# Patient Record
Sex: Male | Born: 1955
Health system: Southern US, Community
[De-identification: ages and names within clinical notes are randomized; demographics above are authoritative.]

## PROBLEM LIST (undated history)

## (undated) DIAGNOSIS — E785 Hyperlipidemia, unspecified: Secondary | ICD-10-CM

## (undated) DIAGNOSIS — S8290XA Unspecified fracture of unspecified lower leg, initial encounter for closed fracture: Secondary | ICD-10-CM

## (undated) DIAGNOSIS — Z2820 Immunization not carried out because of patient decision for unspecified reason: Secondary | ICD-10-CM

## (undated) DIAGNOSIS — Z5320 Procedure and treatment not carried out because of patient's decision for unspecified reasons: Secondary | ICD-10-CM

## (undated) DIAGNOSIS — C9000 Multiple myeloma not having achieved remission: Secondary | ICD-10-CM

## (undated) DIAGNOSIS — J189 Pneumonia, unspecified organism: Secondary | ICD-10-CM

## (undated) DIAGNOSIS — I10 Essential (primary) hypertension: Secondary | ICD-10-CM

## (undated) HISTORY — DX: Unspecified fracture of unspecified lower leg, initial encounter for closed fracture: S82.90XA

## (undated) HISTORY — DX: Hyperlipidemia, unspecified: E78.5

## (undated) HISTORY — DX: Immunization not carried out because of patient decision for unspecified reason: Z28.20

## (undated) HISTORY — PX: COLONOSCOPY: SHX174

## (undated) HISTORY — DX: Essential (primary) hypertension: I10

## (undated) HISTORY — DX: Procedure and treatment not carried out because of patient's decision for unspecified reasons: Z53.20

## (undated) HISTORY — DX: Multiple myeloma not having achieved remission: C90.00

## (undated) HISTORY — DX: Pneumonia, unspecified organism: J18.9

---

## 2017-04-11 ENCOUNTER — Ambulatory Visit (INDEPENDENT_AMBULATORY_CARE_PROVIDER_SITE_OTHER): Payer: No Typology Code available for payment source | Admitting: Medical

## 2017-04-11 ENCOUNTER — Encounter: Payer: Self-pay | Admitting: Medical

## 2017-04-11 VITALS — BP 138/76 | HR 52 | Ht 68.0 in | Wt 186.6 lb

## 2017-04-11 DIAGNOSIS — E785 Hyperlipidemia, unspecified: Secondary | ICD-10-CM | POA: Diagnosis not present

## 2017-04-11 DIAGNOSIS — E559 Vitamin D deficiency, unspecified: Secondary | ICD-10-CM | POA: Diagnosis not present

## 2017-04-11 DIAGNOSIS — I10 Essential (primary) hypertension: Secondary | ICD-10-CM

## 2017-04-11 MED ORDER — AMLODIPINE BESYLATE 10 MG PO TABS: 10.0000 mg | ORAL_TABLET | Freq: Every day | ORAL | 1 refills | Status: DC

## 2017-04-11 MED ORDER — METOPROLOL TARTRATE 100 MG PO TABS: 100.0000 mg | ORAL_TABLET | Freq: Two times a day (BID) | ORAL | 1 refills | Status: DC
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
## 2017-04-12 LAB — CBC
HEMATOCRIT: 42.2 % (ref 38.5–50.0)
HEMOGLOBIN: 14.5 g/dL (ref 13.2–17.1)
MCH: 27.2 pg (ref 27.0–33.0)
MCHC: 34.4 g/dL (ref 32.0–36.0)
MCV: 79.2 fL — AB (ref 80.0–100.0)
MPV: 12.2 fL (ref 7.5–12.5)
Platelets: 226 10*3/uL (ref 140–400)
RBC: 5.33 10*6/uL (ref 4.20–5.80)
RDW: 13.3 % (ref 11.0–15.0)
WBC: 10.1 10*3/uL (ref 3.8–10.8)

## 2017-04-12 LAB — COMPREHENSIVE METABOLIC PANEL
AG RATIO: 1.4 (calc) (ref 1.0–2.5)
ALT: 13 U/L (ref 9–46)
AST: 16 U/L (ref 10–35)
Albumin: 4 g/dL (ref 3.6–5.1)
Alkaline phosphatase (APISO): 79 U/L (ref 40–115)
BUN: 15 mg/dL (ref 7–25)
CO2: 23 mmol/L (ref 20–32)
CREATININE: 1.03 mg/dL (ref 0.70–1.25)
Calcium: 8.6 mg/dL (ref 8.6–10.3)
Chloride: 102 mmol/L (ref 98–110)
GLUCOSE: 124 mg/dL — AB (ref 65–99)
Globulin: 2.9 g/dL (calc) (ref 1.9–3.7)
Potassium: 4.2 mmol/L (ref 3.5–5.3)
Sodium: 138 mmol/L (ref 135–146)
TOTAL PROTEIN: 6.9 g/dL (ref 6.1–8.1)
Total Bilirubin: 1.8 mg/dL — ABNORMAL HIGH (ref 0.2–1.2)

## 2017-04-12 LAB — VITAMIN D 25 HYDROXY (VIT D DEFICIENCY, FRACTURES): VIT D 25 HYDROXY: 45 ng/mL (ref 30–100)

## 2017-04-12 LAB — HEMOGLOBIN A1C
EAG (MMOL/L): 6.3 (calc)
Hgb A1c MFr Bld: 5.6 % of total Hgb (ref <5.7)
MEAN PLASMA GLUCOSE: 114 (calc)

## 2017-04-15 ENCOUNTER — Telehealth: Payer: Self-pay

## 2017-04-15 NOTE — Telephone Encounter (Signed)
Pt was notified of lab results. Pt said that he will call back to make an appt  For appt.

## 2017-09-04 ENCOUNTER — Other Ambulatory Visit: Payer: Self-pay | Admitting: Medical

## 2017-09-13 DIAGNOSIS — Z5320 Procedure and treatment not carried out because of patient's decision for unspecified reasons: Secondary | ICD-10-CM

## 2017-09-13 HISTORY — DX: Procedure and treatment not carried out because of patient's decision for unspecified reasons: Z53.20

## 2017-10-02 ENCOUNTER — Other Ambulatory Visit: Payer: Self-pay | Admitting: Medical

## 2017-10-02 NOTE — Telephone Encounter (Signed)
LOV-04/11/17, no other appointments.  Okay to refill?  Thanks!

## 2017-10-02 NOTE — Telephone Encounter (Signed)
Send in refill but get him in for physical visit .

## 2017-10-03 ENCOUNTER — Telehealth: Payer: Self-pay

## 2017-10-03 NOTE — Telephone Encounter (Signed)
Called and advised patient to keep next appointment for further refills on his medications.

## 2017-10-10 ENCOUNTER — Ambulatory Visit: Payer: Commercial Managed Care - PPO | Admitting: Medical

## 2017-10-10 ENCOUNTER — Encounter: Payer: Self-pay | Admitting: Medical

## 2017-10-10 VITALS — BP 160/88 | Temp 97.7°F | Wt 191.0 lb

## 2017-10-10 DIAGNOSIS — I10 Essential (primary) hypertension: Secondary | ICD-10-CM | POA: Diagnosis not present

## 2017-10-10 DIAGNOSIS — Z2820 Immunization not carried out because of patient decision for unspecified reason: Secondary | ICD-10-CM | POA: Diagnosis not present

## 2017-10-10 DIAGNOSIS — Z5320 Procedure and treatment not carried out because of patient's decision for unspecified reasons: Secondary | ICD-10-CM | POA: Diagnosis not present

## 2017-10-10 MED ORDER — AMLODIPINE BESYLATE 10 MG PO TABS: 10.0000 mg | ORAL_TABLET | Freq: Every day | ORAL | 3 refills | Status: DC

## 2017-10-10 MED ORDER — METOPROLOL TARTRATE 100 MG PO TABS: 100.0000 mg | ORAL_TABLET | Freq: Two times a day (BID) | ORAL | 3 refills | Status: DC

## 2017-10-10 NOTE — Progress Notes (Signed)
Subjective: Chief Complaint  Patient presents with  . Medication Management   Here for med check.  Irritated he has to be here today.  Was upset that nurse checked his BP in both arms, used a curse word.  He is feeling fine, no chest pain, no dyspnea, no edema, no paresthesia.  No c/o.  He denies eating junk food.  Eats variety of fruits and vegetables.   Limits salt, doesn't drink or smoke  He declines screenings, vaccines.    He reports that if he were to get cancer or die of heart attack that is ok as he knows he would go to heaven to meet the [place].  Td less than 10 years ago.  No other aggravating or relieving factors.    No other c/o.  The following portions of the patient's history were reviewed and updated as appropriate: allergies, current medications, past family history, past medical history, past social history, past surgical history and problem list.  ROS Otherwise as in subjective above  Past Medical History:  Diagnosis Date  . Hypertension    No current outpatient medications on file prior to visit.   No current facility-administered medications on file prior to visit.      Objective: Temp 97.7 F (36.5 C) (Oral)   Wt 191 lb (86.6 kg)   BMI 29.04 kg/m   General appearance: alert, no distress, well developed, well nourished Declines exam     Assessment: Encounter Diagnoses  Name Primary?  . Essential hypertension, benign Yes  . Vaccine refused by patient   . Colonoscopy refused   . Drug therapy management recommendation refused by patient      Plan: HTN - continue current medication, refilled year's worth of medication at his request.  Reviewed labs from 03/2017. Counseled on vaccines, cancer screening, benefits of preventive care.  He refuses all vaccines recommended including flu, pneumococcal, shingles.  He does note being up to date on Td vaccine He refuses cancer screening. He refuses to come in for more frequent visits or follow ups.    He strongly wants to come in yearly for BP medication refills without a whole lot of other test or things being done. We discussed his views on medical care and he is content with whatever issues may arise as we move forwards.   I advised that cancer screening could allow [place] to catch something early enough to treat, but he doesn't want to do screenings.  I advised that its up to him to agree or not agree with the recommendations of care.  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  He reports home readings as being normal.  He otherwise voices no desire for any significant screening or intervention.     [name] was seen today for medication management.  Diagnoses and all orders for this visit:  Essential hypertension, benign  Vaccine refused by patient  Colonoscopy refused  Drug therapy management recommendation refused by patient  Other orders -     amLODipine (NORVASC) 10 MG tablet; Take 1 tablet (10 mg total) by mouth daily. -     metoprolol tartrate (LOPRESSOR) 100 MG tablet; Take 1 tablet (100 mg total) by mouth 2 (two) times daily.   Follow up: 1 year for physical

## 2018-04-25 ENCOUNTER — Ambulatory Visit: Payer: Commercial Managed Care - PPO | Admitting: Medical

## 2018-04-25 ENCOUNTER — Encounter: Payer: Self-pay | Admitting: Medical

## 2018-04-25 VITALS — BP 150/72

## 2018-04-25 DIAGNOSIS — I10 Essential (primary) hypertension: Secondary | ICD-10-CM | POA: Diagnosis not present

## 2018-04-25 DIAGNOSIS — Z9119 Patient's noncompliance with other medical treatment and regimen: Secondary | ICD-10-CM

## 2018-04-25 DIAGNOSIS — Z5320 Procedure and treatment not carried out because of patient's decision for unspecified reasons: Secondary | ICD-10-CM

## 2018-04-25 DIAGNOSIS — Z91199 Patient's noncompliance with other medical treatment and regimen due to unspecified reason: Secondary | ICD-10-CM

## 2018-04-25 NOTE — Progress Notes (Signed)
Subjective: Chief Complaint  Patient presents with  . HTN    discuss htn    patient refused vital signs   Here for follow-up on blood pressure medication.  He refused vital signs today.  See last visit 10/10/2017 for similar tense visit  Of note, last visit in March 2019 he did not want to come in except for once yearly for blood pressure medicine refills.  He refused general vaccines at that time, refused cancer screening, refused other evaluation.  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  Here for recheck on BP.  Went for DOT physical yesterday, given 3 month extension due to elevated BP.  He has paperwork with him.   HTN - Currently taking Amlodipine 10mg  daily, and taking Metoprolol 100mg  BID.  Checks BPs on Sundays, usually 130/70.    Has his own cuff.  No CP, no dyspna.   No problems, doing fine.   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  He outworked 2 teenagers.   He denies any DOE.   Walks every morning for exercise.  No other c/o.    Past Medical History:  Diagnosis Date  . Colonoscopy refused 09/2017  . Hypertension   . Refuses treatment 09/2017   refuses screenings such as cancer screening, refuses vaccines, refuses normal preventative care  . Vaccine refused by patient    all vaccines as of 09/2017   Current Outpatient Medications on File Prior to Visit  Medication Sig Dispense Refill  . amLODipine (NORVASC) 10 MG tablet Take 1 tablet (10 mg total) by mouth daily. 90 tablet 3  . metoprolol tartrate (LOPRESSOR) 100 MG tablet Take 1 tablet (100 mg total) by mouth 2 (two) times daily. 180 tablet 3   No current facility-administered medications on file prior to visit.    ROS as in subjective   Objective Refused vital signs initially. BP (!) 150/72   I personally measured his blood pressure in both arms, left arm 152/78, left arm, a little bit later in the visit 150/72 right arm  Gen:  wd, wn, nad Heart rrr, normal s1, s2, no murmurs lungs clear Ext: no edema Pulses 2+ symmetric Psych: He has somewhat of a bad attitude, somewhat irritated with the CMA's questions, refused vitals, and he cursed and seemed to be upset we talked about evaluation and recommendations     Assessment: Encounter Diagnoses  Name Primary?  . Essential hypertension, benign Yes  . Noncompliance   . Refused assessment of physical health      Plan: We discussed his concerns, his 8-month DOT temporary car that runs out in early January, we discussed his disdain for evaluation and follow-up.  We had a hard time at the last appointment as well.  He was not agreeable to normal acceptable preventative measures and evaluation and recommendations last visit nor this visit.  He seemed to want me to just sign off his blood pressure was fine that he can get his DOT clearance without any type of evaluation including vital signs whatsoever  I advised that this is not appropriate medical care.  I advised that at a very minimum he should have his kidney markers checked every year, and that we should have a baseline EKG given what may be uncontrolled high blood pressure.  He certainly has never seen a cardiologist nor has any desire to at this time.  He also understands that  if he does not have his blood pressure under control his DOT certificate could be declined  He was not agreeable to add medication today to the 2 medicines he is taking.  I did try to counsel on diet and exercise and low-salt diet.  Interesting before he came in here today knowing he would have a blood pressure check he ate a "hungry man meal" at a local diner including, sausage, country ham, 2 pancakes, 3 eggs.  To come up to some agreeable plan moving forward, I advised he return in a few weeks with his blood pressure cuff so we can compare cuffs here to make sure his is accurate, advised he check his blood pressures 2 or 3 times per week,  write them down and actually bring the recordings such as on the paper here next visit.  I advised that if I have no tangible data in front of me I cannot sign off on his blood pressures.    If his pressures continue to run in the current range we will need to add a third medication.  I checked both arms today and both times he was around the 622 systolic range but normal diastolic range.  This was consistent with blood pressure reading at occupational health a few days ago his DOT physical.  I also advised that if we cannot work together to find some kind of agreement going forward on evaluation and treatment, then it  would be awfully hard to take care of him  Paul Norton was seen today for htn.  Diagnoses and all orders for this visit:  Essential hypertension, benign  Noncompliance  Refused assessment of physical health

## 2018-04-25 NOTE — Patient Instructions (Addendum)
Recommendations  I know it is frustrating to you that your blood pressure is high when you go in for the DOT physical but let us work together with the following recommendations to move forward   Please come back in and see the nurse within 3 to 4 weeks.  At that time bring your blood pressure cuff so we can compare your readings with our readings  At that time bring a list of your blood pressure readings from home.  I recommend you check your blood pressure 2 or 3 days/week and write these numbers down so we can review these.  If I have your blood pressure readings I can put them in the chart, but if you do not bring them is hard to comment on just your verbal word about your blood pressures  I will need to do an EKG on you at some point in the next year.  This is a heart disease screening test.  It is impossible for me to fully take care of you or give advice on your blood pressure heart without doing this test occasionally.  It does not have to be done every year  Likewise you need to have your kidneys checked every year.  This is a blood test to check to see if your kidney function is working okay.  Blood pressure can harm the kidneys as well as the heart.  Going forward I am not asking a lot for you but if you can simply agree to checking your kidneys periodically or checking an EKG when needed then is will be impossible to help treat your blood pressure issues   If your blood pressures are not at goal, less than 140 top number, less than 90 bottom number upon recheck over the next month or 2, then I will need to add a third blood pressure pill

## 2018-06-18 ENCOUNTER — Other Ambulatory Visit: Payer: Self-pay | Admitting: Medical

## 2018-06-18 ENCOUNTER — Encounter: Payer: Self-pay | Admitting: Medical

## 2018-06-18 ENCOUNTER — Ambulatory Visit: Payer: Commercial Managed Care - PPO | Admitting: Medical

## 2018-06-18 VITALS — HR 50 | Temp 97.4°F | Resp 16 | Ht 71.0 in | Wt 190.8 lb

## 2018-06-18 DIAGNOSIS — Z9119 Patient's noncompliance with other medical treatment and regimen: Secondary | ICD-10-CM | POA: Diagnosis not present

## 2018-06-18 DIAGNOSIS — I10 Essential (primary) hypertension: Secondary | ICD-10-CM

## 2018-06-18 DIAGNOSIS — Z91199 Patient's noncompliance with other medical treatment and regimen due to unspecified reason: Secondary | ICD-10-CM

## 2018-06-18 DIAGNOSIS — Z2820 Immunization not carried out because of patient decision for unspecified reason: Secondary | ICD-10-CM | POA: Diagnosis not present

## 2018-06-18 NOTE — Progress Notes (Signed)
error 

## 2018-06-18 NOTE — Progress Notes (Signed)
Subjective: Chief Complaint  Patient presents with  . follow up HTN    follow up HTN    Here for follow-up on blood pressure medication.  He refused BP check by [place] today.  At his recent visit in October for blood pressure check he had been given a 17-month extension by another office for DOT certification to give him a chance to get blood pressure under control.  At his last visit I asked him to bring his cuff which he has today.  He gave me a list of his home readings most of which are within goal less than 140/90.  There are a few outliers that are in the 150/80 range but most are under 140/90.  He denies chest pain, shortness of breath, dyspnea, edema.  He reports compliance with medication.   Currently taking Amlodipine 10mg  daily, and taking Metoprolol 100mg  BID.  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  Even brags about out working all of his younger counterparts.  As prior visits, he complains that he is stressed free and doing fine all throughout the year except in September when he has to come for his DOT certification.  He feels that he has no control however his blood pressure whether or not he passes the DOT which gets him upset.  He also reports whitecoat hypertension.  He reports that he only gets high readings when he comes into the doctor's office.  He denies being overly anxious or angry.  He does not smoke or drink.  [place]  [place]  He voices his frustration that he has to come in here and do this once a year.  No other c/o.    Past Medical History:  Diagnosis Date  . Colonoscopy refused 09/2017  . Hypertension   . Refuses treatment 09/2017   refuses screenings such as cancer screening, refuses vaccines, refuses normal preventative care  . Vaccine refused by patient    all vaccines as of 09/2017   Current Outpatient Medications on File Prior to Visit  Medication Sig Dispense Refill  .  amLODipine (NORVASC) 10 MG tablet Take 1 tablet (10 mg total) by mouth daily. 90 tablet 3  . metoprolol tartrate (LOPRESSOR) 100 MG tablet Take 1 tablet (100 mg total) by mouth 2 (two) times daily. 180 tablet 3   No current facility-administered medications on file prior to visit.    ROS as in subjective   Objective Refused vital signs initially. Pulse (!) 50   Temp (!) 97.4 F (36.3 C) (Oral)   Resp 16   Ht 5\' 11"  (1.803 m)   Wt 190 lb 12.8 oz (86.5 kg)   SpO2 96%   BMI 26.61 kg/m   Gen: wd, wn, nad Declines other exam today    Assessment: Encounter Diagnoses  Name Primary?  . Essential hypertension, benign Yes  . Vaccine refused by patient   . Noncompliance      Plan: See recent prior visit notes for additional history and tone of the visit.  He was at least agreeable to some blood work today to check the kidneys.  Counseled on diet and exercise, counseled on compliance of medications.  I reviewed his recent blood pressure readings which are mostly within normal range.  I wrote a letter on his behalf to take for DOT clearance through the doctor he got his DOT extension from recently.  In that letter I  did write that his blood pressures at home have been monitored and are normal.  We also called his pharmacy to make sure he is in fact compliant with picking up his medications.  I recommend he try and practice some deep breathing and relaxation techniques before coming into this office for blood pressure check.  I do feel as though before the next year's clearance he will need to have a baseline evaluation with cardiology just to make sure there are no abnormalities that need to be addressed such as on echocardiogram.  He is not really agreeable to much evaluation.  It was hard just to get him to agree to lab work today.  He refused EKG today.  He seems to get worked up and anxious when discussing any type of other evaluation or even discussing the fact that he has a come  in for this visit.  I will send him a letter in writing after today's visit outlining that we will need to have him see cardiology next year before I will sign off on his behalf given the uncontrolled BP readings we get here.  At his last visit here he was not agreeable to any medication modification  Paul Norton was seen today for follow up htn.  Diagnoses and all orders for this visit:  Essential hypertension, benign -     Basic metabolic panel -     Lipid panel  Vaccine refused by patient  Noncompliance

## 2018-06-19 ENCOUNTER — Other Ambulatory Visit: Payer: Self-pay | Admitting: Medical

## 2018-06-19 LAB — BASIC METABOLIC PANEL
BUN / CREAT RATIO: 21 (ref 10–24)
BUN: 24 mg/dL (ref 8–27)
CALCIUM: 9.7 mg/dL (ref 8.6–10.2)
CHLORIDE: 100 mmol/L (ref 96–106)
CO2: 24 mmol/L (ref 20–29)
Creatinine, Ser: 1.16 mg/dL (ref 0.76–1.27)
GFR calc Af Amer: 78 mL/min/{1.73_m2} (ref >59)
GFR calc non Af Amer: 68 mL/min/{1.73_m2} (ref >59)
GLUCOSE: 120 mg/dL — AB (ref 65–99)
Potassium: 4.5 mmol/L (ref 3.5–5.2)
Sodium: 141 mmol/L (ref 134–144)

## 2018-06-19 LAB — LIPID PANEL
Chol/HDL Ratio: 4.6 ratio (ref 0.0–5.0)
Cholesterol, Total: 183 mg/dL (ref 100–199)
HDL: 40 mg/dL
LDL Calculated: 120 mg/dL — ABNORMAL HIGH (ref 0–99)
Triglycerides: 113 mg/dL (ref 0–149)
VLDL Cholesterol Cal: 23 mg/dL (ref 5–40)

## 2018-06-19 MED ORDER — ASPIRIN EC 81 MG PO TBEC
81.0000 mg | DELAYED_RELEASE_TABLET | Freq: Every day | ORAL | 1 refills | Status: DC
Start: 2018-06-19 — End: 2019-06-15

## 2018-06-19 MED ORDER — ROSUVASTATIN CALCIUM 10 MG PO TABS: 10.0000 mg | ORAL_TABLET | Freq: Every day | ORAL | 3 refills | Status: DC

## 2018-10-03 ENCOUNTER — Other Ambulatory Visit: Payer: Self-pay | Admitting: Medical

## 2019-02-05 ENCOUNTER — Other Ambulatory Visit: Payer: Self-pay | Admitting: Medical

## 2019-02-06 NOTE — Telephone Encounter (Signed)
Are these ok to refill? 

## 2019-05-09 ENCOUNTER — Other Ambulatory Visit: Payer: Self-pay | Admitting: Medical

## 2019-06-10 ENCOUNTER — Other Ambulatory Visit: Payer: Self-pay | Admitting: Medical

## 2019-06-15 ENCOUNTER — Ambulatory Visit: Payer: Commercial Managed Care - PPO | Admitting: Medical

## 2019-06-15 ENCOUNTER — Other Ambulatory Visit: Payer: Self-pay

## 2019-06-15 ENCOUNTER — Encounter: Payer: Self-pay | Admitting: Medical

## 2019-06-15 VITALS — Ht 71.0 in | Wt 186.0 lb

## 2019-06-15 DIAGNOSIS — I10 Essential (primary) hypertension: Secondary | ICD-10-CM | POA: Diagnosis not present

## 2019-06-15 DIAGNOSIS — E785 Hyperlipidemia, unspecified: Secondary | ICD-10-CM | POA: Diagnosis not present

## 2019-06-15 DIAGNOSIS — G8929 Other chronic pain: Secondary | ICD-10-CM | POA: Insufficient documentation

## 2019-06-15 DIAGNOSIS — M25511 Pain in right shoulder: Secondary | ICD-10-CM

## 2019-06-15 DIAGNOSIS — M791 Myalgia, unspecified site: Secondary | ICD-10-CM | POA: Insufficient documentation

## 2019-06-15 DIAGNOSIS — M25512 Pain in left shoulder: Secondary | ICD-10-CM

## 2019-06-15 MED ORDER — ASPIRIN EC 81 MG PO TBEC: 81.0000 mg | DELAYED_RELEASE_TABLET | Freq: Every day | ORAL | 3 refills | Status: DC

## 2019-06-15 MED ORDER — METOPROLOL TARTRATE 100 MG PO TABS: ORAL_TABLET | ORAL | 0 refills | Status: DC

## 2019-06-15 MED ORDER — AMLODIPINE BESYLATE 10 MG PO TABS: 10.0000 mg | ORAL_TABLET | Freq: Every day | ORAL | 0 refills | Status: DC

## 2019-06-15 MED ORDER — ROSUVASTATIN CALCIUM 10 MG PO TABS: 10.0000 mg | ORAL_TABLET | Freq: Every day | ORAL | 0 refills | Status: DC

## 2019-06-15 NOTE — Progress Notes (Signed)
Subjective:     Patient ID: Paul Norton, male   DOB: [birth year], 63 y.o.   MRN: [id number]  This visit type was conducted due to national recommendations for restrictions regarding the COVID-19 Pandemic (e.g. social distancing) in an effort to limit this patient's exposure and mitigate transmission in our community.  Due to their co-morbid illnesses, this patient is at least at moderate risk for complications without adequate follow up.  This format is felt to be most appropriate for this patient at this time.    Documentation for virtual audio and video telecommunications through Zoom encounter:  The patient was located at home. The provider was located in the office. The patient did consent to this visit and is aware of possible charges through their insurance for this visit.  The other persons participating in this telemedicine service were none. Time spent on call was 20 minutes and in review of previous records 20 minutes total.  This virtual service is not related to other E/M service within previous 7 days.   HPI Chief Complaint  Patient presents with  . Medication Management   Virtual consult today for med check.  He has a history of hypertension, hyperlipidemia, whitecoat hypertension in the office setting.  At his last visit in December 2019 he got all been out of shape about his blood pressure and coming into the office as his pressure would be high.  Recently he says he has been doing fine.  He says he is compliant with his medications.  He actually ran out of medications for 3 days recently and felt great as he thinks some of his medicines causes aches and pains in his body.  He had a DOT physical within the past month that he was able to pass.  He did this at urgent care up in [facility] close to the [facility].  He denies chest pain, difficulty breathing, palpitations, edema.  He continues to chop wood daily and walks for exercise on his property.   Very active.  In general tries to eat a healthy diet.  He does get some hot dogs and butter in his diet.    In general he does have a history of chronic shoulder pain bilaterally.  Had some injuries in high school to his shoulders and does get some pains from time to time.  So he is not sure if the pains he gets in his body is related to chronic shoulder pain or related to cholesterol medicine.  No other aggravating or relieving factors. No other complaint.  Past Medical History:  Diagnosis Date  . Colonoscopy refused 09/2017  . Hypertension   . Refuses treatment 09/2017   refuses screenings such as cancer screening, refuses vaccines, refuses normal preventative care  . Vaccine refused by patient    all vaccines as of 09/2017   No current outpatient medications on file prior to visit.   No current facility-administered medications on file prior to visit.     Review of Systems As in subjective    Objective:   Physical Exam Due to coronavirus pandemic stay at home measures, patient visit was virtual and they were not examined in person.   Ht 5\' 11"  (1.803 m)   Wt 186 lb (84.4 kg)   BMI 25.94 kg/m       Assessment:     Encounter Diagnoses  Name Primary?  . Essential hypertension, benign Yes  . Hyperlipidemia, unspecified hyperlipidemia type   . Chronic pain of both  shoulders   . Myalgia   . White coat syndrome with diagnosis of hypertension        Plan:     Hypertension-continue medications below for blood pressure.  In December 2019 I advise a baseline cardiology consult since he has not had a cardiology consult in general.  He has not really given any input on this.  Fortunately he is not having any symptoms.  He was able to pass his recent DOT certification up in [facility].  I still recommend a baseline cardiology consult given his risk factors and age.  Hyperlipidemia-I advised that he could stop his statin for 2 weeks and see if he notes any change in  myalgias.  If so then he is supposed to call back so we can discuss this further.  If not then I advise he continue his statin.  Bilateral shoulder pain-he can return at his convenience in person for further evaluation  Paul Norton was seen today for medication management.  Diagnoses and all orders for this visit:  Essential hypertension, benign  Hyperlipidemia, unspecified hyperlipidemia type  Chronic pain of both shoulders  Myalgia  White coat syndrome with diagnosis of hypertension  Other orders -     metoprolol tartrate (LOPRESSOR) 100 MG tablet; TAKE ONE TABLET BY MOUTH TWICE DAILY -     amLODipine (NORVASC) 10 MG tablet; Take 1 tablet (10 mg total) by mouth daily. -     aspirin EC 81 MG tablet; Take 1 tablet (81 mg total) by mouth daily. -     rosuvastatin (CRESTOR) 10 MG tablet; Take 1 tablet (10 mg total) by mouth at bedtime.   F/u with 1-2 wk call back

## 2019-10-12 ENCOUNTER — Other Ambulatory Visit: Payer: Self-pay | Admitting: Medical

## 2020-01-19 ENCOUNTER — Other Ambulatory Visit: Payer: Self-pay | Admitting: Medical

## 2020-02-24 ENCOUNTER — Other Ambulatory Visit: Payer: Self-pay

## 2020-02-24 MED ORDER — AMLODIPINE BESYLATE 10 MG PO TABS: 10.0000 mg | ORAL_TABLET | Freq: Every day | ORAL | 0 refills | Status: DC

## 2020-02-24 MED ORDER — METOPROLOL TARTRATE 100 MG PO TABS: 100.0000 mg | ORAL_TABLET | Freq: Two times a day (BID) | ORAL | 0 refills | Status: DC

## 2020-03-28 ENCOUNTER — Other Ambulatory Visit: Payer: Self-pay | Admitting: Medical

## 2020-03-31 ENCOUNTER — Encounter: Payer: Self-pay | Admitting: Medical

## 2020-03-31 ENCOUNTER — Ambulatory Visit (INDEPENDENT_AMBULATORY_CARE_PROVIDER_SITE_OTHER): Payer: Commercial Managed Care - PPO | Admitting: Medical

## 2020-03-31 ENCOUNTER — Other Ambulatory Visit: Payer: Self-pay

## 2020-03-31 VITALS — BP 172/70 | HR 58 | Ht 71.0 in | Wt 195.2 lb

## 2020-03-31 DIAGNOSIS — Z9119 Patient's noncompliance with other medical treatment and regimen: Secondary | ICD-10-CM

## 2020-03-31 DIAGNOSIS — Z125 Encounter for screening for malignant neoplasm of prostate: Secondary | ICD-10-CM | POA: Insufficient documentation

## 2020-03-31 DIAGNOSIS — Z91199 Patient's noncompliance with other medical treatment and regimen due to unspecified reason: Secondary | ICD-10-CM

## 2020-03-31 DIAGNOSIS — Z1211 Encounter for screening for malignant neoplasm of colon: Secondary | ICD-10-CM

## 2020-03-31 DIAGNOSIS — I10 Essential (primary) hypertension: Secondary | ICD-10-CM | POA: Diagnosis not present

## 2020-03-31 DIAGNOSIS — Z0000 Encounter for general adult medical examination without abnormal findings: Secondary | ICD-10-CM

## 2020-03-31 DIAGNOSIS — E785 Hyperlipidemia, unspecified: Secondary | ICD-10-CM | POA: Diagnosis not present

## 2020-03-31 DIAGNOSIS — Z2820 Immunization not carried out because of patient decision for unspecified reason: Secondary | ICD-10-CM | POA: Diagnosis not present

## 2020-03-31 DIAGNOSIS — Z131 Encounter for screening for diabetes mellitus: Secondary | ICD-10-CM

## 2020-03-31 MED ORDER — METOPROLOL TARTRATE 100 MG PO TABS: 100.0000 mg | ORAL_TABLET | Freq: Two times a day (BID) | ORAL | 3 refills | Status: DC

## 2020-03-31 MED ORDER — ROSUVASTATIN CALCIUM 10 MG PO TABS: 10.0000 mg | ORAL_TABLET | Freq: Every day | ORAL | 3 refills | Status: DC

## 2020-03-31 MED ORDER — AMLODIPINE BESYLATE 10 MG PO TABS: 10.0000 mg | ORAL_TABLET | Freq: Every day | ORAL | 3 refills | Status: DC

## 2020-03-31 MED ORDER — ASPIRIN EC 81 MG PO TBEC: 81.0000 mg | DELAYED_RELEASE_TABLET | Freq: Every day | ORAL | 3 refills | Status: DC

## 2020-03-31 NOTE — Progress Notes (Signed)
Subjective:   HPI  Vihaan Gloss is a 64 y.o. male who presents for Chief Complaint  Patient presents with  . Annual Exam    not fasting     Patient Care Team: Kura Bethards, Camelia Eng, PA-C as PCP - General (Family Medicine) Sees dentist Sees eye doctor   Concerns: Gets up every morning about 2:30am.   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  With truck driving, carries scrap paper to paper mills, and transports new paper rolls.   Compliant with cholesterol and BP medications.  No chest pain, no dyspnea.    Has white coat hypertension.   Always has high readings at doctors offices.  At home readings are usually 120/70s   Reviewed their medical, surgical, family, social, medication, and allergy history and updated chart as appropriate.  Past Medical History:  Diagnosis Date  . Colonoscopy refused 09/2017  . Hyperlipidemia   . Hypertension   . Refuses treatment 09/2017   refuses screenings such as cancer screening, refuses vaccines, refuses normal preventative care  . Vaccine refused by patient    all vaccines as of 09/2017    Past Surgical History:  Procedure Laterality Date  . COLONOSCOPY     never, declines as of 09/2017    No family history on file.   Current Outpatient Medications:  .  amLODipine (NORVASC) 10 MG tablet, Take 1 tablet (10 mg total) by mouth daily., Disp: 90 tablet, Rfl: 3 .  aspirin EC 81 MG tablet, Take 1 tablet (81 mg total) by mouth daily., Disp: 90 tablet, Rfl: 3 .  metoprolol tartrate (LOPRESSOR) 100 MG tablet, Take 1 tablet (100 mg total) by mouth 2 (two) times daily., Disp: 180 tablet, Rfl: 3 .  rosuvastatin (CRESTOR) 10 MG tablet, Take 1 tablet (10 mg total) by mouth at bedtime., Disp: 90 tablet, Rfl: 3  No Known Allergies     Review of Systems Constitutional: -fever, -chills, -sweats, -unexpected weight change, -decreased appetite, -fatigue Allergy: -sneezing, -itching,  -congestion Dermatology: -changing moles, --rash, -lumps ENT: -runny nose, -ear pain, -sore throat, -hoarseness, -sinus pain, -teeth pain, - ringing in ears, -hearing loss, -nosebleeds Cardiology: -chest pain, -palpitations, -swelling, -difficulty breathing when lying flat, -waking up short of breath Respiratory: -cough, -shortness of breath, -difficulty breathing with exercise or exertion, -wheezing, -coughing up blood Gastroenterology: -abdominal pain, -nausea, -vomiting, -diarrhea, -constipation, -blood in stool, -changes in bowel movement, -difficulty swallowing or eating Hematology: -bleeding, -bruising  Musculoskeletal: -joint aches, -muscle aches, -joint swelling, -back pain, -neck pain, -cramping, -changes in gait Ophthalmology: denies vision changes, eye redness, itching, discharge Urology: -burning with urination, -difficulty urinating, -blood in urine, -urinary frequency, -urgency, -incontinence Neurology: -headache, -weakness, -tingling, -numbness, -memory loss, -falls, -dizziness Psychology: -depressed mood, -agitation, -sleep problems Male GU: no testicular mass, pain, no lymph nodes swollen, no swelling, no rash.     Objective:  BP (!) 172/70   Pulse (!) 58   Ht 5\' 11"  (1.803 m)   Wt 195 lb 3.2 oz (88.5 kg)   SpO2 95%   BMI 27.22 kg/m   Wt Readings from Last 3 Encounters:  03/31/20 195 lb 3.2 oz (88.5 kg)  06/15/19 186 lb (84.4 kg)  06/18/18 190 lb 12.8 oz (86.5 kg)   BP Readings from Last 3 Encounters:  03/31/20 (!) 172/70  04/25/18 (!) 150/72  10/10/17 (!) 160/88    General appearance: alert, no distress, WD/WN, Caucasian male Skin: no  worrisome lesions HEENT, Oral - deferred due to covid precautions Neck: supple, no lymphadenopathy, no thyromegaly, no masses, normal ROM, no bruits Chest: non tender, normal shape and expansion Heart: RRR, normal S1, S2, no murmurs Lungs: CTA bilaterally, no wheezes, rhonchi, or rales Abdomen: +bs, soft, non tender, non  distended, no masses, no hepatomegaly, no splenomegaly, no bruits Back: non tender, normal ROM, no scoliosis Musculoskeletal: upper extremities non tender, no obvious deformity, normal ROM throughout, lower extremities non tender, no obvious deformity, normal ROM throughout Extremities: no edema, no cyanosis, no clubbing Pulses: 2+ symmetric, upper and lower extremities, normal cap refill Neurological: alert, oriented x 3, CN2-12 intact, strength normal upper extremities and lower extremities, sensation normal throughout, DTRs 2+ throughout, no cerebellar signs, gait normal Psychiatric: normal affect, behavior normal, pleasant  GU: declined Rectal: declines    Assessment and Plan :   Encounter Diagnoses  Name Primary?  . Encounter for health maintenance examination in adult Yes  . Essential hypertension, benign   . White coat syndrome with diagnosis of hypertension   . Vaccine refused by patient   . Hyperlipidemia, unspecified hyperlipidemia type   . Screen for colon cancer   . Screening for prostate cancer   . Screening for diabetes mellitus   . Noncompliance     Physical exam - discussed and counseled on healthy lifestyle, diet, exercise, preventative care, vaccinations, sick and well care, proper use of emergency dept and after hours care, and addressed their concerns.    Health screening: See your eye doctor yearly for routine vision care. See your dentist yearly for routine dental care including hygiene visits twice yearly.   Cancer screening Advised colonoscopy.  He will consider but doesn't give consent today  Discussed PSA, prostate exam, and prostate cancer screening risks/benefits.      Vaccinations: Advised yearly influenza vaccine, shingles vaccine, covid vaccine, Td.  He does not consent to vaccines.   Separate significant issues discussed: Continue current medications  Of note, he did agree to labs today, but declined cancer screening, vaccines.  He  usually gives a diatribe about prior anxieties over friends and family that had negative experiences.   This greatly shapes his mistrust of medical establishment and willingness to follow usually prevention guidelines.    He notes home BP readings always normal, but always has high readings in medical offices.    Paul Norton was seen today for annual exam.  Diagnoses and all orders for this visit:  Encounter for health maintenance examination in adult -     Comprehensive metabolic panel -     Hemoglobin A1c -     CBC -     Lipid panel  Essential hypertension, benign  White coat syndrome with diagnosis of hypertension  Vaccine refused by patient  Hyperlipidemia, unspecified hyperlipidemia type -     Lipid panel  Screen for colon cancer  Screening for prostate cancer  Screening for diabetes mellitus -     Hemoglobin A1c  Noncompliance  Other orders -     amLODipine (NORVASC) 10 MG tablet; Take 1 tablet (10 mg total) by mouth daily. -     aspirin EC 81 MG tablet; Take 1 tablet (81 mg total) by mouth daily. -     metoprolol tartrate (LOPRESSOR) 100 MG tablet; Take 1 tablet (100 mg total) by mouth 2 (two) times daily. -     rosuvastatin (CRESTOR) 10 MG tablet; Take 1 tablet (10 mg total) by mouth at bedtime.   Follow-up pending labs,  yearly for physical

## 2020-04-01 LAB — CBC
Hematocrit: 30.7 % — ABNORMAL LOW (ref 37.5–51.0)
Hemoglobin: 10.8 g/dL — ABNORMAL LOW (ref 13.0–17.7)
MCH: 31.6 pg (ref 26.6–33.0)
MCHC: 35.2 g/dL (ref 31.5–35.7)
MCV: 90 fL (ref 79–97)
Platelets: 144 10*3/uL — ABNORMAL LOW (ref 150–450)
RBC: 3.42 x10E6/uL — ABNORMAL LOW (ref 4.14–5.80)
RDW: 14.7 % (ref 11.6–15.4)
WBC: 5.4 10*3/uL (ref 3.4–10.8)

## 2020-04-01 LAB — COMPREHENSIVE METABOLIC PANEL
ALT: 18 IU/L (ref 0–44)
AST: 24 IU/L (ref 0–40)
Albumin/Globulin Ratio: 0.5 — ABNORMAL LOW (ref 1.2–2.2)
Albumin: 3.6 g/dL — ABNORMAL LOW (ref 3.8–4.8)
Alkaline Phosphatase: 101 IU/L (ref 44–121)
BUN/Creatinine Ratio: 15 (ref 10–24)
BUN: 19 mg/dL (ref 8–27)
Bilirubin Total: 0.9 mg/dL (ref 0.0–1.2)
CO2: 25 mmol/L (ref 20–29)
Calcium: 10.1 mg/dL (ref 8.6–10.2)
Chloride: 103 mmol/L (ref 96–106)
Creatinine, Ser: 1.23 mg/dL (ref 0.76–1.27)
GFR calc Af Amer: 72 mL/min/{1.73_m2} (ref >59)
GFR calc non Af Amer: 62 mL/min/{1.73_m2} (ref >59)
Globulin, Total: 6.9 g/dL — ABNORMAL HIGH (ref 1.5–4.5)
Glucose: 131 mg/dL — ABNORMAL HIGH (ref 65–99)
Potassium: 4.5 mmol/L (ref 3.5–5.2)
Sodium: 138 mmol/L (ref 134–144)
Total Protein: 10.5 g/dL (ref 6.0–8.5)

## 2020-04-01 LAB — LIPID PANEL
Chol/HDL Ratio: 5.7 ratio — ABNORMAL HIGH (ref 0.0–5.0)
Cholesterol, Total: 132 mg/dL (ref 100–199)
HDL: 23 mg/dL — ABNORMAL LOW (ref >39)
LDL Chol Calc (NIH): 69 mg/dL (ref 0–99)
Triglycerides: 240 mg/dL — ABNORMAL HIGH (ref 0–149)
VLDL Cholesterol Cal: 40 mg/dL (ref 5–40)

## 2020-04-01 LAB — HEMOGLOBIN A1C
Est. average glucose Bld gHb Est-mCnc: 117 mg/dL
Hgb A1c MFr Bld: 5.7 % — ABNORMAL HIGH (ref 4.8–5.6)

## 2020-04-04 ENCOUNTER — Other Ambulatory Visit: Payer: Self-pay

## 2020-04-04 DIAGNOSIS — Z1211 Encounter for screening for malignant neoplasm of colon: Secondary | ICD-10-CM

## 2020-04-05 LAB — PROTEIN ELECTROPHORESIS
A/G Ratio: 0.7 (ref 0.7–1.7)
Albumin ELP: 4.1 g/dL (ref 2.9–4.4)
Alpha 1: 0.3 g/dL (ref 0.0–0.4)
Alpha 2: 0.7 g/dL (ref 0.4–1.0)
Beta: 1.1 g/dL (ref 0.7–1.3)
Gamma Globulin: 4.1 g/dL — ABNORMAL HIGH (ref 0.4–1.8)
Globulin, Total: 6.3 g/dL — ABNORMAL HIGH (ref 2.2–3.9)
M-Spike, %: 4 g/dL — ABNORMAL HIGH
Total Protein: 10.4 g/dL (ref 6.0–8.5)

## 2020-04-05 LAB — SPECIMEN STATUS REPORT

## 2020-04-06 ENCOUNTER — Other Ambulatory Visit: Payer: Self-pay | Admitting: Medical

## 2020-04-06 ENCOUNTER — Encounter (INDEPENDENT_AMBULATORY_CARE_PROVIDER_SITE_OTHER): Payer: Self-pay | Admitting: *Deleted

## 2020-04-06 DIAGNOSIS — R899 Unspecified abnormal finding in specimens from other organs, systems and tissues: Secondary | ICD-10-CM

## 2020-04-06 DIAGNOSIS — R778 Other specified abnormalities of plasma proteins: Secondary | ICD-10-CM

## 2020-09-06 ENCOUNTER — Other Ambulatory Visit: Payer: Self-pay

## 2020-09-06 ENCOUNTER — Telehealth: Payer: Commercial Managed Care - PPO | Admitting: Medical

## 2020-09-06 DIAGNOSIS — U071 COVID-19: Secondary | ICD-10-CM | POA: Diagnosis not present

## 2020-09-06 DIAGNOSIS — R0609 Other forms of dyspnea: Secondary | ICD-10-CM | POA: Insufficient documentation

## 2020-09-06 DIAGNOSIS — R778 Other specified abnormalities of plasma proteins: Secondary | ICD-10-CM

## 2020-09-06 DIAGNOSIS — R4589 Other symptoms and signs involving emotional state: Secondary | ICD-10-CM | POA: Insufficient documentation

## 2020-09-06 DIAGNOSIS — R0600 Dyspnea, unspecified: Secondary | ICD-10-CM | POA: Insufficient documentation

## 2020-09-06 DIAGNOSIS — R059 Cough, unspecified: Secondary | ICD-10-CM | POA: Insufficient documentation

## 2020-09-06 DIAGNOSIS — F4321 Adjustment disorder with depressed mood: Secondary | ICD-10-CM

## 2020-09-06 MED ORDER — AZITHROMYCIN 250 MG PO TABS: ORAL_TABLET | ORAL | 0 refills | Status: DC

## 2020-09-06 MED ORDER — PREDNISONE 10 MG PO TABS: ORAL_TABLET | ORAL | 0 refills | Status: DC

## 2020-09-06 MED ORDER — FLUTICASONE FUROATE-VILANTEROL 200-25 MCG/INH IN AEPB: 1.0000 | INHALATION_SPRAY | Freq: Every day | RESPIRATORY_TRACT | 1 refills | Status: DC

## 2020-09-06 MED ORDER — PROMETHAZINE-DM 6.25-15 MG/5ML PO SYRP: 5.0000 mL | ORAL_SOLUTION | Freq: Four times a day (QID) | ORAL | 0 refills | Status: DC | PRN

## 2020-09-06 MED ORDER — EMERGEN-C IMMUNE PLUS PO PACK: 1.0000 | PACK | Freq: Two times a day (BID) | ORAL | 0 refills | Status: DC

## 2020-09-06 NOTE — Progress Notes (Signed)
Subjective:     Patient ID: Paul Norton, male   DOB: 1956-05-14, 65 y.o.   MRN: [id number]  This visit type was conducted due to national recommendations for restrictions regarding the COVID-19 Pandemic (e.g. social distancing) in an effort to limit this patient's exposure and mitigate transmission in our community.  Due to their co-morbid illnesses, this patient is at least at moderate risk for complications without adequate follow up.  This format is felt to be most appropriate for this patient at this time.    Documentation for virtual audio and video telecommunications through [place] encounter:  The patient was located at home. The provider was located in the office. The patient did consent to this visit and is aware of possible charges through their insurance for this visit.  The other persons participating in this telemedicine service were none. Time spent on call was 40 minutes and in review of previous records 50 minutes total.  This virtual service is not related to other E/M service within previous 7 days.   HPI Chief Complaint  Patient presents with  . Cough   (History provider by patient, but he is difficult to get history as he goes on tangents at times, and he doesn't provide good details at times)  Virtual consult for illness, cough, fatigue.    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  Got some cough for several days.   At some point in the coming weeks he was getting up hay on his property and breathed in a lot of straw dust which aggravated his breathing and cough.   At some point was feeling more of an illness or sick feeling.  Within recent weeks, started having burnign feeling in chest, started feeling much worse like breathing fire.  This lasted a few days.  This sensation lasted a few days then quit.  At the time  attributed this to dealing with hay/straw he was getting up at the farm/yard.   Over time was feeling give out, weak, fatigued, joints hurting.   Loss taste and smell.    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  At some point in the last month had positive covid test.  Not exactly sure of the date.  He notes that he was doing so many of these tests he would test and throw them out the window.    Took short term disability due to the fatigue, covid.   Still out of work  Sometime in the past month he passed his DOT physical, BP was looking good.    Now goes days without coughing, but still having cough.  Sometimes getting nausea.   Still has bad taste in mouth, cant taste normally yet, getting some taste and smell back.  At this point is still feeling give out, fatigued,  Even short walks from car to the store entrance can feel exhausted.  Other times feels ok.   Yesterday cleaned the house and felt ok.  Today a little worse.  Not sleeping well.  Sometimes sitting in bed for hours, then finally gets a little sleep.    At one point he broke down crying talking about his parents that both died within a year.   Still has a lot of frustration,  anger, and animosity toward the health care establishment.   Felt like the hospital system and their treatment and recommendations killed his father.  He also notes that his father's life savings were drained from all the hospital bills with his father's issues.   Thus Paul Norton continues to have fear and distrust of the health care system  Dealing with covid and cough, he says he tried to get seen at urgent care and other offices, but states no one would see him.   Past Medical History:  Diagnosis Date  . Colonoscopy refused 09/2017  . Hyperlipidemia   . Hypertension   . Refuses treatment 09/2017   refuses screenings such as cancer screening, refuses vaccines, refuses normal preventative care  . Vaccine refused by  patient    all vaccines as of 09/2017   Current Outpatient Medications on File Prior to Visit  Medication Sig Dispense Refill  . amLODipine (NORVASC) 10 MG tablet Take 1 tablet (10 mg total) by mouth daily. 90 tablet 3  . aspirin EC 81 MG tablet Take 1 tablet (81 mg total) by mouth daily. 90 tablet 3  . metoprolol tartrate (LOPRESSOR) 100 MG tablet Take 1 tablet (100 mg total) by mouth 2 (two) times daily. 180 tablet 3  . rosuvastatin (CRESTOR) 10 MG tablet Take 1 tablet (10 mg total) by mouth at bedtime. 90 tablet 3   No current facility-administered medications on file prior to visit.    Review of Systems As in subjective    Objective:   Physical Exam Due to coronavirus pandemic stay at home measures, patient visit was virtual and they were not examined in person.   There were no vitals taken for this visit.  Gen: nad Answering questions in complete sentences without dyspnea or wheezing Tearful at times     Assessment:     Encounter Diagnoses  Name Primary?  . COVID-19 virus infection Yes  . Cough   . DOE (dyspnea on exertion)   . Abnormal SPEP   . Grieving   . Depressed mood        Plan:      We discussed his current symptoms, treatment recommendations, expectations in terms of how long will take to improve which could be weeks or longer.  I encouraged him to go for chest x-ray and labs either [facility] or at the [facility] center in [facility].  I sent a fax order to [facility], and [facility] can see the order electronically already.  He is unlikely to go based on prior reluctance to go for testing.  He has a strong distrust of the medical establishment due to prior situation with his parents including the ultimate death of his father.  I am mailing him his summary below since he doesn't utilize email.  I asked him to establish with counseling  I asked him to let me refer to oncology regarding recent abnormal SPEP and other labs, likely  underlying malignancy but he has continued to declined referrals for this.    We discussed the following recommendations:  Patient Instructions  Recommendations:  1.  Covid, cough, fatigue  Begin Breo inhaler, 1 puff daily for help with cough, tightness in chest, difficult breathing.  Rinse out mouth with water after use  Use the Breo inhaler for 1 month for now, but you may end up needing to go longer on the Breo  Begin Prednisone steroid taper, finish the entire dose pack  Begin Zpak antibiotic x 5  days  You can use Promethazine DM cough syrup as needed for cough  Drink 80-100 ounces of water or fluids daily  Begin EmergenC immune plus vitamin pack daily for the next month  If you are agreeable, I can send you for chest xray and labs at [facility] or the [facility] in [place].  Let me know if you decide to do this  Your fatigue symptoms will take weeks to fully resolve!   2. Counseling I strongly recommend you make an appointment with a counselor.   You are still dealing with a lot of emotions regarding the passing of both your parents.  I listed some counselors in your area in [place].    [facility] Address: [address] [place], [place], [place] [address] Phone: [phone]   [facility] Address: [place], [place], [place] [address] Phone: [phone]    [facility] Address: [address] [place], [place] [address] hone: [phone]    3. abnormal labs/underlying medical problem Also, the last time we talked, we discussed your abnormal labs.  I am concerned that you may have underlying disease in your blood or bones.  I want you to see an oncology office.  You declined this the last time we spoke.  I strongly recommend you call one of the offices below for an appointment as soon as possible!   [facility] Address: [place], [place], [place] [address] Phone: [phone]   [facility] Address: [address]  [place], [place] [address] Phone: [phone]    Elizjah was seen today for cough.  Diagnoses and all orders for this visit:  COVID-19 virus infection -     DG Chest 2 View; Future -     CBC with Differential/Platelet; Future  Cough -     DG Chest 2 View; Future -     CBC with Differential/Platelet; Future  DOE (dyspnea on exertion) -     DG Chest 2 View; Future -     CBC with Differential/Platelet; Future  Abnormal SPEP -     DG Chest 2 View; Future -     CBC with Differential/Platelet; Future  Grieving  Depressed mood  Other orders -     promethazine-dextromethorphan (PROMETHAZINE-DM) 6.25-15 MG/5ML syrup; Take 5 mLs by mouth 4 (four) times daily as needed for cough. -     predniSONE (DELTASONE) 10 MG tablet; 6/5/4/3/2/1 taper -     fluticasone furoate-vilanterol (BREO ELLIPTA) 200-25 MCG/INH AEPB; Inhale 1 puff into the lungs daily. -     azithromycin (ZITHROMAX) 250 MG tablet; 2 tablets day 1, then 1 tablet days 2-4 -     Multiple Vitamins-Minerals (EMERGEN-C IMMUNE PLUS) PACK; Take 1 tablet by mouth 2 (two) times daily.  call back within a week, sooner prn

## 2020-09-06 NOTE — Patient Instructions (Addendum)
Recommendations:  1.  Covid, cough, fatigue  Begin Breo inhaler, 1 puff daily for help with cough, tightness in chest, difficult breathing.  Rinse out mouth with water after use  Use the Breo inhaler for 1 month for now, but you may end up needing to go longer on the Breo  Begin Prednisone steroid taper, finish the entire dose pack  Begin Zpak antibiotic x 5 days  You can use Promethazine DM cough syrup as needed for cough  Drink 80-100 ounces of water or fluids daily  Begin EmergenC immune plus vitamin pack daily for the next month  If you are agreeable, I can send you for chest xray and labs at [facility] or the [facility] in [place].  Let me know if you decide to do this  Your fatigue symptoms will take weeks to fully resolve!   2. Counseling I strongly recommend you make an appointment with a counselor.   You are still dealing with a lot of emotions regarding the passing of both your parents.  I listed some counselors in your area in [place].    [facility] Address: [address] [place], [place], [place] [address] Phone: [phone]   [facility] Address: [place], [place], [place] [address] Phone: [phone]    [facility] Address: [address], [place], [place] [address] hone: [phone]    3. abnormal labs/underlying medical problem Also, the last time we talked, we discussed your abnormal labs.  I am concerned that you may have underlying disease in your blood or bones.  I want you to see an oncology office.  You declined this the last time we spoke.  I strongly recommend you call one of the offices below for an appointment as soon as possible!   [facility] Address: [place], [place], [place] [address] Phone: [phone]   [facility] Address: [address] [place], [place],  [address] Phone: [phone]

## 2020-10-29 ENCOUNTER — Inpatient Hospital Stay (HOSPITAL_COMMUNITY)
Admission: EM | Admit: 2020-10-29 | Discharge: 2020-11-09 | DRG: 840 | Disposition: A | Discharge status: Home / Self Care | Payer: Commercial Managed Care - PPO | Attending: Family Medicine | Admitting: Family Medicine

## 2020-10-29 ENCOUNTER — Emergency Department (HOSPITAL_COMMUNITY): Payer: Commercial Managed Care - PPO

## 2020-10-29 ENCOUNTER — Other Ambulatory Visit: Payer: Self-pay

## 2020-10-29 DIAGNOSIS — G9341 Metabolic encephalopathy: Secondary | ICD-10-CM | POA: Diagnosis present

## 2020-10-29 DIAGNOSIS — F32A Depression, unspecified: Secondary | ICD-10-CM | POA: Diagnosis present

## 2020-10-29 DIAGNOSIS — R0609 Other forms of dyspnea: Secondary | ICD-10-CM | POA: Diagnosis present

## 2020-10-29 DIAGNOSIS — E785 Hyperlipidemia, unspecified: Secondary | ICD-10-CM | POA: Diagnosis present

## 2020-10-29 DIAGNOSIS — R0902 Hypoxemia: Secondary | ICD-10-CM | POA: Diagnosis not present

## 2020-10-29 DIAGNOSIS — C9000 Multiple myeloma not having achieved remission: Secondary | ICD-10-CM | POA: Diagnosis not present

## 2020-10-29 DIAGNOSIS — R930 Abnormal findings on diagnostic imaging of skull and head, not elsewhere classified: Secondary | ICD-10-CM | POA: Diagnosis present

## 2020-10-29 DIAGNOSIS — D630 Anemia in neoplastic disease: Secondary | ICD-10-CM | POA: Diagnosis present

## 2020-10-29 DIAGNOSIS — Z79899 Other long term (current) drug therapy: Secondary | ICD-10-CM

## 2020-10-29 DIAGNOSIS — E876 Hypokalemia: Secondary | ICD-10-CM | POA: Diagnosis present

## 2020-10-29 DIAGNOSIS — E861 Hypovolemia: Secondary | ICD-10-CM | POA: Diagnosis not present

## 2020-10-29 DIAGNOSIS — D6959 Other secondary thrombocytopenia: Secondary | ICD-10-CM | POA: Diagnosis present

## 2020-10-29 DIAGNOSIS — Z8616 Personal history of COVID-19: Secondary | ICD-10-CM

## 2020-10-29 DIAGNOSIS — E871 Hypo-osmolality and hyponatremia: Secondary | ICD-10-CM | POA: Diagnosis present

## 2020-10-29 DIAGNOSIS — K625 Hemorrhage of anus and rectum: Secondary | ICD-10-CM | POA: Diagnosis present

## 2020-10-29 DIAGNOSIS — Z7982 Long term (current) use of aspirin: Secondary | ICD-10-CM

## 2020-10-29 DIAGNOSIS — R531 Weakness: Secondary | ICD-10-CM | POA: Diagnosis present

## 2020-10-29 DIAGNOSIS — I21A1 Myocardial infarction type 2: Secondary | ICD-10-CM | POA: Diagnosis not present

## 2020-10-29 DIAGNOSIS — D649 Anemia, unspecified: Secondary | ICD-10-CM

## 2020-10-29 DIAGNOSIS — Z6822 Body mass index (BMI) 22.0-22.9, adult: Secondary | ICD-10-CM

## 2020-10-29 DIAGNOSIS — Z20822 Contact with and (suspected) exposure to covid-19: Secondary | ICD-10-CM | POA: Diagnosis present

## 2020-10-29 DIAGNOSIS — E538 Deficiency of other specified B group vitamins: Secondary | ICD-10-CM | POA: Diagnosis present

## 2020-10-29 DIAGNOSIS — K649 Unspecified hemorrhoids: Secondary | ICD-10-CM | POA: Diagnosis present

## 2020-10-29 DIAGNOSIS — I10 Essential (primary) hypertension: Secondary | ICD-10-CM | POA: Diagnosis present

## 2020-10-29 DIAGNOSIS — N179 Acute kidney failure, unspecified: Secondary | ICD-10-CM | POA: Diagnosis present

## 2020-10-29 DIAGNOSIS — C799 Secondary malignant neoplasm of unspecified site: Secondary | ICD-10-CM

## 2020-10-29 DIAGNOSIS — R0602 Shortness of breath: Secondary | ICD-10-CM

## 2020-10-29 DIAGNOSIS — Z9119 Patient's noncompliance with other medical treatment and regimen: Secondary | ICD-10-CM

## 2020-10-29 DIAGNOSIS — D696 Thrombocytopenia, unspecified: Secondary | ICD-10-CM | POA: Diagnosis present

## 2020-10-29 DIAGNOSIS — E222 Syndrome of inappropriate secretion of antidiuretic hormone: Secondary | ICD-10-CM | POA: Diagnosis present

## 2020-10-29 DIAGNOSIS — Z5320 Procedure and treatment not carried out because of patient's decision for unspecified reasons: Secondary | ICD-10-CM

## 2020-10-29 DIAGNOSIS — R0600 Dyspnea, unspecified: Secondary | ICD-10-CM | POA: Diagnosis present

## 2020-10-29 DIAGNOSIS — K5900 Constipation, unspecified: Secondary | ICD-10-CM | POA: Diagnosis not present

## 2020-10-29 DIAGNOSIS — R54 Age-related physical debility: Secondary | ICD-10-CM | POA: Diagnosis present

## 2020-10-29 DIAGNOSIS — D61818 Other pancytopenia: Secondary | ICD-10-CM | POA: Diagnosis present

## 2020-10-29 DIAGNOSIS — E440 Moderate protein-calorie malnutrition: Secondary | ICD-10-CM | POA: Diagnosis present

## 2020-10-29 DIAGNOSIS — Z91199 Patient's noncompliance with other medical treatment and regimen due to unspecified reason: Secondary | ICD-10-CM

## 2020-10-29 DIAGNOSIS — E860 Dehydration: Secondary | ICD-10-CM | POA: Diagnosis present

## 2020-10-29 DIAGNOSIS — C7951 Secondary malignant neoplasm of bone: Secondary | ICD-10-CM | POA: Diagnosis present

## 2020-10-29 LAB — COMPREHENSIVE METABOLIC PANEL
ALT: 18 U/L (ref 0–44)
AST: 32 U/L (ref 15–41)
Albumin: 2.5 g/dL — ABNORMAL LOW (ref 3.5–5.0)
Alkaline Phosphatase: 64 U/L (ref 38–126)
Anion gap: 7 (ref 5–15)
BUN: 47 mg/dL — ABNORMAL HIGH (ref 8–23)
CO2: 24 mmol/L (ref 22–32)
Calcium: 14.2 mg/dL (ref 8.9–10.3)
Chloride: 96 mmol/L — ABNORMAL LOW (ref 98–111)
Creatinine, Ser: 4.5 mg/dL — ABNORMAL HIGH (ref 0.61–1.24)
GFR, Estimated: 14 mL/min — ABNORMAL LOW (ref >60)
Glucose, Bld: 129 mg/dL — ABNORMAL HIGH (ref 70–99)
Potassium: 3.8 mmol/L (ref 3.5–5.1)
Sodium: 127 mmol/L — ABNORMAL LOW (ref 135–145)
Total Bilirubin: 0.7 mg/dL (ref 0.3–1.2)
Total Protein: 12.4 g/dL — ABNORMAL HIGH (ref 6.5–8.1)

## 2020-10-29 LAB — RETICULOCYTES
Immature Retic Fract: 16.5 % — ABNORMAL HIGH (ref 2.3–15.9)
RBC.: 1.02 MIL/uL — ABNORMAL LOW (ref 4.22–5.81)
Retic Count, Absolute: 12.8 10*3/uL — ABNORMAL LOW (ref 19.0–186.0)
Retic Ct Pct: 1.3 % (ref 0.4–3.1)

## 2020-10-29 LAB — CK: Total CK: 17 U/L — ABNORMAL LOW (ref 49–397)

## 2020-10-29 LAB — AMMONIA: Ammonia: 13 umol/L (ref 9–35)

## 2020-10-29 LAB — TROPONIN I (HIGH SENSITIVITY): Troponin I (High Sensitivity): 12 ng/L (ref <18)

## 2020-10-29 MED ORDER — SODIUM CHLORIDE 0.9 % IV BOLUS
1000.0000 mL | Freq: Once | INTRAVENOUS | Status: AC
  Administered 2020-10-29: 1000 mL via INTRAVENOUS

## 2020-10-29 MED ORDER — SODIUM CHLORIDE 0.9 % IV SOLN
10.0000 mL/h | Freq: Once | INTRAVENOUS | Status: AC
  Administered 2020-10-30: 10 mL/h via INTRAVENOUS

## 2020-10-29 MED ORDER — SODIUM CHLORIDE 0.9 % IV BOLUS: 1000.0000 mL | Freq: Once | INTRAVENOUS | Status: DC

## 2020-10-29 NOTE — ED Triage Notes (Signed)
Pt BIB [facility] from home, reports he had a carbon monoxide exposure 2 months ago and had covid a few weeks ago, c/o AMS. Pt says "my brother was concerned about me not eating, per brother (per EMS) pt has lost around 40lbs

## 2020-10-29 NOTE — ED Notes (Signed)
Patient transported to radiology

## 2020-10-29 NOTE — ED Notes (Signed)
Date and time results received: 10/29/20 2333  Test: Hgb Critical Value: 3.9  Name of Provider Notified: Rancour, MD  Orders Received? Or Actions Taken?: acknowledged

## 2020-10-29 NOTE — ED Provider Notes (Signed)
[facility] EMERGENCY DEPARTMENT Provider Note   CSN: [id number] Arrival date & time: 10/29/20  2201     History Chief Complaint  Patient presents with  . Altered Mental Status    Paul Norton is a 65 y.o. male.  Patient was brought into the emergency department for progressive confusion.  It has gotten much worse since Tuesday.  According to his nephew over for the last 6-week or so it seems like that he has lost 40 pounds and within the last for 5 days has been more confused.  Additional history was that he had car monoxide exposure couple months ago and there is some question whether he had Covid within the last month.  The history is provided by the patient and medical records. No language interpreter was used.  Altered Mental Status Presenting symptoms: behavior changes   Severity:  Moderate Most recent episode:  More than 2 days ago Episode history:  Continuous Timing:  Constant Progression:  Waxing and waning Chronicity:  New Associated symptoms: no abdominal pain, no hallucinations, no headaches, no rash and no seizures        Past Medical History:  Diagnosis Date  . Colonoscopy refused 09/2017  . Hyperlipidemia   . Hypertension   . Refuses treatment 09/2017   refuses screenings such as cancer screening, refuses vaccines, refuses normal preventative care  . Vaccine refused by patient    all vaccines as of 09/2017    Patient Active Problem List   Diagnosis Date Noted  . COVID-19 virus infection 09/06/2020  . Cough 09/06/2020  . DOE (dyspnea on exertion) 09/06/2020  . Abnormal SPEP 09/06/2020  . Grieving 09/06/2020  . Depressed mood 09/06/2020  . Encounter for health maintenance examination in adult 03/31/2020  . Screen for colon cancer 03/31/2020  . Screening for prostate cancer 03/31/2020  . Screening for diabetes mellitus 03/31/2020  . Hyperlipidemia 06/15/2019  . White coat syndrome with diagnosis of hypertension 06/15/2019  . Noncompliance  04/25/2018  . Essential hypertension, benign 10/10/2017  . Vaccine refused by patient 10/10/2017    Past Surgical History:  Procedure Laterality Date  . COLONOSCOPY     never, declines as of 09/2017       No family history on file.  Social History   Tobacco Use  . Smoking status: Never Smoker  . Smokeless tobacco: Never Used  Substance Use Topics  . Alcohol use: No  . Drug use: Never    Home Medications Prior to Admission medications   Medication Sig Start Date End Date Taking? Authorizing Provider  amLODipine (NORVASC) 10 MG tablet Take 1 tablet (10 mg total) by mouth daily. 03/31/20   Tysinger, Camelia Eng, PA-C  aspirin EC 81 MG tablet Take 1 tablet (81 mg total) by mouth daily. 03/31/20   Tysinger, Camelia Eng, PA-C  azithromycin (ZITHROMAX) 250 MG tablet 2 tablets day 1, then 1 tablet days 2-4 09/06/20   Tysinger, Camelia Eng, PA-C  fluticasone furoate-vilanterol (BREO ELLIPTA) 200-25 MCG/INH AEPB Inhale 1 puff into the lungs daily. 09/06/20   Tysinger, Camelia Eng, PA-C  metoprolol tartrate (LOPRESSOR) 100 MG tablet Take 1 tablet (100 mg total) by mouth 2 (two) times daily. 03/31/20   Tysinger, Camelia Eng, PA-C  Multiple Vitamins-Minerals (EMERGEN-C IMMUNE PLUS) PACK Take 1 tablet by mouth 2 (two) times daily. 09/06/20   Tysinger, Camelia Eng, PA-C  predniSONE (DELTASONE) 10 MG tablet 6/5/4/3/2/1 taper 09/06/20   Tysinger, Camelia Eng, PA-C  promethazine-dextromethorphan (PROMETHAZINE-DM) 6.25-15 MG/5ML syrup Take 5 mLs  by mouth 4 (four) times daily as needed for cough. 09/06/20   Tysinger, Camelia Eng, PA-C  rosuvastatin (CRESTOR) 10 MG tablet Take 1 tablet (10 mg total) by mouth at bedtime. 03/31/20 03/31/21  Tysinger, Camelia Eng, PA-C    Allergies    Patient has no known allergies.  Review of Systems   Review of Systems  Unable to perform ROS: Mental status change  Constitutional: Negative for appetite change and fatigue.  HENT: Negative for congestion, ear discharge and sinus pressure.   Eyes: Negative  for discharge.  Respiratory: Negative for cough.   Cardiovascular: Negative for chest pain.  Gastrointestinal: Negative for abdominal pain and diarrhea.  Genitourinary: Negative for frequency and hematuria.  Musculoskeletal: Negative for back pain.  Skin: Negative for rash.  Neurological: Negative for seizures and headaches.  Psychiatric/Behavioral: Negative for hallucinations.    Physical Exam Updated Vital Signs BP 128/60 (BP Location: Right Arm)   Pulse 100   Temp 98.4 F (36.9 C) (Oral)   Resp 20   Ht 5\' 9"  (1.753 m)   Wt 70.4 kg   SpO2 97%   BMI 22.90 kg/m   Physical Exam Vitals and nursing note reviewed.  Constitutional:      Appearance: He is well-developed. He is ill-appearing.  HENT:     Head: Normocephalic.     Nose: Nose normal.     Mouth/Throat:     Comments: Dry mucous membrane Eyes:     General: No scleral icterus.    Conjunctiva/sclera: Conjunctivae normal.  Neck:     Thyroid: No thyromegaly.  Cardiovascular:     Rate and Rhythm: Normal rate and regular rhythm.     Heart sounds: No murmur heard. No friction rub. No gallop.   Pulmonary:     Breath sounds: No stridor. No wheezing or rales.  Chest:     Chest wall: No tenderness.  Abdominal:     General: There is no distension.     Tenderness: There is no abdominal tenderness. There is no rebound.  Musculoskeletal:        General: Normal range of motion.     Cervical back: Neck supple.  Lymphadenopathy:     Cervical: No cervical adenopathy.  Skin:    Findings: No erythema or rash.  Neurological:     Mental Status: He is alert.     Motor: No abnormal muscle tone.     Coordination: Coordination normal.     Comments: Oriented to person and place but unable to give a good history of what is been going on with him.  Patient having some confusion     ED Results / Procedures / Treatments   Labs (all labs ordered are listed, but only abnormal results are displayed) Labs Reviewed  RESP PANEL BY  RT-PCR (FLU A&B, COVID) ARPGX2  CBC WITH DIFFERENTIAL/PLATELET  URINALYSIS, ROUTINE W REFLEX MICROSCOPIC  CBC WITH DIFFERENTIAL/PLATELET  COMPREHENSIVE METABOLIC PANEL  TROPONIN I (HIGH SENSITIVITY)    EKG EKG Interpretation  Date/Time:  Saturday October 29 2020 22:19:40 EDT Ventricular Rate:  96 PR Interval:  127 QRS Duration: 90 QT Interval:  314 QTC Calculation: 397 R Axis:   70 Text Interpretation: Ectopic atrial rhythm Repol abnrm, severe global ischemia (LM/MVD) Confirmed by Milton Ferguson [phone]) on 10/29/2020 10:25:47 PM   Radiology No results found.  Procedures Procedures   Medications Ordered in ED Medications  sodium chloride 0.9 % bolus 1,000 mL (1,000 mLs Intravenous New Bag/Given 10/29/20 2304)    ED Course  I have reviewed the triage vital signs and the nursing notes.  Pertinent labs & imaging results that were available during my care of the patient were reviewed by me and considered in my medical decision making (see chart for details).    MDM Rules/Calculators/A&P    Patient with altered mental status.  Labs pending Dr. Wyvonnia Dusky  will assume care                       Final Clinical Impression(s) / ED Diagnoses Final diagnoses:  None    Rx / DC Orders ED Discharge Orders    None       Milton Ferguson, MD 11/01/20 1103

## 2020-10-29 NOTE — ED Provider Notes (Addendum)
Care assumed from Dr. Roderic Palau.  Patient with history of hypertension and medical noncompliance here with altered mental status.  Progressive confusion for the past 4 to 5 days.  Some concern for possible Covid and possible carbon monoxide exposure several weeks ago.  Patient appears dehydrated.  Dry mucous membranes. Old blood in mouth. Pale appearance. Clear lungs. Soft abdomen.  FOBT without gross blood. Negative.  CT head was concerning for bony lesions consistent multiple myeloma or metastases.  Patient appears pale, dry mucous membranes regular rate and rhythm.  Lungs are clear.  Abdomen is diffusely tender.  Found to have a hemoglobin of 3.9. Hemoccult performed.  Blood transfusion ordered Patient agreeable but unsure he has full comprehension of the situation. D/w nephew Timmy who consents for blood.  Labs concerning for hypercalcemia, AKI, hyponatremia. Carboxyhemoglobin not elevated. EKG with global ST depressions and T wave inversions. Denies chest pain. Troponin negative.  Concern for multiple myeloma versus metastatic cancer disease.  Will obtain screening CTs of chest, abdomen and pelvis. Patient will need admission to the hospital  D/w Dr. Clearence Ped.  Attempted to call patient's nephew with no answer  CRITICAL CARE Performed by: Ezequiel Essex Total critical care time: 60 minutes Critical care time was exclusive of separately billable procedures and treating other patients. Critical care was necessary to treat or prevent imminent or life-threatening deterioration. Critical care was time spent personally by me on the following activities: development of treatment plan with patient and/or surrogate as well as nursing, discussions with consultants, evaluation of patient's response to treatment, examination of patient, obtaining history from patient or surrogate, ordering and performing treatments and interventions, ordering and review of laboratory studies, ordering and  review of radiographic studies, pulse oximetry and re-evaluation of patient's condition.    Ezequiel Essex, MD 10/30/20 0111    Ezequiel Essex, MD 10/30/20 0230

## 2020-10-30 ENCOUNTER — Inpatient Hospital Stay (HOSPITAL_COMMUNITY): Payer: Commercial Managed Care - PPO

## 2020-10-30 ENCOUNTER — Encounter (HOSPITAL_COMMUNITY): Payer: Self-pay | Admitting: Family Medicine

## 2020-10-30 ENCOUNTER — Emergency Department (HOSPITAL_COMMUNITY): Payer: Commercial Managed Care - PPO

## 2020-10-30 DIAGNOSIS — D6959 Other secondary thrombocytopenia: Secondary | ICD-10-CM | POA: Diagnosis present

## 2020-10-30 DIAGNOSIS — E876 Hypokalemia: Secondary | ICD-10-CM | POA: Diagnosis present

## 2020-10-30 DIAGNOSIS — E440 Moderate protein-calorie malnutrition: Secondary | ICD-10-CM | POA: Diagnosis not present

## 2020-10-30 DIAGNOSIS — Z20822 Contact with and (suspected) exposure to covid-19: Secondary | ICD-10-CM | POA: Diagnosis not present

## 2020-10-30 DIAGNOSIS — K625 Hemorrhage of anus and rectum: Secondary | ICD-10-CM | POA: Diagnosis not present

## 2020-10-30 DIAGNOSIS — E785 Hyperlipidemia, unspecified: Secondary | ICD-10-CM

## 2020-10-30 DIAGNOSIS — I10 Essential (primary) hypertension: Secondary | ICD-10-CM | POA: Diagnosis present

## 2020-10-30 DIAGNOSIS — D61818 Other pancytopenia: Secondary | ICD-10-CM | POA: Diagnosis not present

## 2020-10-30 DIAGNOSIS — R0609 Other forms of dyspnea: Secondary | ICD-10-CM

## 2020-10-30 DIAGNOSIS — C9000 Multiple myeloma not having achieved remission: Secondary | ICD-10-CM | POA: Diagnosis not present

## 2020-10-30 DIAGNOSIS — G9341 Metabolic encephalopathy: Secondary | ICD-10-CM | POA: Diagnosis not present

## 2020-10-30 DIAGNOSIS — D630 Anemia in neoplastic disease: Secondary | ICD-10-CM | POA: Diagnosis not present

## 2020-10-30 DIAGNOSIS — Z9119 Patient's noncompliance with other medical treatment and regimen: Secondary | ICD-10-CM | POA: Diagnosis not present

## 2020-10-30 DIAGNOSIS — E860 Dehydration: Secondary | ICD-10-CM | POA: Diagnosis present

## 2020-10-30 DIAGNOSIS — E871 Hypo-osmolality and hyponatremia: Secondary | ICD-10-CM | POA: Diagnosis not present

## 2020-10-30 DIAGNOSIS — Z6822 Body mass index (BMI) 22.0-22.9, adult: Secondary | ICD-10-CM | POA: Diagnosis not present

## 2020-10-30 DIAGNOSIS — R930 Abnormal findings on diagnostic imaging of skull and head, not elsewhere classified: Secondary | ICD-10-CM | POA: Diagnosis present

## 2020-10-30 DIAGNOSIS — D649 Anemia, unspecified: Secondary | ICD-10-CM

## 2020-10-30 DIAGNOSIS — D696 Thrombocytopenia, unspecified: Secondary | ICD-10-CM | POA: Diagnosis present

## 2020-10-30 DIAGNOSIS — N179 Acute kidney failure, unspecified: Secondary | ICD-10-CM | POA: Diagnosis present

## 2020-10-30 DIAGNOSIS — E538 Deficiency of other specified B group vitamins: Secondary | ICD-10-CM | POA: Diagnosis present

## 2020-10-30 DIAGNOSIS — I21A1 Myocardial infarction type 2: Secondary | ICD-10-CM | POA: Diagnosis not present

## 2020-10-30 DIAGNOSIS — C7951 Secondary malignant neoplasm of bone: Secondary | ICD-10-CM | POA: Diagnosis not present

## 2020-10-30 DIAGNOSIS — R7989 Other specified abnormal findings of blood chemistry: Secondary | ICD-10-CM

## 2020-10-30 DIAGNOSIS — E222 Syndrome of inappropriate secretion of antidiuretic hormone: Secondary | ICD-10-CM | POA: Diagnosis not present

## 2020-10-30 LAB — CBC WITH DIFFERENTIAL/PLATELET
Abs Immature Granulocytes: 0.06 10*3/uL (ref 0.00–0.07)
Basophils Absolute: 0 10*3/uL (ref 0.0–0.1)
Basophils Absolute: 0 10*3/uL (ref 0.0–0.1)
Basophils Relative: 0 %
Basophils Relative: 0 %
Eosinophils Absolute: 0 10*3/uL (ref 0.0–0.5)
Eosinophils Absolute: 0 10*3/uL (ref 0.0–0.5)
Eosinophils Relative: 0 %
Eosinophils Relative: 0 %
HCT: 12 % — ABNORMAL LOW (ref 39.0–52.0)
HCT: 16.8 % — ABNORMAL LOW (ref 39.0–52.0)
Hemoglobin: 3.9 g/dL — CL (ref 13.0–17.0)
Hemoglobin: 5.5 g/dL — CL (ref 13.0–17.0)
Immature Granulocytes: 1 %
Lymphocytes Relative: 28 %
Lymphocytes Relative: 13 %
Lymphs Abs: 1.7 10*3/uL (ref 0.7–4.0)
Lymphs Abs: 0.7 10*3/uL (ref 0.7–4.0)
MCH: 39.4 pg — ABNORMAL HIGH (ref 26.0–34.0)
MCH: 34.2 pg — ABNORMAL HIGH (ref 26.0–34.0)
MCHC: 32.5 g/dL (ref 30.0–36.0)
MCHC: 32.7 g/dL (ref 30.0–36.0)
MCV: 121.2 fL — ABNORMAL HIGH (ref 80.0–100.0)
MCV: 104.3 fL — ABNORMAL HIGH (ref 80.0–100.0)
Monocytes Absolute: 0.9 10*3/uL (ref 0.1–1.0)
Monocytes Absolute: 0.8 10*3/uL (ref 0.1–1.0)
Monocytes Relative: 16 %
Monocytes Relative: 15 %
Neutro Abs: 3.3 10*3/uL (ref 1.7–7.7)
Neutro Abs: 3.6 10*3/uL (ref 1.7–7.7)
Neutrophils Relative %: 56 %
Neutrophils Relative %: 71 %
Platelets: 125 10*3/uL — ABNORMAL LOW (ref 150–400)
Platelets: 97 10*3/uL — ABNORMAL LOW (ref 150–400)
RBC: 0.99 MIL/uL — ABNORMAL LOW (ref 4.22–5.81)
RBC: 1.61 MIL/uL — ABNORMAL LOW (ref 4.22–5.81)
RDW: 14.3 % (ref 11.5–15.5)
WBC Morphology: ABNORMAL
WBC: 5.9 10*3/uL (ref 4.0–10.5)
WBC: 5.1 10*3/uL (ref 4.0–10.5)
nRBC: 0 % (ref 0.0–0.2)
nRBC: 0 % (ref 0.0–0.2)

## 2020-10-30 LAB — RAPID URINE DRUG SCREEN, HOSP PERFORMED
Amphetamines: NOT DETECTED
Barbiturates: NOT DETECTED
Benzodiazepines: NOT DETECTED
Cocaine: NOT DETECTED
Opiates: NOT DETECTED
Tetrahydrocannabinol: NOT DETECTED

## 2020-10-30 LAB — PREPARE RBC (CROSSMATCH)

## 2020-10-30 LAB — URINALYSIS, ROUTINE W REFLEX MICROSCOPIC
Bacteria, UA: NONE SEEN
Bilirubin Urine: NEGATIVE
Glucose, UA: NEGATIVE mg/dL
Hgb urine dipstick: NEGATIVE
Ketones, ur: NEGATIVE mg/dL
Leukocytes,Ua: NEGATIVE
Nitrite: NEGATIVE
Protein, ur: 100 mg/dL — AB
Specific Gravity, Urine: 1.014 (ref 1.005–1.030)
pH: 7 (ref 5.0–8.0)

## 2020-10-30 LAB — RETICULOCYTES
Immature Retic Fract: 13 % (ref 2.3–15.9)
RBC.: 1.61 MIL/uL — ABNORMAL LOW (ref 4.22–5.81)
Retic Count, Absolute: 19.8 10*3/uL (ref 19.0–186.0)
Retic Ct Pct: 1.2 % (ref 0.4–3.1)

## 2020-10-30 LAB — MRSA PCR SCREENING: MRSA by PCR: NEGATIVE

## 2020-10-30 LAB — MAGNESIUM: Magnesium: 1.7 mg/dL (ref 1.7–2.4)

## 2020-10-30 LAB — FOLATE
Folate: 8.3 ng/mL (ref >5.9)
Folate: 8.8 ng/mL (ref >5.9)

## 2020-10-30 LAB — ECHOCARDIOGRAM COMPLETE
AR max vel: 2.79 cm2
AV Area VTI: 3.01 cm2
AV Area mean vel: 2.74 cm2
AV Mean grad: 5.8 mmHg
AV Peak grad: 12 mmHg
Ao pk vel: 1.73 m/s
Area-P 1/2: 4.54 cm2
Height: 69 in
MV M vel: 4.87 m/s
MV Peak grad: 94.9 mmHg
Radius: 0.6 cm
S' Lateral: 3.5 cm
Weight: 2430.35 oz

## 2020-10-30 LAB — IRON AND TIBC
Iron: 124 ug/dL (ref 45–182)
Iron: 101 ug/dL (ref 45–182)
Saturation Ratios: 47 % — ABNORMAL HIGH (ref 17.9–39.5)
Saturation Ratios: 42 % — ABNORMAL HIGH (ref 17.9–39.5)
TIBC: 266 ug/dL (ref 250–450)
TIBC: 242 ug/dL — ABNORMAL LOW (ref 250–450)
UIBC: 142 ug/dL
UIBC: 141 ug/dL

## 2020-10-30 LAB — TSH: TSH: 5.278 u[IU]/mL — ABNORMAL HIGH (ref 0.350–4.500)

## 2020-10-30 LAB — COMPREHENSIVE METABOLIC PANEL
ALT: 18 U/L (ref 0–44)
AST: 39 U/L (ref 15–41)
Albumin: 2.2 g/dL — ABNORMAL LOW (ref 3.5–5.0)
Alkaline Phosphatase: 59 U/L (ref 38–126)
Anion gap: 5 (ref 5–15)
BUN: 45 mg/dL — ABNORMAL HIGH (ref 8–23)
CO2: 25 mmol/L (ref 22–32)
Calcium: 12.8 mg/dL — ABNORMAL HIGH (ref 8.9–10.3)
Chloride: 100 mmol/L (ref 98–111)
Creatinine, Ser: 3.96 mg/dL — ABNORMAL HIGH (ref 0.61–1.24)
GFR, Estimated: 16 mL/min — ABNORMAL LOW (ref >60)
Glucose, Bld: 135 mg/dL — ABNORMAL HIGH (ref 70–99)
Potassium: 4.3 mmol/L (ref 3.5–5.1)
Sodium: 130 mmol/L — ABNORMAL LOW (ref 135–145)
Total Bilirubin: 0.6 mg/dL (ref 0.3–1.2)
Total Protein: 11.2 g/dL — ABNORMAL HIGH (ref 6.5–8.1)

## 2020-10-30 LAB — TROPONIN I (HIGH SENSITIVITY)
Troponin I (High Sensitivity): 78 ng/L — ABNORMAL HIGH (ref <18)
Troponin I (High Sensitivity): 2985 ng/L (ref <18)
Troponin I (High Sensitivity): 3519 ng/L (ref <18)

## 2020-10-30 LAB — LACTATE DEHYDROGENASE: LDH: 86 U/L — ABNORMAL LOW (ref 98–192)

## 2020-10-30 LAB — VITAMIN B12
Vitamin B-12: 106 pg/mL — ABNORMAL LOW (ref 180–914)
Vitamin B-12: 132 pg/mL — ABNORMAL LOW (ref 180–914)

## 2020-10-30 LAB — ABO/RH: ABO/RH(D): B POS

## 2020-10-30 LAB — COOXEMETRY PANEL
Carboxyhemoglobin: 2.5 % — ABNORMAL HIGH (ref 0.5–1.5)
Methemoglobin: 0.9 % (ref 0.0–1.5)
O2 Saturation: 96.4 %
Total hemoglobin: 3.5 g/dL — CL (ref 12.0–16.0)

## 2020-10-30 LAB — RESP PANEL BY RT-PCR (FLU A&B, COVID) ARPGX2
Influenza A by PCR: NEGATIVE
Influenza B by PCR: NEGATIVE
SARS Coronavirus 2 by RT PCR: NEGATIVE

## 2020-10-30 LAB — FERRITIN
Ferritin: 627 ng/mL — ABNORMAL HIGH (ref 24–336)
Ferritin: 1406 ng/mL — ABNORMAL HIGH (ref 24–336)

## 2020-10-30 LAB — HIV ANTIBODY (ROUTINE TESTING W REFLEX): HIV Screen 4th Generation wRfx: NONREACTIVE

## 2020-10-30 LAB — VITAMIN D 25 HYDROXY (VIT D DEFICIENCY, FRACTURES): Vit D, 25-Hydroxy: 80.38 ng/mL (ref 30–100)

## 2020-10-30 LAB — T4, FREE: Free T4: 0.87 ng/dL (ref 0.61–1.12)

## 2020-10-30 MED ORDER — CALCITONIN (SALMON) 200 UNIT/ML IJ SOLN
4.0000 [IU]/kg | Freq: Two times a day (BID) | INTRAMUSCULAR | Status: AC
  Administered 2020-10-30 – 2020-10-31 (×4): 282 [IU] via SUBCUTANEOUS
  Filled 2020-10-30: qty 2
  Filled 2020-10-30 (×4): qty 1.41

## 2020-10-30 MED ORDER — ACETAMINOPHEN 650 MG RE SUPP: 650.0000 mg | Freq: Four times a day (QID) | RECTAL | Status: DC | PRN

## 2020-10-30 MED ORDER — ACETAMINOPHEN 325 MG PO TABS: 650.0000 mg | ORAL_TABLET | Freq: Four times a day (QID) | ORAL | Status: DC | PRN

## 2020-10-30 MED ORDER — SODIUM CHLORIDE 0.9 % IV SOLN
90.0000 mg | Freq: Once | INTRAVENOUS | Status: AC
  Administered 2020-10-30: 90 mg via INTRAVENOUS
  Filled 2020-10-30: qty 10

## 2020-10-30 MED ORDER — MORPHINE SULFATE (PF) 2 MG/ML IV SOLN: 2.0000 mg | INTRAVENOUS | Status: DC | PRN

## 2020-10-30 MED ORDER — OXYCODONE HCL 5 MG PO TABS
5.0000 mg | ORAL_TABLET | ORAL | Status: DC | PRN
Start: 2020-10-30 — End: 2020-11-09

## 2020-10-30 MED ORDER — ZOLEDRONIC ACID 5 MG/100ML IV SOLN: 5.0000 mg | Freq: Once | INTRAVENOUS | Status: DC

## 2020-10-30 MED ORDER — HYDROMORPHONE HCL 1 MG/ML IJ SOLN
0.5000 mg | INTRAMUSCULAR | Status: DC | PRN
  Administered 2020-10-31 (×2): 0.5 mg via INTRAVENOUS
  Filled 2020-10-30 (×2): qty 0.5

## 2020-10-30 MED ORDER — ZOLEDRONIC ACID 4 MG/5ML IV CONC
INTRAVENOUS | Status: AC
  Filled 2020-10-30: qty 10

## 2020-10-30 MED ORDER — METOPROLOL TARTRATE 25 MG PO TABS
25.0000 mg | ORAL_TABLET | Freq: Two times a day (BID) | ORAL | Status: DC
  Administered 2020-10-30 (×2): 25 mg via ORAL
  Filled 2020-10-30 (×2): qty 1

## 2020-10-30 MED ORDER — HALOPERIDOL LACTATE 5 MG/ML IJ SOLN
5.0000 mg | Freq: Once | INTRAMUSCULAR | Status: AC
  Administered 2020-10-30: 5 mg via INTRAVENOUS
  Filled 2020-10-30: qty 1

## 2020-10-30 MED ORDER — ROSUVASTATIN CALCIUM 10 MG PO TABS
10.0000 mg | ORAL_TABLET | Freq: Every day | ORAL | Status: DC
  Administered 2020-10-30 – 2020-11-02 (×4): 10 mg via ORAL
  Filled 2020-10-30 (×4): qty 1

## 2020-10-30 MED ORDER — SODIUM CHLORIDE 0.9% IV SOLUTION: Freq: Once | INTRAVENOUS | Status: AC

## 2020-10-30 MED ORDER — ONDANSETRON HCL 4 MG PO TABS: 4.0000 mg | ORAL_TABLET | Freq: Four times a day (QID) | ORAL | Status: DC | PRN

## 2020-10-30 MED ORDER — SODIUM CHLORIDE 0.9 % IV SOLN: INTRAVENOUS | Status: DC

## 2020-10-30 MED ORDER — FLUTICASONE FUROATE-VILANTEROL 200-25 MCG/INH IN AEPB
1.0000 | INHALATION_SPRAY | Freq: Every day | RESPIRATORY_TRACT | Status: DC
  Administered 2020-10-30 – 2020-11-09 (×11): 1 via RESPIRATORY_TRACT
  Filled 2020-10-30 (×2): qty 28

## 2020-10-30 MED ORDER — ENSURE ENLIVE PO LIQD
237.0000 mL | Freq: Two times a day (BID) | ORAL | Status: DC
  Administered 2020-10-30 – 2020-11-07 (×8): 237 mL via ORAL

## 2020-10-30 MED ORDER — ONDANSETRON HCL 4 MG/2ML IJ SOLN
4.0000 mg | Freq: Four times a day (QID) | INTRAMUSCULAR | Status: DC | PRN
  Administered 2020-10-30 (×3): 4 mg via INTRAVENOUS
  Filled 2020-10-30 (×3): qty 2

## 2020-10-30 MED ORDER — CHLORHEXIDINE GLUCONATE CLOTH 2 % EX PADS
6.0000 | MEDICATED_PAD | Freq: Every day | CUTANEOUS | Status: DC
  Administered 2020-10-31 – 2020-11-05 (×6): 6 via TOPICAL

## 2020-10-30 NOTE — Progress Notes (Signed)
Troponin critical level of 78. MD notified.

## 2020-10-30 NOTE — Progress Notes (Signed)
Lab called critical Total Hgb at 10/30/20 2215 of 3.5 due to not calling the critical at 0014 on 10/30/20. Lab result from time of admit and not current.

## 2020-10-30 NOTE — Progress Notes (Signed)
Patient had episode of incontinence. Asked MD if the 24hr urine collection should be started over at current time, MD instructed nurse to continue with current urine collection from start time of 1500 on 10/30/20

## 2020-10-30 NOTE — H&P (Addendum)
TRH H&P    Patient Demographics:    Paul Norton, is a 65 y.o. male  MRN: [id number]  DOB - 1956/05/03  Admit Date - 10/29/2020  Referring MD/NP/PA: Rancour  Outpatient Primary MD for the patient is Tysinger, Camelia Eng, PA-C  Patient coming from: Home  Chief complaint- confusion   HPI:    Paul Norton  is a 65 y.o. male, with history of HTN, HLD, and history of noncompliance with PCP advised normal presents to the ED with a chief complaint of confusion.  Patient lives alone, nephew is closest relative and checks on him occasionally.  3 weeks ago nephew thought about bringing him into the ER, because he thought he was just not doing well, patient was alert and oriented did not want to come into the ER.  Today nephews and somebody to check on patient again and he was confused, visibly dehydrated, and nephew requested the patient be brought into the ED.  Nephew does report that about a month ago there was a questionable carbon monoxide exposure which she was not seen by provider, 2 months ago there was questionable COVID.  Unfortunately patient is confused so no further history can be obtained.  At baseline now he reports that patient is healthy, drinks a lot of water, walks a lot, and as sharp as attack.  He has no children and is not married.  His brother would technically be next of kin, but they are not close.  At the time of my exam reports he is not currently in any pain and his only complaint is dry mouth.  In the ED vitals were stable, slight tachycardia 100, blood pressure 128/60.  Hematology revealed a severe anemia with a hemoglobin of 3.9.  Chemistry panel revealed an AKI with a BUN of 47, creatinine of 4.5-up from 1.2.  Albumin is down to 2.5, ammonia was normal at 13, troponin is normal at 12, CK was normal at 17.  Calcium was elevated at 14.2 which corrects to 15.3.  CT head was done and showed mottled  appearance of the calvarium and skull base with lytic lesions that could be multiple myeloma or metastatic disease.  No acute intracranial abnormality is seen within limitations of an unenhanced CT.  Respiratory panel was negative.  Chest x-ray showed mild vascular congestion without acute infiltrate or edema.  EKG showed a heart rate of 96, QTc 397, global ST depressions.  FOBT negative.  Patient was given a liter of fluids, 2 units transfusion ordered for which nephew gave consent.  Admission was requested for further work-up and management of AKI, severe anemia.   Review of systems:    Unfortunately review of systems cannot be obtained secondary to patient's altered mental status.   Past History of the following :    Past Medical History:  Diagnosis Date  . Colonoscopy refused 09/2017  . Hyperlipidemia   . Hypertension   . Refuses treatment 09/2017   refuses screenings such as cancer screening, refuses vaccines, refuses normal preventative care  . Vaccine refused by patient  all vaccines as of 09/2017      Past Surgical History:  Procedure Laterality Date  . COLONOSCOPY     never, declines as of 09/2017      Social History:      Social History   Tobacco Use  . Smoking status: Never Smoker  . Smokeless tobacco: Never Used  Substance Use Topics  . Alcohol use: No       Family History :    No family history on file. Family history could not be reviewed secondary to patient's altered mental status   Home Medications:   Prior to Admission medications   Medication Sig Start Date End Date Taking? Authorizing Provider  amLODipine (NORVASC) 10 MG tablet Take 1 tablet (10 mg total) by mouth daily. 03/31/20   Tysinger, Camelia Eng, PA-C  aspirin EC 81 MG tablet Take 1 tablet (81 mg total) by mouth daily. 03/31/20   Tysinger, Camelia Eng, PA-C  azithromycin (ZITHROMAX) 250 MG tablet 2 tablets day 1, then 1 tablet days 2-4 09/06/20   Tysinger, Camelia Eng, PA-C  fluticasone  furoate-vilanterol (BREO ELLIPTA) 200-25 MCG/INH AEPB Inhale 1 puff into the lungs daily. 09/06/20   Tysinger, Camelia Eng, PA-C  metoprolol tartrate (LOPRESSOR) 100 MG tablet Take 1 tablet (100 mg total) by mouth 2 (two) times daily. 03/31/20   Tysinger, Camelia Eng, PA-C  Multiple Vitamins-Minerals (EMERGEN-C IMMUNE PLUS) PACK Take 1 tablet by mouth 2 (two) times daily. 09/06/20   Tysinger, Camelia Eng, PA-C  predniSONE (DELTASONE) 10 MG tablet 6/5/4/3/2/1 taper 09/06/20   Tysinger, Camelia Eng, PA-C  promethazine-dextromethorphan (PROMETHAZINE-DM) 6.25-15 MG/5ML syrup Take 5 mLs by mouth 4 (four) times daily as needed for cough. 09/06/20   Tysinger, Camelia Eng, PA-C  rosuvastatin (CRESTOR) 10 MG tablet Take 1 tablet (10 mg total) by mouth at bedtime. 03/31/20 03/31/21  Tysinger, Camelia Eng, PA-C     Allergies:    No Known Allergies   Physical Exam:   Vitals  Blood pressure (!) 106/50, pulse 97, temperature 97.7 F (36.5 C), temperature source Oral, resp. rate 20, height $RemoveBe'5\' 9"'DXqWmdCjW$  (1.753 m), weight 70.4 kg, SpO2 98 %.  1.  General: Patient is lying supine in bed with head of bed elevated, ill-appearing, but no acute distress  2. Psychiatric: Patient is oriented to self and place, but not to time or context, irritable about staying in the hospital, cooperative with exam  3. Neurologic: Face is symmetric, speech and language are normal, moves all 4 extremities voluntarily, equal sensation in the upper and lower extremities, difficulty understanding finger-to-nose test, no acute deficits limited exam  4. HEENMT:  Head is atraumatic, normocephalic, pupils reactive to light, neck is cachectic, trachea is midline, mucous membranes are dry  5. Respiratory : Lungs are clear to auscultation bilaterally without wheezes, rhonchi, rales, no clubbing, no cyanosis  6. Cardiovascular : Heart rate is normal, rhythm is regular, no murmurs rubs or gallops, no peripheral edema  7. Gastrointestinal:  Abdomen is soft,  nondistended, nontender to palpation, bowel sounds active  8. Skin:  Skin is warm dry and intact without acute lesion on limited exam  9.Musculoskeletal:  No calf tenderness, peripheral pulses palpated, no acute deformity    Data Review:    CBC Recent Labs  Lab 10/29/20 2307  WBC 5.9  HGB 3.9*  HCT 12.0*  PLT 125*  MCV 121.2*  MCH 39.4*  MCHC 32.5  RDW 14.3  LYMPHSABS 1.7  MONOABS 0.9  EOSABS 0.0  BASOSABS 0.0   ------------------------------------------------------------------------------------------------------------------  Results for orders placed or performed during the hospital encounter of 10/29/20 (from the past 48 hour(s))  Resp Panel by RT-PCR (Flu A&B, Covid) Nasopharyngeal Swab     Status: None   Collection Time: 10/29/20 10:28 PM   Specimen: Nasopharyngeal Swab; Nasopharyngeal(NP) swabs in vial transport medium  Result Value Ref Range   SARS Coronavirus 2 by RT PCR NEGATIVE NEGATIVE    Comment: (NOTE) SARS-CoV-2 target nucleic acids are NOT DETECTED.  The SARS-CoV-2 RNA is generally detectable in upper respiratory specimens during the acute phase of infection. The lowest concentration of SARS-CoV-2 viral copies this assay can detect is 138 copies/mL. A negative result does not preclude SARS-Cov-2 infection and should not be used as the sole basis for treatment or other patient management decisions. A negative result may occur with  improper specimen collection/handling, submission of specimen other than nasopharyngeal swab, presence of viral mutation(s) within the areas targeted by this assay, and inadequate number of viral copies(<138 copies/mL). A negative result must be combined with clinical observations, patient history, and epidemiological information. The expected result is Negative.  Fact Sheet for Patients:  EntrepreneurPulse.com.au  Fact Sheet for Healthcare Providers:  IncredibleEmployment.be  This  test is no t yet approved or cleared by the [place] FDA and  has been authorized for detection and/or diagnosis of SARS-CoV-2 by FDA under an Emergency Use Authorization (EUA). This EUA will remain  in effect (meaning this test can be used) for the duration of the COVID-19 declaration under Section 564(b)(1) of the Act, 21 U.S.C.section 360bbb-3(b)(1), unless the authorization is terminated  or revoked sooner.       Influenza A by PCR NEGATIVE NEGATIVE   Influenza B by PCR NEGATIVE NEGATIVE    Comment: (NOTE) The Xpert Xpress SARS-CoV-2/FLU/RSV plus assay is intended as an aid in the diagnosis of influenza from Nasopharyngeal swab specimens and should not be used as a sole basis for treatment. Nasal washings and aspirates are unacceptable for Xpert Xpress SARS-CoV-2/FLU/RSV testing.  Fact Sheet for Patients: EntrepreneurPulse.com.au  Fact Sheet for Healthcare Providers: IncredibleEmployment.be  This test is not yet approved or cleared by the [place] FDA and has been authorized for detection and/or diagnosis of SARS-CoV-2 by FDA under an Emergency Use Authorization (EUA). This EUA will remain in effect (meaning this test can be used) for the duration of the COVID-19 declaration under Section 564(b)(1) of the Act, 21 U.S.C. section 360bbb-3(b)(1), unless the authorization is terminated or revoked.  Performed at [facility], [address], [place], [place] [address]   CBC with Differential/Platelet     Status: Abnormal   Collection Time: 10/29/20 11:07 PM  Result Value Ref Range   WBC 5.9 4.0 - 10.5 K/uL   RBC 0.99 (L) 4.22 - 5.81 MIL/uL   Hemoglobin 3.9 (LL) 13.0 - 17.0 g/dL    Comment: This critical result has verified and been called to [facility] by Duncan Dull on 04 16 2022 at 2330, and has been read back.    HCT 12.0 (L) 39.0 - 52.0 %   MCV 121.2 (H) 80.0 - 100.0 fL   MCH 39.4 (H) 26.0 - 34.0 pg   MCHC 32.5 30.0 - 36.0  g/dL   RDW 14.3 11.5 - 15.5 %   Platelets 125 (L) 150 - 400 K/uL   nRBC 0.0 0.0 - 0.2 %   Neutrophils Relative % 56 %   Neutro Abs 3.3 1.7 - 7.7 K/uL   Lymphocytes Relative 28 %   Lymphs Abs  1.7 0.7 - 4.0 K/uL   Monocytes Relative 16 %   Monocytes Absolute 0.9 0.1 - 1.0 K/uL   Eosinophils Relative 0 %   Eosinophils Absolute 0.0 0.0 - 0.5 K/uL   Basophils Relative 0 %   Basophils Absolute 0.0 0.0 - 0.1 K/uL   WBC Morphology Abnormal lymphocytes present     Comment: Performed at [facility], [address], [place], [place] [address]  Comprehensive metabolic panel     Status: Abnormal   Collection Time: 10/29/20 11:07 PM  Result Value Ref Range   Sodium 127 (L) 135 - 145 mmol/L   Potassium 3.8 3.5 - 5.1 mmol/L   Chloride 96 (L) 98 - 111 mmol/L   CO2 24 22 - 32 mmol/L   Glucose, Bld 129 (H) 70 - 99 mg/dL    Comment: Glucose reference range applies only to samples taken after fasting for at least 8 hours.   BUN 47 (H) 8 - 23 mg/dL   Creatinine, Ser 4.50 (H) 0.61 - 1.24 mg/dL   Calcium 14.2 (HH) 8.9 - 10.3 mg/dL    Comment: RESULT VERIFIED WITH REPEAT COLLECTION AND ANALYSIS CRITICAL RESULT CALLED TO, READ BACK BY AND VERIFIED WITH: Lynann Bologna, A 2358 10/29/2020 COLEMAN,R    Total Protein 12.4 (H) 6.5 - 8.1 g/dL   Albumin 2.5 (L) 3.5 - 5.0 g/dL   AST 32 15 - 41 U/L   ALT 18 0 - 44 U/L   Alkaline Phosphatase 64 38 - 126 U/L   Total Bilirubin 0.7 0.3 - 1.2 mg/dL   GFR, Estimated 14 (L) >60 mL/min    Comment: (NOTE) Calculated using the CKD-EPI Creatinine Equation (2021)    Anion gap 7 5 - 15    Comment: Performed at [facility], [address], [place], [place] [address]  Troponin I (High Sensitivity)     Status: None   Collection Time: 10/29/20 11:07 PM  Result Value Ref Range   Troponin I (High Sensitivity) 12 <18 ng/L    Comment: (NOTE) Elevated high sensitivity troponin I (hsTnI) values and significant  changes across serial measurements may suggest ACS but many other   chronic and acute conditions are known to elevate hsTnI results.  Refer to the Links section for chest pain algorithms and additional  guidance. Performed at [facility], [address] [place], [place] [address]   Ammonia     Status: None   Collection Time: 10/29/20 11:07 PM  Result Value Ref Range   Ammonia 13 9 - 35 umol/L    Comment: Performed at [facility], [address] [place], [place] [address]  TSH     Status: Abnormal   Collection Time: 10/29/20 11:07 PM  Result Value Ref Range   TSH 5.278 (H) 0.350 - 4.500 uIU/mL    Comment: Performed by a 3rd Generation assay with a functional sensitivity of <=0.01 uIU/mL. Performed at [facility], [address] [place], [place] [address]   CK     Status: Abnormal   Collection Time: 10/29/20 11:07 PM  Result Value Ref Range   Total CK 17 (L) 49 - 397 U/L    Comment: Performed at [facility], [address], [place], [place] [address]  Type and screen [facility]     Status: None (Preliminary result)   Collection Time: 10/29/20 11:07 PM  Result Value Ref Range   ABO/RH(D) B POS    Antibody Screen NEG    Sample Expiration 11/01/2020,2359    Unit Number  [id number]    Blood Component Type RED CELLS,LR    Unit division 00    Status of Unit ALLOCATED    Transfusion Status OK TO TRANSFUSE    Crossmatch Result      Compatible Performed at [facility], [address], [place], [place] [address]    Unit Number [id number]    Blood Component Type RED CELLS,LR    Unit division 00    Status of Unit ALLOCATED    Transfusion Status OK TO TRANSFUSE    Crossmatch Result Compatible    Unit Number [id number]    Blood Component Type RBC LR PHER1    Unit division 00    Status of Unit ALLOCATED    Transfusion Status OK TO TRANSFUSE    Crossmatch Result Compatible    Unit Number [id number]    Blood Component Type RED CELLS,LR    Unit division 00    Status of Unit ALLOCATED    Transfusion Status OK TO TRANSFUSE     Crossmatch Result Compatible   Vitamin B12     Status: Abnormal   Collection Time: 10/29/20 11:07 PM  Result Value Ref Range   Vitamin B-12 106 (L) 180 - 914 pg/mL    Comment: (NOTE) This assay is not validated for testing neonatal or myeloproliferative syndrome specimens for Vitamin B12 levels. Performed at [facility], [address] [place], [place] [address]   Folate     Status: None   Collection Time: 10/29/20 11:07 PM  Result Value Ref Range   Folate 8.3 >5.9 ng/mL    Comment: Performed at [facility], [address], [place], [place] [address]  Iron and TIBC     Status: Abnormal   Collection Time: 10/29/20 11:07 PM  Result Value Ref Range   Iron 124 45 - 182 ug/dL   TIBC 991 906 - 077 ug/dL   Saturation Ratios 47 (H) 17.9 - 39.5 %   UIBC 142 ug/dL    Comment: Performed at [facility], [address], [place], [place] [address]  Ferritin     Status: Abnormal   Collection Time: 10/29/20 11:07 PM  Result Value Ref Range   Ferritin 627 (H) 24 - 336 ng/mL    Comment: Performed at [facility], [address], [place], [place] [address]  Reticulocytes     Status: Abnormal   Collection Time: 10/29/20 11:07 PM  Result Value Ref Range   Retic Ct Pct 1.3 0.4 - 3.1 %   RBC. 1.02 (L) 4.22 - 5.81 MIL/uL   Retic Count, Absolute 12.8 (L) 19.0 - 186.0 K/uL   Immature Retic Fract 16.5 (H) 2.3 - 15.9 %    Comment: Performed at [facility], [address] [place], [place] [address]  Urinalysis, Routine w reflex microscopic Urine, Clean Catch     Status: Abnormal   Collection Time: 10/29/20 11:56 PM  Result Value Ref Range   Color, Urine YELLOW YELLOW   APPearance HAZY (A) CLEAR   Specific Gravity, Urine 1.014 1.005 - 1.030   pH 7.0 5.0 - 8.0   Glucose, UA NEGATIVE NEGATIVE mg/dL   Hgb urine dipstick NEGATIVE NEGATIVE   Bilirubin Urine NEGATIVE NEGATIVE   Ketones, ur NEGATIVE NEGATIVE mg/dL   Protein, ur 633 (A) NEGATIVE mg/dL   Nitrite NEGATIVE NEGATIVE   Leukocytes,Ua NEGATIVE  NEGATIVE   RBC / HPF 0-5 0 - 5 RBC/hpf   WBC, UA 0-5 0 - 5 WBC/hpf   Bacteria, UA NONE SEEN NONE SEEN  Ca Oxalate Crys, UA PRESENT     Comment: Performed at [facility], [address], [place], [place] [address]  Rapid urine drug screen (hospital performed)     Status: None   Collection Time: 10/29/20 11:56 PM  Result Value Ref Range   Opiates NONE DETECTED NONE DETECTED   Cocaine NONE DETECTED NONE DETECTED   Benzodiazepines NONE DETECTED NONE DETECTED   Amphetamines NONE DETECTED NONE DETECTED   Tetrahydrocannabinol NONE DETECTED NONE DETECTED   Barbiturates NONE DETECTED NONE DETECTED    Comment: (NOTE) DRUG SCREEN FOR MEDICAL PURPOSES ONLY.  IF CONFIRMATION IS NEEDED FOR ANY PURPOSE, NOTIFY LAB WITHIN 5 DAYS.  LOWEST DETECTABLE LIMITS FOR URINE DRUG SCREEN Drug Class                     Cutoff (ng/mL) Amphetamine and metabolites    1000 Barbiturate and metabolites    200 Benzodiazepine                 638 Tricyclics and metabolites     300 Opiates and metabolites        300 Cocaine and metabolites        300 THC                            50 Performed at [facility], [address], [place], [place] [address]   Prepare RBC (crossmatch)     Status: None   Collection Time: 10/30/20 12:05 AM  Result Value Ref Range   Order Confirmation      ORDER PROCESSED BY BLOOD BANK Performed at [facility], [address], [place], [place] [address]   ABO/Rh     Status: None   Collection Time: 10/30/20 12:05 AM  Result Value Ref Range   ABO/RH(D)      B POS Performed at [facility], [address] [place], [place] [address]   .Cooxemetry Panel (carboxy, met, total hgb, O2 sat)     Status: Abnormal   Collection Time: 10/30/20 12:14 AM  Result Value Ref Range   Total hemoglobin 3.5 (LL) 12.0 - 16.0 g/dL   O2 Saturation 96.4 %   Carboxyhemoglobin 2.5 (H) 0.5 - 1.5 %   Methemoglobin 0.9 0.0 - 1.5 %    Comment: Performed at [facility], [address], [place], [place]  [address]    Chemistries  Recent Labs  Lab 10/29/20 2307  NA 127*  K 3.8  CL 96*  CO2 24  GLUCOSE 129*  BUN 47*  CREATININE 4.50*  CALCIUM 14.2*  AST 32  ALT 18  ALKPHOS 64  BILITOT 0.7   ------------------------------------------------------------------------------------------------------------------  ------------------------------------------------------------------------------------------------------------------ GFR: Estimated Creatinine Clearance: 16.7 mL/min (A) (by C-G formula based on SCr of 4.5 mg/dL (H)). Liver Function Tests: Recent Labs  Lab 10/29/20 2307  AST 32  ALT 18  ALKPHOS 64  BILITOT 0.7  PROT 12.4*  ALBUMIN 2.5*   No results for input(s): LIPASE, AMYLASE in the last 168 hours. Recent Labs  Lab 10/29/20 2307  AMMONIA 13   Coagulation Profile: No results for input(s): INR, PROTIME in the last 168 hours. Cardiac Enzymes: Recent Labs  Lab 10/29/20 2307  CKTOTAL 17*   BNP (last 3 results) No results for input(s): PROBNP in the last 8760 hours. HbA1C: No results for input(s): HGBA1C in the last 72 hours. CBG: No results for input(s): GLUCAP in the last 168 hours. Lipid Profile: No results for  input(s): CHOL, HDL, LDLCALC, TRIG, CHOLHDL, LDLDIRECT in the last 72 hours. Thyroid Function Tests: Recent Labs    10/29/20 2307  TSH 5.278*   Anemia Panel: Recent Labs    10/29/20 2307  VITAMINB12 106*  FOLATE 8.3  FERRITIN 627*  TIBC 266  IRON 124  RETICCTPCT 1.3    --------------------------------------------------------------------------------------------------------------- Urine analysis:    Component Value Date/Time   COLORURINE YELLOW 10/29/2020 2356   APPEARANCEUR HAZY (A) 10/29/2020 2356   LABSPEC 1.014 10/29/2020 2356   PHURINE 7.0 10/29/2020 2356   GLUCOSEU NEGATIVE 10/29/2020 2356   HGBUR NEGATIVE 10/29/2020 2356   BILIRUBINUR NEGATIVE 10/29/2020 2356   KETONESUR NEGATIVE 10/29/2020 2356   PROTEINUR 100 (A) 10/29/2020  2356   NITRITE NEGATIVE 10/29/2020 2356   LEUKOCYTESUR NEGATIVE 10/29/2020 2356      Imaging Results:    CT ABDOMEN PELVIS WO CONTRAST  Result Date: 10/30/2020 CLINICAL DATA:  Cancer of unknown primary, multiple lytic lesions in the calvaria noted incidentally. Dizziness, unintended weight loss. EXAM: CT CHEST, ABDOMEN AND PELVIS WITHOUT CONTRAST TECHNIQUE: Multidetector CT imaging of the chest, abdomen and pelvis was performed following the standard protocol without IV contrast. COMPARISON:  CT head 10/29/2020, chest radiograph 10/29/2020 FINDINGS: CT CHEST FINDINGS Cardiovascular: Cardiomegaly. Coronary artery calcifications. Hypoattenuation of the cardiac blood pool compatible with anemia. The aortic root is suboptimally assessed given cardiac pulsation artifact. Atherosclerotic plaque within the normal caliber aorta. Shared origin of the brachiocephalic and left common carotid arteries. Proximal great vessels are otherwise unremarkable. Central pulmonary arteries are top-normal caliber. No large central filling defects within limitations of a nonenhanced exam. Mediastinum/Nodes: No mediastinal fluid or gas. Normal thyroid gland and thoracic inlet. No acute abnormality of the trachea or esophagus. No worrisome mediastinal or axillary adenopathy. Hilar nodal evaluation is limited in the absence of intravenous contrast media. Lungs/Pleura: No consolidation, features of edema, pneumothorax, or effusion. No suspicious pulmonary nodules or masses. Some dependent atelectasis. Evaluation slightly limited by mild respiratory motion artifact. Musculoskeletal: Widespread extensive lytic mottling throughout the axial and appendicular skeleton. Multiple rib fractures are seen in various stages of healing many of which are presumed to be pathologic in nature. Severe degenerative changes are present in the bilateral shoulders including larger heterotopic ossifications seen in the left shoulder recess. Bridging  syndesmophytes across much of the thoracic levels and partial bridging across the spinous processes, could reflect some underlying spondyloarthropathy. CT ABDOMEN PELVIS FINDINGS Hepatobiliary: No visible focal liver lesion with limitations of an unenhanced CT. Smooth surface contour. Normal hepatic attenuation. Normal gallbladder and biliary tree without visible calcified gallstone. Pancreas: Mild pancreatic atrophy. No pancreatic ductal dilatation or surrounding inflammatory changes. Spleen: Splenomegaly within enlarged, lobular spleen. No visible focal splenic lesion within the limitations of this unenhanced exam. Adrenals/Urinary Tract: No adrenal mass or hemorrhage. Slight under rotation of the bilateral kidneys, anatomic variant. No visible or contour deforming renal lesion. No urolithiasis or hydronephrosis. Urinary bladder is largely decompressed at the time of exam and therefore poorly evaluated by CT imaging. Mild circumferential bladder wall thickening may be related underdistention or chronic outlet obstruction given marked indentation of bladder base by an enlarged prostate. Stomach/Bowel: Distal esophagus, stomach and duodenum are unremarkable. No small bowel thickening or dilatation. Normal appendix in the right lower quadrant. Extensive distal colonic diverticulosis without active acute inflammation to suggest active diverticulitis. Some mild mural thickening in the sigmoid may be reflective of prior inflammation though warrants further evaluation with outpatient colonoscopy if not recently performed. Vascular/Lymphatic: Atherosclerotic calcifications within  the abdominal aorta and branch vessels. No aneurysm or ectasia. No enlarged abdominopelvic lymph nodes. Reproductive: Enlarged prostate with few typically benign punctate calcifications. Trace right hydrocele. Other: No abdominopelvic free air or fluid. No bowel containing hernia. Musculoskeletal: Extensive lytic foci and bony remodeling  throughout the axial and appendicular skeleton of the abdomen and pelvis including the proximal femora as well. Bony ankylosis of the L4-L5 and lumbosacral junction as well as across the bilateral SI joints. IMPRESSION: 1. Extensive lytic foci and bony mottling throughout the axial and appendicular skeleton of the chest, abdomen and pelvis including the proximal femora and humeri as well as multiple bilateral rib fractures in various stages of healing many of which are presumed to be pathologic in nature. Findings are concerning for a diffuse osseous metastatic disease versus multiple myeloma. 2. Mild mural thickening in the sigmoid may be reflective of prior inflammation given the numerous colonic diverticula within the segment though warrants further evaluation with outpatient colonoscopy if not recently performed. 3. Splenomegaly within enlarged, lobular spleen, nonspecific but can be seen in the setting of myeloproliferative disorder. 4. Cardiomegaly and coronary artery calcifications. Hypoattenuation of the cardiac blood pool suggestive of anemia. 5. Trace right hydrocele. 6. Mild circumferential bladder wall thickening may be related underdistention or chronic outlet obstruction given marked indentation of bladder base by an enlarged prostate. Could correlate with urinalysis to exclude cystitis. 7. Prostatomegaly. 8. Aortic Atherosclerosis (ICD10-I70.0). Electronically Signed   By: Lovena Le M.D.   On: 10/30/2020 01:15   CT Head Wo Contrast  Result Date: 10/29/2020 CLINICAL DATA:  Dizziness, carbon monoxide exposure 2 months prior with COVID few weeks ago. Unintended weight loss. EXAM: CT HEAD WITHOUT CONTRAST TECHNIQUE: Contiguous axial images were obtained from the base of the skull through the vertex without intravenous contrast. COMPARISON:  None. FINDINGS: Brain: No evidence of acute infarction, hemorrhage, hydrocephalus, extra-axial collection, visible mass lesion or mass effect. Symmetric  prominence of the ventricles, cisterns and sulci compatible with parenchymal volume loss. Patchy areas of white matter hypoattenuation are most compatible with chronic microvascular angiopathy. Vascular: Atherosclerotic calcification of the carotid siphons. No hyperdense vessel. Skull: Mottled appearance of the bone particularly towards the clivus and sphenoid bones with multiple sites of lytic lesions, largest in the posterior left parietal bone measuring 19 x 8 mm (3/42) a slightly more expansile lytic focus is also seen in the right temporal bone measuring 15 x 7 mm (3/19) and in the right mastoid measuring 11 x 12 mm (3/9). Additional scattered foci elsewhere. No calvarial fracture. No scalp swelling or hematoma. Sinuses/Orbits: Nodular mural thickening in the paranasal sinuses, predominantly in the maxillary sinuses. Mastoid air cells are predominantly clear. Middle ear cavities are clear. Other: None IMPRESSION: 1. Mottled appearance of the calvaria and skull base with multitude of lytic lesions concerning for metastatic disease or myeloma. 2. No acute intracranial abnormality is seen within the limitations of an unenhanced CT. Background of chronic microvascular angiopathy and parenchymal volume loss. These results were called by telephone at the time of interpretation on 10/29/2020 at 10:54 pm to provider [facility] ZAMMIT , who verbally acknowledged these results. Electronically Signed   By: Lovena Le M.D.   On: 10/29/2020 22:56   CT Chest Wo Contrast  Result Date: 10/30/2020 CLINICAL DATA:  Cancer of unknown primary, multiple lytic lesions in the calvaria noted incidentally. Dizziness, unintended weight loss. EXAM: CT CHEST, ABDOMEN AND PELVIS WITHOUT CONTRAST TECHNIQUE: Multidetector CT imaging of the chest, abdomen and pelvis was performed  following the standard protocol without IV contrast. COMPARISON:  CT head 10/29/2020, chest radiograph 10/29/2020 FINDINGS: CT CHEST FINDINGS Cardiovascular:  Cardiomegaly. Coronary artery calcifications. Hypoattenuation of the cardiac blood pool compatible with anemia. The aortic root is suboptimally assessed given cardiac pulsation artifact. Atherosclerotic plaque within the normal caliber aorta. Shared origin of the brachiocephalic and left common carotid arteries. Proximal great vessels are otherwise unremarkable. Central pulmonary arteries are top-normal caliber. No large central filling defects within limitations of a nonenhanced exam. Mediastinum/Nodes: No mediastinal fluid or gas. Normal thyroid gland and thoracic inlet. No acute abnormality of the trachea or esophagus. No worrisome mediastinal or axillary adenopathy. Hilar nodal evaluation is limited in the absence of intravenous contrast media. Lungs/Pleura: No consolidation, features of edema, pneumothorax, or effusion. No suspicious pulmonary nodules or masses. Some dependent atelectasis. Evaluation slightly limited by mild respiratory motion artifact. Musculoskeletal: Widespread extensive lytic mottling throughout the axial and appendicular skeleton. Multiple rib fractures are seen in various stages of healing many of which are presumed to be pathologic in nature. Severe degenerative changes are present in the bilateral shoulders including larger heterotopic ossifications seen in the left shoulder recess. Bridging syndesmophytes across much of the thoracic levels and partial bridging across the spinous processes, could reflect some underlying spondyloarthropathy. CT ABDOMEN PELVIS FINDINGS Hepatobiliary: No visible focal liver lesion with limitations of an unenhanced CT. Smooth surface contour. Normal hepatic attenuation. Normal gallbladder and biliary tree without visible calcified gallstone. Pancreas: Mild pancreatic atrophy. No pancreatic ductal dilatation or surrounding inflammatory changes. Spleen: Splenomegaly within enlarged, lobular spleen. No visible focal splenic lesion within the limitations of  this unenhanced exam. Adrenals/Urinary Tract: No adrenal mass or hemorrhage. Slight under rotation of the bilateral kidneys, anatomic variant. No visible or contour deforming renal lesion. No urolithiasis or hydronephrosis. Urinary bladder is largely decompressed at the time of exam and therefore poorly evaluated by CT imaging. Mild circumferential bladder wall thickening may be related underdistention or chronic outlet obstruction given marked indentation of bladder base by an enlarged prostate. Stomach/Bowel: Distal esophagus, stomach and duodenum are unremarkable. No small bowel thickening or dilatation. Normal appendix in the right lower quadrant. Extensive distal colonic diverticulosis without active acute inflammation to suggest active diverticulitis. Some mild mural thickening in the sigmoid may be reflective of prior inflammation though warrants further evaluation with outpatient colonoscopy if not recently performed. Vascular/Lymphatic: Atherosclerotic calcifications within the abdominal aorta and branch vessels. No aneurysm or ectasia. No enlarged abdominopelvic lymph nodes. Reproductive: Enlarged prostate with few typically benign punctate calcifications. Trace right hydrocele. Other: No abdominopelvic free air or fluid. No bowel containing hernia. Musculoskeletal: Extensive lytic foci and bony remodeling throughout the axial and appendicular skeleton of the abdomen and pelvis including the proximal femora as well. Bony ankylosis of the L4-L5 and lumbosacral junction as well as across the bilateral SI joints. IMPRESSION: 1. Extensive lytic foci and bony mottling throughout the axial and appendicular skeleton of the chest, abdomen and pelvis including the proximal femora and humeri as well as multiple bilateral rib fractures in various stages of healing many of which are presumed to be pathologic in nature. Findings are concerning for a diffuse osseous metastatic disease versus multiple myeloma. 2. Mild  mural thickening in the sigmoid may be reflective of prior inflammation given the numerous colonic diverticula within the segment though warrants further evaluation with outpatient colonoscopy if not recently performed. 3. Splenomegaly within enlarged, lobular spleen, nonspecific but can be seen in the setting of myeloproliferative disorder. 4. Cardiomegaly and coronary  artery calcifications. Hypoattenuation of the cardiac blood pool suggestive of anemia. 5. Trace right hydrocele. 6. Mild circumferential bladder wall thickening may be related underdistention or chronic outlet obstruction given marked indentation of bladder base by an enlarged prostate. Could correlate with urinalysis to exclude cystitis. 7. Prostatomegaly. 8. Aortic Atherosclerosis (ICD10-I70.0). Electronically Signed   By: Lovena Le M.D.   On: 10/30/2020 01:15   DG Chest Port 1 View  Result Date: 10/29/2020 CLINICAL DATA:  Weakness EXAM: PORTABLE CHEST 1 VIEW COMPARISON:  None. FINDINGS: Cardiac shadow is enlarged. Lungs are well aerated bilaterally. Mild vascular congestion is noted without interstitial edema. No focal infiltrate or sizable effusion is seen. Degenerative changes of the shoulder joints and thoracic spine are noted. IMPRESSION: Mild vascular congestion without acute infiltrate or edema. Electronically Signed   By: Inez Catalina M.D.   On: 10/29/2020 23:07       Assessment & Plan:    Principal Problem:   AKI (acute kidney injury) ([place]) Active Problems:   Severe anemia   Acute metabolic encephalopathy   Hyponatremia   Moderate protein-calorie malnutrition (HCC)   Dehydration   Hypercalcemia   Abnormal CT of the head   1. Severe symptomatic anemia 1. Hemoglobin 3.9, down from 10.8 6 months ago 2. With endorgan damage including global ischemia on EKG, AKI, metabolic encephalopathy 3. FOBT negative 4. Anemia panel pending  5. Malnutrition and chronic disease likely contributing 6. Pan scan pending 7. Type  and screen and 2 units transfusion ordered, follow-up CBC after transfusion 8. Continue to monitor 2. Acute metabolic encephalopathy 1. Secondary to anemia and dehydration 2. Continue transfusion, 1 L fluids given in the ED, continue fluids at 125 mL/h 3. CT head showed no acute intracranial abnormality, but did have likely metastatic lesions in the calvaria 4. Continue to monitor 3. AKI 1. Secondary to dehydration which is most likely secondary to poor p.o. intake in the setting of altered mental status 2. Anemia also contributing 3. 1 L fluid given in the ED 4. Gentle hydration until transfusion is known to ensure that there is no over dilution of hemoglobin 5. Continue to monitor 4. Hypercalcemia 1. Fluids, Calcitonin and pamidronate  2.  continue to monitor 5. Dehydration 1. Most likely secondary to poor p.o. intake 2. Encourage p.o. intake, 1 L fluids given in the ED, continue hydration as above 6. Hyponatremia 1. Hypovolemic hyponatremia with a sodium 127 2. Continue hydration as above 3. Trend in the a.m. 7. Moderate protein calorie malnutrition 1. Encourage p.o. intake 2. Protein shakes between meals 3. Most likely secondary to poor p.o. intake at home 4. Continue to monitor 8. Hypertension 1. Currently holding amlodipine and metoprolol in the setting of dehydration and blood pressure 120s over 60 2. Continue to monitor 9. Abnormal CT head 1. Shows lytic lesion could be multiple myeloma versus other metastatic disease 2. Can scan to eval for any signs of primary neoplasm 3. Consult Dr. Delton Coombes 4. Consult palliative care for goals of care discussion 10. Hyperlipidemia 1. Continue statin   DVT Prophylaxis-    SCDs  AM Labs Ordered, also please review Full Orders  Family Communication: Admission, patients condition and plan of care including tests being ordered have been discussed with the patient and nephew Timmy who indicate understanding and agree with the plan,  CODE STATUS not discussed over the phone -especially after discussing likely malignancy.  Advised that palliative care will want to discuss goals of care tomorrow.  Nephew Timmy  would  like regular updates and can be reached at [phone]  Code Status: Full  Admission status: Inpatient :The appropriate admission status for this patient is INPATIENT. Inpatient status is judged to be reasonable and necessary in order to provide the required intensity of service to ensure the patient's safety. The patient's presenting symptoms, physical exam findings, and initial radiographic and laboratory data in the context of their chronic comorbidities is felt to place them at high risk for further clinical deterioration. Furthermore, it is not anticipated that the patient will be medically stable for discharge from the hospital within 2 midnights of admission. The following factors support the admission status of inpatient.     The patient's presenting symptoms include altered mental status The worrisome physical exam findings include clinically dehydrated The initial radiographic and laboratory data are worrisome because of hemoglobin 3.9, AKI The chronic co-morbidities include hypertension and hyperlipidemia     * I certify that at the point of admission it is my clinical judgment that the patient will require inpatient hospital care spanning beyond 2 midnights from the point of admission due to high intensity of service, high risk for further deterioration and high frequency of surveillance required.*  Time spent in minutes :Page

## 2020-10-30 NOTE — ED Notes (Signed)
Pt is experiencing altered mental status at this time, listed patient contact and available next of kin is [employer]. Telephone consent was performed with second nurse verification, Michiel Cowboy, Cleotis Nipper confirmed consent for patient to receive blood products, Timmy verbalizes understanding, no questions at this time. [place] CB# [phone]

## 2020-10-30 NOTE — Progress Notes (Signed)
Troponin called at 2220 on 10/30/20 of 3519. MD notified. Awaiting response/orders

## 2020-10-30 NOTE — ED Notes (Addendum)
Pt nephew Cleotis Nipper at bedside, reports that approximately 7-8 weeks ago pt was exposed to carbon monoxide via a leak in his tractor trailer, Timmy cannot confirm if pt received a positive covid test, reports pt has not been eating and has lost approx 40-50lbs over 8 weeks, and confirms pt has been having altered mental status intermittenly since CO exposure. Pt lives alone and prior to CO exposure was alert oriented and independent. Also reports pt told him he might have fallen 3-4 weeks ago, unwitnessed. Dr Roderic Palau made aware of conversation, Dr. Roderic Palau with Timmy at bedside.  [place] CB# [phone]

## 2020-10-30 NOTE — Progress Notes (Addendum)
PROGRESS NOTE   Paul Norton  QZR:[id number] DOB: 1955/11/10 DOA: 10/29/2020 PCP: Carlena Hurl, PA-C   Chief Complaint  Patient presents with  . Altered Mental Status   Level of care: ICU  Brief Admission History:  65 y.o. male, with history of HTN, HLD, and history of noncompliance with PCP advised normal presents to the ED with a chief complaint of confusion.  He was admitted with severe hypercalcemia, multiple bony lytic lesions in the calvarium and spinal column, acute renal failure, thrombocytopenia, severe anemia with hemoglobin of 3.9, severe dehydration, abnormal EKG, and severe encephalopathy.  Assessment & Plan:   Principal Problem:   Hypercalcemia Active Problems:   Essential hypertension, benign   Noncompliance   Hyperlipidemia   DOE (dyspnea on exertion)   AKI (acute kidney injury) (HCC)   Severe anemia   Acute metabolic encephalopathy   Hyponatremia   Moderate protein-calorie malnutrition (HCC)   Dehydration   Abnormal CT of the head   Thrombocytopenia (HCC)   Refuses treatment   Hypercalcemia of malignancy -Patient presents with severe calcium elevation -Improving with IV fluids, IV pamidronate given on 4/16, calcitonin - Continue to monitor closely, anticipate calcium will continue to follow over the next 24 hours as the pamidronate starts working  Acute metabolic encephalopathy - secondary to severe hypercalcemia - Mentation should improve as calcium is corrected  Severe anemia/thrombocytopenia - concerning for underlying MDS versus multiple myeloma - Hg up to 5.5 after 2 units PRBC transfusion. - I have ordered additional 2 units PRBC transfusion 4/17 - Follow CBC - No active bleeding found, stool was guaiac negative - holding all heparin for now  Acute renal failure - Pt presents with severe dehydration - continue IV fluid hydration -  Renally dose medications - Daily BMP testing  NSTEMI  - from demand ischemia related to very  severe anemia - HS troponin up to 2985 - Pt denies chest pain - EKG with ST depression, no ST elevation seen, reviewed with cardiologist at [facility] with cardiologist by [facility], would not start heparin or aspirin at this time given severe anemia and thrombocytopenia.  Recommend to get 2D echocardiogram and transfuse PRBC.  Ask inpatient cardio team to see in AM.   Hyponatremia  - from poor oral intake and severe depression - Treating with IV fluids, follow BMP.  Vitamin B12 deficiency - Start B12 injections  Elevated TSH - add on free T4  Question of multiple myeloma or MDS - SPEP pending - obtain 24 hour urine for UPEP, light chains - consult heme/onc in AM  Hyperglycemia  - Possibly stress induced - check A1c  Hyperlipidemia - by history, lipid panel in AM - resumed home rosuvastatin daily  Essential hypertension - resume metoprolol at reduced dose due to softer BPs   DVT prophylaxis: SCD Code Status: Full  Family Communication: nephew  Telephone update 4/17 Disposition: TBD Status is: Inpatient  Remains inpatient appropriate because:Persistent severe electrolyte disturbances, IV treatments appropriate due to intensity of illness or inability to take PO and Inpatient level of care appropriate due to severity of illness   Dispo: The patient is from: Home              Anticipated d/c is to: TBD              Patient currently is not medically stable to d/c.   Difficult to place patient No   Consultants:   Hem/onc   cardiology  Procedures:  Antimicrobials:     Subjective: Pt remains confused but denies chest pain, denies shortness of breath  Objective: Vitals:   10/30/20 0800 10/30/20 0826 10/30/20 1029 10/30/20 1045  BP: (!) 113/95  (!) 140/54   Pulse: 72  85 92  Resp: (!) 21  20 (!) 3  Temp:   98.6 F (37 C) 98.6 F (37 C)  TempSrc:   Axillary Axillary  SpO2: (!) 85% 93% (!) 86% 93%  Weight:      Height:        Intake/Output Summary  (Last 24 hours) at 10/30/2020 1122 Last data filed at 10/30/2020 1100 Gross per 24 hour  Intake 2161.37 ml  Output --  Net 2161.37 ml   Filed Weights   10/29/20 2224 10/30/20 0107  Weight: 70.4 kg 68.9 kg    Examination:  General exam: frail thin male, appears stated age, confused, mumbling at times, fidgeting constantly. Appears calm and comfortable  Respiratory system: Clear to auscultation. Respiratory effort normal. Cardiovascular system: normal S1 & S2 heard. No JVD, murmurs, rubs, gallops or clicks. No pedal edema. Gastrointestinal system: Abdomen is nondistended, soft and nontender. No organomegaly or masses felt. Normal bowel sounds heard. Central nervous system: Alert and confused/encephalopathic. No focal neurological deficits. Extremities: Symmetric 5 x 5 power. Skin: No rashes, lesions or ulcers Psychiatry: Judgement and insight appear poor. Mood & affect appear normal.   Data Reviewed: I have personally reviewed following labs and imaging studies  CBC: Recent Labs  Lab 10/29/20 2307 10/30/20 0931  WBC 5.9 5.1  NEUTROABS 3.3 3.6  HGB 3.9* 5.5*  HCT 12.0* 16.8*  MCV 121.2* 104.3*  PLT 125* 97*    Basic Metabolic Panel: Recent Labs  Lab 10/29/20 2307 10/30/20 0931  NA 127* 130*  K 3.8 4.3  CL 96* 100  CO2 24 25  GLUCOSE 129* 135*  BUN 47* 45*  CREATININE 4.50* 3.96*  CALCIUM 14.2* 12.8*  MG  --  1.7    GFR: Estimated Creatinine Clearance: 18.6 mL/min (A) (by C-G formula based on SCr of 3.96 mg/dL (H)).  Liver Function Tests: Recent Labs  Lab 10/29/20 2307 10/30/20 0931  AST 32 39  ALT 18 18  ALKPHOS 64 59  BILITOT 0.7 0.6  PROT 12.4* 11.2*  ALBUMIN 2.5* 2.2*    CBG: No results for input(s): GLUCAP in the last 168 hours.  Recent Results (from the past 240 hour(s))  Resp Panel by RT-PCR (Flu A&B, Covid) Nasopharyngeal Swab     Status: None   Collection Time: 10/29/20 10:28 PM   Specimen: Nasopharyngeal Swab; Nasopharyngeal(NP) swabs in  vial transport medium  Result Value Ref Range Status   SARS Coronavirus 2 by RT PCR NEGATIVE NEGATIVE Final    Comment: (NOTE) SARS-CoV-2 target nucleic acids are NOT DETECTED.  The SARS-CoV-2 RNA is generally detectable in upper respiratory specimens during the acute phase of infection. The lowest concentration of SARS-CoV-2 viral copies this assay can detect is 138 copies/mL. A negative result does not preclude SARS-Cov-2 infection and should not be used as the sole basis for treatment or other patient management decisions. A negative result may occur with  improper specimen collection/handling, submission of specimen other than nasopharyngeal swab, presence of viral mutation(s) within the areas targeted by this assay, and inadequate number of viral copies(<138 copies/mL). A negative result must be combined with clinical observations, patient history, and epidemiological information. The expected result is Negative.  Fact Sheet for Patients:  EntrepreneurPulse.com.au  Fact Sheet for Healthcare  Providers:  IncredibleEmployment.be  This test is no t yet approved or cleared by the [place] and  has been authorized for detection and/or diagnosis of SARS-CoV-2 by FDA under an Emergency Use Authorization (EUA). This EUA will remain  in effect (meaning this test can be used) for the duration of the COVID-19 declaration under Section 564(b)(1) of the Act, 21 U.S.C.section 360bbb-3(b)(1), unless the authorization is terminated  or revoked sooner.       Influenza A by PCR NEGATIVE NEGATIVE Final   Influenza B by PCR NEGATIVE NEGATIVE Final    Comment: (NOTE) The Xpert Xpress SARS-CoV-2/FLU/RSV plus assay is intended as an aid in the diagnosis of influenza from Nasopharyngeal swab specimens and should not be used as a sole basis for treatment. Nasal washings and aspirates are unacceptable for Xpert Xpress SARS-CoV-2/FLU/RSV testing.  Fact  Sheet for Patients: EntrepreneurPulse.com.au  Fact Sheet for Healthcare Providers: IncredibleEmployment.be  This test is not yet approved or cleared by the [place] FDA and has been authorized for detection and/or diagnosis of SARS-CoV-2 by FDA under an Emergency Use Authorization (EUA). This EUA will remain in effect (meaning this test can be used) for the duration of the COVID-19 declaration under Section 564(b)(1) of the Act, 21 U.S.C. section 360bbb-3(b)(1), unless the authorization is terminated or revoked.  Performed at [facility], [address] [place], [place] [address]   MRSA PCR Screening     Status: None   Collection Time: 10/30/20  1:25 AM   Specimen: Nasal Mucosa; Nasopharyngeal  Result Value Ref Range Status   MRSA by PCR NEGATIVE NEGATIVE Final    Comment:        The GeneXpert MRSA Assay (FDA approved for NASAL specimens only), is one component of a comprehensive MRSA colonization surveillance program. It is not intended to diagnose MRSA infection nor to guide or monitor treatment for MRSA infections. Performed at [facility], [address], [place], [place] [address]      Radiology Studies: CT ABDOMEN PELVIS WO CONTRAST  Result Date: 10/30/2020 CLINICAL DATA:  Cancer of unknown primary, multiple lytic lesions in the calvaria noted incidentally. Dizziness, unintended weight loss. EXAM: CT CHEST, ABDOMEN AND PELVIS WITHOUT CONTRAST TECHNIQUE: Multidetector CT imaging of the chest, abdomen and pelvis was performed following the standard protocol without IV contrast. COMPARISON:  CT head 10/29/2020, chest radiograph 10/29/2020 FINDINGS: CT CHEST FINDINGS Cardiovascular: Cardiomegaly. Coronary artery calcifications. Hypoattenuation of the cardiac blood pool compatible with anemia. The aortic root is suboptimally assessed given cardiac pulsation artifact. Atherosclerotic plaque within the normal caliber aorta. Shared origin  of the brachiocephalic and left common carotid arteries. Proximal great vessels are otherwise unremarkable. Central pulmonary arteries are top-normal caliber. No large central filling defects within limitations of a nonenhanced exam. Mediastinum/Nodes: No mediastinal fluid or gas. Normal thyroid gland and thoracic inlet. No acute abnormality of the trachea or esophagus. No worrisome mediastinal or axillary adenopathy. Hilar nodal evaluation is limited in the absence of intravenous contrast media. Lungs/Pleura: No consolidation, features of edema, pneumothorax, or effusion. No suspicious pulmonary nodules or masses. Some dependent atelectasis. Evaluation slightly limited by mild respiratory motion artifact. Musculoskeletal: Widespread extensive lytic mottling throughout the axial and appendicular skeleton. Multiple rib fractures are seen in various stages of healing many of which are presumed to be pathologic in nature. Severe degenerative changes are present in the bilateral shoulders including larger heterotopic ossifications seen in the left shoulder recess. Bridging syndesmophytes across much of the thoracic levels and partial bridging  across the spinous processes, could reflect some underlying spondyloarthropathy. CT ABDOMEN PELVIS FINDINGS Hepatobiliary: No visible focal liver lesion with limitations of an unenhanced CT. Smooth surface contour. Normal hepatic attenuation. Normal gallbladder and biliary tree without visible calcified gallstone. Pancreas: Mild pancreatic atrophy. No pancreatic ductal dilatation or surrounding inflammatory changes. Spleen: Splenomegaly within enlarged, lobular spleen. No visible focal splenic lesion within the limitations of this unenhanced exam. Adrenals/Urinary Tract: No adrenal mass or hemorrhage. Slight under rotation of the bilateral kidneys, anatomic variant. No visible or contour deforming renal lesion. No urolithiasis or hydronephrosis. Urinary bladder is largely  decompressed at the time of exam and therefore poorly evaluated by CT imaging. Mild circumferential bladder wall thickening may be related underdistention or chronic outlet obstruction given marked indentation of bladder base by an enlarged prostate. Stomach/Bowel: Distal esophagus, stomach and duodenum are unremarkable. No small bowel thickening or dilatation. Normal appendix in the right lower quadrant. Extensive distal colonic diverticulosis without active acute inflammation to suggest active diverticulitis. Some mild mural thickening in the sigmoid may be reflective of prior inflammation though warrants further evaluation with outpatient colonoscopy if not recently performed. Vascular/Lymphatic: Atherosclerotic calcifications within the abdominal aorta and branch vessels. No aneurysm or ectasia. No enlarged abdominopelvic lymph nodes. Reproductive: Enlarged prostate with few typically benign punctate calcifications. Trace right hydrocele. Other: No abdominopelvic free air or fluid. No bowel containing hernia. Musculoskeletal: Extensive lytic foci and bony remodeling throughout the axial and appendicular skeleton of the abdomen and pelvis including the proximal femora as well. Bony ankylosis of the L4-L5 and lumbosacral junction as well as across the bilateral SI joints. IMPRESSION: 1. Extensive lytic foci and bony mottling throughout the axial and appendicular skeleton of the chest, abdomen and pelvis including the proximal femora and humeri as well as multiple bilateral rib fractures in various stages of healing many of which are presumed to be pathologic in nature. Findings are concerning for a diffuse osseous metastatic disease versus multiple myeloma. 2. Mild mural thickening in the sigmoid may be reflective of prior inflammation given the numerous colonic diverticula within the segment though warrants further evaluation with outpatient colonoscopy if not recently performed. 3. Splenomegaly within enlarged,  lobular spleen, nonspecific but can be seen in the setting of myeloproliferative disorder. 4. Cardiomegaly and coronary artery calcifications. Hypoattenuation of the cardiac blood pool suggestive of anemia. 5. Trace right hydrocele. 6. Mild circumferential bladder wall thickening may be related underdistention or chronic outlet obstruction given marked indentation of bladder base by an enlarged prostate. Could correlate with urinalysis to exclude cystitis. 7. Prostatomegaly. 8. Aortic Atherosclerosis (ICD10-I70.0). Electronically Signed   By: Lovena Le M.D.   On: 10/30/2020 01:15   CT Head Wo Contrast  Result Date: 10/29/2020 CLINICAL DATA:  Dizziness, carbon monoxide exposure 2 months prior with COVID few weeks ago. Unintended weight loss. EXAM: CT HEAD WITHOUT CONTRAST TECHNIQUE: Contiguous axial images were obtained from the base of the skull through the vertex without intravenous contrast. COMPARISON:  None. FINDINGS: Brain: No evidence of acute infarction, hemorrhage, hydrocephalus, extra-axial collection, visible mass lesion or mass effect. Symmetric prominence of the ventricles, cisterns and sulci compatible with parenchymal volume loss. Patchy areas of white matter hypoattenuation are most compatible with chronic microvascular angiopathy. Vascular: Atherosclerotic calcification of the carotid siphons. No hyperdense vessel. Skull: Mottled appearance of the bone particularly towards the clivus and sphenoid bones with multiple sites of lytic lesions, largest in the posterior left parietal bone measuring 19 x 8 mm (3/42) a slightly more expansile  lytic focus is also seen in the right temporal bone measuring 15 x 7 mm (3/19) and in the right mastoid measuring 11 x 12 mm (3/9). Additional scattered foci elsewhere. No calvarial fracture. No scalp swelling or hematoma. Sinuses/Orbits: Nodular mural thickening in the paranasal sinuses, predominantly in the maxillary sinuses. Mastoid air cells are predominantly  clear. Middle ear cavities are clear. Other: None IMPRESSION: 1. Mottled appearance of the calvaria and skull base with multitude of lytic lesions concerning for metastatic disease or myeloma. 2. No acute intracranial abnormality is seen within the limitations of an unenhanced CT. Background of chronic microvascular angiopathy and parenchymal volume loss. These results were called by telephone at the time of interpretation on 10/29/2020 at 10:54 pm to provider [facility] , who verbally acknowledged these results. Electronically Signed   By: Lovena Le M.D.   On: 10/29/2020 22:56   CT Chest Wo Contrast  Result Date: 10/30/2020 CLINICAL DATA:  Cancer of unknown primary, multiple lytic lesions in the calvaria noted incidentally. Dizziness, unintended weight loss. EXAM: CT CHEST, ABDOMEN AND PELVIS WITHOUT CONTRAST TECHNIQUE: Multidetector CT imaging of the chest, abdomen and pelvis was performed following the standard protocol without IV contrast. COMPARISON:  CT head 10/29/2020, chest radiograph 10/29/2020 FINDINGS: CT CHEST FINDINGS Cardiovascular: Cardiomegaly. Coronary artery calcifications. Hypoattenuation of the cardiac blood pool compatible with anemia. The aortic root is suboptimally assessed given cardiac pulsation artifact. Atherosclerotic plaque within the normal caliber aorta. Shared origin of the brachiocephalic and left common carotid arteries. Proximal great vessels are otherwise unremarkable. Central pulmonary arteries are top-normal caliber. No large central filling defects within limitations of a nonenhanced exam. Mediastinum/Nodes: No mediastinal fluid or gas. Normal thyroid gland and thoracic inlet. No acute abnormality of the trachea or esophagus. No worrisome mediastinal or axillary adenopathy. Hilar nodal evaluation is limited in the absence of intravenous contrast media. Lungs/Pleura: No consolidation, features of edema, pneumothorax, or effusion. No suspicious pulmonary nodules or  masses. Some dependent atelectasis. Evaluation slightly limited by mild respiratory motion artifact. Musculoskeletal: Widespread extensive lytic mottling throughout the axial and appendicular skeleton. Multiple rib fractures are seen in various stages of healing many of which are presumed to be pathologic in nature. Severe degenerative changes are present in the bilateral shoulders including larger heterotopic ossifications seen in the left shoulder recess. Bridging syndesmophytes across much of the thoracic levels and partial bridging across the spinous processes, could reflect some underlying spondyloarthropathy. CT ABDOMEN PELVIS FINDINGS Hepatobiliary: No visible focal liver lesion with limitations of an unenhanced CT. Smooth surface contour. Normal hepatic attenuation. Normal gallbladder and biliary tree without visible calcified gallstone. Pancreas: Mild pancreatic atrophy. No pancreatic ductal dilatation or surrounding inflammatory changes. Spleen: Splenomegaly within enlarged, lobular spleen. No visible focal splenic lesion within the limitations of this unenhanced exam. Adrenals/Urinary Tract: No adrenal mass or hemorrhage. Slight under rotation of the bilateral kidneys, anatomic variant. No visible or contour deforming renal lesion. No urolithiasis or hydronephrosis. Urinary bladder is largely decompressed at the time of exam and therefore poorly evaluated by CT imaging. Mild circumferential bladder wall thickening may be related underdistention or chronic outlet obstruction given marked indentation of bladder base by an enlarged prostate. Stomach/Bowel: Distal esophagus, stomach and duodenum are unremarkable. No small bowel thickening or dilatation. Normal appendix in the right lower quadrant. Extensive distal colonic diverticulosis without active acute inflammation to suggest active diverticulitis. Some mild mural thickening in the sigmoid may be reflective of prior inflammation though warrants further  evaluation with outpatient colonoscopy if  not recently performed. Vascular/Lymphatic: Atherosclerotic calcifications within the abdominal aorta and branch vessels. No aneurysm or ectasia. No enlarged abdominopelvic lymph nodes. Reproductive: Enlarged prostate with few typically benign punctate calcifications. Trace right hydrocele. Other: No abdominopelvic free air or fluid. No bowel containing hernia. Musculoskeletal: Extensive lytic foci and bony remodeling throughout the axial and appendicular skeleton of the abdomen and pelvis including the proximal femora as well. Bony ankylosis of the L4-L5 and lumbosacral junction as well as across the bilateral SI joints. IMPRESSION: 1. Extensive lytic foci and bony mottling throughout the axial and appendicular skeleton of the chest, abdomen and pelvis including the proximal femora and humeri as well as multiple bilateral rib fractures in various stages of healing many of which are presumed to be pathologic in nature. Findings are concerning for a diffuse osseous metastatic disease versus multiple myeloma. 2. Mild mural thickening in the sigmoid may be reflective of prior inflammation given the numerous colonic diverticula within the segment though warrants further evaluation with outpatient colonoscopy if not recently performed. 3. Splenomegaly within enlarged, lobular spleen, nonspecific but can be seen in the setting of myeloproliferative disorder. 4. Cardiomegaly and coronary artery calcifications. Hypoattenuation of the cardiac blood pool suggestive of anemia. 5. Trace right hydrocele. 6. Mild circumferential bladder wall thickening may be related underdistention or chronic outlet obstruction given marked indentation of bladder base by an enlarged prostate. Could correlate with urinalysis to exclude cystitis. 7. Prostatomegaly. 8. Aortic Atherosclerosis (ICD10-I70.0). Electronically Signed   By: Lovena Le M.D.   On: 10/30/2020 01:15   DG Chest Port 1  View  Result Date: 10/29/2020 CLINICAL DATA:  Weakness EXAM: PORTABLE CHEST 1 VIEW COMPARISON:  None. FINDINGS: Cardiac shadow is enlarged. Lungs are well aerated bilaterally. Mild vascular congestion is noted without interstitial edema. No focal infiltrate or sizable effusion is seen. Degenerative changes of the shoulder joints and thoracic spine are noted. IMPRESSION: Mild vascular congestion without acute infiltrate or edema. Electronically Signed   By: Inez Catalina M.D.   On: 10/29/2020 23:07    Scheduled Meds: . sodium chloride   Intravenous Once  . calcitonin  4 Units/kg Subcutaneous BID  . Chlorhexidine Gluconate Cloth  6 each Topical Daily  . feeding supplement  237 mL Oral BID BM  . fluticasone furoate-vilanterol  1 puff Inhalation Daily  . rosuvastatin  10 mg Oral QHS   Continuous Infusions: . sodium chloride Stopped (10/30/20 0123)     LOS: 0 days   Critical Care Procedure Note Authorized and Performed by: Murvin Natal MD  Total Critical Care time:  60  mins Due to a high probability of clinically significant, life threatening deterioration, the patient required my highest level of preparedness to intervene emergently and I personally spent this critical care time directly and personally managing the patient.  This critical care time included obtaining a history; examining the patient, pulse oximetry; ordering and review of studies; arranging urgent treatment with development of a management plan; evaluation of patient's response of treatment; frequent reassessment; and discussions with other providers.  This critical care time was performed to assess and manage the high probability of imminent and life threatening deterioration that could result in multi-organ failure.  It was exclusive of separately billable procedures and treating other patients and teaching time.    Irwin Brakeman, MD How to contact the [facility] Attending or Consulting provider [place] or covering provider during  after hours [place], for this patient?  1. Check the care team in [facility]  and look for a) attending/consulting [place] provider listed and b) the [facility] team listed 2. Log into [email or web address] and use [place]'s universal password to access. If you do not have the password, please contact the hospital operator. 3. Locate the [facility] provider you are looking for under [facility] and page to a number that you can be directly reached. 4. If you still have difficulty reaching the provider, please page the [facility] (Director on Call) for the Hospitalists listed on amion for assistance.  10/30/2020, 11:22 AM

## 2020-10-30 NOTE — ED Notes (Addendum)
Told Drs A. Zierle-Ghosh and S. Rancour that nephew would like to be updated, contact information noted in chart and previous notes. Nephew aware that pt is being admitted, provided information and made him aware that MD should be calling to update.

## 2020-10-30 NOTE — Progress Notes (Signed)
Called Dr. Wynetta Emery with hemoglobin of 5.5 ordered 2 more units of blood.

## 2020-10-30 NOTE — Progress Notes (Signed)
*  PRELIMINARY RESULTS* Echocardiogram 2D Echocardiogram has been performed.  Paul Norton 10/30/2020, 11:50 AM

## 2020-10-31 ENCOUNTER — Inpatient Hospital Stay (HOSPITAL_COMMUNITY): Payer: Commercial Managed Care - PPO

## 2020-10-31 ENCOUNTER — Encounter (HOSPITAL_COMMUNITY): Payer: Self-pay | Admitting: Family Medicine

## 2020-10-31 DIAGNOSIS — R778 Other specified abnormalities of plasma proteins: Secondary | ICD-10-CM

## 2020-10-31 DIAGNOSIS — D649 Anemia, unspecified: Secondary | ICD-10-CM

## 2020-10-31 DIAGNOSIS — Z515 Encounter for palliative care: Secondary | ICD-10-CM

## 2020-10-31 DIAGNOSIS — D696 Thrombocytopenia, unspecified: Secondary | ICD-10-CM

## 2020-10-31 DIAGNOSIS — I214 Non-ST elevation (NSTEMI) myocardial infarction: Secondary | ICD-10-CM

## 2020-10-31 DIAGNOSIS — G9341 Metabolic encephalopathy: Secondary | ICD-10-CM

## 2020-10-31 DIAGNOSIS — C799 Secondary malignant neoplasm of unspecified site: Secondary | ICD-10-CM

## 2020-10-31 DIAGNOSIS — R930 Abnormal findings on diagnostic imaging of skull and head, not elsewhere classified: Secondary | ICD-10-CM

## 2020-10-31 LAB — TYPE AND SCREEN
ABO/RH(D): B POS
Antibody Screen: NEGATIVE
Unit division: 0
Unit division: 0
Unit division: 0
Unit division: 0

## 2020-10-31 LAB — CBC
HCT: 22.9 % — ABNORMAL LOW (ref 39.0–52.0)
Hemoglobin: 7.7 g/dL — ABNORMAL LOW (ref 13.0–17.0)
MCH: 32.9 pg (ref 26.0–34.0)
MCHC: 33.6 g/dL (ref 30.0–36.0)
MCV: 97.9 fL (ref 80.0–100.0)
Platelets: 93 10*3/uL — ABNORMAL LOW (ref 150–400)
RBC: 2.34 MIL/uL — ABNORMAL LOW (ref 4.22–5.81)
RDW: 23.1 % — ABNORMAL HIGH (ref 11.5–15.5)
WBC: 7.2 10*3/uL (ref 4.0–10.5)
nRBC: 0 % (ref 0.0–0.2)

## 2020-10-31 LAB — TROPONIN I (HIGH SENSITIVITY)
Troponin I (High Sensitivity): 3537 ng/L (ref <18)
Troponin I (High Sensitivity): 3946 ng/L (ref <18)
Troponin I (High Sensitivity): 3336 ng/L (ref <18)

## 2020-10-31 LAB — COMPREHENSIVE METABOLIC PANEL
ALT: 16 U/L (ref 0–44)
AST: 41 U/L (ref 15–41)
Albumin: 2 g/dL — ABNORMAL LOW (ref 3.5–5.0)
Alkaline Phosphatase: 51 U/L (ref 38–126)
Anion gap: 4 — ABNORMAL LOW (ref 5–15)
BUN: 48 mg/dL — ABNORMAL HIGH (ref 8–23)
CO2: 24 mmol/L (ref 22–32)
Calcium: 11.5 mg/dL — ABNORMAL HIGH (ref 8.9–10.3)
Chloride: 103 mmol/L (ref 98–111)
Creatinine, Ser: 4.05 mg/dL — ABNORMAL HIGH (ref 0.61–1.24)
GFR, Estimated: 16 mL/min — ABNORMAL LOW (ref >60)
Glucose, Bld: 92 mg/dL (ref 70–99)
Potassium: 3.8 mmol/L (ref 3.5–5.1)
Sodium: 131 mmol/L — ABNORMAL LOW (ref 135–145)
Total Bilirubin: 0.7 mg/dL (ref 0.3–1.2)
Total Protein: 10.3 g/dL — ABNORMAL HIGH (ref 6.5–8.1)

## 2020-10-31 LAB — BPAM RBC
Blood Product Expiration Date: 202205192359
Blood Product Expiration Date: 202205192359
Blood Product Expiration Date: 202205232359
Blood Product Expiration Date: 202205232359
ISSUE DATE / TIME: 202204170239
ISSUE DATE / TIME: 202204170508
ISSUE DATE / TIME: 202204171020
ISSUE DATE / TIME: 202204171348
Unit Type and Rh: 1700
Unit Type and Rh: 5100
Unit Type and Rh: 5100
Unit Type and Rh: 5100

## 2020-10-31 LAB — CALCIUM, IONIZED: Calcium, Ionized, Serum: 7.8 mg/dL — ABNORMAL HIGH (ref 4.5–5.6)

## 2020-10-31 LAB — HEMOGLOBIN A1C
Hgb A1c MFr Bld: 5.6 % (ref 4.8–5.6)
Mean Plasma Glucose: 114 mg/dL

## 2020-10-31 LAB — PSA: Prostatic Specific Antigen: 3.36 ng/mL (ref 0.00–4.00)

## 2020-10-31 LAB — TSH: TSH: 4.033 u[IU]/mL (ref 0.350–4.500)

## 2020-10-31 LAB — LACTATE DEHYDROGENASE: LDH: 115 U/L (ref 98–192)

## 2020-10-31 LAB — MAGNESIUM: Magnesium: 1.5 mg/dL — ABNORMAL LOW (ref 1.7–2.4)

## 2020-10-31 LAB — BRAIN NATRIURETIC PEPTIDE: B Natriuretic Peptide: 883 pg/mL — ABNORMAL HIGH (ref 0.0–100.0)

## 2020-10-31 MED ORDER — MAGNESIUM SULFATE 4 GM/100ML IV SOLN
4.0000 g | Freq: Once | INTRAVENOUS | Status: AC
  Administered 2020-10-31: 4 g via INTRAVENOUS
  Filled 2020-10-31: qty 100

## 2020-10-31 MED ORDER — CYANOCOBALAMIN 1000 MCG/ML IJ SOLN
1000.0000 ug | Freq: Every day | INTRAMUSCULAR | Status: AC
  Administered 2020-10-31 – 2020-11-01 (×2): 1000 ug via INTRAMUSCULAR
  Filled 2020-10-31 (×2): qty 1

## 2020-10-31 MED ORDER — FUROSEMIDE 10 MG/ML IJ SOLN
40.0000 mg | Freq: Once | INTRAMUSCULAR | Status: AC
  Administered 2020-10-31: 40 mg via INTRAVENOUS
  Filled 2020-10-31: qty 4

## 2020-10-31 MED ORDER — SODIUM CHLORIDE 0.9 % IV BOLUS
500.0000 mL | Freq: Once | INTRAVENOUS | Status: AC
  Administered 2020-10-31: 500 mL via INTRAVENOUS

## 2020-10-31 NOTE — Progress Notes (Signed)
Palliative: Thank you for this consult. Palliative Medicine will consult with Paul Norton after visit from Oncology, anticipate 11/01/2020. Conference with attending and bedside nursing staff.  No charge  Quinn Axe, NP Palliative Medicine Please call Palliative Medicine team phone with any questions [phone]. For individual providers please see [facility].

## 2020-10-31 NOTE — Progress Notes (Signed)
MD notified about patient's hypotension. Awaiting orders/response.

## 2020-10-31 NOTE — Progress Notes (Addendum)
Initial Nutrition Assessment  DOCUMENTATION CODES:      INTERVENTION:  When patient is able to take nutrition orally will make additional recommendations   NUTRITION DIAGNOSIS:   Inadequate oral intake related to inability to eat (confusion, agitation) as evidenced by nursing report-pt spitting out Ensure.    GOAL:  Provide needs based on ASPEN/SCCM guidelines  MONITOR:  Diet advancement,I & O's,Labs,Weight trends    REASON FOR ASSESSMENT:   Malnutrition Screening Tool    ASSESSMENT: Patient is a 65 yo male with hypertension, hyperlipidemia and non compliance who presents with altered mental status.    Per MD-Severe hypercalcemia, multiple bony lytic lesions in the calvarium and spinal column, acute renal failure, severe anemia (hgb 3.9), severe dehydration and severe encephalopathy. Palliative consult pending. Bone marrow biopsy scheduled at [facility] in the morning and will return to [facility]. NPO after midnight.  Diet is regular- talked with patients brother. He has not seen him in a while and therefore unable to provide diet history. Patient is unable to participate today. Talked with nursing who reports him spitting out Ensure this morning and did not eat any lunch.    Weight loss based on chart review. He weighed 88.5 kg in mid September and today weighs 70.9 kg. Loss of 20% x 7 months which is severe.   Medications: B-12  IV-NS @ 150 ml/hr  Labs: BMP Latest Ref Rng & Units 10/31/2020 10/30/2020 10/29/2020  Glucose 70 - 99 mg/dL 92 135(H) 129(H)  BUN 8 - 23 mg/dL 48(H) 45(H) 47(H)  Creatinine 0.61 - 1.24 mg/dL 4.05(H) 3.96(H) 4.50(H)  BUN/Creat Ratio 10 - 24 - - -  Sodium 135 - 145 mmol/L 131(L) 130(L) 127(L)  Potassium 3.5 - 5.1 mmol/L 3.8 4.3 3.8  Chloride 98 - 111 mmol/L 103 100 96(L)  CO2 22 - 32 mmol/L _0 Calcium 8.9 - 10.3 mg/dL 11.5(H) 12.8(H) 14.2(HH)     NUTRITION - FOCUSED PHYSICAL EXAM: Deferred due to patient agitation  Diet Order:   Diet Order             Diet NPO time specified Except for: Sips with Meds  Diet effective midnight           Diet regular Room service appropriate? Yes; Fluid consistency: Thin  Diet effective now                 EDUCATION NEEDS:  Not appropriate for education at this time  Skin:  Skin Assessment: Reviewed RN Assessment  Last BM:  Prior to admission  Height:   Ht Readings from Last 1 Encounters:  10/30/20 _1  (1.753 m)    Weight:   Wt Readings from Last 1 Encounters:  10/31/20 70.9 kg    Ideal Body Weight:   66 kg  BMI:  Body mass index is 23.08 kg/m.  Estimated Nutritional Needs:   Kcal:  1610-9604  Protein:  92-99 gr  Fluid:  >2100 ml daily   Colman Cater MS,RD,CSG,LDN Contact: Shea Evans

## 2020-10-31 NOTE — Consult Note (Signed)
[facility] Consultation Oncology  Name: Paul Norton      MRN: [id number]    Location: IC05/IC05-01  Date: 10/31/2020 Time:6:20 PM   REFERRING PHYSICIAN: Dr. Wynetta Emery  REASON FOR CONSULT: Malignant hypercalcemia   DIAGNOSIS: Malignant hypercalcemia secondary to multiple myeloma  HISTORY OF PRESENT ILLNESS: Paul Norton is a 65 year old white male seen in consultation today at the request of Dr. Wynetta Emery for further work-up and management of malignant hypercalcemia.  This patient was found to be confused by his nephew and EMS was called.  On arrival found to have hemoglobin 3.9 with MCV 121 and acute renal failure with a creatinine of 4.5 and hypercalcemia of 14.2.  Albumin was 2.5.  He has received blood transfusions.  He also received Aredia and is currently receiving calcitonin.  He is severely confused.  He was also found to have elevated troponins.  His vitamin B12 was low at 106 on admission with normal folic acid and ferritin levels.  I have obtained history from the chart as well as patient's brother and his sister-in-law who are at bedside.  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  He lives by himself.  Family history significant for mother with breast and ovarian cancers and father with metastatic kidney cancer.  Patient's niece also has CML.  PAST MEDICAL HISTORY:   Past Medical History:  Diagnosis Date  . Colonoscopy refused 09/2017  . Hyperlipidemia   . Hypertension   . Refuses treatment 09/2017   refuses screenings such as cancer screening, refuses vaccines, refuses normal preventative care  . Vaccine refused by patient    all vaccines as of 09/2017    ALLERGIES: No Known Allergies    MEDICATIONS: I have reviewed the patient's current medications.     PAST SURGICAL HISTORY Past Surgical History:  Procedure Laterality Date  . COLONOSCOPY     never, declines as of 09/2017    FAMILY HISTORY: History reviewed. No pertinent family  history.  SOCIAL HISTORY:  reports that he has never smoked. He has never used smokeless tobacco. He reports that he does not drink alcohol and does not use drugs.  PERFORMANCE STATUS: The patient's performance status is 3 - Symptomatic, >50% confined to bed  PHYSICAL EXAM: Most Recent Vital Signs: Blood pressure (!) 126/57, pulse (!) 57, temperature 99.5 F (37.5 C), temperature source Oral, resp. rate 15, height _0  (1.753 m), weight 156 lb 4.9 oz (70.9 kg), SpO2 92 %. BP (!) 105/44   Pulse (!) 57   Temp 99.5 F (37.5 C) (Oral)   Resp 14   Ht _1  (1.753 m)   Wt 156 lb 4.9 oz (70.9 kg)   SpO2 94%   BMI 23.08 kg/m  General appearance: alert, appears stated age and delirious Lungs: clear to auscultation bilaterally Heart: regular rate and rhythm Abdomen: soft, non-tender; bowel sounds normal; no masses,  no organomegaly Extremities: No edema cyanosis. Skin: Skin color, texture, turgor normal. No rashes or lesions Lymph nodes: No palpable adenopathy in the neck, axilla or inguinal region. Neurologic: He is awake alert, disoriented to place and person and time.  No focal neurological deficits.  LABORATORY DATA:  Results for orders placed or performed during the hospital encounter of 10/29/20 (from the past 48 hour(s))  Resp Panel by RT-PCR (Flu A&B, Covid) Nasopharyngeal Swab     Status: None   Collection Time: 10/29/20 10:28 PM   Specimen: Nasopharyngeal Swab; Nasopharyngeal(NP) swabs in vial transport medium  Result Value Ref Range   SARS Coronavirus 2 by RT PCR NEGATIVE NEGATIVE    Comment: (NOTE) SARS-CoV-2 target nucleic acids are NOT DETECTED.  The SARS-CoV-2 RNA is generally detectable in upper respiratory specimens during the acute phase of infection. The lowest concentration of SARS-CoV-2 viral copies this assay can detect is 138 copies/mL. A negative result does not preclude SARS-Cov-2 infection and should not be used as the sole basis for treatment or other  patient management decisions. A negative result may occur with  improper specimen collection/handling, submission of specimen other than nasopharyngeal swab, presence of viral mutation(s) within the areas targeted by this assay, and inadequate number of viral copies(<138 copies/mL). A negative result must be combined with clinical observations, patient history, and epidemiological information. The expected result is Negative.  Fact Sheet for Patients:  EntrepreneurPulse.com.au  Fact Sheet for Healthcare Providers:  IncredibleEmployment.be  This test is no t yet approved or cleared by the [place] FDA and  has been authorized for detection and/or diagnosis of SARS-CoV-2 by FDA under an Emergency Use Authorization (EUA). This EUA will remain  in effect (meaning this test can be used) for the duration of the COVID-19 declaration under Section 564(b)(1) of the Act, 21 U.S.C.section 360bbb-3(b)(1), unless the authorization is terminated  or revoked sooner.       Influenza A by PCR NEGATIVE NEGATIVE   Influenza B by PCR NEGATIVE NEGATIVE    Comment: (NOTE) The Xpert Xpress SARS-CoV-2/FLU/RSV plus assay is intended as an aid in the diagnosis of influenza from Nasopharyngeal swab specimens and should not be used as a sole basis for treatment. Nasal washings and aspirates are unacceptable for Xpert Xpress SARS-CoV-2/FLU/RSV testing.  Fact Sheet for Patients: EntrepreneurPulse.com.au  Fact Sheet for Healthcare Providers: IncredibleEmployment.be  This test is not yet approved or cleared by the [place] FDA and has been authorized for detection and/or diagnosis of SARS-CoV-2 by FDA under an Emergency Use Authorization (EUA). This EUA will remain in effect (meaning this test can be used) for the duration of the COVID-19 declaration under Section 564(b)(1) of the Act, 21 U.S.C. section 360bbb-3(b)(1), unless  the authorization is terminated or revoked.  Performed at [facility], [address], [place],  [address]   CBC with Differential/Platelet     Status: Abnormal   Collection Time: 10/29/20 11:07 PM  Result Value Ref Range   WBC 5.9 4.0 - 10.5 K/uL   RBC 0.99 (L) 4.22 - 5.81 MIL/uL   Hemoglobin 3.9 (LL) 13.0 - 17.0 g/dL    Comment: This critical result has verified and been called to [facility] by Duncan Dull on 04 16 2022 at 2330, and has been read back.    HCT 12.0 (L) 39.0 - 52.0 %   MCV 121.2 (H) 80.0 - 100.0 fL   MCH 39.4 (H) 26.0 - 34.0 pg   MCHC 32.5 30.0 - 36.0 g/dL   RDW 14.3 11.5 - 15.5 %   Platelets 125 (L) 150 - 400 K/uL   nRBC 0.0 0.0 - 0.2 %   Neutrophils Relative % 56 %   Neutro Abs 3.3 1.7 - 7.7 K/uL   Lymphocytes Relative 28 %   Lymphs Abs 1.7 0.7 - 4.0 K/uL   Monocytes Relative 16 %   Monocytes Absolute 0.9 0.1 - 1.0 K/uL   Eosinophils Relative 0 %   Eosinophils Absolute 0.0 0.0 - 0.5 K/uL   Basophils Relative 0 %   Basophils Absolute 0.0 0.0 - 0.1 K/uL  WBC Morphology Abnormal lymphocytes present     Comment: Performed at [facility], [address] [place], [place] [address]  Comprehensive metabolic panel     Status: Abnormal   Collection Time: 10/29/20 11:07 PM  Result Value Ref Range   Sodium 127 (L) 135 - 145 mmol/L   Potassium 3.8 3.5 - 5.1 mmol/L   Chloride 96 (L) 98 - 111 mmol/L   CO2 24 22 - 32 mmol/L   Glucose, Bld 129 (H) 70 - 99 mg/dL    Comment: Glucose reference range applies only to samples taken after fasting for at least 8 hours.   BUN 47 (H) 8 - 23 mg/dL   Creatinine, Ser 4.50 (H) 0.61 - 1.24 mg/dL   Calcium 14.2 (HH) 8.9 - 10.3 mg/dL    Comment: RESULT VERIFIED WITH REPEAT COLLECTION AND ANALYSIS CRITICAL RESULT CALLED TO, READ BACK BY AND VERIFIED WITH: Lynann Bologna, A 2358 10/29/2020 COLEMAN,R    Total Protein 12.4 (H) 6.5 - 8.1 g/dL   Albumin 2.5 (L) 3.5 - 5.0 g/dL   AST 32 15 - 41 U/L   ALT 18 0 - 44 U/L   Alkaline  Phosphatase 64 38 - 126 U/L   Total Bilirubin 0.7 0.3 - 1.2 mg/dL   GFR, Estimated 14 (L) >60 mL/min    Comment: (NOTE) Calculated using the CKD-EPI Creatinine Equation (2021)    Anion gap 7 5 - 15    Comment: Performed at [facility], [address], [place], [place] [address]  Troponin I (High Sensitivity)     Status: None   Collection Time: 10/29/20 11:07 PM  Result Value Ref Range   Troponin I (High Sensitivity) 12 <18 ng/L    Comment: (NOTE) Elevated high sensitivity troponin I (hsTnI) values and significant  changes across serial measurements may suggest ACS but many other  chronic and acute conditions are known to elevate hsTnI results.  Refer to the Links section for chest pain algorithms and additional  guidance. Performed at [facility], [address] [place], [place] [address]   Ammonia     Status: None   Collection Time: 10/29/20 11:07 PM  Result Value Ref Range   Ammonia 13 9 - 35 umol/L    Comment: Performed at [facility], [address] [place], [place] [address]  TSH     Status: Abnormal   Collection Time: 10/29/20 11:07 PM  Result Value Ref Range   TSH 5.278 (H) 0.350 - 4.500 uIU/mL    Comment: Performed by a 3rd Generation assay with a functional sensitivity of <=0.01 uIU/mL. Performed at [facility], [address], [place], [place] [address]   CK     Status: Abnormal   Collection Time: 10/29/20 11:07 PM  Result Value Ref Range   Total CK 17 (L) 49 - 397 U/L    Comment: Performed at [facility], [address] [place], [place] [address]  Type and screen [facility]     Status: None   Collection Time: 10/29/20 11:07 PM  Result Value Ref Range   ABO/RH(D) B POS    Antibody Screen NEG    Sample Expiration 11/01/2020,2359    Unit Number [id number]    Blood Component Type RED CELLS,LR    Unit division 00    Status of Unit ISSUED,FINAL    Transfusion Status OK TO TRANSFUSE    Crossmatch Result Compatible    Unit Number [id number]     Blood Component Type RED CELLS,LR  Unit division 00    Status of Unit ISSUED,FINAL    Transfusion Status OK TO TRANSFUSE    Crossmatch Result Compatible    Unit Number [id number]    Blood Component Type RBC LR PHER1    Unit division 00    Status of Unit ISSUED,FINAL    Transfusion Status OK TO TRANSFUSE    Crossmatch Result      Compatible Performed at [facility], [address] [place], [place] [address]    Unit Number [id number]    Blood Component Type RED CELLS,LR    Unit division 00    Status of Unit ISSUED,FINAL    Transfusion Status OK TO TRANSFUSE    Crossmatch Result Compatible   Vitamin B12     Status: Abnormal   Collection Time: 10/29/20 11:07 PM  Result Value Ref Range   Vitamin B-12 106 (L) 180 - 914 pg/mL    Comment: (NOTE) This assay is not validated for testing neonatal or myeloproliferative syndrome specimens for Vitamin B12 levels. Performed at [facility], [address], [place], [place] [address]   Folate     Status: None   Collection Time: 10/29/20 11:07 PM  Result Value Ref Range   Folate 8.3 >5.9 ng/mL    Comment: Performed at [facility], [address] [place], [place] [address]  Iron and TIBC     Status: Abnormal   Collection Time: 10/29/20 11:07 PM  Result Value Ref Range   Iron 124 45 - 182 ug/dL   TIBC 266 250 - 450 ug/dL   Saturation Ratios 47 (H) 17.9 - 39.5 %   UIBC 142 ug/dL    Comment: Performed at [facility], [address] [place], [place] [address]  Ferritin     Status: Abnormal   Collection Time: 10/29/20 11:07 PM  Result Value Ref Range   Ferritin 627 (H) 24 - 336 ng/mL    Comment: Performed at [facility], [address] [place], [place] [address]  Reticulocytes     Status: Abnormal   Collection Time: 10/29/20 11:07 PM  Result Value Ref Range   Retic Ct Pct 1.3 0.4 - 3.1 %   RBC. 1.02 (L) 4.22 - 5.81 MIL/uL   Retic Count, Absolute 12.8 (L) 19.0 - 186.0 K/uL   Immature Retic Fract 16.5 (H) 2.3 - 15.9 %     Comment: Performed at [facility], [address] [place], [place] [address]  Urinalysis, Routine w reflex microscopic Urine, Clean Catch     Status: Abnormal   Collection Time: 10/29/20 11:56 PM  Result Value Ref Range   Color, Urine YELLOW YELLOW   APPearance HAZY (A) CLEAR   Specific Gravity, Urine 1.014 1.005 - 1.030   pH 7.0 5.0 - 8.0   Glucose, UA NEGATIVE NEGATIVE mg/dL   Hgb urine dipstick NEGATIVE NEGATIVE   Bilirubin Urine NEGATIVE NEGATIVE   Ketones, ur NEGATIVE NEGATIVE mg/dL   Protein, ur 100 (A) NEGATIVE mg/dL   Nitrite NEGATIVE NEGATIVE   Leukocytes,Ua NEGATIVE NEGATIVE   RBC / HPF 0-5 0 - 5 RBC/hpf   WBC, UA 0-5 0 - 5 WBC/hpf   Bacteria, UA NONE SEEN NONE SEEN   Ca Oxalate Crys, UA PRESENT     Comment: Performed at [facility], [address], [place],  [address]  Rapid urine drug screen (hospital performed)     Status: None   Collection Time: 10/29/20 11:56 PM  Result Value Ref Range   Opiates NONE DETECTED NONE DETECTED  Cocaine NONE DETECTED NONE DETECTED   Benzodiazepines NONE DETECTED NONE DETECTED   Amphetamines NONE DETECTED NONE DETECTED   Tetrahydrocannabinol NONE DETECTED NONE DETECTED   Barbiturates NONE DETECTED NONE DETECTED    Comment: (NOTE) DRUG SCREEN FOR MEDICAL PURPOSES ONLY.  IF CONFIRMATION IS NEEDED FOR ANY PURPOSE, NOTIFY LAB WITHIN 5 DAYS.  LOWEST DETECTABLE LIMITS FOR URINE DRUG SCREEN Drug Class                     Cutoff (ng/mL) Amphetamine and metabolites    1000 Barbiturate and metabolites    200 Benzodiazepine                 374 Tricyclics and metabolites     300 Opiates and metabolites        300 Cocaine and metabolites        300 THC                            50 Performed at [facility], [address] [place], [place] [address]   Prepare RBC (crossmatch)     Status: None   Collection Time: 10/30/20 12:05 AM  Result Value Ref Range   Order Confirmation      ORDER PROCESSED BY BLOOD BANK Performed at [facility], [address], [place], [place] [address]   ABO/Rh     Status: None   Collection Time: 10/30/20 12:05 AM  Result Value Ref Range   ABO/RH(D)      B POS Performed at [facility], [address], [place], [place] [address]   .Cooxemetry Panel (carboxy, met, total hgb, O2 sat)     Status: Abnormal   Collection Time: 10/30/20 12:14 AM  Result Value Ref Range   Total hemoglobin 3.5 (LL) 12.0 - 16.0 g/dL    Comment: CRITICAL RESULT CALLED TO, READ BACK BY AND VERIFIED WITH: HARRIS,B 2215 10/30/2020 COLEMAN,R    O2 Saturation 96.4 %   Carboxyhemoglobin 2.5 (H) 0.5 - 1.5 %   Methemoglobin 0.9 0.0 - 1.5 %    Comment: Performed at [facility], [address] [place], [place] [address]  MRSA PCR Screening     Status: None   Collection Time: 10/30/20  1:25 AM   Specimen: Nasal Mucosa; Nasopharyngeal  Result Value Ref Range   MRSA by PCR NEGATIVE NEGATIVE    Comment:        The GeneXpert MRSA Assay (FDA approved for NASAL specimens only), is one component of a comprehensive MRSA colonization surveillance program. It is not intended to diagnose MRSA infection nor to guide or monitor treatment for MRSA infections. Performed at [facility], [address] [place], [place] [address]   Troponin I (High Sensitivity)     Status: Abnormal   Collection Time: 10/30/20  2:04 AM  Result Value Ref Range   Troponin I (High Sensitivity) 78 (H) <18 ng/L    Comment: DELTA CHECK NOTED RESULT CALLED TO, READ BACK BY AND VERIFIED WITH: KINDLEY,C @ 0259 ON 10/30/20 BY JUW (NOTE) Elevated high sensitivity troponin I (hsTnI) values and significant  changes across serial measurements may suggest ACS but many other  chronic and acute conditions are known to elevate hsTnI results.  Refer to the Links section for chest pain algorithms and additional  guidance. Performed at [facility], [address], [place], [place] [address]   HIV Antibody (routine testing w rflx)     Status:  None    Collection Time: 10/30/20  2:04 AM  Result Value Ref Range   HIV Screen 4th Generation wRfx Non Reactive Non Reactive    Comment: Performed at [facility], [place] [address] [place], [place] [address]  CBC with Differential/Platelet     Status: Abnormal   Collection Time: 10/30/20  9:31 AM  Result Value Ref Range   WBC 5.1 4.0 - 10.5 K/uL   RBC 1.61 (L) 4.22 - 5.81 MIL/uL   Hemoglobin 5.5 (LL) 13.0 - 17.0 g/dL    Comment: REPEATED TO VERIFY THIS CRITICAL RESULT HAS VERIFIED AND BEEN CALLED TO FOLEY BY LATISHA HENDERSON ON 04 17 2022 AT 1001, AND HAS BEEN READ BACK.     HCT 16.8 (L) 39.0 - 52.0 %   MCV 104.3 (H) 80.0 - 100.0 fL    Comment: POST TRANSFUSION SPECIMEN REPEATED TO VERIFY DELTA CHECK NOTED    MCH 34.2 (H) 26.0 - 34.0 pg   MCHC 32.7 30.0 - 36.0 g/dL   RDW Not Measured 11.5 - 15.5 %   Platelets 97 (L) 150 - 400 K/uL    Comment: Immature Platelet Fraction may be clinically indicated, consider ordering this additional test [id number]    nRBC 0.0 0.0 - 0.2 %   Neutrophils Relative % 71 %   Neutro Abs 3.6 1.7 - 7.7 K/uL   Lymphocytes Relative 13 %   Lymphs Abs 0.7 0.7 - 4.0 K/uL   Monocytes Relative 15 %   Monocytes Absolute 0.8 0.1 - 1.0 K/uL   Eosinophils Relative 0 %   Eosinophils Absolute 0.0 0.0 - 0.5 K/uL   Basophils Relative 0 %   Basophils Absolute 0.0 0.0 - 0.1 K/uL   Immature Granulocytes 1 %   Abs Immature Granulocytes 0.06 0.00 - 0.07 K/uL    Comment: Performed at [facility], [address], [place], [place] [address]  Comprehensive metabolic panel     Status: Abnormal   Collection Time: 10/30/20  9:31 AM  Result Value Ref Range   Sodium 130 (L) 135 - 145 mmol/L   Potassium 4.3 3.5 - 5.1 mmol/L   Chloride 100 98 - 111 mmol/L   CO2 25 22 - 32 mmol/L   Glucose, Bld 135 (H) 70 - 99 mg/dL    Comment: Glucose reference range applies only to samples taken after fasting for at least 8 hours.   BUN 45 (H) 8 - 23 mg/dL   Creatinine, Ser 3.96 (H) 0.61 -  1.24 mg/dL   Calcium 12.8 (H) 8.9 - 10.3 mg/dL   Total Protein 11.2 (H) 6.5 - 8.1 g/dL   Albumin 2.2 (L) 3.5 - 5.0 g/dL   AST 39 15 - 41 U/L   ALT 18 0 - 44 U/L   Alkaline Phosphatase 59 38 - 126 U/L   Total Bilirubin 0.6 0.3 - 1.2 mg/dL   GFR, Estimated 16 (L) >60 mL/min    Comment: (NOTE) Calculated using the CKD-EPI Creatinine Equation (2021)    Anion gap 5 5 - 15    Comment: Performed at [facility], [address], [place], [place] [address]  Magnesium     Status: None   Collection Time: 10/30/20  9:31 AM  Result Value Ref Range   Magnesium 1.7 1.7 - 2.4 mg/dL    Comment: Performed at [facility], [address] [place], [place] [address]  Reticulocytes     Status: Abnormal   Collection Time: 10/30/20  9:31 AM  Result Value Ref  Range   Retic Ct Pct 1.2 0.4 - 3.1 %   RBC. 1.61 (L) 4.22 - 5.81 MIL/uL   Retic Count, Absolute 19.8 19.0 - 186.0 K/uL   Immature Retic Fract 13.0 2.3 - 15.9 %    Comment: Performed at [facility], [address], [place], [place] [address]  Folate     Status: None   Collection Time: 10/30/20  9:31 AM  Result Value Ref Range   Folate 8.8 >5.9 ng/mL    Comment: Performed at [facility], [address], [place], [place] [address]  Troponin I (High Sensitivity)     Status: Abnormal   Collection Time: 10/30/20  9:31 AM  Result Value Ref Range   Troponin I (High Sensitivity) 2,985 (HH) <18 ng/L    Comment: CRITICAL RESULT CALLED TO, READ BACK BY AND VERIFIED WITH: A SHELTON 4/17 @ 1031 BY S BEARD (NOTE) Elevated high sensitivity troponin I (hsTnI) values and significant  changes across serial measurements may suggest ACS but many other  chronic and acute conditions are known to elevate hsTnI results.  Refer to the Links section for chest pain algorithms and additional  guidance. Performed at [facility], [address], [place], [place] [address]   Vitamin B12     Status: Abnormal   Collection Time: 10/30/20  9:31 AM  Result Value Ref Range    Vitamin B-12 132 (L) 180 - 914 pg/mL    Comment: (NOTE) This assay is not validated for testing neonatal or myeloproliferative syndrome specimens for Vitamin B12 levels. Performed at [facility], [address] [place], [place] [address]   Iron and TIBC     Status: Abnormal   Collection Time: 10/30/20  9:31 AM  Result Value Ref Range   Iron 101 45 - 182 ug/dL   TIBC 242 (L) 250 - 450 ug/dL   Saturation Ratios 42 (H) 17.9 - 39.5 %   UIBC 141 ug/dL    Comment: Performed at [facility], [address], [place], [place] [address]  Ferritin     Status: Abnormal   Collection Time: 10/30/20  9:31 AM  Result Value Ref Range   Ferritin 1,406 (H) 24 - 336 ng/mL    Comment: Performed at [facility], [address], [place], [place] [address]  Lactate dehydrogenase     Status: Abnormal   Collection Time: 10/30/20  9:31 AM  Result Value Ref Range   LDH 86 (L) 98 - 192 U/L    Comment: Performed at [facility], [address] [place], [place] [address]  VITAMIN D 25 Hydroxy (Vit-D Deficiency, Fractures)     Status: None   Collection Time: 10/30/20  9:31 AM  Result Value Ref Range   Vit D, 25-Hydroxy 80.38 30 - 100 ng/mL    Comment: (NOTE) Vitamin D deficiency has been defined by the [place] practice guideline as a level of serum 25-OH  vitamin D less than 20 ng/mL (1,2). The Endocrine Society went on to  further define vitamin D insufficiency as a level between 21 and 29  ng/mL (2).  1. IOM (Institute of Medicine). 2010. Dietary reference intakes for  calcium and D. [place]: The [employer]. 2. Holick MF, Binkley Engelhard, Bischoff-Ferrari HA, et al. Evaluation,  treatment, and prevention of vitamin D deficiency: an Endocrine  Society clinical practice guideline, JCEM. 2011 Jul; 96(7): 1911-30.  Performed at [facility], [place] [address], [place], [place] [address]  Calcium, ionized     Status: Abnormal   Collection Time:  10/30/20  9:31 AM  Result Value Ref Range   Calcium, Ionized, Serum 7.8 (H) 4.5 - 5.6 mg/dL    Comment: (NOTE) **Results verified by repeat testing** Performed At: [facility] [place], [place] [id number] Rush Farmer MD [id number]   Hemoglobin A1c     Status: None   Collection Time: 10/30/20  9:31 AM  Result Value Ref Range   Hgb A1c MFr Bld 5.6 4.8 - 5.6 %    Comment: (NOTE)         Prediabetes: 5.7 - 6.4         Diabetes: >6.4         Glycemic control for adults with diabetes: <7.0    Mean Plasma Glucose 114 mg/dL    Comment: (NOTE) Performed At: [facility] [place], [place] [id number] Rush Farmer MD [id number]   T4, free     Status: None   Collection Time: 10/30/20  5:41 PM  Result Value Ref Range   Free T4 0.87 0.61 - 1.12 ng/dL    Comment: (NOTE) Biotin ingestion may interfere with free T4 tests. If the results are inconsistent with the TSH level, previous test results, or the clinical presentation, then consider biotin interference. If needed, order repeat testing after stopping biotin. Performed at [facility], [place] [address], [place], [place] [address]   Troponin I (High Sensitivity)     Status: Abnormal   Collection Time: 10/30/20  9:12 PM  Result Value Ref Range   Troponin I (High Sensitivity) 3,519 (HH) <18 ng/L    Comment: CRITICAL RESULT CALLED TO, READ BACK BY AND VERIFIED WITH: KINDLEY,C 2220 10/30/2020 COLEMAN,R (NOTE) Elevated high sensitivity troponin I (hsTnI) values and significant  changes across serial measurements may suggest ACS but many other  chronic and acute conditions are known to elevate hsTnI results.  Refer to the Links section for chest pain algorithms and additional  guidance. Performed at [facility], [address] [place], [place] [address]   Troponin I (High Sensitivity)     Status: Abnormal   Collection Time: 10/31/20  2:29 AM  Result Value Ref Range    Troponin I (High Sensitivity) 3,537 (HH) <18 ng/L    Comment: CRITICAL RESULT CALLED TO, READ BACK BY AND VERIFIED WITH: KINDLY,C AT 4:55AM ON 10/31/20 BY FESTERMAN,C (NOTE) Elevated high sensitivity troponin I (hsTnI) values and significant  changes across serial measurements may suggest ACS but many other  chronic and acute conditions are known to elevate hsTnI results.  Refer to the Links section for chest pain algorithms and additional  guidance. Performed at [facility], [address], [place], [place] [address]   Troponin I (High Sensitivity)     Status: Abnormal   Collection Time: 10/31/20  4:30 AM  Result Value Ref Range   Troponin I (High Sensitivity) 3,946 (HH) <18 ng/L    Comment: CRITICAL RESULT CALLED TO, READ BACK BY AND VERIFIED WITH: HEARN,J AT 6:00AM ON 10/31/20 BY FESTERMAN,C (NOTE) Elevated high sensitivity troponin I (hsTnI) values and significant  changes across serial measurements may suggest ACS but many other  chronic and acute conditions are known to elevate hsTnI results.  Refer to the Links section for chest pain algorithms and additional  guidance. Performed at [facility], [address], [place], [place] [address]   CBC     Status: Abnormal   Collection Time: 10/31/20  4:32 AM  Result Value Ref Range   WBC 7.2 4.0 - 10.5 K/uL   RBC 2.34 (L) 4.22 - 5.81 MIL/uL   Hemoglobin 7.7 (L) 13.0 - 17.0 g/dL    Comment: REPEATED TO VERIFY POST TRANSFUSION SPECIMEN    HCT 22.9 (L) 39.0 - 52.0 %   MCV 97.9 80.0 - 100.0 fL   MCH 32.9 26.0 - 34.0 pg   MCHC 33.6 30.0 - 36.0 g/dL   RDW 23.1 (H) 11.5 - 15.5 %   Platelets 93 (L) 150 - 400 K/uL    Comment: SPECIMEN CHECKED FOR CLOTS Immature Platelet Fraction may be clinically indicated, consider ordering this additional test [id number] CONSISTENT WITH PREVIOUS RESULT    nRBC 0.0 0.0 - 0.2 %    Comment: Performed at [facility], [address] [place], [place] [address]  Comprehensive metabolic panel      Status: Abnormal   Collection Time: 10/31/20  4:32 AM  Result Value Ref Range   Sodium 131 (L) 135 - 145 mmol/L   Potassium 3.8 3.5 - 5.1 mmol/L   Chloride 103 98 - 111 mmol/L   CO2 24 22 - 32 mmol/L   Glucose, Bld 92 70 - 99 mg/dL    Comment: Glucose reference range applies only to samples taken after fasting for at least 8 hours.   BUN 48 (H) 8 - 23 mg/dL   Creatinine, Ser 4.05 (H) 0.61 - 1.24 mg/dL   Calcium 11.5 (H) 8.9 - 10.3 mg/dL   Total Protein 10.3 (H) 6.5 - 8.1 g/dL   Albumin 2.0 (L) 3.5 - 5.0 g/dL   AST 41 15 - 41 U/L   ALT 16 0 - 44 U/L   Alkaline Phosphatase 51 38 - 126 U/L   Total Bilirubin 0.7 0.3 - 1.2 mg/dL   GFR, Estimated 16 (L) >60 mL/min    Comment: (NOTE) Calculated using the CKD-EPI Creatinine Equation (2021)    Anion gap 4 (L) 5 - 15    Comment: Performed at [facility], [address], [place], [place] [address]  Magnesium     Status: Abnormal   Collection Time: 10/31/20  4:32 AM  Result Value Ref Range   Magnesium 1.5 (L) 1.7 - 2.4 mg/dL    Comment: Performed at [facility], [address], [place], [place] [address]  Brain natriuretic peptide     Status: Abnormal   Collection Time: 10/31/20  4:32 AM  Result Value Ref Range   B Natriuretic Peptide 883.0 (H) 0.0 - 100.0 pg/mL    Comment: Performed at [facility], [address], [place],  [address]  Troponin I (High Sensitivity)     Status: Abnormal   Collection Time: 10/31/20  8:02 AM  Result Value Ref Range   Troponin I (High Sensitivity) 3,336 (HH) <18 ng/L    Comment: CRITICAL VALUE NOTED.  VALUE IS CONSISTENT WITH PREVIOUSLY REPORTED AND CALLED VALUE. (NOTE) Elevated high sensitivity troponin I (hsTnI) values and significant  changes across serial measurements may suggest ACS but many other  chronic and acute conditions are known to elevate hsTnI results.  Refer to the Links section for chest pain algorithms and additional  guidance. Performed at [facility], [address],  [place],  [address]   Lactate dehydrogenase     Status: None   Collection Time: 10/31/20  1:41 PM  Result Value Ref Range   LDH 115 98 - 192 U/L    Comment: Performed at [facility], [place]  [address], [place], [place] [address]  PSA     Status: None   Collection Time: 10/31/20  1:41 PM  Result Value Ref Range   Prostatic Specific Antigen 3.36 0.00 - 4.00 ng/mL    Comment: (NOTE) While PSA levels of <=4.0 ng/ml are reported as reference range, some men with levels below 4.0 ng/ml can have prostate cancer and many men with PSA above 4.0 ng/ml do not have prostate cancer.  Other tests such as free PSA, age specific reference ranges, PSA velocity and PSA doubling time may be helpful especially in men less than 53 years old. Performed at [facility], [place] [address] [place], [place] [address]   TSH     Status: None   Collection Time: 10/31/20  1:41 PM  Result Value Ref Range   TSH 4.033 0.350 - 4.500 uIU/mL    Comment: Performed by a 3rd Generation assay with a functional sensitivity of <=0.01 uIU/mL. Performed at [facility], [address], [place], [place] [address]       RADIOGRAPHY: CT ABDOMEN PELVIS WO CONTRAST  Result Date: 10/30/2020 CLINICAL DATA:  Cancer of unknown primary, multiple lytic lesions in the calvaria noted incidentally. Dizziness, unintended weight loss. EXAM: CT CHEST, ABDOMEN AND PELVIS WITHOUT CONTRAST TECHNIQUE: Multidetector CT imaging of the chest, abdomen and pelvis was performed following the standard protocol without IV contrast. COMPARISON:  CT head 10/29/2020, chest radiograph 10/29/2020 FINDINGS: CT CHEST FINDINGS Cardiovascular: Cardiomegaly. Coronary artery calcifications. Hypoattenuation of the cardiac blood pool compatible with anemia. The aortic root is suboptimally assessed given cardiac pulsation artifact. Atherosclerotic plaque within the normal caliber aorta. Shared origin of the brachiocephalic and left common carotid arteries. Proximal  great vessels are otherwise unremarkable. Central pulmonary arteries are top-normal caliber. No large central filling defects within limitations of a nonenhanced exam. Mediastinum/Nodes: No mediastinal fluid or gas. Normal thyroid gland and thoracic inlet. No acute abnormality of the trachea or esophagus. No worrisome mediastinal or axillary adenopathy. Hilar nodal evaluation is limited in the absence of intravenous contrast media. Lungs/Pleura: No consolidation, features of edema, pneumothorax, or effusion. No suspicious pulmonary nodules or masses. Some dependent atelectasis. Evaluation slightly limited by mild respiratory motion artifact. Musculoskeletal: Widespread extensive lytic mottling throughout the axial and appendicular skeleton. Multiple rib fractures are seen in various stages of healing many of which are presumed to be pathologic in nature. Severe degenerative changes are present in the bilateral shoulders including larger heterotopic ossifications seen in the left shoulder recess. Bridging syndesmophytes across much of the thoracic levels and partial bridging across the spinous processes, could reflect some underlying spondyloarthropathy. CT ABDOMEN PELVIS FINDINGS Hepatobiliary: No visible focal liver lesion with limitations of an unenhanced CT. Smooth surface contour. Normal hepatic attenuation. Normal gallbladder and biliary tree without visible calcified gallstone. Pancreas: Mild pancreatic atrophy. No pancreatic ductal dilatation or surrounding inflammatory changes. Spleen: Splenomegaly within enlarged, lobular spleen. No visible focal splenic lesion within the limitations of this unenhanced exam. Adrenals/Urinary Tract: No adrenal mass or hemorrhage. Slight under rotation of the bilateral kidneys, anatomic variant. No visible or contour deforming renal lesion. No urolithiasis or hydronephrosis. Urinary bladder is largely decompressed at the time of exam and therefore poorly evaluated by CT  imaging. Mild circumferential bladder wall thickening may be related underdistention or chronic outlet obstruction given marked indentation of bladder base by an enlarged prostate. Stomach/Bowel: Distal esophagus, stomach and duodenum are unremarkable. No small bowel thickening or dilatation. Normal appendix in the right lower quadrant. Extensive distal colonic  diverticulosis without active acute inflammation to suggest active diverticulitis. Some mild mural thickening in the sigmoid may be reflective of prior inflammation though warrants further evaluation with outpatient colonoscopy if not recently performed. Vascular/Lymphatic: Atherosclerotic calcifications within the abdominal aorta and branch vessels. No aneurysm or ectasia. No enlarged abdominopelvic lymph nodes. Reproductive: Enlarged prostate with few typically benign punctate calcifications. Trace right hydrocele. Other: No abdominopelvic free air or fluid. No bowel containing hernia. Musculoskeletal: Extensive lytic foci and bony remodeling throughout the axial and appendicular skeleton of the abdomen and pelvis including the proximal femora as well. Bony ankylosis of the L4-L5 and lumbosacral junction as well as across the bilateral SI joints. IMPRESSION: 1. Extensive lytic foci and bony mottling throughout the axial and appendicular skeleton of the chest, abdomen and pelvis including the proximal femora and humeri as well as multiple bilateral rib fractures in various stages of healing many of which are presumed to be pathologic in nature. Findings are concerning for a diffuse osseous metastatic disease versus multiple myeloma. 2. Mild mural thickening in the sigmoid may be reflective of prior inflammation given the numerous colonic diverticula within the segment though warrants further evaluation with outpatient colonoscopy if not recently performed. 3. Splenomegaly within enlarged, lobular spleen, nonspecific but can be seen in the setting of  myeloproliferative disorder. 4. Cardiomegaly and coronary artery calcifications. Hypoattenuation of the cardiac blood pool suggestive of anemia. 5. Trace right hydrocele. 6. Mild circumferential bladder wall thickening may be related underdistention or chronic outlet obstruction given marked indentation of bladder base by an enlarged prostate. Could correlate with urinalysis to exclude cystitis. 7. Prostatomegaly. 8. Aortic Atherosclerosis (ICD10-I70.0). Electronically Signed   By: Lovena Le M.D.   On: 10/30/2020 01:15   CT Head Wo Contrast  Result Date: 10/29/2020 CLINICAL DATA:  Dizziness, carbon monoxide exposure 2 months prior with COVID few weeks ago. Unintended weight loss. EXAM: CT HEAD WITHOUT CONTRAST TECHNIQUE: Contiguous axial images were obtained from the base of the skull through the vertex without intravenous contrast. COMPARISON:  None. FINDINGS: Brain: No evidence of acute infarction, hemorrhage, hydrocephalus, extra-axial collection, visible mass lesion or mass effect. Symmetric prominence of the ventricles, cisterns and sulci compatible with parenchymal volume loss. Patchy areas of white matter hypoattenuation are most compatible with chronic microvascular angiopathy. Vascular: Atherosclerotic calcification of the carotid siphons. No hyperdense vessel. Skull: Mottled appearance of the bone particularly towards the clivus and sphenoid bones with multiple sites of lytic lesions, largest in the posterior left parietal bone measuring 19 x 8 mm (3/42) a slightly more expansile lytic focus is also seen in the right temporal bone measuring 15 x 7 mm (3/19) and in the right mastoid measuring 11 x 12 mm (3/9). Additional scattered foci elsewhere. No calvarial fracture. No scalp swelling or hematoma. Sinuses/Orbits: Nodular mural thickening in the paranasal sinuses, predominantly in the maxillary sinuses. Mastoid air cells are predominantly clear. Middle ear cavities are clear. Other: None IMPRESSION:  1. Mottled appearance of the calvaria and skull base with multitude of lytic lesions concerning for metastatic disease or myeloma. 2. No acute intracranial abnormality is seen within the limitations of an unenhanced CT. Background of chronic microvascular angiopathy and parenchymal volume loss. These results were called by telephone at the time of interpretation on 10/29/2020 at 10:54 pm to provider [facility] ZAMMIT , who verbally acknowledged these results. Electronically Signed   By: Lovena Le M.D.   On: 10/29/2020 22:56   CT Chest Wo Contrast  Result Date: 10/30/2020 CLINICAL DATA:  Cancer of unknown primary, multiple lytic lesions in the calvaria noted incidentally. Dizziness, unintended weight loss. EXAM: CT CHEST, ABDOMEN AND PELVIS WITHOUT CONTRAST TECHNIQUE: Multidetector CT imaging of the chest, abdomen and pelvis was performed following the standard protocol without IV contrast. COMPARISON:  CT head 10/29/2020, chest radiograph 10/29/2020 FINDINGS: CT CHEST FINDINGS Cardiovascular: Cardiomegaly. Coronary artery calcifications. Hypoattenuation of the cardiac blood pool compatible with anemia. The aortic root is suboptimally assessed given cardiac pulsation artifact. Atherosclerotic plaque within the normal caliber aorta. Shared origin of the brachiocephalic and left common carotid arteries. Proximal great vessels are otherwise unremarkable. Central pulmonary arteries are top-normal caliber. No large central filling defects within limitations of a nonenhanced exam. Mediastinum/Nodes: No mediastinal fluid or gas. Normal thyroid gland and thoracic inlet. No acute abnormality of the trachea or esophagus. No worrisome mediastinal or axillary adenopathy. Hilar nodal evaluation is limited in the absence of intravenous contrast media. Lungs/Pleura: No consolidation, features of edema, pneumothorax, or effusion. No suspicious pulmonary nodules or masses. Some dependent atelectasis. Evaluation slightly limited by  mild respiratory motion artifact. Musculoskeletal: Widespread extensive lytic mottling throughout the axial and appendicular skeleton. Multiple rib fractures are seen in various stages of healing many of which are presumed to be pathologic in nature. Severe degenerative changes are present in the bilateral shoulders including larger heterotopic ossifications seen in the left shoulder recess. Bridging syndesmophytes across much of the thoracic levels and partial bridging across the spinous processes, could reflect some underlying spondyloarthropathy. CT ABDOMEN PELVIS FINDINGS Hepatobiliary: No visible focal liver lesion with limitations of an unenhanced CT. Smooth surface contour. Normal hepatic attenuation. Normal gallbladder and biliary tree without visible calcified gallstone. Pancreas: Mild pancreatic atrophy. No pancreatic ductal dilatation or surrounding inflammatory changes. Spleen: Splenomegaly within enlarged, lobular spleen. No visible focal splenic lesion within the limitations of this unenhanced exam. Adrenals/Urinary Tract: No adrenal mass or hemorrhage. Slight under rotation of the bilateral kidneys, anatomic variant. No visible or contour deforming renal lesion. No urolithiasis or hydronephrosis. Urinary bladder is largely decompressed at the time of exam and therefore poorly evaluated by CT imaging. Mild circumferential bladder wall thickening may be related underdistention or chronic outlet obstruction given marked indentation of bladder base by an enlarged prostate. Stomach/Bowel: Distal esophagus, stomach and duodenum are unremarkable. No small bowel thickening or dilatation. Normal appendix in the right lower quadrant. Extensive distal colonic diverticulosis without active acute inflammation to suggest active diverticulitis. Some mild mural thickening in the sigmoid may be reflective of prior inflammation though warrants further evaluation with outpatient colonoscopy if not recently performed.  Vascular/Lymphatic: Atherosclerotic calcifications within the abdominal aorta and branch vessels. No aneurysm or ectasia. No enlarged abdominopelvic lymph nodes. Reproductive: Enlarged prostate with few typically benign punctate calcifications. Trace right hydrocele. Other: No abdominopelvic free air or fluid. No bowel containing hernia. Musculoskeletal: Extensive lytic foci and bony remodeling throughout the axial and appendicular skeleton of the abdomen and pelvis including the proximal femora as well. Bony ankylosis of the L4-L5 and lumbosacral junction as well as across the bilateral SI joints. IMPRESSION: 1. Extensive lytic foci and bony mottling throughout the axial and appendicular skeleton of the chest, abdomen and pelvis including the proximal femora and humeri as well as multiple bilateral rib fractures in various stages of healing many of which are presumed to be pathologic in nature. Findings are concerning for a diffuse osseous metastatic disease versus multiple myeloma. 2. Mild mural thickening in the sigmoid may be reflective of prior inflammation given the numerous colonic diverticula  within the segment though warrants further evaluation with outpatient colonoscopy if not recently performed. 3. Splenomegaly within enlarged, lobular spleen, nonspecific but can be seen in the setting of myeloproliferative disorder. 4. Cardiomegaly and coronary artery calcifications. Hypoattenuation of the cardiac blood pool suggestive of anemia. 5. Trace right hydrocele. 6. Mild circumferential bladder wall thickening may be related underdistention or chronic outlet obstruction given marked indentation of bladder base by an enlarged prostate. Could correlate with urinalysis to exclude cystitis. 7. Prostatomegaly. 8. Aortic Atherosclerosis (ICD10-I70.0). Electronically Signed   By: Lovena Le M.D.   On: 10/30/2020 01:15   DG Chest Port 1 View  Result Date: 10/31/2020 CLINICAL DATA:  Altered mental status EXAM:  PORTABLE CHEST 1 VIEW COMPARISON:  10/29/2020 FINDINGS: New elevation of the right hemidiaphragm with right basilar pleuroparenchymal process. Chronic interstitial changes. No pneumothorax. Similar cardiomediastinal contours. IMPRESSION: New elevation of the right hemidiaphragm with right basilar atelectasis/consolidation and possible pleural effusion. Electronically Signed   By: Macy Mis M.D.   On: 10/31/2020 11:44   DG Chest Port 1 View  Result Date: 10/29/2020 CLINICAL DATA:  Weakness EXAM: PORTABLE CHEST 1 VIEW COMPARISON:  None. FINDINGS: Cardiac shadow is enlarged. Lungs are well aerated bilaterally. Mild vascular congestion is noted without interstitial edema. No focal infiltrate or sizable effusion is seen. Degenerative changes of the shoulder joints and thoracic spine are noted. IMPRESSION: Mild vascular congestion without acute infiltrate or edema. Electronically Signed   By: Inez Catalina M.D.   On: 10/29/2020 23:07   ECHOCARDIOGRAM COMPLETE  Result Date: 10/30/2020    ECHOCARDIOGRAM REPORT   Patient Name:   DIVONTE SENGER Date of Exam: 10/30/2020 Medical Rec #:  [id number]             Height:       69.0 in Accession #:    [id number]            Weight:       151.9 lb Date of Birth:  November 08, 1955              BSA:          1.838 m Patient Age:    37 years              BP:           129/58 mmHg Patient Gender: M                     HR:           83 bpm. Exam Location:  [facility] Procedure: 2D Echo, Cardiac Doppler and Color Doppler Indications:    Elevated Troponin  History:        Patient has no prior history of Echocardiogram examinations.                 Risk Factors:Hypertension and Dyslipidemia. COVID-19 virus                 infection, Hypercalcemia, Moderate protein-calorie malnutrition,                 AKI (acute kidney injury) ([place]).  Sonographer:    Alvino Chapel RCS Referring Phys: [place]  1. Left ventricular ejection fraction, by estimation, is 60 to  65%. The left ventricle has normal function. The left ventricle has no regional wall motion abnormalities. Left ventricular diastolic parameters are indeterminate. Elevated left atrial pressure.  2. Right ventricular systolic function is mildly reduced. The right  ventricular size is moderately enlarged. There is moderately elevated pulmonary artery systolic pressure.  3. Left atrial size was severely dilated.  4. Right atrial size was severely dilated.  5. The mitral valve is normal in structure. Mild mitral valve regurgitation. No evidence of mitral stenosis.  6. The aortic valve is tricuspid. Aortic valve regurgitation is not visualized. No aortic stenosis is present.  7. The inferior vena cava is dilated in size with <50% respiratory variability, suggesting right atrial pressure of 15 mmHg. FINDINGS  Left Ventricle: Left ventricular ejection fraction, by estimation, is 60 to 65%. The left ventricle has normal function. The left ventricle has no regional wall motion abnormalities. The left ventricular internal cavity size was normal in size. There is  no left ventricular hypertrophy. Left ventricular diastolic parameters are indeterminate. Elevated left atrial pressure. Right Ventricle: The right ventricular size is moderately enlarged. Right vetricular wall thickness was not assessed. Right ventricular systolic function is mildly reduced. There is moderately elevated pulmonary artery systolic pressure. The tricuspid regurgitant velocity is 2.99 m/s, and with an assumed right atrial pressure of 15 mmHg, the estimated right ventricular systolic pressure is 23.7 mmHg. Left Atrium: Left atrial size was severely dilated. Right Atrium: Right atrial size was severely dilated. Pericardium: There is no evidence of pericardial effusion. Mitral Valve: The mitral valve is normal in structure. Mild mitral valve regurgitation. No evidence of mitral valve stenosis. Tricuspid Valve: The tricuspid valve is normal in structure.  Tricuspid valve regurgitation is mild . No evidence of tricuspid stenosis. Aortic Valve: The aortic valve is tricuspid. Aortic valve regurgitation is not visualized. No aortic stenosis is present. Aortic valve mean gradient measures 5.8 mmHg. Aortic valve peak gradient measures 12.0 mmHg. Aortic valve area, by VTI measures 3.01  cm. Pulmonic Valve: The pulmonic valve was not well visualized. Pulmonic valve regurgitation is not visualized. No evidence of pulmonic stenosis. Aorta: The aortic root is normal in size and structure. Pulmonary Artery: Moderate pulmonary HTN, PASP is 51 mmHg. Venous: The inferior vena cava is dilated in size with less than 50% respiratory variability, suggesting right atrial pressure of 15 mmHg. IAS/Shunts: No atrial level shunt detected by color flow Doppler.  LEFT VENTRICLE PLAX 2D LVIDd:         5.20 cm  Diastology LVIDs:         3.50 cm  LV e' medial:    6.20 cm/s LV PW:         0.90 cm  LV E/e' medial:  23.2 LV IVS:        1.10 cm  LV e' lateral:   13.80 cm/s LVOT diam:     2.20 cm  LV E/e' lateral: 10.4 LV SV:         98 LV SV Index:   53 LVOT Area:     3.80 cm  RIGHT VENTRICLE RV S prime:     15.90 cm/s TAPSE (M-mode): 2.3 cm LEFT ATRIUM              Index       RIGHT ATRIUM           Index LA diam:        4.90 cm  2.67 cm/m  RA Area:     27.80 cm LA Vol (A2C):   141.0 ml 76.71 ml/m RA Volume:   96.80 ml  52.67 ml/m LA Vol (A4C):   128.0 ml 69.64 ml/m LA Biplane Vol: 138.0 ml 75.08 ml/m  AORTIC VALVE AV  Area (Vmax):    2.79 cm AV Area (Vmean):   2.74 cm AV Area (VTI):     3.01 cm AV Vmax:           172.87 cm/s AV Vmean:          112.345 cm/s AV VTI:            0.324 m AV Peak Grad:      12.0 mmHg AV Mean Grad:      5.8 mmHg LVOT Vmax:         127.00 cm/s LVOT Vmean:        80.900 cm/s LVOT VTI:          0.257 m LVOT/AV VTI ratio: 0.79  AORTA Ao Root diam: 3.00 cm MITRAL VALVE                 TRICUSPID VALVE MV Area (PHT): 4.54 cm      TR Peak grad:   35.8 mmHg MV Decel  Time: 167 msec      TR Vmax:        299.00 cm/s MR Peak grad:    94.9 mmHg MR Mean grad:    62.0 mmHg   SHUNTS MR Vmax:         487.00 cm/s Systemic VTI:  0.26 m MR Vmean:        371.0 cm/s  Systemic Diam: 2.20 cm MR PISA:         2.26 cm MR PISA Eff ROA: 15 mm MR PISA Radius:  0.60 cm MV E velocity: 144.00 cm/s MV A velocity: 45.90 cm/s MV E/A ratio:  3.14 Carlyle Dolly MD Electronically signed by Carlyle Dolly MD Signature Date/Time: 10/30/2020/12:07:16 PM    Final          ASSESSMENT and PLAN:  1.  Hypercalcemia of malignancy: - Presentation with calcium 14.2, albumin 2.5 on 10/29/2020. - Status post pamidronate on 10/29/2020. - Calcium today improved to 11.5.  Continue calcitonin.  2.  Multiple myeloma: - He was found to have M spike of 4 g in September 2021. - CT AP without contrast on 10/30/2020 showed extensive lytic foci and bony remodeling throughout the axial and appendicular skeleton of the abdomen and pelvis including proximal femora. - SPEP from this admission is pending results. - Will order LDH, beta-2 microglobulin, serum immunofixation and free light chains. - 24-hour urine collection for UPEP, light chains, total protein is underway. - Would recommend bone marrow aspiration and biopsy with chromosome analysis and myeloma FISH panel. - He will require treatment as soon as possible to possibly reverse his renal failure. - Once the bone marrow biopsy is done, we will consider cyclophosphamide and dexamethasone (CyBorD regimen). - We have obtained verbal consent from patient's brother for bone marrow biopsy.  3.  Severe anemia: - Presentation with hemoglobin 3.9, MCV 121.  Vitamin B 12 was was low at 103. - Multifactorial anemia from multiple myeloma, renal insufficiency as well as B12 deficiency. - Would recommend B12 supplementation.  4.  Non-ST elevation MI: - He was found to have a elevated troponin levels with no ST elevation on EKG. - He was evaluated by Dr.  Harl Bowie.  Not a candidate for catheterization at this time.  5.  Acute metabolic encephalopathy: - This is likely from hypercalcemia.  Will likely improve once the calcium is corrected.  6.  Acute kidney injury: - Clinical suspicion for myeloma kidney. - Evaluated by Dr. Moshe Cipro.  All questions were answered.  The patient knows to call the clinic with any problems, questions or concerns. We can certainly see the patient much sooner if necessary.   Derek Jack

## 2020-10-31 NOTE — Progress Notes (Signed)
   Pt with possible Myeloma  CLINICAL DATA:  Cancer of unknown primary, multiple lytic lesions in the calvaria noted incidentally. Dizziness, unintended weight loss. CT yesterday:  IMPRESSION: 1. Extensive lytic foci and bony mottling throughout the axial and appendicular skeleton of the chest, abdomen and pelvis including the proximal femora and humeri as well as multiple bilateral rib fractures in various stages of healing many of which are presumed to be pathologic in nature. Findings are concerning for a diffuse osseous metastatic disease versus multiple myeloma. 2. Mild mural thickening in the sigmoid may be reflective of prior inflammation given the numerous colonic diverticula within the segment though warrants further evaluation with outpatient colonoscopy if not recently performed. 3. Splenomegaly within enlarged, lobular spleen, nonspecific but can be seen in the setting of myeloproliferative disorder. 4. Cardiomegaly and coronary artery calcifications. Hypoattenuation of the cardiac blood pool suggestive of anemia. 5. Trace right hydrocele. 6. Mild circumferential bladder wall thickening may be related underdistention or chronic outlet obstruction given marked indentation of bladder base by an enlarged prostate. Could correlate with urinalysis to exclude cystitis.  Request for urgent bone marrow biopsy per Dr Murvin Natal and Dr Delton Coombes  Scheduled for BM bx at [place] 4/19 9am Pt to be at [facility] by 8 am via ambulance Will return to [facility] after procedure  RN aware MD aware Orders in chart

## 2020-10-31 NOTE — Progress Notes (Signed)
Troponin called at 0455 on 10/31/20 of 3537. MD notified. Awaiting response/orders

## 2020-10-31 NOTE — Progress Notes (Signed)
PROGRESS NOTE   Paul Norton  YEM:[id number] DOB: 1956/05/31 DOA: 10/29/2020 PCP: Carlena Hurl, PA-C   Chief Complaint  Patient presents with  . Altered Mental Status   Level of care: ICU  Brief Admission History:  65 y.o. male, with history of HTN, HLD, and history of noncompliance with PCP advised normal presents to the ED with a chief complaint of confusion.  He was admitted with severe hypercalcemia, multiple bony lytic lesions in the calvarium and spinal column, acute renal failure, thrombocytopenia, severe anemia with hemoglobin of 3.9, severe dehydration, abnormal EKG, and severe encephalopathy.  Assessment & Plan:   Principal Problem:   Hypercalcemia Active Problems:   Essential hypertension, benign   Noncompliance   Hyperlipidemia   DOE (dyspnea on exertion)   AKI (acute kidney injury) (HCC)   Severe anemia   Acute metabolic encephalopathy   Hyponatremia   Moderate protein-calorie malnutrition (HCC)   Dehydration   Abnormal CT of the head   Thrombocytopenia (HCC)   Refuses treatment   Hypercalcemia of malignancy -Patient presents with severe calcium elevation -Improving with IV fluids, IV pamidronate given on 4/16, calcitonin - Continue to monitor closely, anticipate calcium will continue to follow over the next 24 hours as the pamidronate starts working  Acute metabolic encephalopathy - secondary to severe hypercalcemia - Mentation should improve as calcium is corrected  Severe anemia/thrombocytopenia - concerning for underlying MDS versus multiple myeloma - Hg up to 7.7 after 4 units PRBC transfusion. - Follow CBC - No active bleeding found, stool was guaiac negative - holding all heparin for now  Acute renal failure - Pt presents with severe dehydration - continue IV fluid hydration -  Renally dose medications, appreciate nephrology consultation  - Daily BMP testing  NSTEMI  - from demand ischemia related to very severe anemia - HS  troponin peaked 3336 - Pt denies chest pain - EKG with ST depression, no ST elevation seen, reviewed with cardiologist at [facility] - see Dr. Harl Bowie consult note.   Not a candidate for heparin/aspirin at this time.  Hypomagnesemia - IV replacement ordered, recheck in AM.   Hyponatremia  - from poor oral intake and severe depression - Treating with IV fluids, follow BMP.  Vitamin B12 deficiency - Started B12 injections  Question of multiple myeloma or MDS - SPEP highly suggestive with M spike - obtain 24 hour urine for UPEP, light chains - consulted heme/onc Dr Delton Coombes saw on 4/18 - Arranged for urgent bone marrow biopsy on 4/19 at [facility]  Hyperglycemia  - Possibly stress induced - check A1c  Hyperlipidemia - by history, lipid panel in AM - resumed home rosuvastatin daily  Essential hypertension - resume metoprolol at reduced dose due to softer BPs   DVT prophylaxis: SCD Code Status: Full  Family Communication: nephew  Telephone update 4/17 Disposition: TBD Status is: Inpatient  Remains inpatient appropriate because:Persistent severe electrolyte disturbances, IV treatments appropriate due to intensity of illness or inability to take PO and Inpatient level of care appropriate due to severity of illness   Dispo: The patient is from: Home              Anticipated d/c is to: TBD              Patient currently is not medically stable to d/c.   Difficult to place patient No   Consultants:   Hem/onc   cardiology  Procedures:     Antimicrobials:     Subjective: Pt  remains very confused and agitated, spitting at staff at times, safety restraints initiated.   Objective: Vitals:   10/31/20 0800 10/31/20 0807 10/31/20 1123 10/31/20 1621  BP: (!) 126/57     Pulse: 70  100 (!) 57  Resp: 17  (!) 32 15  Temp:   98.8 F (37.1 C) 99.5 F (37.5 C)  TempSrc:   Oral Oral  SpO2: 94% 95% 94% 92%  Weight:      Height:        Intake/Output Summary (Last 24  hours) at 10/31/2020 1622 Last data filed at 10/31/2020 0549 Gross per 24 hour  Intake 1676.56 ml  Output 975 ml  Net 701.56 ml   Filed Weights   10/29/20 2224 10/30/20 0107 10/31/20 0343  Weight: 70.4 kg 68.9 kg 70.9 kg    Examination:  General exam: frail thin male, appears stated age, confused, mumbling at times, fidgeting constantly. Appears calm and comfortable  Respiratory system: Clear to auscultation. Respiratory effort normal. Cardiovascular system: normal S1 & S2 heard. No JVD, murmurs, rubs, gallops or clicks. No pedal edema. Gastrointestinal system: Abdomen is nondistended, soft and nontender. No organomegaly or masses felt. Normal bowel sounds heard. Central nervous system: Alert and confused/encephalopathic. No focal neurological deficits. Extremities: Symmetric 5 x 5 power. Skin: No rashes, lesions or ulcers Psychiatry: Judgement and insight appear poor. Mood & affect appear normal.   Data Reviewed: I have personally reviewed following labs and imaging studies  CBC: Recent Labs  Lab 10/29/20 2307 10/30/20 0931 10/31/20 0432  WBC 5.9 5.1 7.2  NEUTROABS 3.3 3.6  --   HGB 3.9* 5.5* 7.7*  HCT 12.0* 16.8* 22.9*  MCV 121.2* 104.3* 97.9  PLT 125* 97* 93*    Basic Metabolic Panel: Recent Labs  Lab 10/29/20 2307 10/30/20 0931 10/31/20 0432  NA 127* 130* 131*  K 3.8 4.3 3.8  CL 96* 100 103  CO2 _0 GLUCOSE 129* 135* 92  BUN 47* 45* 48*  CREATININE 4.50* 3.96* 4.05*  CALCIUM 14.2* 12.8* 11.5*  MG  --  1.7 1.5*    GFR: Estimated Creatinine Clearance: 18.7 mL/min (A) (by C-G formula based on SCr of 4.05 mg/dL (H)).  Liver Function Tests: Recent Labs  Lab 10/29/20 2307 10/30/20 0931 10/31/20 0432  AST 32 39 41  ALT _1 ALKPHOS 64 59 51  BILITOT 0.7 0.6 0.7  PROT 12.4* 11.2* 10.3*  ALBUMIN 2.5* 2.2* 2.0*    CBG: No results for input(s): GLUCAP in the last 168 hours.  Recent Results (from the past 240 hour(s))  Resp Panel by RT-PCR  (Flu A&B, Covid) Nasopharyngeal Swab     Status: None   Collection Time: 10/29/20 10:28 PM   Specimen: Nasopharyngeal Swab; Nasopharyngeal(NP) swabs in vial transport medium  Result Value Ref Range Status   SARS Coronavirus 2 by RT PCR NEGATIVE NEGATIVE Final    Comment: (NOTE) SARS-CoV-2 target nucleic acids are NOT DETECTED.  The SARS-CoV-2 RNA is generally detectable in upper respiratory specimens during the acute phase of infection. The lowest concentration of SARS-CoV-2 viral copies this assay can detect is 138 copies/mL. A negative result does not preclude SARS-Cov-2 infection and should not be used as the sole basis for treatment or other patient management decisions. A negative result may occur with  improper specimen collection/handling, submission of specimen other than nasopharyngeal swab, presence of viral mutation(s) within the areas targeted by this assay, and inadequate number of viral copies(<138 copies/mL). A negative result  must be combined with clinical observations, patient history, and epidemiological information. The expected result is Negative.  Fact Sheet for Patients:  EntrepreneurPulse.com.au  Fact Sheet for Healthcare Providers:  IncredibleEmployment.be  This test is no t yet approved or cleared by the [place] FDA and  has been authorized for detection and/or diagnosis of SARS-CoV-2 by FDA under an Emergency Use Authorization (EUA). This EUA will remain  in effect (meaning this test can be used) for the duration of the COVID-19 declaration under Section 564(b)(1) of the Act, 21 U.S.C.section 360bbb-3(b)(1), unless the authorization is terminated  or revoked sooner.       Influenza A by PCR NEGATIVE NEGATIVE Final   Influenza B by PCR NEGATIVE NEGATIVE Final    Comment: (NOTE) The Xpert Xpress SARS-CoV-2/FLU/RSV plus assay is intended as an aid in the diagnosis of influenza from Nasopharyngeal swab specimens  and should not be used as a sole basis for treatment. Nasal washings and aspirates are unacceptable for Xpert Xpress SARS-CoV-2/FLU/RSV testing.  Fact Sheet for Patients: EntrepreneurPulse.com.au  Fact Sheet for Healthcare Providers: IncredibleEmployment.be  This test is not yet approved or cleared by the [place] FDA and has been authorized for detection and/or diagnosis of SARS-CoV-2 by FDA under an Emergency Use Authorization (EUA). This EUA will remain in effect (meaning this test can be used) for the duration of the COVID-19 declaration under Section 564(b)(1) of the Act, 21 U.S.C. section 360bbb-3(b)(1), unless the authorization is terminated or revoked.  Performed at [facility], [address] [place], [place] [address]   MRSA PCR Screening     Status: None   Collection Time: 10/30/20  1:25 AM   Specimen: Nasal Mucosa; Nasopharyngeal  Result Value Ref Range Status   MRSA by PCR NEGATIVE NEGATIVE Final    Comment:        The GeneXpert MRSA Assay (FDA approved for NASAL specimens only), is one component of a comprehensive MRSA colonization surveillance program. It is not intended to diagnose MRSA infection nor to guide or monitor treatment for MRSA infections. Performed at [facility], [address] [place], [place] [address]      Radiology Studies: CT ABDOMEN PELVIS WO CONTRAST  Result Date: 10/30/2020 CLINICAL DATA:  Cancer of unknown primary, multiple lytic lesions in the calvaria noted incidentally. Dizziness, unintended weight loss. EXAM: CT CHEST, ABDOMEN AND PELVIS WITHOUT CONTRAST TECHNIQUE: Multidetector CT imaging of the chest, abdomen and pelvis was performed following the standard protocol without IV contrast. COMPARISON:  CT head 10/29/2020, chest radiograph 10/29/2020 FINDINGS: CT CHEST FINDINGS Cardiovascular: Cardiomegaly. Coronary artery calcifications. Hypoattenuation of the cardiac blood pool compatible  with anemia. The aortic root is suboptimally assessed given cardiac pulsation artifact. Atherosclerotic plaque within the normal caliber aorta. Shared origin of the brachiocephalic and left common carotid arteries. Proximal great vessels are otherwise unremarkable. Central pulmonary arteries are top-normal caliber. No large central filling defects within limitations of a nonenhanced exam. Mediastinum/Nodes: No mediastinal fluid or gas. Normal thyroid gland and thoracic inlet. No acute abnormality of the trachea or esophagus. No worrisome mediastinal or axillary adenopathy. Hilar nodal evaluation is limited in the absence of intravenous contrast media. Lungs/Pleura: No consolidation, features of edema, pneumothorax, or effusion. No suspicious pulmonary nodules or masses. Some dependent atelectasis. Evaluation slightly limited by mild respiratory motion artifact. Musculoskeletal: Widespread extensive lytic mottling throughout the axial and appendicular skeleton. Multiple rib fractures are seen in various stages of healing many of which are presumed to be pathologic in nature. Severe degenerative  changes are present in the bilateral shoulders including larger heterotopic ossifications seen in the left shoulder recess. Bridging syndesmophytes across much of the thoracic levels and partial bridging across the spinous processes, could reflect some underlying spondyloarthropathy. CT ABDOMEN PELVIS FINDINGS Hepatobiliary: No visible focal liver lesion with limitations of an unenhanced CT. Smooth surface contour. Normal hepatic attenuation. Normal gallbladder and biliary tree without visible calcified gallstone. Pancreas: Mild pancreatic atrophy. No pancreatic ductal dilatation or surrounding inflammatory changes. Spleen: Splenomegaly within enlarged, lobular spleen. No visible focal splenic lesion within the limitations of this unenhanced exam. Adrenals/Urinary Tract: No adrenal mass or hemorrhage. Slight under rotation of  the bilateral kidneys, anatomic variant. No visible or contour deforming renal lesion. No urolithiasis or hydronephrosis. Urinary bladder is largely decompressed at the time of exam and therefore poorly evaluated by CT imaging. Mild circumferential bladder wall thickening may be related underdistention or chronic outlet obstruction given marked indentation of bladder base by an enlarged prostate. Stomach/Bowel: Distal esophagus, stomach and duodenum are unremarkable. No small bowel thickening or dilatation. Normal appendix in the right lower quadrant. Extensive distal colonic diverticulosis without active acute inflammation to suggest active diverticulitis. Some mild mural thickening in the sigmoid may be reflective of prior inflammation though warrants further evaluation with outpatient colonoscopy if not recently performed. Vascular/Lymphatic: Atherosclerotic calcifications within the abdominal aorta and branch vessels. No aneurysm or ectasia. No enlarged abdominopelvic lymph nodes. Reproductive: Enlarged prostate with few typically benign punctate calcifications. Trace right hydrocele. Other: No abdominopelvic free air or fluid. No bowel containing hernia. Musculoskeletal: Extensive lytic foci and bony remodeling throughout the axial and appendicular skeleton of the abdomen and pelvis including the proximal femora as well. Bony ankylosis of the L4-L5 and lumbosacral junction as well as across the bilateral SI joints. IMPRESSION: 1. Extensive lytic foci and bony mottling throughout the axial and appendicular skeleton of the chest, abdomen and pelvis including the proximal femora and humeri as well as multiple bilateral rib fractures in various stages of healing many of which are presumed to be pathologic in nature. Findings are concerning for a diffuse osseous metastatic disease versus multiple myeloma. 2. Mild mural thickening in the sigmoid may be reflective of prior inflammation given the numerous colonic  diverticula within the segment though warrants further evaluation with outpatient colonoscopy if not recently performed. 3. Splenomegaly within enlarged, lobular spleen, nonspecific but can be seen in the setting of myeloproliferative disorder. 4. Cardiomegaly and coronary artery calcifications. Hypoattenuation of the cardiac blood pool suggestive of anemia. 5. Trace right hydrocele. 6. Mild circumferential bladder wall thickening may be related underdistention or chronic outlet obstruction given marked indentation of bladder base by an enlarged prostate. Could correlate with urinalysis to exclude cystitis. 7. Prostatomegaly. 8. Aortic Atherosclerosis (ICD10-I70.0). Electronically Signed   By: Lovena Le M.D.   On: 10/30/2020 01:15   CT Head Wo Contrast  Result Date: 10/29/2020 CLINICAL DATA:  Dizziness, carbon monoxide exposure 2 months prior with COVID few weeks ago. Unintended weight loss. EXAM: CT HEAD WITHOUT CONTRAST TECHNIQUE: Contiguous axial images were obtained from the base of the skull through the vertex without intravenous contrast. COMPARISON:  None. FINDINGS: Brain: No evidence of acute infarction, hemorrhage, hydrocephalus, extra-axial collection, visible mass lesion or mass effect. Symmetric prominence of the ventricles, cisterns and sulci compatible with parenchymal volume loss. Patchy areas of white matter hypoattenuation are most compatible with chronic microvascular angiopathy. Vascular: Atherosclerotic calcification of the carotid siphons. No hyperdense vessel. Skull: Mottled appearance of the bone particularly towards  the clivus and sphenoid bones with multiple sites of lytic lesions, largest in the posterior left parietal bone measuring 19 x 8 mm (3/42) a slightly more expansile lytic focus is also seen in the right temporal bone measuring 15 x 7 mm (3/19) and in the right mastoid measuring 11 x 12 mm (3/9). Additional scattered foci elsewhere. No calvarial fracture. No scalp swelling  or hematoma. Sinuses/Orbits: Nodular mural thickening in the paranasal sinuses, predominantly in the maxillary sinuses. Mastoid air cells are predominantly clear. Middle ear cavities are clear. Other: None IMPRESSION: 1. Mottled appearance of the calvaria and skull base with multitude of lytic lesions concerning for metastatic disease or myeloma. 2. No acute intracranial abnormality is seen within the limitations of an unenhanced CT. Background of chronic microvascular angiopathy and parenchymal volume loss. These results were called by telephone at the time of interpretation on 10/29/2020 at 10:54 pm to provider [facility] ZAMMIT , who verbally acknowledged these results. Electronically Signed   By: Lovena Le M.D.   On: 10/29/2020 22:56   CT Chest Wo Contrast  Result Date: 10/30/2020 CLINICAL DATA:  Cancer of unknown primary, multiple lytic lesions in the calvaria noted incidentally. Dizziness, unintended weight loss. EXAM: CT CHEST, ABDOMEN AND PELVIS WITHOUT CONTRAST TECHNIQUE: Multidetector CT imaging of the chest, abdomen and pelvis was performed following the standard protocol without IV contrast. COMPARISON:  CT head 10/29/2020, chest radiograph 10/29/2020 FINDINGS: CT CHEST FINDINGS Cardiovascular: Cardiomegaly. Coronary artery calcifications. Hypoattenuation of the cardiac blood pool compatible with anemia. The aortic root is suboptimally assessed given cardiac pulsation artifact. Atherosclerotic plaque within the normal caliber aorta. Shared origin of the brachiocephalic and left common carotid arteries. Proximal great vessels are otherwise unremarkable. Central pulmonary arteries are top-normal caliber. No large central filling defects within limitations of a nonenhanced exam. Mediastinum/Nodes: No mediastinal fluid or gas. Normal thyroid gland and thoracic inlet. No acute abnormality of the trachea or esophagus. No worrisome mediastinal or axillary adenopathy. Hilar nodal evaluation is limited in the  absence of intravenous contrast media. Lungs/Pleura: No consolidation, features of edema, pneumothorax, or effusion. No suspicious pulmonary nodules or masses. Some dependent atelectasis. Evaluation slightly limited by mild respiratory motion artifact. Musculoskeletal: Widespread extensive lytic mottling throughout the axial and appendicular skeleton. Multiple rib fractures are seen in various stages of healing many of which are presumed to be pathologic in nature. Severe degenerative changes are present in the bilateral shoulders including larger heterotopic ossifications seen in the left shoulder recess. Bridging syndesmophytes across much of the thoracic levels and partial bridging across the spinous processes, could reflect some underlying spondyloarthropathy. CT ABDOMEN PELVIS FINDINGS Hepatobiliary: No visible focal liver lesion with limitations of an unenhanced CT. Smooth surface contour. Normal hepatic attenuation. Normal gallbladder and biliary tree without visible calcified gallstone. Pancreas: Mild pancreatic atrophy. No pancreatic ductal dilatation or surrounding inflammatory changes. Spleen: Splenomegaly within enlarged, lobular spleen. No visible focal splenic lesion within the limitations of this unenhanced exam. Adrenals/Urinary Tract: No adrenal mass or hemorrhage. Slight under rotation of the bilateral kidneys, anatomic variant. No visible or contour deforming renal lesion. No urolithiasis or hydronephrosis. Urinary bladder is largely decompressed at the time of exam and therefore poorly evaluated by CT imaging. Mild circumferential bladder wall thickening may be related underdistention or chronic outlet obstruction given marked indentation of bladder base by an enlarged prostate. Stomach/Bowel: Distal esophagus, stomach and duodenum are unremarkable. No small bowel thickening or dilatation. Normal appendix in the right lower quadrant. Extensive distal colonic diverticulosis without active  acute  inflammation to suggest active diverticulitis. Some mild mural thickening in the sigmoid may be reflective of prior inflammation though warrants further evaluation with outpatient colonoscopy if not recently performed. Vascular/Lymphatic: Atherosclerotic calcifications within the abdominal aorta and branch vessels. No aneurysm or ectasia. No enlarged abdominopelvic lymph nodes. Reproductive: Enlarged prostate with few typically benign punctate calcifications. Trace right hydrocele. Other: No abdominopelvic free air or fluid. No bowel containing hernia. Musculoskeletal: Extensive lytic foci and bony remodeling throughout the axial and appendicular skeleton of the abdomen and pelvis including the proximal femora as well. Bony ankylosis of the L4-L5 and lumbosacral junction as well as across the bilateral SI joints. IMPRESSION: 1. Extensive lytic foci and bony mottling throughout the axial and appendicular skeleton of the chest, abdomen and pelvis including the proximal femora and humeri as well as multiple bilateral rib fractures in various stages of healing many of which are presumed to be pathologic in nature. Findings are concerning for a diffuse osseous metastatic disease versus multiple myeloma. 2. Mild mural thickening in the sigmoid may be reflective of prior inflammation given the numerous colonic diverticula within the segment though warrants further evaluation with outpatient colonoscopy if not recently performed. 3. Splenomegaly within enlarged, lobular spleen, nonspecific but can be seen in the setting of myeloproliferative disorder. 4. Cardiomegaly and coronary artery calcifications. Hypoattenuation of the cardiac blood pool suggestive of anemia. 5. Trace right hydrocele. 6. Mild circumferential bladder wall thickening may be related underdistention or chronic outlet obstruction given marked indentation of bladder base by an enlarged prostate. Could correlate with urinalysis to exclude cystitis. 7.  Prostatomegaly. 8. Aortic Atherosclerosis (ICD10-I70.0). Electronically Signed   By: Lovena Le M.D.   On: 10/30/2020 01:15   DG Chest Port 1 View  Result Date: 10/31/2020 CLINICAL DATA:  Altered mental status EXAM: PORTABLE CHEST 1 VIEW COMPARISON:  10/29/2020 FINDINGS: New elevation of the right hemidiaphragm with right basilar pleuroparenchymal process. Chronic interstitial changes. No pneumothorax. Similar cardiomediastinal contours. IMPRESSION: New elevation of the right hemidiaphragm with right basilar atelectasis/consolidation and possible pleural effusion. Electronically Signed   By: Macy Mis M.D.   On: 10/31/2020 11:44   DG Chest Port 1 View  Result Date: 10/29/2020 CLINICAL DATA:  Weakness EXAM: PORTABLE CHEST 1 VIEW COMPARISON:  None. FINDINGS: Cardiac shadow is enlarged. Lungs are well aerated bilaterally. Mild vascular congestion is noted without interstitial edema. No focal infiltrate or sizable effusion is seen. Degenerative changes of the shoulder joints and thoracic spine are noted. IMPRESSION: Mild vascular congestion without acute infiltrate or edema. Electronically Signed   By: Inez Catalina M.D.   On: 10/29/2020 23:07   ECHOCARDIOGRAM COMPLETE  Result Date: 10/30/2020    ECHOCARDIOGRAM REPORT   Patient Name:   Paul Norton Date of Exam: 10/30/2020 Medical Rec #:  [id number]             Height:       69.0 in Accession #:    [id number]            Weight:       151.9 lb Date of Birth:  August 27, 1955              BSA:          1.838 m Patient Age:    34 years              BP:           129/58 mmHg Patient Gender: M  HR:           83 bpm. Exam Location:  [facility] Procedure: 2D Echo, Cardiac Doppler and Color Doppler Indications:    Elevated Troponin  History:        Patient has no prior history of Echocardiogram examinations.                 Risk Factors:Hypertension and Dyslipidemia. COVID-19 virus                 infection, Hypercalcemia, Moderate  protein-calorie malnutrition,                 AKI (acute kidney injury) ([place]).  Sonographer:    Alvino Chapel RCS Referring Phys: [place]  1. Left ventricular ejection fraction, by estimation, is 60 to 65%. The left ventricle has normal function. The left ventricle has no regional wall motion abnormalities. Left ventricular diastolic parameters are indeterminate. Elevated left atrial pressure.  2. Right ventricular systolic function is mildly reduced. The right ventricular size is moderately enlarged. There is moderately elevated pulmonary artery systolic pressure.  3. Left atrial size was severely dilated.  4. Right atrial size was severely dilated.  5. The mitral valve is normal in structure. Mild mitral valve regurgitation. No evidence of mitral stenosis.  6. The aortic valve is tricuspid. Aortic valve regurgitation is not visualized. No aortic stenosis is present.  7. The inferior vena cava is dilated in size with <50% respiratory variability, suggesting right atrial pressure of 15 mmHg. FINDINGS  Left Ventricle: Left ventricular ejection fraction, by estimation, is 60 to 65%. The left ventricle has normal function. The left ventricle has no regional wall motion abnormalities. The left ventricular internal cavity size was normal in size. There is  no left ventricular hypertrophy. Left ventricular diastolic parameters are indeterminate. Elevated left atrial pressure. Right Ventricle: The right ventricular size is moderately enlarged. Right vetricular wall thickness was not assessed. Right ventricular systolic function is mildly reduced. There is moderately elevated pulmonary artery systolic pressure. The tricuspid regurgitant velocity is 2.99 m/s, and with an assumed right atrial pressure of 15 mmHg, the estimated right ventricular systolic pressure is 27.2 mmHg. Left Atrium: Left atrial size was severely dilated. Right Atrium: Right atrial size was severely dilated. Pericardium: There  is no evidence of pericardial effusion. Mitral Valve: The mitral valve is normal in structure. Mild mitral valve regurgitation. No evidence of mitral valve stenosis. Tricuspid Valve: The tricuspid valve is normal in structure. Tricuspid valve regurgitation is mild . No evidence of tricuspid stenosis. Aortic Valve: The aortic valve is tricuspid. Aortic valve regurgitation is not visualized. No aortic stenosis is present. Aortic valve mean gradient measures 5.8 mmHg. Aortic valve peak gradient measures 12.0 mmHg. Aortic valve area, by VTI measures 3.01  cm. Pulmonic Valve: The pulmonic valve was not well visualized. Pulmonic valve regurgitation is not visualized. No evidence of pulmonic stenosis. Aorta: The aortic root is normal in size and structure. Pulmonary Artery: Moderate pulmonary HTN, PASP is 51 mmHg. Venous: The inferior vena cava is dilated in size with less than 50% respiratory variability, suggesting right atrial pressure of 15 mmHg. IAS/Shunts: No atrial level shunt detected by color flow Doppler.  LEFT VENTRICLE PLAX 2D LVIDd:         5.20 cm  Diastology LVIDs:         3.50 cm  LV e' medial:    6.20 cm/s LV PW:  0.90 cm  LV E/e' medial:  23.2 LV IVS:        1.10 cm  LV e' lateral:   13.80 cm/s LVOT diam:     2.20 cm  LV E/e' lateral: 10.4 LV SV:         98 LV SV Index:   53 LVOT Area:     3.80 cm  RIGHT VENTRICLE RV S prime:     15.90 cm/s TAPSE (M-mode): 2.3 cm LEFT ATRIUM              Index       RIGHT ATRIUM           Index LA diam:        4.90 cm  2.67 cm/m  RA Area:     27.80 cm LA Vol (A2C):   141.0 ml 76.71 ml/m RA Volume:   96.80 ml  52.67 ml/m LA Vol (A4C):   128.0 ml 69.64 ml/m LA Biplane Vol: 138.0 ml 75.08 ml/m  AORTIC VALVE AV Area (Vmax):    2.79 cm AV Area (Vmean):   2.74 cm AV Area (VTI):     3.01 cm AV Vmax:           172.87 cm/s AV Vmean:          112.345 cm/s AV VTI:            0.324 m AV Peak Grad:      12.0 mmHg AV Mean Grad:      5.8 mmHg LVOT Vmax:         127.00  cm/s LVOT Vmean:        80.900 cm/s LVOT VTI:          0.257 m LVOT/AV VTI ratio: 0.79  AORTA Ao Root diam: 3.00 cm MITRAL VALVE                 TRICUSPID VALVE MV Area (PHT): 4.54 cm      TR Peak grad:   35.8 mmHg MV Decel Time: 167 msec      TR Vmax:        299.00 cm/s MR Peak grad:    94.9 mmHg MR Mean grad:    62.0 mmHg   SHUNTS MR Vmax:         487.00 cm/s Systemic VTI:  0.26 m MR Vmean:        371.0 cm/s  Systemic Diam: 2.20 cm MR PISA:         2.26 cm MR PISA Eff ROA: 15 mm MR PISA Radius:  0.60 cm MV E velocity: 144.00 cm/s MV A velocity: 45.90 cm/s MV E/A ratio:  3.14 Carlyle Dolly MD Electronically signed by Carlyle Dolly MD Signature Date/Time: 10/30/2020/12:07:16 PM    Final     Scheduled Meds: . Chlorhexidine Gluconate Cloth  6 each Topical Daily  . cyanocobalamin  1,000 mcg Intramuscular Daily  . feeding supplement  237 mL Oral BID BM  . fluticasone furoate-vilanterol  1 puff Inhalation Daily  . rosuvastatin  10 mg Oral QHS   Continuous Infusions: . sodium chloride 75 mL/hr at 10/31/20 1209     LOS: 1 day   Critical Care Procedure Note Authorized and Performed by: Murvin Natal MD  Total Critical Care time: 57  mins Due to a high probability of clinically significant, life threatening deterioration, the patient required my highest level of preparedness to intervene emergently and I personally spent this critical care time directly and personally managing  the patient.  This critical care time included obtaining a history; examining the patient, pulse oximetry; ordering and review of studies; arranging urgent treatment with development of a management plan; evaluation of patient's response of treatment; frequent reassessment; and discussions with other providers.  This critical care time was performed to assess and manage the high probability of imminent and life threatening deterioration that could result in multi-organ failure.  It was exclusive of separately billable procedures  and treating other patients and teaching time.    Irwin Brakeman, MD How to contact the [facility] Attending or Consulting provider [place] or covering provider during after hours [place], for this patient?  1. Check the care team in [facility] and look for a) attending/consulting [facility] provider listed and b) the [facility] team listed 2. Log into [email or web address] and use [place]'s universal password to access. If you do not have the password, please contact the hospital operator. 3. Locate the [facility] provider you are looking for under [facility] and page to a number that you can be directly reached. 4. If you still have difficulty reaching the provider, please page the [facility] (Director on Call) for the Hospitalists listed on amion for assistance.  10/31/2020, 4:22 PM

## 2020-10-31 NOTE — Progress Notes (Signed)
Troponin called at 0600 on 10/31/20 of 3946. MD notified. Awaiting response/orders

## 2020-10-31 NOTE — Consult Note (Signed)
[facility] Renal Consultation Note  Requesting MD: Wynetta Emery Indication for Consultation: elevated crt in the setting of myeloma  HPI:  Paul Norton is a 65 y.o. male with past medical history significant for HTN, hyperlipidemia and positive SPEP in 03/2020 with a crt of 1.23 and a calcium of 10.1 and hgb of 10.8.  He refused referral to hematology/oncology at that time.  He now is brought to the hospital by nephew for confusion.  Initial eval found hgb of 3.9, calcium of 14.2 with crt of 4.5 and evidence of extensive lytic mottling on bones-  Treated with pamidronate on 4/16 and calcium down to 11.5- crt improved to 4.  Transfused with improvement in hgb.  Today is confused-  Cannot tell me year or where he is.  Also troponin was high. Urine output 975 last 24 hours   Creat  Date/Time Value Ref Range Status  04/11/2017 11:14 AM 1.03 0.70 - 1.25 mg/dL Final    Comment:    For patients >64 years of age, the reference limit for Creatinine is approximately 13% higher for people identified as African-American. .    Creatinine, Ser  Date/Time Value Ref Range Status  10/31/2020 04:32 AM 4.05 (H) 0.61 - 1.24 mg/dL Final  10/30/2020 09:31 AM 3.96 (H) 0.61 - 1.24 mg/dL Final  10/29/2020 11:07 PM 4.50 (H) 0.61 - 1.24 mg/dL Final  03/31/2020 01:05 PM 1.23 0.76 - 1.27 mg/dL Final  06/18/2018 12:15 PM 1.16 0.76 - 1.27 mg/dL Final     PMHx:   Past Medical History:  Diagnosis Date  . Colonoscopy refused 09/2017  . Hyperlipidemia   . Hypertension   . Refuses treatment 09/2017   refuses screenings such as cancer screening, refuses vaccines, refuses normal preventative care  . Vaccine refused by patient    all vaccines as of 09/2017    Past Surgical History:  Procedure Laterality Date  . COLONOSCOPY     never, declines as of 09/2017    Family Hx: History reviewed. No pertinent family history.  Social History:  reports that he has never smoked. He has never used  smokeless tobacco. He reports that he does not drink alcohol and does not use drugs.  Allergies: No Known Allergies  Medications: Prior to Admission medications   Medication Sig Start Date End Date Taking? Authorizing Provider  amLODipine (NORVASC) 10 MG tablet Take 1 tablet (10 mg total) by mouth daily. 03/31/20  Yes Tysinger, Camelia Eng, PA-C  metoprolol tartrate (LOPRESSOR) 100 MG tablet Take 1 tablet (100 mg total) by mouth 2 (two) times daily. 03/31/20  Yes Tysinger, Camelia Eng, PA-C  aspirin EC 81 MG tablet Take 1 tablet (81 mg total) by mouth daily. Patient not taking: Reported on 10/31/2020 03/31/20   Tysinger, Camelia Eng, PA-C  azithromycin (ZITHROMAX) 250 MG tablet 2 tablets day 1, then 1 tablet days 2-4 Patient not taking: No sig reported 09/06/20   Carlena Hurl, PA-C  fluticasone furoate-vilanterol (BREO ELLIPTA) 200-25 MCG/INH AEPB Inhale 1 puff into the lungs daily. Patient not taking: Reported on 10/31/2020 09/06/20   Tysinger, Camelia Eng, PA-C  Multiple Vitamins-Minerals (EMERGEN-C IMMUNE PLUS) PACK Take 1 tablet by mouth 2 (two) times daily. Patient not taking: Reported on 10/31/2020 09/06/20   Carlena Hurl, PA-C  predniSONE (DELTASONE) 10 MG tablet 6/5/4/3/2/1 taper Patient not taking: No sig reported 09/06/20   Carlena Hurl, PA-C  promethazine-dextromethorphan (PROMETHAZINE-DM) 6.25-15 MG/5ML syrup Take 5 mLs by mouth 4 (four) times daily as needed  for cough. Patient not taking: No sig reported 09/06/20   Carlena Hurl, PA-C  rosuvastatin (CRESTOR) 10 MG tablet Take 1 tablet (10 mg total) by mouth at bedtime. Patient not taking: No sig reported 03/31/20 03/31/21  Tysinger, Camelia Eng, PA-C    I have reviewed the patient's current medications.  Labs:  Results for orders placed or performed during the hospital encounter of 10/29/20 (from the past 48 hour(s))  Resp Panel by RT-PCR (Flu A&B, Covid) Nasopharyngeal Swab     Status: None   Collection Time: 10/29/20 10:28 PM    Specimen: Nasopharyngeal Swab; Nasopharyngeal(NP) swabs in vial transport medium  Result Value Ref Range   SARS Coronavirus 2 by RT PCR NEGATIVE NEGATIVE    Comment: (NOTE) SARS-CoV-2 target nucleic acids are NOT DETECTED.  The SARS-CoV-2 RNA is generally detectable in upper respiratory specimens during the acute phase of infection. The lowest concentration of SARS-CoV-2 viral copies this assay can detect is 138 copies/mL. A negative result does not preclude SARS-Cov-2 infection and should not be used as the sole basis for treatment or other patient management decisions. A negative result may occur with  improper specimen collection/handling, submission of specimen other than nasopharyngeal swab, presence of viral mutation(s) within the areas targeted by this assay, and inadequate number of viral copies(<138 copies/mL). A negative result must be combined with clinical observations, patient history, and epidemiological information. The expected result is Negative.  Fact Sheet for Patients:  EntrepreneurPulse.com.au  Fact Sheet for Healthcare Providers:  IncredibleEmployment.be  This test is no t yet approved or cleared by the [place] FDA and  has been authorized for detection and/or diagnosis of SARS-CoV-2 by FDA under an Emergency Use Authorization (EUA). This EUA will remain  in effect (meaning this test can be used) for the duration of the COVID-19 declaration under Section 564(b)(1) of the Act, 21 U.S.C.section 360bbb-3(b)(1), unless the authorization is terminated  or revoked sooner.       Influenza A by PCR NEGATIVE NEGATIVE   Influenza B by PCR NEGATIVE NEGATIVE    Comment: (NOTE) The Xpert Xpress SARS-CoV-2/FLU/RSV plus assay is intended as an aid in the diagnosis of influenza from Nasopharyngeal swab specimens and should not be used as a sole basis for treatment. Nasal washings and aspirates are unacceptable for Xpert Xpress  SARS-CoV-2/FLU/RSV testing.  Fact Sheet for Patients: EntrepreneurPulse.com.au  Fact Sheet for Healthcare Providers: IncredibleEmployment.be  This test is not yet approved or cleared by the [place] FDA and has been authorized for detection and/or diagnosis of SARS-CoV-2 by FDA under an Emergency Use Authorization (EUA). This EUA will remain in effect (meaning this test can be used) for the duration of the COVID-19 declaration under Section 564(b)(1) of the Act, 21 U.S.C. section 360bbb-3(b)(1), unless the authorization is terminated or revoked.  Performed at [facility], [address] [place], [place] [address]   CBC with Differential/Platelet     Status: Abnormal   Collection Time: 10/29/20 11:07 PM  Result Value Ref Range   WBC 5.9 4.0 - 10.5 K/uL   RBC 0.99 (L) 4.22 - 5.81 MIL/uL   Hemoglobin 3.9 (LL) 13.0 - 17.0 g/dL    Comment: This critical result has verified and been called to [facility] by Duncan Dull on 04 16 2022 at 2330, and has been read back.    HCT 12.0 (L) 39.0 - 52.0 %   MCV 121.2 (H) 80.0 - 100.0 fL   MCH 39.4 (H) 26.0 - 34.0 pg  MCHC 32.5 30.0 - 36.0 g/dL   RDW 14.3 11.5 - 15.5 %   Platelets 125 (L) 150 - 400 K/uL   nRBC 0.0 0.0 - 0.2 %   Neutrophils Relative % 56 %   Neutro Abs 3.3 1.7 - 7.7 K/uL   Lymphocytes Relative 28 %   Lymphs Abs 1.7 0.7 - 4.0 K/uL   Monocytes Relative 16 %   Monocytes Absolute 0.9 0.1 - 1.0 K/uL   Eosinophils Relative 0 %   Eosinophils Absolute 0.0 0.0 - 0.5 K/uL   Basophils Relative 0 %   Basophils Absolute 0.0 0.0 - 0.1 K/uL   WBC Morphology Abnormal lymphocytes present     Comment: Performed at [facility], [address] [place], [place] [address]  Comprehensive metabolic panel     Status: Abnormal   Collection Time: 10/29/20 11:07 PM  Result Value Ref Range   Sodium 127 (L) 135 - 145 mmol/L   Potassium 3.8 3.5 - 5.1 mmol/L   Chloride 96 (L) 98 - 111 mmol/L   CO2 24 22 - 32  mmol/L   Glucose, Bld 129 (H) 70 - 99 mg/dL    Comment: Glucose reference range applies only to samples taken after fasting for at least 8 hours.   BUN 47 (H) 8 - 23 mg/dL   Creatinine, Ser 4.50 (H) 0.61 - 1.24 mg/dL   Calcium 14.2 (HH) 8.9 - 10.3 mg/dL    Comment: RESULT VERIFIED WITH REPEAT COLLECTION AND ANALYSIS CRITICAL RESULT CALLED TO, READ BACK BY AND VERIFIED WITH: Lynann Bologna, A 2358 10/29/2020 COLEMAN,R    Total Protein 12.4 (H) 6.5 - 8.1 g/dL   Albumin 2.5 (L) 3.5 - 5.0 g/dL   AST 32 15 - 41 U/L   ALT 18 0 - 44 U/L   Alkaline Phosphatase 64 38 - 126 U/L   Total Bilirubin 0.7 0.3 - 1.2 mg/dL   GFR, Estimated 14 (L) >60 mL/min    Comment: (NOTE) Calculated using the CKD-EPI Creatinine Equation (2021)    Anion gap 7 5 - 15    Comment: Performed at [facility], [address], [place], [place] [address]  Troponin I (High Sensitivity)     Status: None   Collection Time: 10/29/20 11:07 PM  Result Value Ref Range   Troponin I (High Sensitivity) 12 <18 ng/L    Comment: (NOTE) Elevated high sensitivity troponin I (hsTnI) values and significant  changes across serial measurements may suggest ACS but many other  chronic and acute conditions are known to elevate hsTnI results.  Refer to the Links section for chest pain algorithms and additional  guidance. Performed at [facility], [address] [place], [place] [address]   Ammonia     Status: None   Collection Time: 10/29/20 11:07 PM  Result Value Ref Range   Ammonia 13 9 - 35 umol/L    Comment: Performed at [facility], [address] [place], [place] [address]  TSH     Status: Abnormal   Collection Time: 10/29/20 11:07 PM  Result Value Ref Range   TSH 5.278 (H) 0.350 - 4.500 uIU/mL    Comment: Performed by a 3rd Generation assay with a functional sensitivity of <=0.01 uIU/mL. Performed at [facility], [address] [place],  [address]   CK     Status: Abnormal   Collection Time: 10/29/20 11:07 PM  Result Value  Ref Range   Total CK 17 (L) 49 - 397 U/L    Comment: Performed at  [facility], [address] [place], [place] [address]  Type and screen [facility]     Status: None   Collection Time: 10/29/20 11:07 PM  Result Value Ref Range   ABO/RH(D) B POS    Antibody Screen NEG    Sample Expiration 11/01/2020,2359    Unit Number [id number]    Blood Component Type RED CELLS,LR    Unit division 00    Status of Unit ISSUED,FINAL    Transfusion Status OK TO TRANSFUSE    Crossmatch Result Compatible    Unit Number [id number]    Blood Component Type RED CELLS,LR    Unit division 00    Status of Unit ISSUED,FINAL    Transfusion Status OK TO TRANSFUSE    Crossmatch Result Compatible    Unit Number [id number]    Blood Component Type RBC LR PHER1    Unit division 00    Status of Unit ISSUED,FINAL    Transfusion Status OK TO TRANSFUSE    Crossmatch Result      Compatible Performed at [facility], [address], [place], [place] [address]    Unit Number [id number]    Blood Component Type RED CELLS,LR    Unit division 00    Status of Unit ISSUED,FINAL    Transfusion Status OK TO TRANSFUSE    Crossmatch Result Compatible   Vitamin B12     Status: Abnormal   Collection Time: 10/29/20 11:07 PM  Result Value Ref Range   Vitamin B-12 106 (L) 180 - 914 pg/mL    Comment: (NOTE) This assay is not validated for testing neonatal or myeloproliferative syndrome specimens for Vitamin B12 levels. Performed at [facility], [address], [place], [place] [address]   Folate     Status: None   Collection Time: 10/29/20 11:07 PM  Result Value Ref Range   Folate 8.3 >5.9 ng/mL    Comment: Performed at [facility], [address], [place], [place] [address]  Iron and TIBC     Status: Abnormal   Collection Time: 10/29/20 11:07 PM  Result Value Ref Range   Iron 124 45 - 182 ug/dL   TIBC 266 250 - 450 ug/dL   Saturation Ratios 47 (H) 17.9 - 39.5 %   UIBC 142 ug/dL    Comment:  Performed at [facility], [address] [place], [place] [address]  Ferritin     Status: Abnormal   Collection Time: 10/29/20 11:07 PM  Result Value Ref Range   Ferritin 627 (H) 24 - 336 ng/mL    Comment: Performed at [facility], [address] [place], [place] [address]  Reticulocytes     Status: Abnormal   Collection Time: 10/29/20 11:07 PM  Result Value Ref Range   Retic Ct Pct 1.3 0.4 - 3.1 %   RBC. 1.02 (L) 4.22 - 5.81 MIL/uL   Retic Count, Absolute 12.8 (L) 19.0 - 186.0 K/uL   Immature Retic Fract 16.5 (H) 2.3 - 15.9 %    Comment: Performed at [facility], [address], [place],  [address]  Urinalysis, Routine w reflex microscopic Urine, Clean Catch     Status: Abnormal   Collection Time: 10/29/20 11:56 PM  Result Value Ref Range   Color, Urine YELLOW YELLOW   APPearance HAZY (A) CLEAR   Specific Gravity, Urine 1.014 1.005 - 1.030   pH 7.0 5.0 - 8.0   Glucose, UA NEGATIVE NEGATIVE mg/dL   Hgb urine dipstick NEGATIVE NEGATIVE   Bilirubin Urine  NEGATIVE NEGATIVE   Ketones, ur NEGATIVE NEGATIVE mg/dL   Protein, ur 100 (A) NEGATIVE mg/dL   Nitrite NEGATIVE NEGATIVE   Leukocytes,Ua NEGATIVE NEGATIVE   RBC / HPF 0-5 0 - 5 RBC/hpf   WBC, UA 0-5 0 - 5 WBC/hpf   Bacteria, UA NONE SEEN NONE SEEN   Ca Oxalate Crys, UA PRESENT     Comment: Performed at [facility], [address], [place], [place] [address]  Rapid urine drug screen (hospital performed)     Status: None   Collection Time: 10/29/20 11:56 PM  Result Value Ref Range   Opiates NONE DETECTED NONE DETECTED   Cocaine NONE DETECTED NONE DETECTED   Benzodiazepines NONE DETECTED NONE DETECTED   Amphetamines NONE DETECTED NONE DETECTED   Tetrahydrocannabinol NONE DETECTED NONE DETECTED   Barbiturates NONE DETECTED NONE DETECTED    Comment: (NOTE) DRUG SCREEN FOR MEDICAL PURPOSES ONLY.  IF CONFIRMATION IS NEEDED FOR ANY PURPOSE, NOTIFY LAB WITHIN 5 DAYS.  LOWEST DETECTABLE LIMITS FOR URINE DRUG SCREEN Drug  Class                     Cutoff (ng/mL) Amphetamine and metabolites    1000 Barbiturate and metabolites    200 Benzodiazepine                 937 Tricyclics and metabolites     300 Opiates and metabolites        300 Cocaine and metabolites        300 THC                            50 Performed at [facility], [address], [place], [place] [address]   Prepare RBC (crossmatch)     Status: None   Collection Time: 10/30/20 12:05 AM  Result Value Ref Range   Order Confirmation      ORDER PROCESSED BY BLOOD BANK Performed at [facility], [address] [place], [place] [address]   ABO/Rh     Status: None   Collection Time: 10/30/20 12:05 AM  Result Value Ref Range   ABO/RH(D)      B POS Performed at [facility], [address], [place], [place] [address]   .Cooxemetry Panel (carboxy, met, total hgb, O2 sat)     Status: Abnormal   Collection Time: 10/30/20 12:14 AM  Result Value Ref Range   Total hemoglobin 3.5 (LL) 12.0 - 16.0 g/dL    Comment: CRITICAL RESULT CALLED TO, READ BACK BY AND VERIFIED WITH: HARRIS,B 2215 10/30/2020 COLEMAN,R    O2 Saturation 96.4 %   Carboxyhemoglobin 2.5 (H) 0.5 - 1.5 %   Methemoglobin 0.9 0.0 - 1.5 %    Comment: Performed at [facility], [address], [place], [place] [address]  MRSA PCR Screening     Status: None   Collection Time: 10/30/20  1:25 AM   Specimen: Nasal Mucosa; Nasopharyngeal  Result Value Ref Range   MRSA by PCR NEGATIVE NEGATIVE    Comment:        The GeneXpert MRSA Assay (FDA approved for NASAL specimens only), is one component of a comprehensive MRSA colonization surveillance program. It is not intended to diagnose MRSA infection nor to guide or monitor treatment for MRSA infections. Performed at [facility], [address], [place],  [address]   Troponin I (High Sensitivity)     Status: Abnormal   Collection Time: 10/30/20  2:04 AM  Result Value Ref Range   Troponin I (High Sensitivity) 78 (H) <18 ng/L     Comment: DELTA CHECK NOTED RESULT CALLED TO, READ BACK BY AND VERIFIED WITH: KINDLEY,C @ 0259 ON 10/30/20 BY JUW (NOTE) Elevated high sensitivity troponin I (hsTnI) values and significant  changes across serial measurements may suggest ACS but many other  chronic and acute conditions are known to elevate hsTnI results.  Refer to the Links section for chest pain algorithms and additional  guidance. Performed at [facility], [address] [place], [place] [address]   HIV Antibody (routine testing w rflx)     Status: None   Collection Time: 10/30/20  2:04 AM  Result Value Ref Range   HIV Screen 4th Generation wRfx Non Reactive Non Reactive    Comment: Performed at [facility], [place] [address] [place], [place] [address]  CBC with Differential/Platelet     Status: Abnormal   Collection Time: 10/30/20  9:31 AM  Result Value Ref Range   WBC 5.1 4.0 - 10.5 K/uL   RBC 1.61 (L) 4.22 - 5.81 MIL/uL   Hemoglobin 5.5 (LL) 13.0 - 17.0 g/dL    Comment: REPEATED TO VERIFY THIS CRITICAL RESULT HAS VERIFIED AND BEEN CALLED TO [place] BY LATISHA HENDERSON ON 04 17 2022 AT 1001, AND HAS BEEN READ BACK.     HCT 16.8 (L) 39.0 - 52.0 %   MCV 104.3 (H) 80.0 - 100.0 fL    Comment: POST TRANSFUSION SPECIMEN REPEATED TO VERIFY DELTA CHECK NOTED    MCH 34.2 (H) 26.0 - 34.0 pg   MCHC 32.7 30.0 - 36.0 g/dL   RDW Not Measured 11.5 - 15.5 %   Platelets 97 (L) 150 - 400 K/uL    Comment: Immature Platelet Fraction may be clinically indicated, consider ordering this additional test [id number]    nRBC 0.0 0.0 - 0.2 %   Neutrophils Relative % 71 %   Neutro Abs 3.6 1.7 - 7.7 K/uL   Lymphocytes Relative 13 %   Lymphs Abs 0.7 0.7 - 4.0 K/uL   Monocytes Relative 15 %   Monocytes Absolute 0.8 0.1 - 1.0 K/uL   Eosinophils Relative 0 %   Eosinophils Absolute 0.0 0.0 - 0.5 K/uL   Basophils Relative 0 %   Basophils Absolute 0.0 0.0 - 0.1 K/uL   Immature Granulocytes 1 %   Abs Immature Granulocytes 0.06  0.00 - 0.07 K/uL    Comment: Performed at [facility], [address] [place], [place] [address]  Comprehensive metabolic panel     Status: Abnormal   Collection Time: 10/30/20  9:31 AM  Result Value Ref Range   Sodium 130 (L) 135 - 145 mmol/L   Potassium 4.3 3.5 - 5.1 mmol/L   Chloride 100 98 - 111 mmol/L   CO2 25 22 - 32 mmol/L   Glucose, Bld 135 (H) 70 - 99 mg/dL    Comment: Glucose reference range applies only to samples taken after fasting for at least 8 hours.   BUN 45 (H) 8 - 23 mg/dL   Creatinine, Ser 3.96 (H) 0.61 - 1.24 mg/dL   Calcium 12.8 (H) 8.9 - 10.3 mg/dL   Total Protein 11.2 (H) 6.5 - 8.1 g/dL   Albumin 2.2 (L) 3.5 - 5.0 g/dL   AST 39 15 - 41 U/L   ALT 18 0 - 44 U/L   Alkaline Phosphatase 59 38 - 126 U/L   Total Bilirubin 0.6 0.3 -  1.2 mg/dL   GFR, Estimated 16 (L) >60 mL/min    Comment: (NOTE) Calculated using the CKD-EPI Creatinine Equation (2021)    Anion gap 5 5 - 15    Comment: Performed at [facility], [address], [place], [place] [address]  Magnesium     Status: None   Collection Time: 10/30/20  9:31 AM  Result Value Ref Range   Magnesium 1.7 1.7 - 2.4 mg/dL    Comment: Performed at [facility], [address], [place], [place] [address]  Reticulocytes     Status: Abnormal   Collection Time: 10/30/20  9:31 AM  Result Value Ref Range   Retic Ct Pct 1.2 0.4 - 3.1 %   RBC. 1.61 (L) 4.22 - 5.81 MIL/uL   Retic Count, Absolute 19.8 19.0 - 186.0 K/uL   Immature Retic Fract 13.0 2.3 - 15.9 %    Comment: Performed at [facility], [address], [place], [place] [address]  Folate     Status: None   Collection Time: 10/30/20  9:31 AM  Result Value Ref Range   Folate 8.8 >5.9 ng/mL    Comment: Performed at [facility], [address] [place], [place] [address]  Troponin I (High Sensitivity)     Status: Abnormal   Collection Time: 10/30/20  9:31 AM  Result Value Ref Range   Troponin I (High Sensitivity) 2,985 (HH) <18 ng/L    Comment: CRITICAL RESULT  CALLED TO, READ BACK BY AND VERIFIED WITH: A SHELTON 4/17 @ 1031 BY S BEARD (NOTE) Elevated high sensitivity troponin I (hsTnI) values and significant  changes across serial measurements may suggest ACS but many other  chronic and acute conditions are known to elevate hsTnI results.  Refer to the Links section for chest pain algorithms and additional  guidance. Performed at [facility], [address] [place], [place] [address]   Vitamin B12     Status: Abnormal   Collection Time: 10/30/20  9:31 AM  Result Value Ref Range   Vitamin B-12 132 (L) 180 - 914 pg/mL    Comment: (NOTE) This assay is not validated for testing neonatal or myeloproliferative syndrome specimens for Vitamin B12 levels. Performed at [facility], [address], [place], [place] [address]   Iron and TIBC     Status: Abnormal   Collection Time: 10/30/20  9:31 AM  Result Value Ref Range   Iron 101 45 - 182 ug/dL   TIBC 242 (L) 250 - 450 ug/dL   Saturation Ratios 42 (H) 17.9 - 39.5 %   UIBC 141 ug/dL    Comment: Performed at [facility], [address], [place], [place] [address]  Ferritin     Status: Abnormal   Collection Time: 10/30/20  9:31 AM  Result Value Ref Range   Ferritin 1,406 (H) 24 - 336 ng/mL    Comment: Performed at [facility], [address] [place], [place] [address]  Lactate dehydrogenase     Status: Abnormal   Collection Time: 10/30/20  9:31 AM  Result Value Ref Range   LDH 86 (L) 98 - 192 U/L    Comment: Performed at [facility], [address], [place], [place] [address]  VITAMIN D 25 Hydroxy (Vit-D Deficiency, Fractures)     Status: None   Collection Time: 10/30/20  9:31 AM  Result Value Ref Range   Vit D, 25-Hydroxy 80.38 30 - 100 ng/mL    Comment: (NOTE) Vitamin D deficiency has been defined by the Institute of  Medicine  and an Endocrine Society practice guideline as a level of serum 25-OH  vitamin D less than 20 ng/mL (1,2). The Endocrine Society went on to  further define  vitamin D insufficiency as a level between 21 and 29  ng/mL (2).  1. IOM (Institute of Medicine). 2010. Dietary reference intakes for  calcium and D. [place]: The [employer]. 2. Holick MF, Binkley [place], Bischoff-Ferrari HA, et al. Evaluation,  treatment, and prevention of vitamin D deficiency: an Endocrine  Society clinical practice guideline, JCEM. 2011 Jul; 96(7): 1911-30.  Performed at [facility], [place] [address], [place], [place] [address]   Hemoglobin A1c     Status: None   Collection Time: 10/30/20  9:31 AM  Result Value Ref Range   Hgb A1c MFr Bld 5.6 4.8 - 5.6 %    Comment: (NOTE)         Prediabetes: 5.7 - 6.4         Diabetes: >6.4         Glycemic control for adults with diabetes: <7.0    Mean Plasma Glucose 114 mg/dL    Comment: (NOTE) Performed At: [facility] [place], [place] [id number] Rush Farmer MD SJ:[id number]   T4, free     Status: None   Collection Time: 10/30/20  5:41 PM  Result Value Ref Range   Free T4 0.87 0.61 - 1.12 ng/dL    Comment: (NOTE) Biotin ingestion may interfere with free T4 tests. If the results are inconsistent with the TSH level, previous test results, or the clinical presentation, then consider biotin interference. If needed, order repeat testing after stopping biotin. Performed at [facility], [place] [address] [place], [place] [address]   Troponin I (High Sensitivity)     Status: Abnormal   Collection Time: 10/30/20  9:12 PM  Result Value Ref Range   Troponin I (High Sensitivity) 3,519 (HH) <18 ng/L    Comment: CRITICAL RESULT CALLED TO, READ BACK BY AND VERIFIED WITH: KINDLEY,C 2220 10/30/2020 COLEMAN,R (NOTE) Elevated high sensitivity troponin I (hsTnI) values and significant  changes across serial measurements may suggest ACS but many other  chronic and acute conditions are known to elevate hsTnI results.  Refer to the Links section for chest pain algorithms and  additional  guidance. Performed at [facility], [address] [place], [place] [address]   Troponin I (High Sensitivity)     Status: Abnormal   Collection Time: 10/31/20  2:29 AM  Result Value Ref Range   Troponin I (High Sensitivity) 3,537 (HH) <18 ng/L    Comment: CRITICAL RESULT CALLED TO, READ BACK BY AND VERIFIED WITH: KINDLY,C AT 4:55AM ON 10/31/20 BY FESTERMAN,C (NOTE) Elevated high sensitivity troponin I (hsTnI) values and significant  changes across serial measurements may suggest ACS but many other  chronic and acute conditions are known to elevate hsTnI results.  Refer to the Links section for chest pain algorithms and additional  guidance. Performed at [facility], [address], [place], [place] [address]   Troponin I (High Sensitivity)     Status: Abnormal   Collection Time: 10/31/20  4:30 AM  Result Value Ref Range   Troponin I (High Sensitivity) 3,946 (HH) <18 ng/L    Comment: CRITICAL RESULT CALLED TO, READ BACK BY AND VERIFIED WITH: HEARN,J AT 6:00AM ON 10/31/20 BY FESTERMAN,C (NOTE) Elevated high sensitivity troponin I (hsTnI) values and significant  changes across serial measurements may suggest ACS but many  other  chronic and acute conditions are known to elevate hsTnI results.  Refer to the Links section for chest pain algorithms and additional  guidance. Performed at [facility], [address], [place], [place] [address]   CBC     Status: Abnormal   Collection Time: 10/31/20  4:32 AM  Result Value Ref Range   WBC 7.2 4.0 - 10.5 K/uL   RBC 2.34 (L) 4.22 - 5.81 MIL/uL   Hemoglobin 7.7 (L) 13.0 - 17.0 g/dL    Comment: REPEATED TO VERIFY POST TRANSFUSION SPECIMEN    HCT 22.9 (L) 39.0 - 52.0 %   MCV 97.9 80.0 - 100.0 fL   MCH 32.9 26.0 - 34.0 pg   MCHC 33.6 30.0 - 36.0 g/dL   RDW 23.1 (H) 11.5 - 15.5 %   Platelets 93 (L) 150 - 400 K/uL    Comment: SPECIMEN CHECKED FOR CLOTS Immature Platelet Fraction may be clinically indicated,  consider ordering this additional test [id number] CONSISTENT WITH PREVIOUS RESULT    nRBC 0.0 0.0 - 0.2 %    Comment: Performed at [facility], [address] [place], [place] [address]  Comprehensive metabolic panel     Status: Abnormal   Collection Time: 10/31/20  4:32 AM  Result Value Ref Range   Sodium 131 (L) 135 - 145 mmol/L   Potassium 3.8 3.5 - 5.1 mmol/L   Chloride 103 98 - 111 mmol/L   CO2 24 22 - 32 mmol/L   Glucose, Bld 92 70 - 99 mg/dL    Comment: Glucose reference range applies only to samples taken after fasting for at least 8 hours.   BUN 48 (H) 8 - 23 mg/dL   Creatinine, Ser 4.05 (H) 0.61 - 1.24 mg/dL   Calcium 11.5 (H) 8.9 - 10.3 mg/dL   Total Protein 10.3 (H) 6.5 - 8.1 g/dL   Albumin 2.0 (L) 3.5 - 5.0 g/dL   AST 41 15 - 41 U/L   ALT 16 0 - 44 U/L   Alkaline Phosphatase 51 38 - 126 U/L   Total Bilirubin 0.7 0.3 - 1.2 mg/dL   GFR, Estimated 16 (L) >60 mL/min    Comment: (NOTE) Calculated using the CKD-EPI Creatinine Equation (2021)    Anion gap 4 (L) 5 - 15    Comment: Performed at [facility], [address], [place], [place] [address]  Magnesium     Status: Abnormal   Collection Time: 10/31/20  4:32 AM  Result Value Ref Range   Magnesium 1.5 (L) 1.7 - 2.4 mg/dL    Comment: Performed at [facility], [address] [place], [place] [address]  Troponin I (High Sensitivity)     Status: Abnormal   Collection Time: 10/31/20  8:02 AM  Result Value Ref Range   Troponin I (High Sensitivity) 3,336 (HH) <18 ng/L    Comment: CRITICAL VALUE NOTED.  VALUE IS CONSISTENT WITH PREVIOUSLY REPORTED AND CALLED VALUE. (NOTE) Elevated high sensitivity troponin I (hsTnI) values and significant  changes across serial measurements may suggest ACS but many other  chronic and acute conditions are known to elevate hsTnI results.  Refer to the Links section for chest pain algorithms and additional  guidance. Performed at [facility], [address], [place],  [address]       ROS:  Review of systems not obtained due to patient factors.  Physical Exam: Vitals:   10/31/20 0800 10/31/20 0807  BP: (!) 126/57   Pulse: 70   Resp: 17  Temp:    SpO2: 94% 95%     General: thin, confused , somnolent HEENT: PERRLA, EOMI, mucous membranes slightly dry Neck: no JVD Heart: RRR Lungs: mostly clear Abdomen: soft, non tender Extremities: no edema Skin: warm and dry Neuro: confused, not oriented  Assessment/Plan: 65 year old white male with history of hypertension.  He had a positive SPEP and slightly elevated calcium in September 2021 but refused further evaluation.  He now presents with hypercalcemia, AKI, extreme anemia and extensive lytic lesions on bony skeleton 1.Renal- AKI with creatinine of 1.23 in September, now 4.  Urinalysis showing 100 of protein but no cells.  Imaging does not reveal hydronephrosis.  Given the fact that he had a positive SPEP 7 months ago and now has all the hallmarks signs of myeloma I suspect myeloma and myeloma kidney.  Has improved slightly with IV hydration and correction of hypercalcemia.  No absolute indications for dialysis at this time.  Also, may not consider him a candidate for dialysis if is not on board with aggressive treatment of newly diagnosed issue 2.  Probable myeloma- this is a difficult situation.  If you read note from his PCP, patient clearly was not on board with preventative health care or referrals to specialists.  If cannot get someone to speak for him-we will likely need to aggressively treat until he can be more coherent to dictate his medical care.  I suspect he will need transfer to likely [facility] to deal with his newly diagnosed myeloma if aggressive care is desired 3. Hypertension/volume  -initially hypotensive.  Has improved with IV fluid hydration.  He is not wet, would continue at this time.  Could be contributing some to ATN 4.  Hypercalcemia-with lytic bone lesions likely due to myeloma.  Initial  calcium of 14 has improved with pamidronate.  Continue with IV fluids 5. Anemia  -extreme and requiring transfusion 6.  Positive troponin-could be either ischemic heart disease or demand ischemia.  Cardiology has been consulted 7.  Disposition- as above need to determine aggressive care versus comfort care.  Judging by what I see in the chart patient did not desire aggressive care.  However, if cannot make decision himself and family is not comfortable making that decision will likely need transfer to [facility] for management of newly diagnosed myeloma   Louis Meckel 10/31/2020, 9:20 AM

## 2020-10-31 NOTE — Consult Note (Addendum)
Cardiology Consultation:   Patient ID: Paul Norton MRN: [id number]; DOB: 1955-12-10  Admit date: 10/29/2020 Date of Consult: 10/31/2020  PCP:  Tysinger, David S, PA-C   [place]  Cardiologist:  New Advanced Practice Provider:  No care team member to display Electrophysiologist:  None  [id number]}    Patient Profile:   Paul Norton is a 65 y.o. male with a hx of  HTN, HL admitted with AMS who is being seen today for the evaluation of elevated troponin at the request of Dr Wynetta Emery.  History of Present Illness:   Paul Norton 65 yo male history of HTN, HL, admitted with AMS. Due to mental status unable to collect history from patient.    ER vitals p 100 bp 128/60 97% RA WBC 5.9 Hgb 3.9 (10.8 7 months ago) Plt 125 Na 127 K 3.8 BUN 47 Cr 4.5 Ca 14.2 Alb 2.5 Ammonia 13 TSH 5.2 CK 17 UDS neg COVID neg Trop 12--78-->2985-->3519-->3537-->3946-->3336 EKG SR, diffuse ST depressions and aVR elevation Echo: LVE 60-65%, no WMAs, mild RV dysfurncion, severe BAE  CT head mottled apperance of skull base, concerning for lytic lesions.  CXR mild vascular congestion with infiltrate or edema CT A/P: extensive lytic foci throughout skeleton, mural thickening sigmoid colon, splenomegaly  Past Medical History:  Diagnosis Date  . Colonoscopy refused 09/2017  . Hyperlipidemia   . Hypertension   . Refuses treatment 09/2017   refuses screenings such as cancer screening, refuses vaccines, refuses normal preventative care  . Vaccine refused by patient    all vaccines as of 09/2017    Past Surgical History:  Procedure Laterality Date  . COLONOSCOPY     never, declines as of 09/2017     Inpatient Medications: Scheduled Meds: . calcitonin  4 Units/kg Subcutaneous BID  . Chlorhexidine Gluconate Cloth  6 each Topical Daily  . cyanocobalamin  1,000 mcg Intramuscular Daily  . feeding supplement  237 mL Oral BID BM  . fluticasone furoate-vilanterol  1 puff  Inhalation Daily  . rosuvastatin  10 mg Oral QHS   Continuous Infusions: . sodium chloride 150 mL/hr at 10/31/20 0801  . magnesium sulfate bolus IVPB 4 g (10/31/20 0803)   PRN Meds: acetaminophen **OR** acetaminophen, HYDROmorphone (DILAUDID) injection, ondansetron **OR** ondansetron (ZOFRAN) IV, oxyCODONE  Allergies:   No Known Allergies  Social History:   Social History   Socioeconomic History  . Marital status: Divorced    Spouse name: Not on file  . Number of children: Not on file  . Years of education: Not on file  . Highest education level: Not on file  Occupational History  . Not on file  Tobacco Use  . Smoking status: Never Smoker  . Smokeless tobacco: Never Used  Substance and Sexual Activity  . Alcohol use: No  . Drug use: Never  . Sexual activity: Not on file  Other Topics Concern  . Not on file  Social History Narrative  . Not on file   Social Determinants of Health   Financial Resource Strain: Not on file  Food Insecurity: Not on file  Transportation Needs: Not on file  Physical Activity: Not on file  Stress: Not on file  Social Connections: Not on file  Intimate Partner Violence: Not on file    Family History:   History reviewed. No pertinent family history.   ROS:  Unable to collect due to AMS.   Physical Exam/Data:   Vitals:   10/31/20 0569 10/31/20 0734 10/31/20  0800 10/31/20 0807  BP: (!) 102/44  (!) 126/57   Pulse: 63 63 70   Resp: _0 Temp:  99 F (37.2 C)    TempSrc:  Axillary    SpO2: 97% 97% 94% 95%  Weight:      Height:        Intake/Output Summary (Last 24 hours) at 10/31/2020 0929 Last data filed at 10/31/2020 0549 Gross per 24 hour  Intake 2525.39 ml  Output 975 ml  Net 1550.39 ml   Last 3 Weights 10/31/2020 10/30/2020 10/29/2020  Weight (lbs) 156 lb 4.9 oz 151 lb 14.4 oz 155 lb 1.6 oz  Weight (kg) 70.9 kg 68.9 kg 70.353 kg     Body mass index is 23.08 kg/m.  General:  Well nourished, well developed, in no  acute distress HEENT: normal Lymph: no adenopathy Neck: no JVD Endocrine:  No thryomegaly Vascular: No carotid bruits; FA pulses 2+ bilaterally without bruits  Cardiac:  normal S1, S2; RRR; no murmur  Lungs:  clear to auscultation bilaterally, no wheezing, rhonchi or rales  Abd: soft, nontender, no hepatomegaly  Ext: no edema Musculoskeletal:  No deformities, BUE and BLE strength normal and equal Skin: warm and dry  Neuro:  CNs 2-12 intact, no focal abnormalities noted Psych:  consfused   Laboratory Data:  High Sensitivity Troponin:   Recent Labs  Lab 10/30/20 0931 10/30/20 2112 10/31/20 0229 10/31/20 0430 10/31/20 0802  TROPONINIHS 2,985* 3,519* 3,537* 3,946* 3,336*     Chemistry Recent Labs  Lab 10/29/20 2307 10/30/20 0931 10/31/20 0432  NA 127* 130* 131*  K 3.8 4.3 3.8  CL 96* 100 103  CO2 _1 GLUCOSE 129* 135* 92  BUN 47* 45* 48*  CREATININE 4.50* 3.96* 4.05*  CALCIUM 14.2* 12.8* 11.5*  GFRNONAA 14* 16* 16*  ANIONGAP 7 5 4*    Recent Labs  Lab 10/29/20 2307 10/30/20 0931 10/31/20 0432  PROT 12.4* 11.2* 10.3*  ALBUMIN 2.5* 2.2* 2.0*  AST 32 39 41  ALT _2 ALKPHOS 64 59 51  BILITOT 0.7 0.6 0.7   Hematology Recent Labs  Lab 10/29/20 2307 10/30/20 0931 10/31/20 0432  WBC 5.9 5.1 7.2  RBC 0.99*  1.02* 1.61*  1.61* 2.34*  HGB 3.9* 5.5* 7.7*  HCT 12.0* 16.8* 22.9*  MCV 121.2* 104.3* 97.9  MCH 39.4* 34.2* 32.9  MCHC 32.5 32.7 33.6  RDW 14.3 Not Measured 23.1*  PLT 125* 97* 93*   BNPNo results for input(s): BNP, PROBNP in the last 168 hours.  DDimer No results for input(s): DDIMER in the last 168 hours.   Radiology/Studies:  CT ABDOMEN PELVIS WO CONTRAST  Result Date: 10/30/2020 CLINICAL DATA:  Cancer of unknown primary, multiple lytic lesions in the calvaria noted incidentally. Dizziness, unintended weight loss. EXAM: CT CHEST, ABDOMEN AND PELVIS WITHOUT CONTRAST TECHNIQUE: Multidetector CT imaging of the chest, abdomen and  pelvis was performed following the standard protocol without IV contrast. COMPARISON:  CT head 10/29/2020, chest radiograph 10/29/2020 FINDINGS: CT CHEST FINDINGS Cardiovascular: Cardiomegaly. Coronary artery calcifications. Hypoattenuation of the cardiac blood pool compatible with anemia. The aortic root is suboptimally assessed given cardiac pulsation artifact. Atherosclerotic plaque within the normal caliber aorta. Shared origin of the brachiocephalic and left common carotid arteries. Proximal great vessels are otherwise unremarkable. Central pulmonary arteries are top-normal caliber. No large central filling defects within limitations of a nonenhanced exam. Mediastinum/Nodes: No mediastinal fluid or gas. Normal thyroid gland and thoracic inlet. No  acute abnormality of the trachea or esophagus. No worrisome mediastinal or axillary adenopathy. Hilar nodal evaluation is limited in the absence of intravenous contrast media. Lungs/Pleura: No consolidation, features of edema, pneumothorax, or effusion. No suspicious pulmonary nodules or masses. Some dependent atelectasis. Evaluation slightly limited by mild respiratory motion artifact. Musculoskeletal: Widespread extensive lytic mottling throughout the axial and appendicular skeleton. Multiple rib fractures are seen in various stages of healing many of which are presumed to be pathologic in nature. Severe degenerative changes are present in the bilateral shoulders including larger heterotopic ossifications seen in the left shoulder recess. Bridging syndesmophytes across much of the thoracic levels and partial bridging across the spinous processes, could reflect some underlying spondyloarthropathy. CT ABDOMEN PELVIS FINDINGS Hepatobiliary: No visible focal liver lesion with limitations of an unenhanced CT. Smooth surface contour. Normal hepatic attenuation. Normal gallbladder and biliary tree without visible calcified gallstone. Pancreas: Mild pancreatic atrophy. No  pancreatic ductal dilatation or surrounding inflammatory changes. Spleen: Splenomegaly within enlarged, lobular spleen. No visible focal splenic lesion within the limitations of this unenhanced exam. Adrenals/Urinary Tract: No adrenal mass or hemorrhage. Slight under rotation of the bilateral kidneys, anatomic variant. No visible or contour deforming renal lesion. No urolithiasis or hydronephrosis. Urinary bladder is largely decompressed at the time of exam and therefore poorly evaluated by CT imaging. Mild circumferential bladder wall thickening may be related underdistention or chronic outlet obstruction given marked indentation of bladder base by an enlarged prostate. Stomach/Bowel: Distal esophagus, stomach and duodenum are unremarkable. No small bowel thickening or dilatation. Normal appendix in the right lower quadrant. Extensive distal colonic diverticulosis without active acute inflammation to suggest active diverticulitis. Some mild mural thickening in the sigmoid may be reflective of prior inflammation though warrants further evaluation with outpatient colonoscopy if not recently performed. Vascular/Lymphatic: Atherosclerotic calcifications within the abdominal aorta and  vessels. No aneurysm or ectasia. No enlarged abdominopelvic lymph nodes. Reproductive: Enlarged prostate with few typically benign punctate calcifications. Trace right hydrocele. Other: No abdominopelvic free air or fluid. No bowel containing hernia. Musculoskeletal: Extensive lytic foci and bony remodeling throughout the axial and appendicular skeleton of the abdomen and pelvis including the proximal femora as well. Bony ankylosis of the L4-L5 and lumbosacral junction as well as across the bilateral SI joints. IMPRESSION: 1. Extensive lytic foci and bony mottling throughout the axial and appendicular skeleton of the chest, abdomen and pelvis including the proximal femora and humeri as well as multiple bilateral rib fractures in  various stages of healing many of which are presumed to be pathologic in nature. Findings are concerning for a diffuse osseous metastatic disease versus multiple myeloma. 2. Mild mural thickening in the sigmoid may be reflective of prior inflammation given the numerous colonic diverticula within the segment though warrants further evaluation with outpatient colonoscopy if not recently performed. 3. Splenomegaly within enlarged, lobular spleen, nonspecific but can be seen in the setting of myeloproliferative disorder. 4. Cardiomegaly and coronary artery calcifications. Hypoattenuation of the cardiac blood pool suggestive of anemia. 5. Trace right hydrocele. 6. Mild circumferential bladder wall thickening may be related underdistention or chronic outlet obstruction given marked indentation of bladder base by an enlarged prostate. Could correlate with urinalysis to exclude cystitis. 7. Prostatomegaly. 8. Aortic Atherosclerosis (ICD10-I70.0). Electronically Signed   By: Lovena Le M.D.   On: 10/30/2020 01:15   CT Head Wo Contrast  Result Date: 10/29/2020 CLINICAL DATA:  Dizziness, carbon monoxide exposure 2 months prior with COVID few weeks ago. Unintended weight loss. EXAM: CT HEAD  WITHOUT CONTRAST TECHNIQUE: Contiguous axial images were obtained from the base of the skull through the vertex without intravenous contrast. COMPARISON:  None. FINDINGS: Brain: No evidence of acute infarction, hemorrhage, hydrocephalus, extra-axial collection, visible mass lesion or mass effect. Symmetric prominence of the ventricles, cisterns and sulci compatible with parenchymal volume loss. Patchy areas of white matter hypoattenuation are most compatible with chronic microvascular angiopathy. Vascular: Atherosclerotic calcification of the carotid siphons. No hyperdense vessel. Skull: Mottled appearance of the bone particularly towards the clivus and sphenoid bones with multiple sites of lytic lesions, largest in the posterior left  parietal bone measuring 19 x 8 mm (3/42) a slightly more expansile lytic focus is also seen in the right temporal bone measuring 15 x 7 mm (3/19) and in the right mastoid measuring 11 x 12 mm (3/9). Additional scattered foci elsewhere. No calvarial fracture. No scalp swelling or hematoma. Sinuses/Orbits: Nodular mural thickening in the paranasal sinuses, predominantly in the maxillary sinuses. Mastoid air cells are predominantly clear. Middle ear cavities are clear. Other: None IMPRESSION: 1. Mottled appearance of the calvaria and skull base with multitude of lytic lesions concerning for metastatic disease or myeloma. 2. No acute intracranial abnormality is seen within the limitations of an unenhanced CT. Background of chronic microvascular angiopathy and parenchymal volume loss. These results were called by telephone at the time of interpretation on 10/29/2020 at 10:54 pm to provider [facility] ZAMMIT , who verbally acknowledged these results. Electronically Signed   By: Lovena Le M.D.   On: 10/29/2020 22:56   CT Chest Wo Contrast  Result Date: 10/30/2020 CLINICAL DATA:  Cancer of unknown primary, multiple lytic lesions in the calvaria noted incidentally. Dizziness, unintended weight loss. EXAM: CT CHEST, ABDOMEN AND PELVIS WITHOUT CONTRAST TECHNIQUE: Multidetector CT imaging of the chest, abdomen and pelvis was performed following the standard protocol without IV contrast. COMPARISON:  CT head 10/29/2020, chest radiograph 10/29/2020 FINDINGS: CT CHEST FINDINGS Cardiovascular: Cardiomegaly. Coronary artery calcifications. Hypoattenuation of the cardiac blood pool compatible with anemia. The aortic root is suboptimally assessed given cardiac pulsation artifact. Atherosclerotic plaque within the normal caliber aorta. Shared origin of the brachiocephalic and left common carotid arteries. Proximal great vessels are otherwise unremarkable. Central pulmonary arteries are top-normal caliber. No large central filling  defects within limitations of a nonenhanced exam. Mediastinum/Nodes: No mediastinal fluid or gas. Normal thyroid gland and thoracic inlet. No acute abnormality of the trachea or esophagus. No worrisome mediastinal or axillary adenopathy. Hilar nodal evaluation is limited in the absence of intravenous contrast media. Lungs/Pleura: No consolidation, features of edema, pneumothorax, or effusion. No suspicious pulmonary nodules or masses. Some dependent atelectasis. Evaluation slightly limited by mild respiratory motion artifact. Musculoskeletal: Widespread extensive lytic mottling throughout the axial and appendicular skeleton. Multiple rib fractures are seen in various stages of healing many of which are presumed to be pathologic in nature. Severe degenerative changes are present in the bilateral shoulders including larger heterotopic ossifications seen in the left shoulder recess. Bridging syndesmophytes across much of the thoracic levels and partial bridging across the spinous processes, could reflect some underlying spondyloarthropathy. CT ABDOMEN PELVIS FINDINGS Hepatobiliary: No visible focal liver lesion with limitations of an unenhanced CT. Smooth surface contour. Normal hepatic attenuation. Normal gallbladder and biliary tree without visible calcified gallstone. Pancreas: Mild pancreatic atrophy. No pancreatic ductal dilatation or surrounding inflammatory changes. Spleen: Splenomegaly within enlarged, lobular spleen. No visible focal splenic lesion within the limitations of this unenhanced exam. Adrenals/Urinary Tract: No adrenal mass or hemorrhage. Slight under rotation  of the bilateral kidneys, anatomic variant. No visible or contour deforming renal lesion. No urolithiasis or hydronephrosis. Urinary bladder is largely decompressed at the time of exam and therefore poorly evaluated by CT imaging. Mild circumferential bladder wall thickening may be related underdistention or chronic outlet obstruction given  marked indentation of bladder base by an enlarged prostate. Stomach/Bowel: Distal esophagus, stomach and duodenum are unremarkable. No small bowel thickening or dilatation. Normal appendix in the right lower quadrant. Extensive distal colonic diverticulosis without active acute inflammation to suggest active diverticulitis. Some mild mural thickening in the sigmoid may be reflective of prior inflammation though warrants further evaluation with outpatient colonoscopy if not recently performed. Vascular/Lymphatic: Atherosclerotic calcifications within the abdominal aorta and [place] vessels. No aneurysm or ectasia. No enlarged abdominopelvic lymph nodes. Reproductive: Enlarged prostate with few typically benign punctate calcifications. Trace right hydrocele. Other: No abdominopelvic free air or fluid. No bowel containing hernia. Musculoskeletal: Extensive lytic foci and bony remodeling throughout the axial and appendicular skeleton of the abdomen and pelvis including the proximal femora as well. Bony ankylosis of the L4-L5 and lumbosacral junction as well as across the bilateral SI joints. IMPRESSION: 1. Extensive lytic foci and bony mottling throughout the axial and appendicular skeleton of the chest, abdomen and pelvis including the proximal femora and humeri as well as multiple bilateral rib fractures in various stages of healing many of which are presumed to be pathologic in nature. Findings are concerning for a diffuse osseous metastatic disease versus multiple myeloma. 2. Mild mural thickening in the sigmoid may be reflective of prior inflammation given the numerous colonic diverticula within the segment though warrants further evaluation with outpatient colonoscopy if not recently performed. 3. Splenomegaly within enlarged, lobular spleen, nonspecific but can be seen in the setting of myeloproliferative disorder. 4. Cardiomegaly and coronary artery calcifications. Hypoattenuation of the cardiac blood pool  suggestive of anemia. 5. Trace right hydrocele. 6. Mild circumferential bladder wall thickening may be related underdistention or chronic outlet obstruction given marked indentation of bladder base by an enlarged prostate. Could correlate with urinalysis to exclude cystitis. 7. Prostatomegaly. 8. Aortic Atherosclerosis (ICD10-I70.0). Electronically Signed   By: Lovena Le M.D.   On: 10/30/2020 01:15   DG Chest Port 1 View  Result Date: 10/29/2020 CLINICAL DATA:  Weakness EXAM: PORTABLE CHEST 1 VIEW COMPARISON:  None. FINDINGS: Cardiac shadow is enlarged. Lungs are well aerated bilaterally. Mild vascular congestion is noted without interstitial edema. No focal infiltrate or sizable effusion is seen. Degenerative changes of the shoulder joints and thoracic spine are noted. IMPRESSION: Mild vascular congestion without acute infiltrate or edema. Electronically Signed   By: Inez Catalina M.D.   On: 10/29/2020 23:07   ECHOCARDIOGRAM COMPLETE  Result Date: 10/30/2020    ECHOCARDIOGRAM REPORT   Patient Name:   Paul CALLEROS Date of Exam: 10/30/2020 Medical Rec #:  [id number]             Height:       69.0 in Accession #:    [id number]            Weight:       151.9 lb Date of Birth:  08-Mar-1956              BSA:          1.838 m Patient Age:    25 years              BP:           129/58  mmHg Patient Gender: M                     HR:           83 bpm. Exam Location:  [facility] Procedure: 2D Echo, Cardiac Doppler and Color Doppler Indications:    Elevated Troponin  History:        Patient has no prior history of Echocardiogram examinations.                 Risk Factors:Hypertension and Dyslipidemia. COVID-19 virus                 infection, Hypercalcemia, Moderate protein-calorie malnutrition,                 AKI (acute kidney injury) ([place]).  Sonographer:    Alvino Chapel RCS Referring Phys: [place]  1. Left ventricular ejection fraction, by estimation, is 60 to 65%. The left  ventricle has normal function. The left ventricle has no regional wall motion abnormalities. Left ventricular diastolic parameters are indeterminate. Elevated left atrial pressure.  2. Right ventricular systolic function is mildly reduced. The right ventricular size is moderately enlarged. There is moderately elevated pulmonary artery systolic pressure.  3. Left atrial size was severely dilated.  4. Right atrial size was severely dilated.  5. The mitral valve is normal in structure. Mild mitral valve regurgitation. No evidence of mitral stenosis.  6. The aortic valve is tricuspid. Aortic valve regurgitation is not visualized. No aortic stenosis is present.  7. The inferior vena cava is dilated in size with <50% respiratory variability, suggesting right atrial pressure of 15 mmHg. FINDINGS  Left Ventricle: Left ventricular ejection fraction, by estimation, is 60 to 65%. The left ventricle has normal function. The left ventricle has no regional wall motion abnormalities. The left ventricular internal cavity size was normal in size. There is  no left ventricular hypertrophy. Left ventricular diastolic parameters are indeterminate. Elevated left atrial pressure. Right Ventricle: The right ventricular size is moderately enlarged. Right vetricular wall thickness was not assessed. Right ventricular systolic function is mildly reduced. There is moderately elevated pulmonary artery systolic pressure. The tricuspid regurgitant velocity is 2.99 m/s, and with an assumed right atrial pressure of 15 mmHg, the estimated right ventricular systolic pressure is 18.2 mmHg. Left Atrium: Left atrial size was severely dilated. Right Atrium: Right atrial size was severely dilated. Pericardium: There is no evidence of pericardial effusion. Mitral Valve: The mitral valve is normal in structure. Mild mitral valve regurgitation. No evidence of mitral valve stenosis. Tricuspid Valve: The tricuspid valve is normal in structure. Tricuspid valve  regurgitation is mild . No evidence of tricuspid stenosis. Aortic Valve: The aortic valve is tricuspid. Aortic valve regurgitation is not visualized. No aortic stenosis is present. Aortic valve mean gradient measures 5.8 mmHg. Aortic valve peak gradient measures 12.0 mmHg. Aortic valve area, by VTI measures 3.01  cm. Pulmonic Valve: The pulmonic valve was not well visualized. Pulmonic valve regurgitation is not visualized. No evidence of pulmonic stenosis. Aorta: The aortic root is normal in size and structure. Pulmonary Artery: Moderate pulmonary HTN, PASP is 51 mmHg. Venous: The inferior vena cava is dilated in size with less than 50% respiratory variability, suggesting right atrial pressure of 15 mmHg. IAS/Shunts: No atrial level shunt detected by color flow Doppler.  LEFT VENTRICLE PLAX 2D LVIDd:         5.20 cm  Diastology LVIDs:  3.50 cm  LV e' medial:    6.20 cm/s LV PW:         0.90 cm  LV E/e' medial:  23.2 LV IVS:        1.10 cm  LV e' lateral:   13.80 cm/s LVOT diam:     2.20 cm  LV E/e' lateral: 10.4 LV SV:         98 LV SV Index:   53 LVOT Area:     3.80 cm  RIGHT VENTRICLE RV S prime:     15.90 cm/s TAPSE (M-mode): 2.3 cm LEFT ATRIUM              Index       RIGHT ATRIUM           Index LA diam:        4.90 cm  2.67 cm/m  RA Area:     27.80 cm LA Vol (A2C):   141.0 ml 76.71 ml/m RA Volume:   96.80 ml  52.67 ml/m LA Vol (A4C):   128.0 ml 69.64 ml/m LA Biplane Vol: 138.0 ml 75.08 ml/m  AORTIC VALVE AV Area (Vmax):    2.79 cm AV Area (Vmean):   2.74 cm AV Area (VTI):     3.01 cm AV Vmax:           172.87 cm/s AV Vmean:          112.345 cm/s AV VTI:            0.324 m AV Peak Grad:      12.0 mmHg AV Mean Grad:      5.8 mmHg LVOT Vmax:         127.00 cm/s LVOT Vmean:        80.900 cm/s LVOT VTI:          0.257 m LVOT/AV VTI ratio: 0.79  AORTA Ao Root diam: 3.00 cm MITRAL VALVE                 TRICUSPID VALVE MV Area (PHT): 4.54 cm      TR Peak grad:   35.8 mmHg MV Decel Time: 167 msec       TR Vmax:        299.00 cm/s MR Peak grad:    94.9 mmHg MR Mean grad:    62.0 mmHg   SHUNTS MR Vmax:         487.00 cm/s Systemic VTI:  0.26 m MR Vmean:        371.0 cm/s  Systemic Diam: 2.20 cm MR PISA:         2.26 cm MR PISA Eff ROA: 15 mm MR PISA Radius:  0.60 cm MV E velocity: 144.00 cm/s MV A velocity: 45.90 cm/s MV E/A ratio:  3.14 Carlyle Dolly MD Electronically signed by Carlyle Dolly MD Signature Date/Time: 10/30/2020/12:07:16 PM    Final      Assessment and Plan:   1. Severe anemia - Hgb was 3.9 on admission - in presence of diffuse bony lytic lesions, renal failure, hypercalcemia being worked up for possible malingnancy potentially multiple myeoloma.    2. Elevated troponin/NSTEMI - peak trop 3946 trending down, EKG with diffuse global ischemia pattern. Echo with normal LVE.  - trop elevation in setting of anemia Hgb 3.9, renal failure Cr 4, and what appears to be potentially severely advanced multiple myeloma. Progressing thrombocytopenia - for these reasons would not be a cath candidate - for these reasons would avoid  ASA, hep gtt. Low bp's and renal failure, would stop beta blocker and not consider ACE/ARB. He essentialyl is not a candidate for any form of treatment other than statin at this time.    3. AKI - Cr 4.5 on admission   4. Hypercalcemia - per primary team  5. Diffuse bony lytic lesions -ongoing workup primary tea,  awaiting further evaluaton from oncology   6. Hypoxia - does not appear grossly volume overloaded by exam - echo with normal LVEF, indet diastolic parameters but severe BAE would suggest significant diastolic dysfunction - has been receiving multiple transfusions, aggressive IVFs. CT chest yesterday with clear lungs - recheck CXR, add BNP to labs. If elevated would start some IV diuresis   Cardiology will sign off inpatient care. If bp's stabilize would retry low dose beta blocker. If renal function normalizes could consider ARB. Beyond  that not a candidate for any additional cardiac interventions.     Risk Assessment/Risk Scores:     TIMI Risk Score for Unstable Angina or Non-ST Elevation MI:   The patient's TIMI risk score is 3, which indicates a 13% risk of all cause mortality, new or recurrent myocardial infarction or need for urgent revascularization in the next 14 days.      [facility] will sign off.     For questions or updates, please contact [place] Please consult [email or web address] for contact info under    Signed, Carlyle Dolly, MD  10/31/2020 9:29 AM

## 2020-10-31 NOTE — Progress Notes (Signed)
Patient still very agitated. Ensure given to patient. Patient spit Ensure back at this RN. Patient offered water instead. Patient refused. Patient resting with family at bedside. Restraints still applied due to removal of equipment, interference with medical care, and danger to self. Will continue attempting to discontinue restraint use.

## 2020-11-01 ENCOUNTER — Ambulatory Visit (HOSPITAL_COMMUNITY)
Admit: 2020-11-01 | Discharge: 2020-11-01 | Disposition: A | Discharge status: Home / Self Care | Payer: Commercial Managed Care - PPO | Attending: Family Medicine | Admitting: Family Medicine

## 2020-11-01 ENCOUNTER — Inpatient Hospital Stay (HOSPITAL_COMMUNITY): Payer: Commercial Managed Care - PPO

## 2020-11-01 ENCOUNTER — Encounter (HOSPITAL_COMMUNITY): Payer: Self-pay | Admitting: Family Medicine

## 2020-11-01 DIAGNOSIS — Z7189 Other specified counseling: Secondary | ICD-10-CM

## 2020-11-01 DIAGNOSIS — C9000 Multiple myeloma not having achieved remission: Secondary | ICD-10-CM | POA: Insufficient documentation

## 2020-11-01 LAB — COMPREHENSIVE METABOLIC PANEL
ALT: 15 U/L (ref 0–44)
AST: 36 U/L (ref 15–41)
Albumin: 1.9 g/dL — ABNORMAL LOW (ref 3.5–5.0)
Alkaline Phosphatase: 48 U/L (ref 38–126)
Anion gap: 5 (ref 5–15)
BUN: 49 mg/dL — ABNORMAL HIGH (ref 8–23)
CO2: 24 mmol/L (ref 22–32)
Calcium: 9.7 mg/dL (ref 8.9–10.3)
Chloride: 102 mmol/L (ref 98–111)
Creatinine, Ser: 3.66 mg/dL — ABNORMAL HIGH (ref 0.61–1.24)
GFR, Estimated: 18 mL/min — ABNORMAL LOW (ref >60)
Glucose, Bld: 73 mg/dL (ref 70–99)
Potassium: 3.3 mmol/L — ABNORMAL LOW (ref 3.5–5.1)
Sodium: 131 mmol/L — ABNORMAL LOW (ref 135–145)
Total Bilirubin: 0.9 mg/dL (ref 0.3–1.2)
Total Protein: 9.5 g/dL — ABNORMAL HIGH (ref 6.5–8.1)

## 2020-11-01 LAB — PROTEIN ELECTROPHORESIS, SERUM
A/G Ratio: 0.5 — ABNORMAL LOW (ref 0.7–1.7)
Albumin ELP: 3.7 g/dL (ref 2.9–4.4)
Alpha-1-Globulin: 0.4 g/dL (ref 0.0–0.4)
Alpha-2-Globulin: 0.7 g/dL (ref 0.4–1.0)
Beta Globulin: 1 g/dL (ref 0.7–1.3)
Gamma Globulin: 5.6 g/dL — ABNORMAL HIGH (ref 0.4–1.8)
Globulin, Total: 7.6 g/dL — ABNORMAL HIGH (ref 2.2–3.9)
M-Spike, %: 5.4 g/dL — ABNORMAL HIGH
Total Protein ELP: 11.3 g/dL — ABNORMAL HIGH (ref 6.0–8.5)

## 2020-11-01 LAB — LIPID PANEL
Cholesterol: 57 mg/dL (ref 0–200)
HDL: <10 mg/dL — ABNORMAL LOW (ref >40)
Triglycerides: 75 mg/dL (ref <150)
VLDL: 15 mg/dL (ref 0–40)

## 2020-11-01 LAB — CBC
HCT: 23.1 % — ABNORMAL LOW (ref 39.0–52.0)
Hemoglobin: 7.5 g/dL — ABNORMAL LOW (ref 13.0–17.0)
MCH: 32.3 pg (ref 26.0–34.0)
MCHC: 32.5 g/dL (ref 30.0–36.0)
MCV: 99.6 fL (ref 80.0–100.0)
Platelets: 78 10*3/uL — ABNORMAL LOW (ref 150–400)
RBC: 2.32 MIL/uL — ABNORMAL LOW (ref 4.22–5.81)
RDW: 22.3 % — ABNORMAL HIGH (ref 11.5–15.5)
WBC: 5.3 10*3/uL (ref 4.0–10.5)
nRBC: 0 % (ref 0.0–0.2)

## 2020-11-01 LAB — IMMUNOFIXATION ELECTROPHORESIS
IgA: 18 mg/dL — ABNORMAL LOW (ref 61–437)
IgG (Immunoglobin G), Serum: 7394 mg/dL — ABNORMAL HIGH (ref 603–1613)
IgM (Immunoglobulin M), Srm: 15 mg/dL — ABNORMAL LOW (ref 20–172)
Total Protein ELP: 9.9 g/dL — ABNORMAL HIGH (ref 6.0–8.5)

## 2020-11-01 LAB — MAGNESIUM: Magnesium: 1.9 mg/dL (ref 1.7–2.4)

## 2020-11-01 LAB — PTH, INTACT AND CALCIUM
Calcium, Total (PTH): 12.4 mg/dL — ABNORMAL HIGH (ref 8.6–10.2)
PTH: 6 pg/mL — ABNORMAL LOW (ref 15–65)

## 2020-11-01 LAB — KAPPA/LAMBDA LIGHT CHAINS
Kappa, lambda light chain ratio: 1030.53 — ABNORMAL HIGH (ref 0.26–1.65)
Lambda free light chains: 11 mg/L (ref 5.7–26.3)

## 2020-11-01 LAB — BETA 2 MICROGLOBULIN, SERUM: Beta-2 Microglobulin: 18.7 mg/L — ABNORMAL HIGH (ref 0.6–2.4)

## 2020-11-01 LAB — CEA: CEA: 1.1 ng/mL (ref 0.0–4.7)

## 2020-11-01 MED ORDER — DEXAMETHASONE SODIUM PHOSPHATE 4 MG/ML IJ SOLN
40.0000 mg | Freq: Once | INTRAMUSCULAR | Status: AC
  Administered 2020-11-01: 40 mg via INTRAVENOUS
  Filled 2020-11-01: qty 10

## 2020-11-01 MED ORDER — POTASSIUM CHLORIDE CRYS ER 20 MEQ PO TBCR
40.0000 meq | EXTENDED_RELEASE_TABLET | Freq: Once | ORAL | Status: AC
  Administered 2020-11-01: 40 meq via ORAL
  Filled 2020-11-01: qty 2

## 2020-11-01 MED ORDER — FENTANYL CITRATE (PF) 100 MCG/2ML IJ SOLN
INTRAMUSCULAR | Status: AC | PRN
  Administered 2020-11-01: 12.5 ug via INTRAVENOUS
  Administered 2020-11-01: 25 ug via INTRAVENOUS

## 2020-11-01 MED ORDER — LIDOCAINE-EPINEPHRINE 1 %-1:100000 IJ SOLN: 10.0000 mL | Freq: Once | INTRAMUSCULAR | Status: DC

## 2020-11-01 MED ORDER — LIDOCAINE-EPINEPHRINE 1 %-1:100000 IJ SOLN
INTRAMUSCULAR | Status: AC
  Filled 2020-11-01: qty 1

## 2020-11-01 MED ORDER — MIDAZOLAM HCL 2 MG/2ML IJ SOLN
INTRAMUSCULAR | Status: AC
  Filled 2020-11-01: qty 2

## 2020-11-01 MED ORDER — MIDAZOLAM HCL 2 MG/2ML IJ SOLN
INTRAMUSCULAR | Status: AC | PRN
  Administered 2020-11-01 (×2): 0.5 mg via INTRAVENOUS

## 2020-11-01 MED ORDER — FENTANYL CITRATE (PF) 100 MCG/2ML IJ SOLN
INTRAMUSCULAR | Status: AC
  Filled 2020-11-01: qty 2

## 2020-11-01 NOTE — Consult Note (Signed)
Consultation Note Date: 11/01/2020   Patient Name: Paul Norton  DOB: [birth year]  MRN: [id number]  Age / Sex: 65 y.o., male  PCP: Paul Lloyd Camelia Eng, PA-C Referring Physician: Murlean Iba, MD  Reason for Consultation: Establishing goals of care and Psychosocial/spiritual support  HPI/Patient Profile: 65 y.o. male  with past medical history of HTN/HLD, noncompliance with PCP admitted on 10/29/2020 with hypercalcemia.   Clinical Assessment and Goals of Care: I have reviewed medical records including EPIC notes, labs and imaging, received report from bedside nursing staff, examined the patient.  Paul Norton appears acutely/chronically ill and quite frail.  He will make an mostly keep eye contact.  Although he is alert and oriented x3, he still has periods of confusion.  Meeting at bedside with brother Paul Norton and sister-in-law Paul Norton to discuss diagnosis prognosis, [place], EOL wishes, disposition and options.  I introduced Palliative Medicine as specialized medical care for people living with serious illness. It focuses on providing relief from the symptoms and stress of a serious illness.   We discussed a brief life review of the patient.  Paul Norton lives alone in a rented house.  His nephew Paul Norton, is his closest relative who checks on him occasionally.  It is noted in the chart that Paul Norton has had a decline over the last few months.  Brother Paul Norton states that he has not seen Paul Norton in quite some time.  As far as functional and nutritional status, Paul Norton has been independent with ADLs/IADLs until recently.  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  We discussed current illness and what it means in the larger context of on-going co-morbidities.  We talked about the treatment plan including, but not limited to, hypercalcemia and the  treatment plan, biopsy today, oncology visit and likely outpatient follow-up, possible need for short-term rehab, time for improvements  Advanced directives, concepts specific to code status, were considered and discussed.  We talked about "treat the treatable but allowing natural death".  Paul Norton states that he has not considered CODE STATUS/life support.  I reassure him that I do not think he is in danger at this point, but it is important to consider what he does and does not want.  Questions and concerns were addressed.  The family was encouraged to call with questions or concerns.   Conference with attending, bedside nursing staff, transition of care team related to patient condition, needs, goals of care.   HCPOA   NEXT OF KIN -brother, Paul Norton.  Paul Norton is unmarried, has no children.  His parents are deceased.  His only sibling is brother Paul Norton.    SUMMARY OF RECOMMENDATIONS   At this point continue to treat the treatable Anticipate outpatient follow-up with oncology for treatment plan May benefit from short-term rehab   Code Status/Advance Care Planning:  Full code  Symptom Management:   Per attending, no additional needs at this time.  Palliative Prophylaxis:  Frequent Pain Assessment  Additional Recommendations (Limitations, Scope, Preferences):  Full Scope Treatment  Psycho-social/Spiritual:   Desire for further Chaplaincy support:no  Additional Recommendations: Caregiving  Support/Resources  Prognosis:   Unable to determine, based on outcomes, oncology treatment plan.  Discharge Planning: To be determined, based on outcomes.  May need short-term rehab.      Primary Diagnoses: Present on Admission: . AKI (acute kidney injury) ([place]) . Acute metabolic encephalopathy . Hyponatremia . Moderate protein-calorie malnutrition ([place]) . Dehydration . Abnormal CT of the head . Thrombocytopenia ([place]) . Hyperlipidemia . Essential hypertension, benign . DOE  (dyspnea on exertion)   I have reviewed the medical record, interviewed the patient and family, and examined the patient. The following aspects are pertinent.  Past Medical History:  Diagnosis Date  . Colonoscopy refused 09/2017  . Hyperlipidemia   . Hypertension   . Refuses treatment 09/2017   refuses screenings such as cancer screening, refuses vaccines, refuses normal preventative care  . Vaccine refused by patient    all vaccines as of 09/2017   Social History   Socioeconomic History  . Marital status: Divorced    Spouse name: Not on file  . Number of children: Not on file  . Years of education: Not on file  . Highest education level: Not on file  Occupational History  . Not on file  Tobacco Use  . Smoking status: Never Smoker  . Smokeless tobacco: Never Used  Substance and Sexual Activity  . Alcohol use: No  . Drug use: Never  . Sexual activity: Not on file  Other Topics Concern  . Not on file  Social History Narrative  . Not on file   Social Determinants of Health   Financial Resource Strain: Not on file  Food Insecurity: Not on file  Transportation Needs: Not on file  Physical Activity: Not on file  Stress: Not on file  Social Connections: Not on file   History reviewed. No pertinent family history. Scheduled Meds: . Chlorhexidine Gluconate Cloth  6 each Topical Daily  . feeding supplement  237 mL Oral BID BM  . fluticasone furoate-vilanterol  1 puff Inhalation Daily  . potassium chloride  40 mEq Oral Once  . rosuvastatin  10 mg Oral QHS   Continuous Infusions: . sodium chloride 75 mL/hr at 11/01/20 0213   PRN Meds:.acetaminophen **OR** acetaminophen, HYDROmorphone (DILAUDID) injection, ondansetron **OR** ondansetron (ZOFRAN) IV, oxyCODONE Medications Prior to Admission:  Prior to Admission medications   Medication Sig Start Date End Date Taking? Authorizing Provider  amLODipine (NORVASC) 10 MG tablet Take 1 tablet (10 mg total) by mouth daily.  03/31/20  Yes Paul Norton, Camelia Eng, PA-C  metoprolol tartrate (LOPRESSOR) 100 MG tablet Take 1 tablet (100 mg total) by mouth 2 (two) times daily. 03/31/20  Yes Paul Norton, Camelia Eng, PA-C  aspirin EC 81 MG tablet Take 1 tablet (81 mg total) by mouth daily. Patient not taking: Reported on 10/31/2020 03/31/20   Paul Norton, Camelia Eng, PA-C  azithromycin (ZITHROMAX) 250 MG tablet 2 tablets day 1, then 1 tablet days 2-4 Patient not taking: No sig reported 09/06/20   Carlena Hurl, PA-C  fluticasone furoate-vilanterol (BREO ELLIPTA) 200-25 MCG/INH AEPB Inhale 1 puff into the lungs daily. Patient not taking: Reported on 10/31/2020 09/06/20   Paul Norton, Camelia Eng, PA-C  Multiple Vitamins-Minerals (EMERGEN-C IMMUNE PLUS) PACK Take 1 tablet by mouth 2 (two) times daily. Patient not taking: Reported on 10/31/2020 09/06/20   Paul Norton, Camelia Eng, PA-C  predniSONE (DELTASONE) 10 MG  tablet 6/5/4/3/2/1 taper Patient not taking: No sig reported 09/06/20   Carlena Hurl, PA-C  promethazine-dextromethorphan (PROMETHAZINE-DM) 6.25-15 MG/5ML syrup Take 5 mLs by mouth 4 (four) times daily as needed for cough. Patient not taking: No sig reported 09/06/20   Carlena Hurl, PA-C  rosuvastatin (CRESTOR) 10 MG tablet Take 1 tablet (10 mg total) by mouth at bedtime. Patient not taking: No sig reported 03/31/20 03/31/21  Paul Norton, Camelia Eng, PA-C   No Known Allergies Review of Systems  Unable to perform ROS: Other    Physical Exam Vitals and nursing note reviewed.  Constitutional:      General: He is not in acute distress.    Appearance: He is ill-appearing.  HENT:     Head: Atraumatic.     Mouth/Throat:     Mouth: Mucous membranes are dry.  Cardiovascular:     Rate and Rhythm: Normal rate.  Pulmonary:     Effort: Pulmonary effort is normal. No respiratory distress.     Comments: Cough Skin:    General: Skin is warm and dry.  Neurological:     Mental Status: He is alert.     Comments: Oriented to person and place, able to  name month after looking at white board.  Has periods of confusion  Psychiatric:        Mood and Affect: Mood normal.        Behavior: Behavior normal.     Comments: Calm and cooperative, not fearful     Vital Signs: BP (!) 119/59   Pulse 86   Temp (!) 97.5 F (36.4 C) (Oral)   Resp (!) 29   Ht 5\' 9"  (1.753 m)   Wt 68 kg   SpO2 94%   BMI 22.14 kg/m  Pain Scale: 0-10 POSS *See Group Information*: 1-Acceptable,Awake and alert Pain Score: 4    SpO2: SpO2: 94 % O2 Device:SpO2: 94 % O2 Flow Rate: .O2 Flow Rate (L/min): 2 L/min  IO: Intake/output summary:   Intake/Output Summary (Last 24 hours) at 11/01/2020 1514 Last data filed at 11/01/2020 0500 Gross per 24 hour  Intake --  Output 4675 ml  Net -4675 ml    LBM: Last BM Date:  (patient unsure of last BM) Baseline Weight: Weight: 70.4 kg Most recent weight: Weight: 68 kg     Palliative Assessment/Data:   Flowsheet Rows   Flowsheet Row Most Recent Value  Intake Tab   Referral Department Hospitalist  Unit at Time of Referral ICU  Palliative Care Primary Diagnosis Cancer  Date Notified 10/30/20  Palliative Care Type New Palliative care  Reason for referral Clarify Goals of Care  Date of Admission 10/29/20  Date first seen by Palliative Care 10/31/20  # of days Palliative referral response time 1 Day(s)  # of days IP prior to Palliative referral 1  Clinical Assessment   Palliative Performance Scale Score 30%  Pain Max last 24 hours Not able to report  Pain Min Last 24 hours Not able to report  Dyspnea Max Last 24 Hours Not able to report  Dyspnea Min Last 24 hours Not able to report  Psychosocial & Spiritual Assessment   Palliative Care Outcomes       Time In: 1400 Time Out: 1520 Time Total: 80 minutes Greater than 50%  of this time was spent counseling and coordinating care related to the above assessment and plan.  Signed by: Drue Novel, NP   Please contact Palliative Medicine Team phone at [phone]  for questions and concerns.  For individual provider: See Shea Evans

## 2020-11-01 NOTE — Progress Notes (Signed)
Patient taken by [facility] to be transported to [facility] IR for bone marrow biopsy. Patient calm and cooperative. All vitals stable upon transfer to [facility].

## 2020-11-01 NOTE — Progress Notes (Signed)
Patient back from procedure at [facility] transferred by [facility]. Patient stable and vital signs are in within defined limits.

## 2020-11-01 NOTE — Progress Notes (Addendum)
PROGRESS NOTE   Paul Norton  ZOX:[id number] DOB: 1955-08-17 DOA: 10/29/2020 PCP: Carlena Hurl, PA-C   Chief Complaint  Patient presents with  . Altered Mental Status   Level of care: ICU  Brief Admission History:  65 y.o. male, with history of HTN, HLD, and history of noncompliance with PCP advised normal presents to the ED with a chief complaint of confusion.  He was admitted with severe hypercalcemia, multiple bony lytic lesions in the calvarium and spinal column, acute renal failure, thrombocytopenia, severe anemia with hemoglobin of 3.9, severe dehydration, abnormal EKG, and severe encephalopathy.  Assessment & Plan:   Principal Problem:   Hypercalcemia Active Problems:   Essential hypertension, benign   Noncompliance   Hyperlipidemia   DOE (dyspnea on exertion)   AKI (acute kidney injury) (HCC)   Symptomatic anemia   Acute metabolic encephalopathy   Hyponatremia   Moderate protein-calorie malnutrition (HCC)   Dehydration   Abnormal CT of the head   Thrombocytopenia (HCC)   Refuses treatment   Metastatic malignant neoplasm (HCC)  Hypercalcemia of malignancy -Patient presents with severe calcium elevation -Improving with IV fluids, IV pamidronate given on 4/16, calcitonin - Continue to monitor closely -- Hopeful that mentation will improve as calcium is corrected  Acute metabolic encephalopathy - secondary to severe hypercalcemia - Mentation should improve as calcium is corrected  Severe anemia/thrombocytopenia - concerning for underlying MDS versus multiple myeloma - Hg up to 7.7 after 4 units PRBC transfusion. - Follow CBC - No active bleeding found, stool was guaiac negative - holding all heparin for now  Acute renal failure / myeloma kidney - Pt presented with severe dehydration and active untreated multiple myeloma - continue IV fluid hydration per renal team recommendations -  Renally dose medications, appreciate nephrology consultation  -  Daily BMP testing  NSTEMI  - from demand ischemia related to very severe anemia - HS troponin peaked 3336 - Pt denies chest pain - EKG with ST depression, no ST elevation seen, reviewed with cardiologist at [facility] - see Dr. Harl Bowie consult note.   Not a candidate for heparin/aspirin at this time.  Hypomagnesemia - IV replacement given, recheck in AM.   Hyponatremia  - from poor oral intake and severe dehydration and malignancy associated SIADH - Treating with IV fluids, follow BMP.  Vitamin B12 deficiency - Started B12 injections  Question of multiple myeloma or MDS - SPEP highly suggestive with M spike - obtain 24 hour urine for UPEP, light chains - consulted heme/onc Dr Delton Coombes saw on 4/18 - Arranged for urgent bone marrow biopsy on 4/19 at [facility] - Follow up bone marrow biopsy results. Dr Raliegh Ip anticipating starting treatment soon.   Hyperglycemia  - Possibly stress induced - A1c 5.6%   Hyperlipidemia - by history, lipid panel in AM - resumed home rosuvastatin daily  Essential hypertension - holding meds due to soft BPs    DVT prophylaxis: SCD Code Status: Full  Family Communication: nephew  Telephone update 4/17, brother bedside 4/19 Disposition: TBD Status is: Inpatient  Remains inpatient appropriate because:Persistent severe electrolyte disturbances, IV treatments appropriate due to intensity of illness or inability to take PO and Inpatient level of care appropriate due to severity of illness  Dispo: The patient is from: Home              Anticipated d/c is to: TBD              Patient currently is not medically stable to  d/c.   Difficult to place patient No   Consultants:   Hem/onc   cardiology  Procedures:    Antimicrobials:    Subjective: Pt remains very confused and agitated, spitting at staff at times, safety restraints initiated.   Objective: Vitals:   11/01/20 0700 11/01/20 0716 11/01/20 0740 11/01/20 1101  BP: (!) 121/50 (!)  119/59    Pulse: 78 86    Resp: (!) 24 (!) 29    Temp:  98.3 F (36.8 C)  (!) 97.5 F (36.4 C)  TempSrc:  Oral  Oral  SpO2: 94% 96% 94%   Weight:      Height:        Intake/Output Summary (Last 24 hours) at 11/01/2020 1204 Last data filed at 11/01/2020 0500 Gross per 24 hour  Intake --  Output 4675 ml  Net -4675 ml   Filed Weights   10/30/20 0107 10/31/20 0343 11/01/20 0500  Weight: 68.9 kg 70.9 kg 68 kg   Examination:  General exam: frail thin male, appears stated age, confused, mumbling at times, fidgeting constantly. Appears calm and comfortable.   Respiratory system: Clear to auscultation. Respiratory effort normal. Cardiovascular system: normal S1 & S2 heard. No JVD, murmurs, rubs, gallops or clicks. No pedal edema. Gastrointestinal system: Abdomen is nondistended, soft and nontender. No organomegaly or masses felt. Normal bowel sounds heard. Central nervous system: Alert and confused/encephalopathic. No focal neurological deficits. Extremities: Symmetric 5 x 5 power. Skin: No rashes, lesions or ulcers.   Psychiatry: Judgement and insight appear poor. Mood & affect appear normal.   Data Reviewed: I have personally reviewed following labs and imaging studies  CBC: Recent Labs  Lab 10/29/20 2307 10/30/20 0931 10/31/20 0432 11/01/20 0410  WBC 5.9 5.1 7.2 5.3  NEUTROABS 3.3 3.6  --   --   HGB 3.9* 5.5* 7.7* 7.5*  HCT 12.0* 16.8* 22.9* 23.1*  MCV 121.2* 104.3* 97.9 99.6  PLT 125* 97* 93* 78*   Basic Metabolic Panel: Recent Labs  Lab 10/29/20 2307 10/30/20 0931 10/30/20 1741 10/31/20 0432 11/01/20 0410  NA 127* 130*  --  131* 131*  K 3.8 4.3  --  3.8 3.3*  CL 96* 100  --  103 102  CO2 24 25  --  24 24  GLUCOSE 129* 135*  --  92 73  BUN 47* 45*  --  48* 49*  CREATININE 4.50* 3.96*  --  4.05* 3.66*  CALCIUM 14.2* 12.8* 12.4* 11.5* 9.7  MG  --  1.7  --  1.5* 1.9    GFR: Estimated Creatinine Clearance: 19.9 mL/min (A) (by C-G formula based on SCr of 3.66  mg/dL (H)).  Liver Function Tests: Recent Labs  Lab 10/29/20 2307 10/30/20 0931 10/31/20 0432 11/01/20 0410  AST 32 39 41 36  ALT $Re'18 18 16 15  'eof$ ALKPHOS 64 59 51 48  BILITOT 0.7 0.6 0.7 0.9  PROT 12.4* 11.2* 10.3* 9.5*  ALBUMIN 2.5* 2.2* 2.0* 1.9*    CBG: No results for input(s): GLUCAP in the last 168 hours.  Recent Results (from the past 240 hour(s))  Resp Panel by RT-PCR (Flu A&B, Covid) Nasopharyngeal Swab     Status: None   Collection Time: 10/29/20 10:28 PM   Specimen: Nasopharyngeal Swab; Nasopharyngeal(NP) swabs in vial transport medium  Result Value Ref Range Status   SARS Coronavirus 2 by RT PCR NEGATIVE NEGATIVE Final    Comment: (NOTE) SARS-CoV-2 target nucleic acids are NOT DETECTED.  The SARS-CoV-2 RNA is generally detectable  in upper respiratory specimens during the acute phase of infection. The lowest concentration of SARS-CoV-2 viral copies this assay can detect is 138 copies/mL. A negative result does not preclude SARS-Cov-2 infection and should not be used as the sole basis for treatment or other patient management decisions. A negative result may occur with  improper specimen collection/handling, submission of specimen other than nasopharyngeal swab, presence of viral mutation(s) within the areas targeted by this assay, and inadequate number of viral copies(<138 copies/mL). A negative result must be combined with clinical observations, patient history, and epidemiological information. The expected result is Negative.  Fact Sheet for Patients:  EntrepreneurPulse.com.au  Fact Sheet for Healthcare Providers:  IncredibleEmployment.be  This test is no t yet approved or cleared by the [place] FDA and  has been authorized for detection and/or diagnosis of SARS-CoV-2 by FDA under an Emergency Use Authorization (EUA). This EUA will remain  in effect (meaning this test can be used) for the duration of the COVID-19  declaration under Section 564(b)(1) of the Act, 21 U.S.C.section 360bbb-3(b)(1), unless the authorization is terminated  or revoked sooner.       Influenza A by PCR NEGATIVE NEGATIVE Final   Influenza B by PCR NEGATIVE NEGATIVE Final    Comment: (NOTE) The Xpert Xpress SARS-CoV-2/FLU/RSV plus assay is intended as an aid in the diagnosis of influenza from Nasopharyngeal swab specimens and should not be used as a sole basis for treatment. Nasal washings and aspirates are unacceptable for Xpert Xpress SARS-CoV-2/FLU/RSV testing.  Fact Sheet for Patients: EntrepreneurPulse.com.au  Fact Sheet for Healthcare Providers: IncredibleEmployment.be  This test is not yet approved or cleared by the [place] FDA and has been authorized for detection and/or diagnosis of SARS-CoV-2 by FDA under an Emergency Use Authorization (EUA). This EUA will remain in effect (meaning this test can be used) for the duration of the COVID-19 declaration under Section 564(b)(1) of the Act, 21 U.S.C. section 360bbb-3(b)(1), unless the authorization is terminated or revoked.  Performed at [facility], [address], [place], [place] [address]   MRSA PCR Screening     Status: None   Collection Time: 10/30/20  1:25 AM   Specimen: Nasal Mucosa; Nasopharyngeal  Result Value Ref Range Status   MRSA by PCR NEGATIVE NEGATIVE Final    Comment:        The GeneXpert MRSA Assay (FDA approved for NASAL specimens only), is one component of a comprehensive MRSA colonization surveillance program. It is not intended to diagnose MRSA infection nor to guide or monitor treatment for MRSA infections. Performed at [facility], [address] [place], [place] [address]      Radiology Studies: [facility] 1 View  Result Date: 10/31/2020 CLINICAL DATA:  Altered mental status EXAM: PORTABLE CHEST 1 VIEW COMPARISON:  10/29/2020 FINDINGS: New elevation of the right hemidiaphragm  with right basilar pleuroparenchymal process. Chronic interstitial changes. No pneumothorax. Similar cardiomediastinal contours. IMPRESSION: New elevation of the right hemidiaphragm with right basilar atelectasis/consolidation and possible pleural effusion. Electronically Signed   By: Macy Mis M.D.   On: 10/31/2020 11:44   CT BONE MARROW BIOPSY  Result Date: 11/01/2020 INDICATION: 65 year old male with history of multiple myeloma. EXAM: CT-GUIDED BONE MARROW BIOPSY AND ASPIRATION MEDICATIONS: None ANESTHESIA/SEDATION: Fentanyl 37.5 mcg IV; Versed 1 mg IV Sedation Time: 16 minutes; The patient was continuously monitored during the procedure by the interventional radiology nurse under my direct supervision. COMPLICATIONS: None immediate. PROCEDURE: Informed consent was obtained from the patient following an  explanation of the procedure, risks, benefits and alternatives. The patient understands, agrees and consents for the procedure. All questions were addressed. A time out was performed prior to the initiation of the procedure. The patient was positioned prone and non-contrast localization CT was performed of the pelvis to demonstrate the iliac marrow spaces. The operative site was prepped and draped in the usual sterile fashion. Under sterile conditions and local anesthesia, a 22 gauge spinal needle was utilized for procedural planning. Next, an 11 gauge coaxial bone biopsy needle was advanced into the right iliac marrow space. Needle position was confirmed with CT imaging. Initially, a bone marrow aspiration was performed. Next, a bone marrow biopsy was obtained with the 11 gauge outer bone marrow device. The 11 gauge coaxial bone biopsy needle was re-advanced into a slightly different location within the left iliac marrow space, positioning was confirmed with CT imaging and an additional bone marrow biopsy was obtained. Samples were prepared with the cytotechnologist and deemed adequate. The needle was  removed and superficial hemostasis was obtained with manual compression. A dressing was applied. The patient tolerated the procedure well without immediate post procedural complication. IMPRESSION: Successful CT guided right iliac bone marrow aspiration and core biopsy. Ruthann Cancer, MD [facility] Radiology Electronically Signed   By: Ruthann Cancer MD   On: 11/01/2020 10:52   Scheduled Meds: . Chlorhexidine Gluconate Cloth  6 each Topical Daily  . cyanocobalamin  1,000 mcg Intramuscular Daily  . feeding supplement  237 mL Oral BID BM  . fluticasone furoate-vilanterol  1 puff Inhalation Daily  . potassium chloride  40 mEq Oral Once  . rosuvastatin  10 mg Oral QHS   Continuous Infusions: . sodium chloride 75 mL/hr at 11/01/20 0213     LOS: 2 days   Total time: 35 mins  Daijah Scrivens Wynetta Emery, MD How to contact the [facility] Attending or Consulting provider [place] or covering provider during after hours [place], for this patient?  1. Check the care team in [facility] and look for a) attending/consulting [facility] provider listed and b) the [facility] team listed 2. Log into [email or web address] and use [place]'s universal password to access. If you do not have the password, please contact the hospital operator. 3. Locate the [facility] provider you are looking for under [facility] and page to a number that you can be directly reached. 4. If you still have difficulty reaching the provider, please page the [facility] (Director on Call) for the Hospitalists listed on amion for assistance.  11/01/2020, 12:04 PM

## 2020-11-01 NOTE — Consult Note (Signed)
[facility] Oncology Progress Note  Name: Paul Norton      MRN: [id number]    Location: IC05/IC05-01  Date: 11/01/2020 Time:6:53 PM   Subjective: Interval History:Paul Norton is seen in his room today.  He is not agitated or combative today.  He is talking well and making more sense.  He had bone marrow biopsy done this morning.  He denies any pains.  Denies any tingling or numbness in extremities.  Objective: Vital signs in last 24 hours: Temp:  [97.5 F (36.4 C)-98.5 F (36.9 C)] 97.9 F (36.6 C) (04/19 1617) Pulse Rate:  [55-99] 90 (04/19 1617) Resp:  [14-29] 22 (04/19 1617) BP: (98-144)/(42-69) 136/59 (04/19 1617) SpO2:  [88 %-98 %] 90 % (04/19 1617) Weight:  [149 lb 14.6 oz (68 kg)] 149 lb 14.6 oz (68 kg) (04/19 0500)    Intake/Output from previous day: 04/18 0800 - 04/19 0759 In: -  Out: 6283 [Urine:4675]    Intake/Output this shift: Total I/O In: 2272.7 [I.V.:2272.7] Out: 900 [Urine:900]   PHYSICAL EXAM: BP (!) 136/59   Pulse 90   Temp 97.9 F (36.6 C) (Oral)   Resp (!) 22   Ht _0  (1.753 m)   Wt 149 lb 14.6 oz (68 kg)   SpO2 90%   BMI 22.14 kg/m         Neuro: Awake, alert, oriented to place and person. Chest: Bilateral air entry. CVS: S1-S2 regular rate and rhythm. Abdomen: Soft, nontender, no palpable masses. Extremities: No edema or cyanosis.                                                      Studies/Results: Results for orders placed or performed during the hospital encounter of 10/29/20 (from the past 48 hour(s))  Troponin I (High Sensitivity)     Status: Abnormal   Collection Time: 10/30/20  9:12 PM  Result Value Ref Range   Troponin I (High Sensitivity) 3,519 (HH) <18 ng/L    Comment: CRITICAL RESULT CALLED TO, READ BACK BY AND VERIFIED WITH: KINDLEY,C 2220 10/30/2020 COLEMAN,R (NOTE) Elevated high sensitivity troponin I (hsTnI) values and significant  changes across serial measurements may  suggest ACS but many other  chronic and acute conditions are known to elevate hsTnI results.  Refer to the Links section for chest pain algorithms and additional  guidance. Performed at [facility], [address] [place], [place] [address]   Troponin I (High Sensitivity)     Status: Abnormal   Collection Time: 10/31/20  2:29 AM  Result Value Ref Range   Troponin I (High Sensitivity) 3,537 (HH) <18 ng/L    Comment: CRITICAL RESULT CALLED TO, READ BACK BY AND VERIFIED WITH: KINDLY,C AT 4:55AM ON 10/31/20 BY FESTERMAN,C (NOTE) Elevated high sensitivity troponin I (hsTnI) values and significant  changes across serial measurements may suggest ACS but many other  chronic and acute conditions are known to elevate hsTnI results.  Refer to the Links section for chest pain algorithms and additional  guidance. Performed at [facility], [address], [place], [place] [address]   Troponin I (High Sensitivity)     Status: Abnormal   Collection Time: 10/31/20  4:30 AM  Result Value Ref Range   Troponin I (High Sensitivity) 3,946 (HH) <18 ng/L    Comment: CRITICAL RESULT CALLED  TO, READ BACK BY AND VERIFIED WITH: HEARN,J AT 6:00AM ON 10/31/20 BY FESTERMAN,C (NOTE) Elevated high sensitivity troponin I (hsTnI) values and significant  changes across serial measurements may suggest ACS but many other  chronic and acute conditions are known to elevate hsTnI results.  Refer to the Links section for chest pain algorithms and additional  guidance. Performed at [facility], [address], [place], [place] [address]   CBC     Status: Abnormal   Collection Time: 10/31/20  4:32 AM  Result Value Ref Range   WBC 7.2 4.0 - 10.5 K/uL   RBC 2.34 (L) 4.22 - 5.81 MIL/uL   Hemoglobin 7.7 (L) 13.0 - 17.0 g/dL    Comment: REPEATED TO VERIFY POST TRANSFUSION SPECIMEN    HCT 22.9 (L) 39.0 - 52.0 %   MCV 97.9 80.0 - 100.0 fL   MCH 32.9 26.0 - 34.0 pg   MCHC 33.6 30.0 - 36.0 g/dL   RDW 23.1 (H) 11.5 - 15.5  %   Platelets 93 (L) 150 - 400 K/uL    Comment: SPECIMEN CHECKED FOR CLOTS Immature Platelet Fraction may be clinically indicated, consider ordering this additional test [id number] CONSISTENT WITH PREVIOUS RESULT    nRBC 0.0 0.0 - 0.2 %    Comment: Performed at [facility], [address] [place], [place] [address]  Comprehensive metabolic panel     Status: Abnormal   Collection Time: 10/31/20  4:32 AM  Result Value Ref Range   Sodium 131 (L) 135 - 145 mmol/L   Potassium 3.8 3.5 - 5.1 mmol/L   Chloride 103 98 - 111 mmol/L   CO2 24 22 - 32 mmol/L   Glucose, Bld 92 70 - 99 mg/dL    Comment: Glucose reference range applies only to samples taken after fasting for at least 8 hours.   BUN 48 (H) 8 - 23 mg/dL   Creatinine, Ser 4.05 (H) 0.61 - 1.24 mg/dL   Calcium 11.5 (H) 8.9 - 10.3 mg/dL   Total Protein 10.3 (H) 6.5 - 8.1 g/dL   Albumin 2.0 (L) 3.5 - 5.0 g/dL   AST 41 15 - 41 U/L   ALT 16 0 - 44 U/L   Alkaline Phosphatase 51 38 - 126 U/L   Total Bilirubin 0.7 0.3 - 1.2 mg/dL   GFR, Estimated 16 (L) >60 mL/min    Comment: (NOTE) Calculated using the CKD-EPI Creatinine Equation (2021)    Anion gap 4 (L) 5 - 15    Comment: Performed at [facility], [address], [place], [place] [address]  Magnesium     Status: Abnormal   Collection Time: 10/31/20  4:32 AM  Result Value Ref Range   Magnesium 1.5 (L) 1.7 - 2.4 mg/dL    Comment: Performed at [facility], [address] [place], [place] [address]  Brain natriuretic peptide     Status: Abnormal   Collection Time: 10/31/20  4:32 AM  Result Value Ref Range   B Natriuretic Peptide 883.0 (H) 0.0 - 100.0 pg/mL    Comment: Performed at [facility], [address] [place], [place] [address]  Troponin I (High Sensitivity)     Status: Abnormal   Collection Time: 10/31/20  8:02 AM  Result Value Ref Range   Troponin I (High Sensitivity) 3,336 (HH) <18 ng/L    Comment: CRITICAL VALUE NOTED.  VALUE IS CONSISTENT WITH PREVIOUSLY REPORTED AND  CALLED VALUE. (NOTE) Elevated high sensitivity troponin I (hsTnI) values and significant  changes across  serial measurements may suggest ACS but many other  chronic and acute conditions are known to elevate hsTnI results.  Refer to the Links section for chest pain algorithms and additional  guidance. Performed at [facility], [address] [place], [place] [address]   Immunofixation electrophoresis     Status: Abnormal   Collection Time: 10/31/20  1:41 PM  Result Value Ref Range   Total Protein ELP 9.9 (H) 6.0 - 8.5 g/dL   IgG (Immunoglobin G), Serum 7,394 (H) 603 - 1,613 mg/dL    Comment: (NOTE) Results confirmed on dilution.    IgA 18 (L) 61 - 437 mg/dL    Comment: Result confirmed on concentration.   IgM (Immunoglobulin M), Srm 15 (L) 20 - 172 mg/dL    Comment: (NOTE) Result confirmed on concentration. Performed At: [facility] [place], [place] [id number] Rush Farmer MD YT:[id number]    Immunofixation Result, Serum Comment (A)     Comment: (NOTE) Immunofixation shows IgG monoclonal protein with kappa light chain specificity and monoclonal free kappa light chains. Please note that samples from patients receiving DARZALEX(R) (daratumumab) or SARCLISA(R)(isatuximab-irfc) treatment can appear as an "IgG kappa" and mask a complete response (CR). If this patient is receiving these therapies, this IFE assay interference can be removed by ordering test number 123218-"Immunofixation, Daratumumab-Specific, Serum" or 123062-"Immunofixation, Isatuximab-Specific, Serum" and submitting a new sample for testing or by calling the lab to add this test to the current sample.   Lactate dehydrogenase     Status: None   Collection Time: 10/31/20  1:41 PM  Result Value Ref Range   LDH 115 98 - 192 U/L    Comment: Performed at [facility], [address], [place], [place] [address]  Beta 2 microglobulin, serum     Status: Abnormal   Collection Time: 10/31/20   1:41 PM  Result Value Ref Range   Beta-2 Microglobulin 18.7 (H) 0.6 - 2.4 mg/L    Comment: (NOTE) Siemens Immulite 2000 Immunochemiluminometric assay (ICMA) Values obtained with different assay methods or kits cannot be used interchangeably. Results cannot be interpreted as absolute evidence of the presence or absence of malignant disease. Performed At: [facility] [place], [place] [id number] Rush Farmer MD [id number]   Kappa/lambda light chains     Status: Abnormal   Collection Time: 10/31/20  1:41 PM  Result Value Ref Range   Kappa free light chain Note: (A) 3.3 - 19.4 mg/L    Comment: 11335.8   Lamda free light chains 11.0 5.7 - 26.3 mg/L   Kappa, lamda light chain ratio 1,030.53 (H) 0.26 - 1.65    Comment: (NOTE) Performed At: [facility] [place], [place] [id number] Rush Farmer MD YI:[id number]   PSA     Status: None   Collection Time: 10/31/20  1:41 PM  Result Value Ref Range   Prostatic Specific Antigen 3.36 0.00 - 4.00 ng/mL    Comment: (NOTE) While PSA levels of <=4.0 ng/ml are reported as reference range, some men with levels below 4.0 ng/ml can have prostate cancer and many men with PSA above 4.0 ng/ml do not have prostate cancer.  Other tests such as free PSA, age specific reference ranges, PSA velocity and PSA doubling time may be helpful especially in men less than 19 years old. Performed at [facility], [place] [address], [place], [place] [address]   TSH     Status: None   Collection Time: 10/31/20  1:41 PM  Result Value Ref Range   TSH 4.033 0.350 - 4.500 uIU/mL    Comment: Performed by a 3rd Generation assay with a functional sensitivity of <=0.01 uIU/mL. Performed at [facility], [address], [place], [place] [address]   CBC     Status: Abnormal   Collection Time: 11/01/20  4:10 AM  Result Value Ref Range   WBC 5.3 4.0 - 10.5 K/uL   RBC 2.32 (L) 4.22 - 5.81 MIL/uL   Hemoglobin 7.5 (L)  13.0 - 17.0 g/dL   HCT 23.1 (L) 39.0 - 52.0 %   MCV 99.6 80.0 - 100.0 fL   MCH 32.3 26.0 - 34.0 pg   MCHC 32.5 30.0 - 36.0 g/dL   RDW 22.3 (H) 11.5 - 15.5 %   Platelets 78 (L) 150 - 400 K/uL    Comment: SPECIMEN CHECKED FOR CLOTS Immature Platelet Fraction may be clinically indicated, consider ordering this additional test [id number] PLATELET COUNT CONFIRMED BY SMEAR    nRBC 0.0 0.0 - 0.2 %    Comment: Performed at [facility], [address] [place], [place] [address]  Comprehensive metabolic panel     Status: Abnormal   Collection Time: 11/01/20  4:10 AM  Result Value Ref Range   Sodium 131 (L) 135 - 145 mmol/L   Potassium 3.3 (L) 3.5 - 5.1 mmol/L   Chloride 102 98 - 111 mmol/L   CO2 24 22 - 32 mmol/L   Glucose, Bld 73 70 - 99 mg/dL    Comment: Glucose reference range applies only to samples taken after fasting for at least 8 hours.   BUN 49 (H) 8 - 23 mg/dL   Creatinine, Ser 3.66 (H) 0.61 - 1.24 mg/dL   Calcium 9.7 8.9 - 10.3 mg/dL   Total Protein 9.5 (H) 6.5 - 8.1 g/dL   Albumin 1.9 (L) 3.5 - 5.0 g/dL   AST 36 15 - 41 U/L   ALT 15 0 - 44 U/L   Alkaline Phosphatase 48 38 - 126 U/L   Total Bilirubin 0.9 0.3 - 1.2 mg/dL   GFR, Estimated 18 (L) >60 mL/min    Comment: (NOTE) Calculated using the CKD-EPI Creatinine Equation (2021)    Anion gap 5 5 - 15    Comment: Performed at [facility], [address] [place], [place] [address]  Magnesium     Status: None   Collection Time: 11/01/20  4:10 AM  Result Value Ref Range   Magnesium 1.9 1.7 - 2.4 mg/dL    Comment: Performed at [facility], [address], [place], [place] [address]  Lipid panel     Status: Abnormal   Collection Time: 11/01/20  4:10 AM  Result Value Ref Range   Cholesterol 57 0 - 200 mg/dL   Triglycerides 75 <150 mg/dL   HDL <10 (L) >40 mg/dL   Total CHOL/HDL Ratio NOT CALCULATED RATIO   VLDL 15 0 - 40 mg/dL   LDL Cholesterol NOT CALCULATED 0 - 99 mg/dL    Comment: Performed at [facility], [address] [place], [place] [address]   DG Chest Port 1 View  Result Date: 10/31/2020 CLINICAL DATA:  Altered mental status EXAM: PORTABLE CHEST 1 VIEW COMPARISON:  10/29/2020 FINDINGS: New elevation of the right hemidiaphragm with right basilar pleuroparenchymal process. Chronic interstitial changes. No pneumothorax. Similar cardiomediastinal contours. IMPRESSION: New elevation of the right hemidiaphragm with right basilar atelectasis/consolidation and possible pleural effusion. Electronically Signed   By: Macy Mis M.D.   On:  10/31/2020 11:44   CT BONE MARROW BIOPSY  Result Date: 11/01/2020 INDICATION: 65 year old male with history of multiple myeloma. EXAM: CT-GUIDED BONE MARROW BIOPSY AND ASPIRATION MEDICATIONS: None ANESTHESIA/SEDATION: Fentanyl 37.5 mcg IV; Versed 1 mg IV Sedation Time: 16 minutes; The patient was continuously monitored during the procedure by the interventional radiology nurse under my direct supervision. COMPLICATIONS: None immediate. PROCEDURE: Informed consent was obtained from the patient following an explanation of the procedure, risks, benefits and alternatives. The patient understands, agrees and consents for the procedure. All questions were addressed. A time out was performed prior to the initiation of the procedure. The patient was positioned prone and non-contrast localization CT was performed of the pelvis to demonstrate the iliac marrow spaces. The operative site was prepped and draped in the usual sterile fashion. Under sterile conditions and local anesthesia, a 22 gauge spinal needle was utilized for procedural planning. Next, an 11 gauge coaxial bone biopsy needle was advanced into the right iliac marrow space. Needle position was confirmed with CT imaging. Initially, a bone marrow aspiration was performed. Next, a bone marrow biopsy was obtained with the 11 gauge outer bone marrow device. The 11 gauge coaxial bone biopsy needle was re-advanced into a slightly different  location within the left iliac marrow space, positioning was confirmed with CT imaging and an additional bone marrow biopsy was obtained. Samples were prepared with the cytotechnologist and deemed adequate. The needle was removed and superficial hemostasis was obtained with manual compression. A dressing was applied. The patient tolerated the procedure well without immediate post procedural complication. IMPRESSION: Successful CT guided right iliac bone marrow aspiration and core biopsy. Ruthann Cancer, MD [facility] Electronically Signed   By: Ruthann Cancer MD   On: 11/01/2020 10:52     MEDICATIONS: I have reviewed the patient's current medications.     Assessment/Plan:  1.  Hypercalcemia of malignancy: - Presentation with calcium 14.2, albumin 2.5.  Status post pamidronate on 10/29/2020.  Calcium level has improved to 9.7.  2.  IgG kappa multiple myeloma: - SPEP showed M spike is 5.2 g. - CTAP without contrast on 10/30/2020 showed extensive lytic foci and bone lesions throughout the axial and appendicular skeleton of the abdomen and pelvis including proximal femora. - LDH is normal.  Beta-2 microglobulin-18.7. - Kappa light chains 11,335.  Free light chain ratio is 1030. - Bone marrow biopsy was done this morning.  Specimen sent for chromosome analysis and myeloma FISH panel. - Creatinine has improved to 3.66. - Reviewed the diagnosis of multiple myeloma with the patient in detail.  Pending final pathology report, I have recommended proceeding with dexamethasone 40 mg IV today. - We will talk to pathology tomorrow.  We will consider cyclophosphamide after confirmation.  3.  Severe anemia: - Presentation with hemoglobin 3.9, MCV 121, vitamin B12 103. - Multifactorial, from multiple myeloma, renal insufficiency and B12 deficiency. - Hemoglobin today is 7.5. -Started on B12 injections.  4.  Acute kidney injury: - Highly suspicious  for myeloma kidney.  Creatinine improved to 3.66. - Further recommendations as per Dr. Moshe Cipro.  5.  Non-ST elevation MI: - Evaluated by Dr. Harl Bowie.  Ischemia from severe anemia. - Not a candidate for heparin/aspirin.  6.  Moderate thrombocytopenia: - Platelet count today 78.  Multifactorial etiology although major cause is likely multiple myeloma. - CT scan AP on 10/30/2020 showed splenomegaly with an enlarged lobular spleen which could also be contributing to thrombocytopenia.  All questions were answered.  The patient knows to call the clinic with any problems, questions or concerns. We can certainly see the patient much sooner if necessary.    Derek Jack

## 2020-11-01 NOTE — Progress Notes (Signed)
Subjective:  Pt currently at [facility] IR getting bone marrow biopsy so I did not see him physically-  I spoke to nursing-  Still confused but slightly better- no other major changes.  I see hem onc has seen- plan is for bone marrow biopsy and to initiate treatment for anything found after speaking with brother.  Made over 4.5 liters of urine- crt and calcium down   Objective Vital signs in last 24 hours: Vitals:   11/01/20 0600 11/01/20 0700 11/01/20 0716 11/01/20 0740  BP: (!) 120/49 (!) 121/50 (!) 119/59   Pulse: 78 78 86   Resp: (!) 22 (!) 24 (!) 29   Temp:   98.3 F (36.8 C)   TempSrc:   Oral   SpO2: 94% 94% 96% 94%  Weight:      Height:       Weight change: -2.9 kg  Intake/Output Summary (Last 24 hours) at 11/01/2020 9747 Last data filed at 11/01/2020 0500 Gross per 24 hour  Intake --  Output 4675 ml  Net -4675 ml    Assessment/Plan: 65 year old white male with history of hypertension.  He had a positive SPEP and slightly elevated calcium in September 2021 but refused further evaluation.  He now presents with hypercalcemia, AKI, extreme anemia and extensive lytic lesions on bony skeleton 1.Renal- AKI  On CKD with creatinine of 1.23 in September, now initially over 4.  Urinalysis showing 100 of protein but no cells.  Imaging does not reveal hydronephrosis.  Given the fact that he had a positive SPEP 7 months ago and now has all the hallmarks signs of myeloma I suspect myeloma and myeloma kidney.  AKI has improved with IV hydration and correction of hypercalcemia- non oliguric.  No absolute indications for dialysis at this time.   2.  Probable myeloma- this is a difficult situation.  If you read note from his PCP, patient clearly was not on board with preventative health care or referrals to specialists. Brother now speaking for patient, plan is to aggressively treat until he can be more coherent to dictate his medical care.   3. Hypertension/volume  -initially hypotensive.  Has  improved with IV fluid hydration.  He is not wet, would continue IVF at this time.  Volume depletion could have be contributing some to ATN 4.  Hypercalcemia-with lytic bone lesions likely due to myeloma.  Initial calcium of 14 has improved with pamidronate.  Continue with IV fluids- now under 10 5. Anemia  -extreme and requiring transfusion- hgb stable from yesterday  6.  Positive troponin-could be either ischemic heart disease or demand ischemia.  Cardiology has been consulted- no plans for aggressive work up  7.  Hyponatremia-  Hypovolemic hyponatremia-  Improved from admit with IVF    Cecille Aver    Labs: Basic Metabolic Panel: Recent Labs  Lab 10/30/20 0931 10/30/20 1741 10/31/20 0432 11/01/20 0410  NA 130*  --  131* 131*  K 4.3  --  3.8 3.3*  CL 100  --  103 102  CO2 25  --  24 24  GLUCOSE 135*  --  92 73  BUN 45*  --  48* 49*  CREATININE 3.96*  --  4.05* 3.66*  CALCIUM 12.8* 12.4* 11.5* 9.7   Liver Function Tests: Recent Labs  Lab 10/30/20 0931 10/31/20 0432 11/01/20 0410  AST 39 41 36  ALT 18 16 15   ALKPHOS 59 51 48  BILITOT 0.6 0.7 0.9  PROT 11.2* 10.3* 9.5*  ALBUMIN  2.2* 2.0* 1.9*   No results for input(s): LIPASE, AMYLASE in the last 168 hours. Recent Labs  Lab 10/29/20 2307  AMMONIA 13   CBC: Recent Labs  Lab 10/29/20 2307 10/30/20 0931 10/31/20 0432 11/01/20 0410  WBC 5.9 5.1 7.2 5.3  NEUTROABS 3.3 3.6  --   --   HGB 3.9* 5.5* 7.7* 7.5*  HCT 12.0* 16.8* 22.9* 23.1*  MCV 121.2* 104.3* 97.9 99.6  PLT 125* 97* 93* 78*   Cardiac Enzymes: Recent Labs  Lab 10/29/20 2307  CKTOTAL 17*   CBG: No results for input(s): GLUCAP in the last 168 hours.  Iron Studies:  Recent Labs    10/30/20 0931  IRON 101  TIBC 242*  FERRITIN 1,406*   Studies/Results: DG Chest Port 1 View  Result Date: 10/31/2020 CLINICAL DATA:  Altered mental status EXAM: PORTABLE CHEST 1 VIEW COMPARISON:  10/29/2020 FINDINGS: New elevation of the right  hemidiaphragm with right basilar pleuroparenchymal process. Chronic interstitial changes. No pneumothorax. Similar cardiomediastinal contours. IMPRESSION: New elevation of the right hemidiaphragm with right basilar atelectasis/consolidation and possible pleural effusion. Electronically Signed   By: Macy Mis M.D.   On: 10/31/2020 11:44   ECHOCARDIOGRAM COMPLETE  Result Date: 10/30/2020    ECHOCARDIOGRAM REPORT   Patient Name:   Paul Norton Date of Exam: 10/30/2020 Medical Rec #:  [id number]             Height:       69.0 in Accession #:    [id number]            Weight:       151.9 lb Date of Birth:  1955-12-25              BSA:          1.838 m Patient Age:    65 years              BP:           129/58 mmHg Patient Gender: M                     HR:           83 bpm. Exam Location:  [facility] Procedure: 2D Echo, Cardiac Doppler and Color Doppler Indications:    Elevated Troponin  History:        Patient has no prior history of Echocardiogram examinations.                 Risk Factors:Hypertension and Dyslipidemia. COVID-19 virus                 infection, Hypercalcemia, Moderate protein-calorie malnutrition,                 AKI (acute kidney injury) ([place]).  Sonographer:    Alvino Chapel RCS Referring Phys: [place]  1. Left ventricular ejection fraction, by estimation, is 60 to 65%. The left ventricle has normal function. The left ventricle has no regional wall motion abnormalities. Left ventricular diastolic parameters are indeterminate. Elevated left atrial pressure.  2. Right ventricular systolic function is mildly reduced. The right ventricular size is moderately enlarged. There is moderately elevated pulmonary artery systolic pressure.  3. Left atrial size was severely dilated.  4. Right atrial size was severely dilated.  5. The mitral valve is normal in structure. Mild mitral valve regurgitation. No evidence of mitral stenosis.  6. The aortic valve is tricuspid.  Aortic valve regurgitation  is not visualized. No aortic stenosis is present.  7. The inferior vena cava is dilated in size with <50% respiratory variability, suggesting right atrial pressure of 15 mmHg. FINDINGS  Left Ventricle: Left ventricular ejection fraction, by estimation, is 60 to 65%. The left ventricle has normal function. The left ventricle has no regional wall motion abnormalities. The left ventricular internal cavity size was normal in size. There is  no left ventricular hypertrophy. Left ventricular diastolic parameters are indeterminate. Elevated left atrial pressure. Right Ventricle: The right ventricular size is moderately enlarged. Right vetricular wall thickness was not assessed. Right ventricular systolic function is mildly reduced. There is moderately elevated pulmonary artery systolic pressure. The tricuspid regurgitant velocity is 2.99 m/s, and with an assumed right atrial pressure of 15 mmHg, the estimated right ventricular systolic pressure is 28.2 mmHg. Left Atrium: Left atrial size was severely dilated. Right Atrium: Right atrial size was severely dilated. Pericardium: There is no evidence of pericardial effusion. Mitral Valve: The mitral valve is normal in structure. Mild mitral valve regurgitation. No evidence of mitral valve stenosis. Tricuspid Valve: The tricuspid valve is normal in structure. Tricuspid valve regurgitation is mild . No evidence of tricuspid stenosis. Aortic Valve: The aortic valve is tricuspid. Aortic valve regurgitation is not visualized. No aortic stenosis is present. Aortic valve mean gradient measures 5.8 mmHg. Aortic valve peak gradient measures 12.0 mmHg. Aortic valve area, by VTI measures 3.01  cm. Pulmonic Valve: The pulmonic valve was not well visualized. Pulmonic valve regurgitation is not visualized. No evidence of pulmonic stenosis. Aorta: The aortic root is normal in size and structure. Pulmonary Artery: Moderate pulmonary HTN, PASP is 51 mmHg. Venous: The  inferior vena cava is dilated in size with less than 50% respiratory variability, suggesting right atrial pressure of 15 mmHg. IAS/Shunts: No atrial level shunt detected by color flow Doppler.  LEFT VENTRICLE PLAX 2D LVIDd:         5.20 cm  Diastology LVIDs:         3.50 cm  LV e' medial:    6.20 cm/s LV PW:         0.90 cm  LV E/e' medial:  23.2 LV IVS:        1.10 cm  LV e' lateral:   13.80 cm/s LVOT diam:     2.20 cm  LV E/e' lateral: 10.4 LV SV:         98 LV SV Index:   53 LVOT Area:     3.80 cm  RIGHT VENTRICLE RV S prime:     15.90 cm/s TAPSE (M-mode): 2.3 cm LEFT ATRIUM              Index       RIGHT ATRIUM           Index LA diam:        4.90 cm  2.67 cm/m  RA Area:     27.80 cm LA Vol (A2C):   141.0 ml 76.71 ml/m RA Volume:   96.80 ml  52.67 ml/m LA Vol (A4C):   128.0 ml 69.64 ml/m LA Biplane Vol: 138.0 ml 75.08 ml/m  AORTIC VALVE AV Area (Vmax):    2.79 cm AV Area (Vmean):   2.74 cm AV Area (VTI):     3.01 cm AV Vmax:           172.87 cm/s AV Vmean:          112.345 cm/s AV VTI:  0.324 m AV Peak Grad:      12.0 mmHg AV Mean Grad:      5.8 mmHg LVOT Vmax:         127.00 cm/s LVOT Vmean:        80.900 cm/s LVOT VTI:          0.257 m LVOT/AV VTI ratio: 0.79  AORTA Ao Root diam: 3.00 cm MITRAL VALVE                 TRICUSPID VALVE MV Area (PHT): 4.54 cm      TR Peak grad:   35.8 mmHg MV Decel Time: 167 msec      TR Vmax:        299.00 cm/s MR Peak grad:    94.9 mmHg MR Mean grad:    62.0 mmHg   SHUNTS MR Vmax:         487.00 cm/s Systemic VTI:  0.26 m MR Vmean:        371.0 cm/s  Systemic Diam: 2.20 cm MR PISA:         2.26 cm MR PISA Eff ROA: 15 mm MR PISA Radius:  0.60 cm MV E velocity: 144.00 cm/s MV A velocity: 45.90 cm/s MV E/A ratio:  3.14 Carlyle Dolly MD Electronically signed by Carlyle Dolly MD Signature Date/Time: 10/30/2020/12:07:16 PM    Final    Medications: Infusions: . sodium chloride 75 mL/hr at 11/01/20 0213    Scheduled Medications: . Chlorhexidine Gluconate  Cloth  6 each Topical Daily  . cyanocobalamin  1,000 mcg Intramuscular Daily  . feeding supplement  237 mL Oral BID BM  . fluticasone furoate-vilanterol  1 puff Inhalation Daily  . potassium chloride  40 mEq Oral Once  . rosuvastatin  10 mg Oral QHS    have reviewed scheduled and prn medications.  Physical Exam: Not performed today as pt not physically in the building at this time   11/01/2020,9:07 AM  LOS: 2 days

## 2020-11-01 NOTE — Progress Notes (Signed)
Chief Complaint: Patient was seen in consultation today for bone marrow biopsy  Referring Physician(s): Dr. Irwin Brakeman  Supervising Physician: Ruthann Cancer  Patient Status: [facility] - In-pt  History of Present Illness: Paul Norton is a 65 y.o. male with multiple medical issues. He is being worked up for pancytopenia and is referred to IR for bone marrow biopsy PMHx, meds, labs, imaging, allergies reviewed. Has been NPO today as directed. Talked to brother on the phone.  Past Medical History:  Diagnosis Date  . Colonoscopy refused 09/2017  . Hyperlipidemia   . Hypertension   . Refuses treatment 09/2017   refuses screenings such as cancer screening, refuses vaccines, refuses normal preventative care  . Vaccine refused by patient    all vaccines as of 09/2017    Past Surgical History:  Procedure Laterality Date  . COLONOSCOPY     never, declines as of 09/2017    Allergies: Patient has no known allergies.  Medications:  Current Facility-Administered Medications:  .  0.9 %  sodium chloride infusion, , Intravenous, Continuous, Johnson, Clanford L, MD, Last Rate: 75 mL/hr at 11/01/20 0213, New Bag at 11/01/20 0213 .  acetaminophen (TYLENOL) tablet 650 mg, 650 mg, Oral, Q6H PRN **OR** acetaminophen (TYLENOL) suppository 650 mg, 650 mg, Rectal, Q6H PRN, Zierle-Ghosh, Asia B, DO .  Chlorhexidine Gluconate Cloth 2 % PADS 6 each, 6 each, Topical, Daily, Zierle-Ghosh, Asia B, DO, 6 each at 10/31/20 0955 .  cyanocobalamin ((VITAMIN B-12)) injection 1,000 mcg, 1,000 mcg, Intramuscular, Daily, Johnson, Clanford L, MD, 1,000 mcg at 10/31/20 1049 .  feeding supplement (ENSURE ENLIVE / ENSURE PLUS) liquid 237 mL, 237 mL, Oral, BID BM, Zierle-Ghosh, Asia B, DO, 237 mL at 10/31/20 1049 .  fluticasone furoate-vilanterol (BREO ELLIPTA) 200-25 MCG/INH 1 puff, 1 puff, Inhalation, Daily, Zierle-Ghosh, Asia B, DO, 1 puff at 11/01/20 0740 .  HYDROmorphone (DILAUDID) injection 0.5  mg, 0.5 mg, Intravenous, Q4H PRN, Johnson, Clanford L, MD, 0.5 mg at 10/31/20 1212 .  ondansetron (ZOFRAN) tablet 4 mg, 4 mg, Oral, Q6H PRN **OR** ondansetron (ZOFRAN) injection 4 mg, 4 mg, Intravenous, Q6H PRN, Zierle-Ghosh, Asia B, DO, 4 mg at 10/30/20 2136 .  oxyCODONE (Oxy IR/ROXICODONE) immediate release tablet 5 mg, 5 mg, Oral, Q4H PRN, Zierle-Ghosh, Asia B, DO .  potassium chloride SA (KLOR-CON) CR tablet 40 mEq, 40 mEq, Oral, Once, Johnson, Clanford L, MD .  rosuvastatin (CRESTOR) tablet 10 mg, 10 mg, Oral, QHS, Zierle-Ghosh, Asia B, DO, 10 mg at 10/31/20 2130    History reviewed. No pertinent family history.  Social History   Socioeconomic History  . Marital status: Divorced    Spouse name: Not on file  . Number of children: Not on file  . Years of education: Not on file  . Highest education level: Not on file  Occupational History  . Not on file  Tobacco Use  . Smoking status: Never Smoker  . Smokeless tobacco: Never Used  Substance and Sexual Activity  . Alcohol use: No  . Drug use: Never  . Sexual activity: Not on file  Other Topics Concern  . Not on file  Social History Narrative  . Not on file   Social Determinants of Health   Financial Resource Strain: Not on file  Food Insecurity: Not on file  Transportation Needs: Not on file  Physical Activity: Not on file  Stress: Not on file  Social Connections: Not on file     Review of Systems: A 12 point ROS  discussed and pertinent positives are indicated in the HPI above.  All other systems are negative.  Review of Systems  Vital Signs: BP (!) 119/59   Pulse 86   Temp 98.3 F (36.8 C) (Oral)   Resp (!) 29   Ht _0  (1.753 m)   Wt 68 kg   SpO2 94%   BMI 22.14 kg/m   Physical Exam Constitutional:      Appearance: He is not ill-appearing.  HENT:     Mouth/Throat:     Mouth: Mucous membranes are moist.     Pharynx: Oropharynx is clear.  Cardiovascular:     Rate and Rhythm: Normal rate and regular  rhythm.     Heart sounds: Normal heart sounds.  Pulmonary:     Effort: Pulmonary effort is normal. No respiratory distress.     Breath sounds: Normal breath sounds.  Skin:    General: Skin is warm and dry.  Neurological:     Mental Status: He is alert.      Imaging: CT ABDOMEN PELVIS WO CONTRAST  Result Date: 10/30/2020 CLINICAL DATA:  Cancer of unknown primary, multiple lytic lesions in the calvaria noted incidentally. Dizziness, unintended weight loss. EXAM: CT CHEST, ABDOMEN AND PELVIS WITHOUT CONTRAST TECHNIQUE: Multidetector CT imaging of the chest, abdomen and pelvis was performed following the standard protocol without IV contrast. COMPARISON:  CT head 10/29/2020, chest radiograph 10/29/2020 FINDINGS: CT CHEST FINDINGS Cardiovascular: Cardiomegaly. Coronary artery calcifications. Hypoattenuation of the cardiac blood pool compatible with anemia. The aortic root is suboptimally assessed given cardiac pulsation artifact. Atherosclerotic plaque within the normal caliber aorta. Shared origin of the brachiocephalic and left common carotid arteries. Proximal great vessels are otherwise unremarkable. Central pulmonary arteries are top-normal caliber. No large central filling defects within limitations of a nonenhanced exam. Mediastinum/Nodes: No mediastinal fluid or gas. Normal thyroid gland and thoracic inlet. No acute abnormality of the trachea or esophagus. No worrisome mediastinal or axillary adenopathy. Hilar nodal evaluation is limited in the absence of intravenous contrast media. Lungs/Pleura: No consolidation, features of edema, pneumothorax, or effusion. No suspicious pulmonary nodules or masses. Some dependent atelectasis. Evaluation slightly limited by mild respiratory motion artifact. Musculoskeletal: Widespread extensive lytic mottling throughout the axial and appendicular skeleton. Multiple rib fractures are seen in various stages of healing many of which are presumed to be pathologic in  nature. Severe degenerative changes are present in the bilateral shoulders including larger heterotopic ossifications seen in the left shoulder recess. Bridging syndesmophytes across much of the thoracic levels and partial bridging across the spinous processes, could reflect some underlying spondyloarthropathy. CT ABDOMEN PELVIS FINDINGS Hepatobiliary: No visible focal liver lesion with limitations of an unenhanced CT. Smooth surface contour. Normal hepatic attenuation. Normal gallbladder and biliary tree without visible calcified gallstone. Pancreas: Mild pancreatic atrophy. No pancreatic ductal dilatation or surrounding inflammatory changes. Spleen: Splenomegaly within enlarged, lobular spleen. No visible focal splenic lesion within the limitations of this unenhanced exam. Adrenals/Urinary Tract: No adrenal mass or hemorrhage. Slight under rotation of the bilateral kidneys, anatomic variant. No visible or contour deforming renal lesion. No urolithiasis or hydronephrosis. Urinary bladder is largely decompressed at the time of exam and therefore poorly evaluated by CT imaging. Mild circumferential bladder wall thickening may be related underdistention or chronic outlet obstruction given marked indentation of bladder base by an enlarged prostate. Stomach/Bowel: Distal esophagus, stomach and duodenum are unremarkable. No small bowel thickening or dilatation. Normal appendix in the right lower quadrant. Extensive distal colonic diverticulosis without active acute  inflammation to suggest active diverticulitis. Some mild mural thickening in the sigmoid may be reflective of prior inflammation though warrants further evaluation with outpatient colonoscopy if not recently performed. Vascular/Lymphatic: Atherosclerotic calcifications within the abdominal aorta and branch vessels. No aneurysm or ectasia. No enlarged abdominopelvic lymph nodes. Reproductive: Enlarged prostate with few typically benign punctate calcifications.  Trace right hydrocele. Other: No abdominopelvic free air or fluid. No bowel containing hernia. Musculoskeletal: Extensive lytic foci and bony remodeling throughout the axial and appendicular skeleton of the abdomen and pelvis including the proximal femora as well. Bony ankylosis of the L4-L5 and lumbosacral junction as well as across the bilateral SI joints. IMPRESSION: 1. Extensive lytic foci and bony mottling throughout the axial and appendicular skeleton of the chest, abdomen and pelvis including the proximal femora and humeri as well as multiple bilateral rib fractures in various stages of healing many of which are presumed to be pathologic in nature. Findings are concerning for a diffuse osseous metastatic disease versus multiple myeloma. 2. Mild mural thickening in the sigmoid may be reflective of prior inflammation given the numerous colonic diverticula within the segment though warrants further evaluation with outpatient colonoscopy if not recently performed. 3. Splenomegaly within enlarged, lobular spleen, nonspecific but can be seen in the setting of myeloproliferative disorder. 4. Cardiomegaly and coronary artery calcifications. Hypoattenuation of the cardiac blood pool suggestive of anemia. 5. Trace right hydrocele. 6. Mild circumferential bladder wall thickening may be related underdistention or chronic outlet obstruction given marked indentation of bladder base by an enlarged prostate. Could correlate with urinalysis to exclude cystitis. 7. Prostatomegaly. 8. Aortic Atherosclerosis (ICD10-I70.0). Electronically Signed   By: Lovena Le M.D.   On: 10/30/2020 01:15   CT Head Wo Contrast  Result Date: 10/29/2020 CLINICAL DATA:  Dizziness, carbon monoxide exposure 2 months prior with COVID few weeks ago. Unintended weight loss. EXAM: CT HEAD WITHOUT CONTRAST TECHNIQUE: Contiguous axial images were obtained from the base of the skull through the vertex without intravenous contrast. COMPARISON:  None.  FINDINGS: Brain: No evidence of acute infarction, hemorrhage, hydrocephalus, extra-axial collection, visible mass lesion or mass effect. Symmetric prominence of the ventricles, cisterns and sulci compatible with parenchymal volume loss. Patchy areas of white matter hypoattenuation are most compatible with chronic microvascular angiopathy. Vascular: Atherosclerotic calcification of the carotid siphons. No hyperdense vessel. Skull: Mottled appearance of the bone particularly towards the clivus and sphenoid bones with multiple sites of lytic lesions, largest in the posterior left parietal bone measuring 19 x 8 mm (3/42) a slightly more expansile lytic focus is also seen in the right temporal bone measuring 15 x 7 mm (3/19) and in the right mastoid measuring 11 x 12 mm (3/9). Additional scattered foci elsewhere. No calvarial fracture. No scalp swelling or hematoma. Sinuses/Orbits: Nodular mural thickening in the paranasal sinuses, predominantly in the maxillary sinuses. Mastoid air cells are predominantly clear. Middle ear cavities are clear. Other: None IMPRESSION: 1. Mottled appearance of the calvaria and skull base with multitude of lytic lesions concerning for metastatic disease or myeloma. 2. No acute intracranial abnormality is seen within the limitations of an unenhanced CT. Background of chronic microvascular angiopathy and parenchymal volume loss. These results were called by telephone at the time of interpretation on 10/29/2020 at 10:54 pm to provider [facility] ZAMMIT , who verbally acknowledged these results. Electronically Signed   By: Lovena Le M.D.   On: 10/29/2020 22:56   CT Chest Wo Contrast  Result Date: 10/30/2020 CLINICAL DATA:  Cancer of unknown  primary, multiple lytic lesions in the calvaria noted incidentally. Dizziness, unintended weight loss. EXAM: CT CHEST, ABDOMEN AND PELVIS WITHOUT CONTRAST TECHNIQUE: Multidetector CT imaging of the chest, abdomen and pelvis was performed following the  standard protocol without IV contrast. COMPARISON:  CT head 10/29/2020, chest radiograph 10/29/2020 FINDINGS: CT CHEST FINDINGS Cardiovascular: Cardiomegaly. Coronary artery calcifications. Hypoattenuation of the cardiac blood pool compatible with anemia. The aortic root is suboptimally assessed given cardiac pulsation artifact. Atherosclerotic plaque within the normal caliber aorta. Shared origin of the brachiocephalic and left common carotid arteries. Proximal great vessels are otherwise unremarkable. Central pulmonary arteries are top-normal caliber. No large central filling defects within limitations of a nonenhanced exam. Mediastinum/Nodes: No mediastinal fluid or gas. Normal thyroid gland and thoracic inlet. No acute abnormality of the trachea or esophagus. No worrisome mediastinal or axillary adenopathy. Hilar nodal evaluation is limited in the absence of intravenous contrast media. Lungs/Pleura: No consolidation, features of edema, pneumothorax, or effusion. No suspicious pulmonary nodules or masses. Some dependent atelectasis. Evaluation slightly limited by mild respiratory motion artifact. Musculoskeletal: Widespread extensive lytic mottling throughout the axial and appendicular skeleton. Multiple rib fractures are seen in various stages of healing many of which are presumed to be pathologic in nature. Severe degenerative changes are present in the bilateral shoulders including larger heterotopic ossifications seen in the left shoulder recess. Bridging syndesmophytes across much of the thoracic levels and partial bridging across the spinous processes, could reflect some underlying spondyloarthropathy. CT ABDOMEN PELVIS FINDINGS Hepatobiliary: No visible focal liver lesion with limitations of an unenhanced CT. Smooth surface contour. Normal hepatic attenuation. Normal gallbladder and biliary tree without visible calcified gallstone. Pancreas: Mild pancreatic atrophy. No pancreatic ductal dilatation or  surrounding inflammatory changes. Spleen: Splenomegaly within enlarged, lobular spleen. No visible focal splenic lesion within the limitations of this unenhanced exam. Adrenals/Urinary Tract: No adrenal mass or hemorrhage. Slight under rotation of the bilateral kidneys, anatomic variant. No visible or contour deforming renal lesion. No urolithiasis or hydronephrosis. Urinary bladder is largely decompressed at the time of exam and therefore poorly evaluated by CT imaging. Mild circumferential bladder wall thickening may be related underdistention or chronic outlet obstruction given marked indentation of bladder base by an enlarged prostate. Stomach/Bowel: Distal esophagus, stomach and duodenum are unremarkable. No small bowel thickening or dilatation. Normal appendix in the right lower quadrant. Extensive distal colonic diverticulosis without active acute inflammation to suggest active diverticulitis. Some mild mural thickening in the sigmoid may be reflective of prior inflammation though warrants further evaluation with outpatient colonoscopy if not recently performed. Vascular/Lymphatic: Atherosclerotic calcifications within the abdominal aorta and branch vessels. No aneurysm or ectasia. No enlarged abdominopelvic lymph nodes. Reproductive: Enlarged prostate with few typically benign punctate calcifications. Trace right hydrocele. Other: No abdominopelvic free air or fluid. No bowel containing hernia. Musculoskeletal: Extensive lytic foci and bony remodeling throughout the axial and appendicular skeleton of the abdomen and pelvis including the proximal femora as well. Bony ankylosis of the L4-L5 and lumbosacral junction as well as across the bilateral SI joints. IMPRESSION: 1. Extensive lytic foci and bony mottling throughout the axial and appendicular skeleton of the chest, abdomen and pelvis including the proximal femora and humeri as well as multiple bilateral rib fractures in various stages of healing many of  which are presumed to be pathologic in nature. Findings are concerning for a diffuse osseous metastatic disease versus multiple myeloma. 2. Mild mural thickening in the sigmoid may be reflective of prior inflammation given the numerous colonic diverticula within the  segment though warrants further evaluation with outpatient colonoscopy if not recently performed. 3. Splenomegaly within enlarged, lobular spleen, nonspecific but can be seen in the setting of myeloproliferative disorder. 4. Cardiomegaly and coronary artery calcifications. Hypoattenuation of the cardiac blood pool suggestive of anemia. 5. Trace right hydrocele. 6. Mild circumferential bladder wall thickening may be related underdistention or chronic outlet obstruction given marked indentation of bladder base by an enlarged prostate. Could correlate with urinalysis to exclude cystitis. 7. Prostatomegaly. 8. Aortic Atherosclerosis (ICD10-I70.0). Electronically Signed   By: Lovena Le M.D.   On: 10/30/2020 01:15   DG Chest Port 1 View  Result Date: 10/31/2020 CLINICAL DATA:  Altered mental status EXAM: PORTABLE CHEST 1 VIEW COMPARISON:  10/29/2020 FINDINGS: New elevation of the right hemidiaphragm with right basilar pleuroparenchymal process. Chronic interstitial changes. No pneumothorax. Similar cardiomediastinal contours. IMPRESSION: New elevation of the right hemidiaphragm with right basilar atelectasis/consolidation and possible pleural effusion. Electronically Signed   By: Macy Mis M.D.   On: 10/31/2020 11:44   DG Chest Port 1 View  Result Date: 10/29/2020 CLINICAL DATA:  Weakness EXAM: PORTABLE CHEST 1 VIEW COMPARISON:  None. FINDINGS: Cardiac shadow is enlarged. Lungs are well aerated bilaterally. Mild vascular congestion is noted without interstitial edema. No focal infiltrate or sizable effusion is seen. Degenerative changes of the shoulder joints and thoracic spine are noted. IMPRESSION: Mild vascular congestion without acute  infiltrate or edema. Electronically Signed   By: Inez Catalina M.D.   On: 10/29/2020 23:07   ECHOCARDIOGRAM COMPLETE  Result Date: 10/30/2020    ECHOCARDIOGRAM REPORT   Patient Name:   EMIDIO WARRELL Date of Exam: 10/30/2020 Medical Rec #:  [id number]             Height:       69.0 in Accession #:    [id number]            Weight:       151.9 lb Date of Birth:  1956/02/22              BSA:          1.838 m Patient Age:    78 years              BP:           129/58 mmHg Patient Gender: M                     HR:           83 bpm. Exam Location:  [facility] Procedure: 2D Echo, Cardiac Doppler and Color Doppler Indications:    Elevated Troponin  History:        Patient has no prior history of Echocardiogram examinations.                 Risk Factors:Hypertension and Dyslipidemia. COVID-19 virus                 infection, Hypercalcemia, Moderate protein-calorie malnutrition,                 AKI (acute kidney injury) ([place]).  Sonographer:    Alvino Chapel RCS Referring Phys: [place]  1. Left ventricular ejection fraction, by estimation, is 60 to 65%. The left ventricle has normal function. The left ventricle has no regional wall motion abnormalities. Left ventricular diastolic parameters are indeterminate. Elevated left atrial pressure.  2. Right ventricular systolic function is mildly reduced. The right ventricular size is  moderately enlarged. There is moderately elevated pulmonary artery systolic pressure.  3. Left atrial size was severely dilated.  4. Right atrial size was severely dilated.  5. The mitral valve is normal in structure. Mild mitral valve regurgitation. No evidence of mitral stenosis.  6. The aortic valve is tricuspid. Aortic valve regurgitation is not visualized. No aortic stenosis is present.  7. The inferior vena cava is dilated in size with <50% respiratory variability, suggesting right atrial pressure of 15 mmHg. FINDINGS  Left Ventricle: Left ventricular ejection  fraction, by estimation, is 60 to 65%. The left ventricle has normal function. The left ventricle has no regional wall motion abnormalities. The left ventricular internal cavity size was normal in size. There is  no left ventricular hypertrophy. Left ventricular diastolic parameters are indeterminate. Elevated left atrial pressure. Right Ventricle: The right ventricular size is moderately enlarged. Right vetricular wall thickness was not assessed. Right ventricular systolic function is mildly reduced. There is moderately elevated pulmonary artery systolic pressure. The tricuspid regurgitant velocity is 2.99 m/s, and with an assumed right atrial pressure of 15 mmHg, the estimated right ventricular systolic pressure is 60.7 mmHg. Left Atrium: Left atrial size was severely dilated. Right Atrium: Right atrial size was severely dilated. Pericardium: There is no evidence of pericardial effusion. Mitral Valve: The mitral valve is normal in structure. Mild mitral valve regurgitation. No evidence of mitral valve stenosis. Tricuspid Valve: The tricuspid valve is normal in structure. Tricuspid valve regurgitation is mild . No evidence of tricuspid stenosis. Aortic Valve: The aortic valve is tricuspid. Aortic valve regurgitation is not visualized. No aortic stenosis is present. Aortic valve mean gradient measures 5.8 mmHg. Aortic valve peak gradient measures 12.0 mmHg. Aortic valve area, by VTI measures 3.01  cm. Pulmonic Valve: The pulmonic valve was not well visualized. Pulmonic valve regurgitation is not visualized. No evidence of pulmonic stenosis. Aorta: The aortic root is normal in size and structure. Pulmonary Artery: Moderate pulmonary HTN, PASP is 51 mmHg. Venous: The inferior vena cava is dilated in size with less than 50% respiratory variability, suggesting right atrial pressure of 15 mmHg. IAS/Shunts: No atrial level shunt detected by color flow Doppler.  LEFT VENTRICLE PLAX 2D LVIDd:         5.20 cm  Diastology  LVIDs:         3.50 cm  LV e' medial:    6.20 cm/s LV PW:         0.90 cm  LV E/e' medial:  23.2 LV IVS:        1.10 cm  LV e' lateral:   13.80 cm/s LVOT diam:     2.20 cm  LV E/e' lateral: 10.4 LV SV:         98 LV SV Index:   53 LVOT Area:     3.80 cm  RIGHT VENTRICLE RV S prime:     15.90 cm/s TAPSE (M-mode): 2.3 cm LEFT ATRIUM              Index       RIGHT ATRIUM           Index LA diam:        4.90 cm  2.67 cm/m  RA Area:     27.80 cm LA Vol (A2C):   141.0 ml 76.71 ml/m RA Volume:   96.80 ml  52.67 ml/m LA Vol (A4C):   128.0 ml 69.64 ml/m LA Biplane Vol: 138.0 ml 75.08 ml/m  AORTIC VALVE AV Area (Vmax):  2.79 cm AV Area (Vmean):   2.74 cm AV Area (VTI):     3.01 cm AV Vmax:           172.87 cm/s AV Vmean:          112.345 cm/s AV VTI:            0.324 m AV Peak Grad:      12.0 mmHg AV Mean Grad:      5.8 mmHg LVOT Vmax:         127.00 cm/s LVOT Vmean:        80.900 cm/s LVOT VTI:          0.257 m LVOT/AV VTI ratio: 0.79  AORTA Ao Root diam: 3.00 cm MITRAL VALVE                 TRICUSPID VALVE MV Area (PHT): 4.54 cm      TR Peak grad:   35.8 mmHg MV Decel Time: 167 msec      TR Vmax:        299.00 cm/s MR Peak grad:    94.9 mmHg MR Mean grad:    62.0 mmHg   SHUNTS MR Vmax:         487.00 cm/s Systemic VTI:  0.26 m MR Vmean:        371.0 cm/s  Systemic Diam: 2.20 cm MR PISA:         2.26 cm MR PISA Eff ROA: 15 mm MR PISA Radius:  0.60 cm MV E velocity: 144.00 cm/s MV A velocity: 45.90 cm/s MV E/A ratio:  3.14 Carlyle Dolly MD Electronically signed by Carlyle Dolly MD Signature Date/Time: 10/30/2020/12:07:16 PM    Final     Labs:  CBC: Recent Labs    10/29/20 2307 10/30/20 0931 10/31/20 0432 11/01/20 0410  WBC 5.9 5.1 7.2 5.3  HGB 3.9* 5.5* 7.7* 7.5*  HCT 12.0* 16.8* 22.9* 23.1*  PLT 125* 97* 93* 78*    COAGS: No results for input(s): INR, APTT in the last 8760 hours.  BMP: Recent Labs    03/31/20 1305 10/29/20 2307 10/30/20 0931 10/30/20 1741 10/31/20 0432  11/01/20 0410  NA 138 127* 130*  --  131* 131*  K 4.5 3.8 4.3  --  3.8 3.3*  CL 103 96* 100  --  103 102  CO2 _0 --  24 24  GLUCOSE 131* 129* 135*  --  92 73  BUN 19 47* 45*  --  48* 49*  CALCIUM 10.1 14.2* 12.8* 12.4* 11.5* 9.7  CREATININE 1.23 4.50* 3.96*  --  4.05* 3.66*  GFRNONAA 62 14* 16*  --  16* 18*  GFRAA 72  --   --   --   --   --     LIVER FUNCTION TESTS: Recent Labs    10/29/20 2307 10/30/20 0931 10/31/20 0432 11/01/20 0410  BILITOT 0.7 0.6 0.7 0.9  AST 32 39 41 36  ALT _1 ALKPHOS 64 59 51 48  PROT 12.4* 11.2* 10.3* 9.5*  ALBUMIN 2.5* 2.2* 2.0* 1.9*    TUMOR MARKERS: No results for input(s): AFPTM, CEA, CA199, CHROMGRNA in the last 8760 hours.  Assessment and Plan: Pancytopenia For CT guided bone marrow biopsy Labs reviewed. Risks and benefits of bone marrow bx was discussed with the patient and/or patient's family including, but not limited to bleeding, infection, damage to adjacent structures or low yield requiring additional tests.  All of the  questions were answered and there is agreement to proceed.  Consent signed and in chart.   Thank you for this interesting consult.  I greatly enjoyed meeting Paul Norton and look forward to participating in their care.  A copy of this report was sent to the requesting provider on this date.  Electronically Signed: Ascencion Dike, PA-C 11/01/2020, 9:09 AM   I spent a total of 20 minutes in face to face in clinical consultation, greater than 50% of which was counseling/coordinating care for bone marrow bx

## 2020-11-01 NOTE — Procedures (Signed)
Interventional Radiology Procedure Note  Procedure: CT guided aspirate and core biopsy of right iliac bone  Complications: None  Recommendations: - Bedrest supine x 1 hrs - Follow biopsy results   Anapaula Severt, MD   

## 2020-11-02 ENCOUNTER — Inpatient Hospital Stay (HOSPITAL_COMMUNITY): Payer: Commercial Managed Care - PPO

## 2020-11-02 VITALS — BP 126/65 | HR 101 | Temp 97.2°F | Resp 20

## 2020-11-02 DIAGNOSIS — C9000 Multiple myeloma not having achieved remission: Secondary | ICD-10-CM

## 2020-11-02 DIAGNOSIS — N179 Acute kidney failure, unspecified: Secondary | ICD-10-CM

## 2020-11-02 LAB — CBC
HCT: 21.3 % — ABNORMAL LOW (ref 39.0–52.0)
Hemoglobin: 7 g/dL — ABNORMAL LOW (ref 13.0–17.0)
MCH: 32.3 pg (ref 26.0–34.0)
MCHC: 32.9 g/dL (ref 30.0–36.0)
MCV: 98.2 fL (ref 80.0–100.0)
Platelets: 71 10*3/uL — ABNORMAL LOW (ref 150–400)
RBC: 2.17 MIL/uL — ABNORMAL LOW (ref 4.22–5.81)
RDW: 21.5 % — ABNORMAL HIGH (ref 11.5–15.5)
WBC: 4.5 10*3/uL (ref 4.0–10.5)
nRBC: 0 % (ref 0.0–0.2)

## 2020-11-02 LAB — UPEP/UIFE/LIGHT CHAINS/TP, 24-HR UR
% BETA, Urine: 79 %
ALPHA 1 URINE: 1.1 %
Albumin, U: 3 %
Alpha 2, Urine: 7.7 %
Free Kappa Lt Chains,Ur: 9558.21 mg/L — ABNORMAL HIGH (ref 1.17–86.46)
Free Kappa/Lambda Ratio: 1827.57 — ABNORMAL HIGH (ref 1.83–14.26)
Free Lambda Lt Chains,Ur: 5.23 mg/L (ref 0.27–15.21)
GAMMA GLOBULIN URINE: 9.3 %
M-SPIKE %, Urine: 73 % — ABNORMAL HIGH
M-Spike, Mg/24 Hr: 2695 mg/24 hr — ABNORMAL HIGH
Total Protein, Urine-Ur/day: 3692 mg/24 hr — ABNORMAL HIGH (ref 30–150)
Total Protein, Urine: 142 mg/dL
Total Volume: 2600

## 2020-11-02 LAB — COMPREHENSIVE METABOLIC PANEL
ALT: 15 U/L (ref 0–44)
AST: 32 U/L (ref 15–41)
Albumin: 1.7 g/dL — ABNORMAL LOW (ref 3.5–5.0)
Alkaline Phosphatase: 45 U/L (ref 38–126)
Anion gap: 5 (ref 5–15)
BUN: 54 mg/dL — ABNORMAL HIGH (ref 8–23)
CO2: 22 mmol/L (ref 22–32)
Calcium: 8.3 mg/dL — ABNORMAL LOW (ref 8.9–10.3)
Chloride: 103 mmol/L (ref 98–111)
Creatinine, Ser: 3.04 mg/dL — ABNORMAL HIGH (ref 0.61–1.24)
GFR, Estimated: 22 mL/min — ABNORMAL LOW (ref >60)
Glucose, Bld: 107 mg/dL — ABNORMAL HIGH (ref 70–99)
Potassium: 3.5 mmol/L (ref 3.5–5.1)
Sodium: 130 mmol/L — ABNORMAL LOW (ref 135–145)
Total Bilirubin: 1.1 mg/dL (ref 0.3–1.2)
Total Protein: 8.8 g/dL — ABNORMAL HIGH (ref 6.5–8.1)

## 2020-11-02 LAB — MAGNESIUM: Magnesium: 1.6 mg/dL — ABNORMAL LOW (ref 1.7–2.4)

## 2020-11-02 MED ORDER — DEXAMETHASONE SODIUM PHOSPHATE 100 MG/10ML IJ SOLN
10.0000 mg | Freq: Once | INTRAMUSCULAR | Status: AC
  Administered 2020-11-02: 10 mg via INTRAVENOUS
  Filled 2020-11-02: qty 10

## 2020-11-02 MED ORDER — PALONOSETRON HCL INJECTION 0.25 MG/5ML
0.2500 mg | Freq: Once | INTRAVENOUS | Status: AC
  Administered 2020-11-02: 0.25 mg via INTRAVENOUS

## 2020-11-02 MED ORDER — PALONOSETRON HCL INJECTION 0.25 MG/5ML
INTRAVENOUS | Status: AC
  Filled 2020-11-02: qty 5

## 2020-11-02 MED ORDER — SODIUM CHLORIDE 0.9 % IV SOLN: Freq: Once | INTRAVENOUS | Status: AC

## 2020-11-02 MED ORDER — CYCLOPHOSPHAMIDE CHEMO INJECTION 1 GM
200.0000 mg/m2 | Freq: Once | INTRAMUSCULAR | Status: AC
  Administered 2020-11-02: 380 mg via INTRAVENOUS
  Filled 2020-11-02: qty 19

## 2020-11-02 NOTE — Consult Note (Signed)
[facility] Oncology Progress Note  Name: Paul Norton      MRN: [id number]    Location: IC05/IC05-01  Date: 11/02/2020 Time:6:10 PM   Subjective: Interval History:Paul Norton is seen this morning.  He is awake, alert and more coherent.  Denies any new onset pains.  Objective: Vital signs in last 24 hours: Temp:  [97.2 F (36.2 C)-98.4 F (36.9 C)] 98.2 F (36.8 C) (04/20 1648) Pulse Rate:  [62-101] 95 (04/20 1000) Resp:  [12-24] 21 (04/20 1000) BP: (111-131)/(46-65) 126/65 (04/20 0951) SpO2:  [88 %-98 %] 93 % (04/20 1000) Weight:  [160 lb 0.9 oz (72.6 kg)] 160 lb 0.9 oz (72.6 kg) (04/20 0500)    Intake/Output from previous day: 04/19 0800 - 04/20 0759 In: 3207.6 [I.V.:3207.6] Out: 2900 [Urine:2900]    Intake/Output this shift: Total I/O In: 430.5 [I.V.:110.5; IV Piggyback:320] Out: 800 [Urine:800]   PHYSICAL EXAM: BP (!) 115/53   Pulse 95   Temp 98.2 F (36.8 C) (Oral)   Resp (!) 21   Ht _0  (1.753 m)   Wt 160 lb 0.9 oz (72.6 kg)   SpO2 93%   BMI 23.64 kg/m  General appearance: alert, cooperative and appears stated age Lungs: clear to auscultation bilaterally Heart: regular rate and rhythm Abdomen: soft, non-tender; bowel sounds normal; no masses,  no organomegaly Extremities: No edema or cyanosis. Skin: Skin color, texture, turgor normal. No rashes or lesions Neurologic: Grossly normal   Studies/Results: Results for orders placed or performed during the hospital encounter of 10/29/20 (from the past 48 hour(s))  CBC     Status: Abnormal   Collection Time: 11/01/20  4:10 AM  Result Value Ref Range   WBC 5.3 4.0 - 10.5 K/uL   RBC 2.32 (L) 4.22 - 5.81 MIL/uL   Hemoglobin 7.5 (L) 13.0 - 17.0 g/dL   HCT 23.1 (L) 39.0 - 52.0 %   MCV 99.6 80.0 - 100.0 fL   MCH 32.3 26.0 - 34.0 pg   MCHC 32.5 30.0 - 36.0 g/dL   RDW 22.3 (H) 11.5 - 15.5 %   Platelets 78 (L) 150 - 400 K/uL    Comment: SPECIMEN CHECKED FOR CLOTS Immature Platelet  Fraction may be clinically indicated, consider ordering this additional test [id number] PLATELET COUNT CONFIRMED BY SMEAR    nRBC 0.0 0.0 - 0.2 %    Comment: Performed at [facility], [address] [place], [place] [address]  Comprehensive metabolic panel     Status: Abnormal   Collection Time: 11/01/20  4:10 AM  Result Value Ref Range   Sodium 131 (L) 135 - 145 mmol/L   Potassium 3.3 (L) 3.5 - 5.1 mmol/L   Chloride 102 98 - 111 mmol/L   CO2 24 22 - 32 mmol/L   Glucose, Bld 73 70 - 99 mg/dL    Comment: Glucose reference range applies only to samples taken after fasting for at least 8 hours.   BUN 49 (H) 8 - 23 mg/dL   Creatinine, Ser 3.66 (H) 0.61 - 1.24 mg/dL   Calcium 9.7 8.9 - 10.3 mg/dL   Total Protein 9.5 (H) 6.5 - 8.1 g/dL   Albumin 1.9 (L) 3.5 - 5.0 g/dL   AST 36 15 - 41 U/L   ALT 15 0 - 44 U/L   Alkaline Phosphatase 48 38 - 126 U/L   Total Bilirubin 0.9 0.3 - 1.2 mg/dL   GFR, Estimated 18 (L) >60 mL/min    Comment: (NOTE) Calculated using the  CKD-EPI Creatinine Equation (2021)    Anion gap 5 5 - 15    Comment: Performed at [facility], [address] [place], [place] [address]  Magnesium     Status: None   Collection Time: 11/01/20  4:10 AM  Result Value Ref Range   Magnesium 1.9 1.7 - 2.4 mg/dL    Comment: Performed at [facility], [address] [place], [place] [address]  Lipid panel     Status: Abnormal   Collection Time: 11/01/20  4:10 AM  Result Value Ref Range   Cholesterol 57 0 - 200 mg/dL   Triglycerides 75 <150 mg/dL   HDL <10 (L) >40 mg/dL   Total CHOL/HDL Ratio NOT CALCULATED RATIO   VLDL 15 0 - 40 mg/dL   LDL Cholesterol NOT CALCULATED 0 - 99 mg/dL    Comment: Performed at [facility], [address], [place], [place] [address]  CBC     Status: Abnormal   Collection Time: 11/02/20  4:06 AM  Result Value Ref Range   WBC 4.5 4.0 - 10.5 K/uL   RBC 2.17 (L) 4.22 - 5.81 MIL/uL   Hemoglobin 7.0 (L) 13.0 - 17.0 g/dL   HCT 21.3 (L) 39.0 - 52.0 %    MCV 98.2 80.0 - 100.0 fL   MCH 32.3 26.0 - 34.0 pg   MCHC 32.9 30.0 - 36.0 g/dL   RDW 21.5 (H) 11.5 - 15.5 %   Platelets 71 (L) 150 - 400 K/uL    Comment: SPECIMEN CHECKED FOR CLOTS Immature Platelet Fraction may be clinically indicated, consider ordering this additional test [id number] CONSISTENT WITH PREVIOUS RESULT    nRBC 0.0 0.0 - 0.2 %    Comment: Performed at [facility], [address] [place], [place] [address]  Comprehensive metabolic panel     Status: Abnormal   Collection Time: 11/02/20  4:06 AM  Result Value Ref Range   Sodium 130 (L) 135 - 145 mmol/L   Potassium 3.5 3.5 - 5.1 mmol/L   Chloride 103 98 - 111 mmol/L   CO2 22 22 - 32 mmol/L   Glucose, Bld 107 (H) 70 - 99 mg/dL    Comment: Glucose reference range applies only to samples taken after fasting for at least 8 hours.   BUN 54 (H) 8 - 23 mg/dL   Creatinine, Ser 3.04 (H) 0.61 - 1.24 mg/dL   Calcium 8.3 (L) 8.9 - 10.3 mg/dL   Total Protein 8.8 (H) 6.5 - 8.1 g/dL   Albumin 1.7 (L) 3.5 - 5.0 g/dL   AST 32 15 - 41 U/L   ALT 15 0 - 44 U/L   Alkaline Phosphatase 45 38 - 126 U/L   Total Bilirubin 1.1 0.3 - 1.2 mg/dL   GFR, Estimated 22 (L) >60 mL/min    Comment: (NOTE) Calculated using the CKD-EPI Creatinine Equation (2021)    Anion gap 5 5 - 15    Comment: Performed at [facility], [address] [place], [place] [address]  Magnesium     Status: Abnormal   Collection Time: 11/02/20  4:06 AM  Result Value Ref Range   Magnesium 1.6 (L) 1.7 - 2.4 mg/dL    Comment: Performed at [facility], [address], [place], [place] [address]   CT BONE MARROW BIOPSY  Result Date: 11/01/2020 INDICATION: 65 year old male with history of multiple myeloma. EXAM: CT-GUIDED BONE MARROW BIOPSY AND ASPIRATION MEDICATIONS: None ANESTHESIA/SEDATION: Fentanyl 37.5 mcg IV; Versed 1 mg IV Sedation Time: 16 minutes; The  patient was continuously monitored during the procedure by the interventional radiology nurse under my direct  supervision. COMPLICATIONS: None immediate. PROCEDURE: Informed consent was obtained from the patient following an explanation of the procedure, risks, benefits and alternatives. The patient understands, agrees and consents for the procedure. All questions were addressed. A time out was performed prior to the initiation of the procedure. The patient was positioned prone and non-contrast localization CT was performed of the pelvis to demonstrate the iliac marrow spaces. The operative site was prepped and draped in the usual sterile fashion. Under sterile conditions and local anesthesia, a 22 gauge spinal needle was utilized for procedural planning. Next, an 11 gauge coaxial bone biopsy needle was advanced into the right iliac marrow space. Needle position was confirmed with CT imaging. Initially, a bone marrow aspiration was performed. Next, a bone marrow biopsy was obtained with the 11 gauge outer bone marrow device. The 11 gauge coaxial bone biopsy needle was re-advanced into a slightly different location within the left iliac marrow space, positioning was confirmed with CT imaging and an additional bone marrow biopsy was obtained. Samples were prepared with the cytotechnologist and deemed adequate. The needle was removed and superficial hemostasis was obtained with manual compression. A dressing was applied. The patient tolerated the procedure well without immediate post procedural complication. IMPRESSION: Successful CT guided right iliac bone marrow aspiration and core biopsy. Ruthann Cancer, MD [facility] Radiology Electronically Signed   By: Ruthann Cancer MD   On: 11/01/2020 10:52     MEDICATIONS: I have reviewed the patient's current medications.     Assessment/Plan:  1.  Multiple myeloma: - SPEP with 5.4 g/DL of M spike. - Immunofixation shows IgG kappa monoclonal protein. - Kappa light chains are 11335, free light chain ratio 1030. - LDH normal.   Beta-2 microglobulin 18.7. - 24-hour urine total protein was 3.6 g.  Free kappa light chains were 9558. - Status post bone marrow aspiration and biopsy on 11/01/2020.  Report including cytogenetics and FISH panel pending. - Received dexamethasone 40 mg on 11/01/2020. - I have discussed with the patient in detail about his new diagnosis.  I have talked to him about giving a dose of cyclophosphamide to prevent any further renal damage. - He will receive Cytoxan 200 mg/m2 today while we are waiting for bone marrow biopsy results.  We discussed side effects in detail.  Patient is in and out of confusion.  I have also talked to patient's brother [name] earlier on Monday. - If he is discharged, he will follow-up with me in the office next week.  Discussed with Dr. Joesph Fillers.  2.  Hypercalcemia: - Presentation with calcium of 14.  Received pamidronate and calcitonin.  Calcium today is 8.3.  3.  Severe anemia: - Presentation with hemoglobin 3.9, MCV 121 and vitamin B12 103. - Multifactorial anemia from myeloma, renal insufficiency and B12 deficiency.  He is started on B12 injections. - Hemoglobin today 7.0.  4.  Acute kidney injury: - Highly suspicious for myeloma kidney.  Creatinine improved to around 3.  5.  Moderate thrombocytopenia: - Platelet count is between 70-80. - CT AP on 10/30/2020 showed splenomegaly with an enlarged lobular spleen which could be contributing to thrombocytopenia. - Multifactorial etiology myeloma and splenomegaly.  All questions were answered. The patient knows to call the clinic with any problems, questions or concerns. We can certainly see the patient much sooner if necessary.    Derek Jack

## 2020-11-02 NOTE — Progress Notes (Signed)
Nephrology Progress Note:  Subjective:  He was taken upstairs to the cancer center for infusion and is to get chemo today.  Dexamethasone currently going in.  Had 2.9 liters UOP over 4/19.  Has been on NS at 49ml/hr.  He states that he doesn't really understand what all of this means and per charting has been confused .   Review of systems:  Denies shortness of breath or chest pain Denies n/v  Objective Vital signs in last 24 hours: Vitals:   11/02/20 0700 11/02/20 0732 11/02/20 0739 11/02/20 1000  BP: (!) 115/53     Pulse: 66   95  Resp: 19   (!) 21  Temp:  97.8 F (36.6 C)    TempSrc:  Oral    SpO2: 94%  98% 93%  Weight:      Height:       Weight change: 4.6 kg  Intake/Output Summary (Last 24 hours) at 11/02/2020 1014 Last data filed at 11/02/2020 0734 Gross per 24 hour  Intake 3207.56 ml  Output 2900 ml  Net 307.56 ml    Assessment/Plan: 65 year old white male with history of hypertension.  He had a positive SPEP and slightly elevated calcium in September 2021 but refused further evaluation.  He now presents with hypercalcemia, AKI, extreme anemia and extensive lytic lesions on bony skeleton  1.AKI  On CKD with creatinine of 1.23 in September, now initially over 4.  Urinalysis showing 100 of protein but no cells.  Imaging does not reveal hydronephrosis.  Given the fact that he had a positive SPEP 7 months ago and now has all the hallmarks signs of myeloma, myeloma and myeloma kidney suspected.   - Continue supportive care with IV fluids   2.  Probable myeloma- difficult situation. Per charting from PCP, patient in the past was not on board with preventative health care or referrals to specialists. Brother had been speaking for patient given AMS.  per hem/onc.  3. Hypertension/volume  -initially hypotensive.  Has improved with IV fluid hydration.    4.  Hypercalcemia-with lytic bone lesions likely due to myeloma.  Initial calcium of 14 has improved with pamidronate and IV  fluids  5. Anemia  -extreme and requiring transfusion. Setting of myeloma. Transfusion per primary   6.  Positive troponin-could be either ischemic heart disease or demand ischemia.  Cardiology has been consulted  7.  Hyponatremia-  Hypovolemic hyponatremia-  Improved from admit with IVF     Labs: Basic Metabolic Panel: Recent Labs  Lab 10/31/20 0432 11/01/20 0410 11/02/20 0406  NA 131* 131* 130*  K 3.8 3.3* 3.5  CL 103 102 103  CO2 $Re'24 24 22  'JcJ$ GLUCOSE 92 73 107*  BUN 48* 49* 54*  CREATININE 4.05* 3.66* 3.04*  CALCIUM 11.5* 9.7 8.3*   Liver Function Tests: Recent Labs  Lab 10/31/20 0432 11/01/20 0410 11/02/20 0406  AST 41 36 32  ALT $Re'16 15 15  'vMj$ ALKPHOS 51 48 45  BILITOT 0.7 0.9 1.1  PROT 10.3* 9.5* 8.8*  ALBUMIN 2.0* 1.9* 1.7*   No results for input(s): LIPASE, AMYLASE in the last 168 hours. Recent Labs  Lab 10/29/20 2307  AMMONIA 13   CBC: Recent Labs  Lab 10/29/20 2307 10/30/20 0931 10/31/20 0432 11/01/20 0410 11/02/20 0406  WBC 5.9 5.1 7.2 5.3 4.5  NEUTROABS 3.3 3.6  --   --   --   HGB 3.9* 5.5* 7.7* 7.5* 7.0*  HCT 12.0* 16.8* 22.9* 23.1* 21.3*  MCV 121.2* 104.3* 97.9  99.6 98.2  PLT 125* 97* 93* 78* 71*   Cardiac Enzymes: Recent Labs  Lab 10/29/20 2307  CKTOTAL 17*   CBG: No results for input(s): GLUCAP in the last 168 hours.  Iron Studies:  No results for input(s): IRON, TIBC, TRANSFERRIN, FERRITIN in the last 72 hours. Studies/Results: DG Chest Port 1 View  Result Date: 10/31/2020 CLINICAL DATA:  Altered mental status EXAM: PORTABLE CHEST 1 VIEW COMPARISON:  10/29/2020 FINDINGS: New elevation of the right hemidiaphragm with right basilar pleuroparenchymal process. Chronic interstitial changes. No pneumothorax. Similar cardiomediastinal contours. IMPRESSION: New elevation of the right hemidiaphragm with right basilar atelectasis/consolidation and possible pleural effusion. Electronically Signed   By: Macy Mis M.D.   On: 10/31/2020 11:44    CT BONE MARROW BIOPSY  Result Date: 11/01/2020 INDICATION: 65 year old male with history of multiple myeloma. EXAM: CT-GUIDED BONE MARROW BIOPSY AND ASPIRATION MEDICATIONS: None ANESTHESIA/SEDATION: Fentanyl 37.5 mcg IV; Versed 1 mg IV Sedation Time: 16 minutes; The patient was continuously monitored during the procedure by the interventional radiology nurse under my direct supervision. COMPLICATIONS: None immediate. PROCEDURE: Informed consent was obtained from the patient following an explanation of the procedure, risks, benefits and alternatives. The patient understands, agrees and consents for the procedure. All questions were addressed. A time out was performed prior to the initiation of the procedure. The patient was positioned prone and non-contrast localization CT was performed of the pelvis to demonstrate the iliac marrow spaces. The operative site was prepped and draped in the usual sterile fashion. Under sterile conditions and local anesthesia, a 22 gauge spinal needle was utilized for procedural planning. Next, an 11 gauge coaxial bone biopsy needle was advanced into the right iliac marrow space. Needle position was confirmed with CT imaging. Initially, a bone marrow aspiration was performed. Next, a bone marrow biopsy was obtained with the 11 gauge outer bone marrow device. The 11 gauge coaxial bone biopsy needle was re-advanced into a slightly different location within the left iliac marrow space, positioning was confirmed with CT imaging and an additional bone marrow biopsy was obtained. Samples were prepared with the cytotechnologist and deemed adequate. The needle was removed and superficial hemostasis was obtained with manual compression. A dressing was applied. The patient tolerated the procedure well without immediate post procedural complication. IMPRESSION: Successful CT guided right iliac bone marrow aspiration and core biopsy. Ruthann Cancer, MD [facility]  Special[facility] Radiology Electronically Signed   By: Ruthann Cancer MD   On: 11/01/2020 10:52   Medications: Infusions: . sodium chloride 75 mL/hr at 11/02/20 0734    Scheduled Medications: . Chlorhexidine Gluconate Cloth  6 each Topical Daily  . feeding supplement  237 mL Oral BID BM  . fluticasone furoate-vilanterol  1 puff Inhalation Daily  . rosuvastatin  10 mg Oral QHS    have reviewed scheduled and prn medications.  Physical Exam: General adult male in chair in no acute distress HEENT normocephalic atraumatic extraocular movements intact sclera anicteric Neck supple trachea midline Lungs clear to auscultation bilaterally normal work of breathing at rest  Heart regular rate and rhythm no rubs or gallops appreciated Abdomen soft nontender nondistended Extremities no edema  Psych no anxiety or agitation  Neuro - tells me name, [place], and year of 2022.  Not able to offer many other details at this time   Claudia Desanctis, MD 11/02/2020,10:34 AM

## 2020-11-02 NOTE — Patient Instructions (Addendum)
[place] will receive a 1 time dose of Cytoxan today in the clinic. Your treatment plan going forward will be determined by the results of your bone marrow biopsy. Once you are discharged from the hospital, you will return to [facility] to follow-up with Dr. Raliegh Ip and go over the results of your bone marrow biopsy and discuss a treatment plan for treatment of your multiple myeloma.   Cyclophosphamide (Cytoxan)  About This Drug Cyclophosphamide is a drug used to treat cancer. It is given in the vein (IV) or by mouth.  Takes 30 minutes for this drug to infuse.  Possible Side Effects (More Common) . Nausea and throwing up (vomiting). These symptoms may happen within a few hours after your treatment and may last up to 72 hours. Medicines are available to stop or lessen these side effects.  . Bone marrow depression. This is a decrease in the number of white blood cells, red blood cells, and platelets. This may raise your risk of infection, make you tired and weak (fatigue), and raise your risk of bleeding.  . Hair loss: You may notice hair getting thin. Some patients lose their hair. Hair loss is often complete scalp hair loss and can involve loss of eyebrows, eyelashes, and pubic hair. You may notice this a few days or weeks after treatment has started. Most often hair loss is temporary; your hair should grow back when treatment is done.  . Decreased appetite (decreased hunger)  . Blurred vision  . Soreness of the mouth and throat. You may have red areas, white patches, or sores that hurt.  . Effects on the bladder. This drug may cause irritation and bleeding in the bladder. You may have blood in your urine. To help stop this, you will get extra fluids to help you pass more urine. You may get a drug called mesna, which helps to decrease irritation and bleeding. You may also get a medicine to help you pass more urine. You may have a  catheter (tube) placed in your bladder so that your bladder will be washed with this drug.  Possible Side Effects (Less Common) . Darkening of the skin or nails . Metallic taste in the mouth . Changes in lung tissue may happen with large amounts of this drug. These changes may not last forever, and your lung tissue may go back to normal. Sometimes these changes may not be seen for many years. You may get a cough or have trouble catching your breath.  Allergic Reactions   Serious allergic reactions including anaphylaxis are rare. While you are getting this drug in your vein (IV), tell your nurse right away if you have any of these symptoms of an allergic reaction: . Trouble catching your breath . Feeling like your tongue or throat are swelling . Feeling your heart beat quickly or in a not normal way (palpitations) . Feeling dizzy or lightheaded . Flushing, itching, rash, and/or hives  Treating Side Effects . Drink 6-8 cups of fluids each day unless your doctor has told you to limit your fluid intake due to some other health problem. A cup is 8 ounces of fluid. If you throw up or have loose bowel movements you should drink more fluids so that you do not become dehydrated (lack water in the body due to losing too much fluid). . Ask your doctor or nurse about medicine that is available to help stop or lessen nausea or throwing  up. . Mouth care is very important. Your mouth care should consist of routine, gentle cleaning of your teeth or dentures and rinsing your mouth with a mixture of 1/2 teaspoon of salt in 8 ounces of water or  teaspoon of baking soda in 8 ounces of water. This should be done at least after each meal and at bedtime. . If you have mouth sores, avoid mouthwash that has alcohol. Also avoid alcohol and smoking because they can bother your mouth and throat. . Talk with your nurse about getting a wig before you lose your hair. Also, call the [place] at [phone]-ACS-2345 to  find out information about the " Look Good.Marland KitchenMarland KitchenFeel Better" program close to where you live. It is a free program where women undergoing chemotherapy learn about wigs, turbans and scarves as well as makeup techniques and skin and nail care.  Important Information . Whenever you tell a doctor or nurse your health history, always tell them that you have received cyclophosphamide in the past. . If you take this drug by mouth swallow the medicine whole. Do not chew, break or crush it. . You can take the medicine with or without food. If you have nausea, take it with food. Do not take the pills at bedtime.  Food and Drug Interactions There are no known interactions of cyclophosphamide with food. This drug may interact with other medicines. Tell your doctor and pharmacist about all the medicines and dietary supplements (vitamins, minerals, herbs and others) that you are taking at this time. The safety and use of dietary supplements and alternative diets are often not known. Using these might affect your cancer or interfere with your treatment. Until more is known, you should not use dietary supplements or alternative diets without your cancer doctor's help.  When to Call the Doctor Call your doctor or nurse right away if you have any of these symptoms: . Fever of 100.5 F (38 C) or higher . Chills . Bleeding or bruising that is not normal . Blurred vision or other changes in eyesight . Pain when passing urine; blood in urine . Pain in your lower back or side . Wheezing or trouble breathing . Swelling of legs, ankles, or feet . Feeling dizzy or lightheaded . Feeling confused or agitated . Signs of liver problems: dark urine, pale bowel movements, bad stomach pain, feeling very tired and weak, unusual itching, or yellowing of the eyes or skin . Unusual thirst or passing urine often . Nausea that stops you from eating or drinking . Throwing up  Call your doctor or nurse as soon as possible if any of  these symptoms happen: . Pain in your mouth or throat that makes it hard to eat or drink . Nausea not relieved by prescribed medicines  Sexual Problems and Reproductive Concerns . Infertility warning: Sexual problems and reproduction concerns may happen. In both men and women, this drug may affect your ability to have children. This cannot be determined before your treatment. Talk with your doctor or nurse if you plan to have children. Ask for information on sperm or egg banking. . In men, this drug may interfere with your ability to make sperm, but it should not change your ability to have sexual relations. . In women, menstrual bleeding may become irregular or stop while you are getting this drug. Do not assume that you cannot become pregnant if you do not have a menstrual period. . Women may go through signs of menopause (change of life)  like vaginal dryness or itching. Vaginal lubricants can be used to lessen vaginal dryness, itching, and pain during sexual relations. . Genetic counseling is available for you to talk about the effects of this drug therapy on future pregnancies. Also, a genetic counselor can look at the possible risk of problems in the unborn baby due to this medicine if an exposure happens during pregnancy. . Pregnancy warning: This drug may have harmful effects on the unborn child, so effective methods of birth control should be used during your cancer treatment. . Breast feeding warning: Women should not breast feed during treatment because this drug could enter the breast milk and badly harm a breast feeding baby   [place]:  Hydration Increase your fluid intake 48 hours prior to treatment and drink at least 8 to 12 cups (64 ounces) of water/decaffeinated beverages per day after treatment. You can still have your cup of coffee or soda but these beverages do not count as part of your 8 to 12 cups that you need to drink daily. No alcohol  intake.  Medications Continue taking your normal prescription medication as prescribed.  If you start any new herbal or new supplements please let us know first to make sure it is safe.  Mouth Care Have teeth cleaned professionally before starting treatment. Keep dentures and partial plates clean. Use soft toothbrush and do not use mouthwashes that contain alcohol. Biotene is a good mouthwash that is available at most pharmacies or may be ordered by calling [phone]. Use warm salt water gargles (1 teaspoon salt per 1 quart warm water) before and after meals and at bedtime. If you need dental work, please let the doctor know before you go for your appointment so that we can coordinate the best possible time for you in regards to your chemo regimen. You need to also let your dentist know that you are actively taking chemo. We may need to do labs prior to your dental appointment.  Skin Care Always use sunscreen that has not expired and with SPF (Sun Protection Factor) of 50 or higher. Wear hats to protect your head from the sun. Remember to use sunscreen on your hands, ears, face, & feet.  Use good moisturizing lotions such as udder cream, eucerin, or even Vaseline. Some chemotherapies can cause dry skin, color changes in your skin and nails.    . Avoid long, hot showers or baths. . Use gentle, fragrance-free soaps and laundry detergent. . Use moisturizers, preferably creams or ointments rather than lotions because the thicker consistency is better at preventing skin dehydration. Apply the cream or ointment within 15 minutes of showering. Reapply moisturizer at night, and moisturize your hands every time after you wash them.  Hair Loss (if your doctor says your hair will fall out)  . If your doctor says that your hair is likely to fall out, decide before you begin chemo whether you want to wear a wig. You may want to shop before treatment to match your hair color. . Hats, turbans, and scarves  can also camouflage hair loss, although some people prefer to leave their heads uncovered. If you go bare-headed outdoors, be sure to use sunscreen on your scalp. . Cut your hair short. It eases the inconvenience of shedding lots of hair, but it also can reduce the emotional impact of watching your hair fall out. . Don't perm or color your hair during chemotherapy. Those chemical treatments are already damaging to hair and can  enhance hair loss. Once your chemo treatments are done and your hair has grown back, it's OK to resume dyeing or perming hair.  With chemotherapy, hair loss is almost always temporary. But when it grows back, it may be a different color or texture. In older adults who still had hair color before chemotherapy, the new growth may be completely gray.  Often, new hair is very fine and soft.  Infection Prevention Please wash your hands for at least 30 seconds using warm soapy water. Handwashing is the #1 way to prevent the spread of germs. Stay away from sick people or people who are getting over a cold. If you develop respiratory systems such as green/yellow mucus production or productive cough or persistent cough let us know and we will see if you need an antibiotic. It is a good idea to keep a pair of gloves on when going into grocery stores/[employer] to decrease your risk of coming into contact with germs on the carts, etc. Carry alcohol hand gel with you at all times and use it frequently if out in public. If your temperature reaches 100.4 or higher please call the clinic and let us know.  If it is after hours or on the weekend please go to the ER if your temperature is over 100.4.  Please have your own personal thermometer at home to use.    Sex and bodily fluids If you are going to have sex, a condom must be used to protect the person that isn't taking chemotherapy. Chemo can decrease your libido (sex drive). For a few days after chemotherapy, chemotherapy can be excreted through  your bodily fluids.  When using the toilet please close the lid and flush the toilet twice.  Do this for a few day after you have had chemotherapy.   Effects of chemotherapy on your sex life Some changes are simple and won't last long. They won't affect your sex life permanently.  Sometimes you may feel: . too tired . not strong enough to be very active . sick or sore  . not in the mood . anxious or low  Your anxiety might not seem related to sex. For example, you may be worried about the cancer and how your treatment is going. Or you may be worried about money, or about how you family are coping with your illness.  These things can cause stress, which can affect your interest in sex. It's important to talk to your partner about how you feel.  Remember - the changes to your sex life don't usually last long. There's usually no medical reason to stop having sex during chemo. The drugs won't have any long term physical effects on your performance or enjoyment of sex. Cancer can't be passed on to your partner during sex  Contraception It's important to use reliable contraception during treatment. Avoid getting pregnant while you or your partner are having chemotherapy. This is because the drugs may harm the baby. Sometimes chemotherapy drugs can leave a man or woman infertile.  This means you would not be able to have children in the future. You might want to talk to someone about permanent infertility. It can be very difficult to learn that you may no longer be able to have children. Some people find counselling helpful. There might be ways to preserve your fertility, although this is easier for men than for women. You may want to speak to a fertility expert. You can talk about sperm banking or harvesting your eggs.  You can also ask about other fertility options, such as donor eggs. If you have or have had breast cancer, your doctor might advise you not to take the contraceptive pill. This is because the  hormones in it might affect the cancer. It is not known for sure whether or not chemotherapy drugs can be passed on through semen or secretions from the vagina. Because of this some doctors advise people to use a barrier method if you have sex during treatment. This applies to vaginal, anal or oral sex. Generally, doctors advise a barrier method only for the time you are actually having the treatment and for about a week after your treatment. Advice like this can be worrying, but this does not mean that you have to avoid being intimate with your partner. You can still have close contact with your partner and continue to enjoy sex.  Animals If you have cats or birds we just ask that you not change the litter or change the cage.  Please have someone else do this for you while you are on chemotherapy.   Food Safety During and After Cancer Treatment Food safety is important for people both during and after cancer treatment. Cancer and cancer treatments, such as chemotherapy, radiation therapy, and stem cell/bone marrow transplantation, often weaken the immune system. This makes it harder for your body to protect itself from foodborne illness, also called food poisoning. Foodborne illness is caused by eating food that contains harmful bacteria, parasites, or viruses.  Foods to avoid Some foods have a higher risk of becoming tainted with bacteria. These include: Marland Kitchen Unwashed fresh fruit and vegetables, especially leafy vegetables that can hide dirt and other contaminants . Raw sprouts, such as alfalfa sprouts . Raw or undercooked beef, especially ground beef, or other raw or undercooked meat and poultry . Fatty, fried, or spicy foods immediately before or after treatment.  These can sit heavy on your stomach and make you feel nauseous. . Raw or undercooked shellfish, such as oysters. . Sushi and sashimi, which often contain raw fish.  . Unpasteurized beverages, such as unpasteurized fruit juices, raw milk,  raw yogurt, or cider . Undercooked eggs, such as soft boiled, over easy, and poached; raw, unpasteurized eggs; or foods made with raw egg, such as homemade raw cookie dough and homemade mayonnaise  Simple steps for food safety  Shop smart. . Do not buy food stored or displayed in an unclean area. . Do not buy bruised or damaged fruits or vegetables. . Do not buy cans that have cracks, dents, or bulges. . Pick up foods that can spoil at the end of your shopping trip and store them in a cooler on the way home.  Prepare and clean up foods carefully. . Rinse all fresh fruits and vegetables under running water, and dry them with a clean towel or paper towel. . Clean the top of cans before opening them. . After preparing food, wash your hands for 20 seconds with hot water and soap. Pay special attention to areas between fingers and under nails. . Clean your utensils and dishes with hot water and soap. Marland Kitchen Disinfect your kitchen and cutting boards using 1 teaspoon of liquid, unscented bleach mixed into 1 quart of water.    Dispose of old food. . Eat canned and packaged food before its expiration date (the "use by" or "best before" date). . Consume refrigerated leftovers within 3 to 4 days. After that time, throw out the food. Even if the food  does not smell or look spoiled, it still may be unsafe. Some bacteria, such as Listeria, can grow even on foods stored in the refrigerator if they are kept for too long.  Take precautions when eating out. . At restaurants, avoid buffets and salad bars where food sits out for a long time and comes in contact with many people. Food can become contaminated when someone with a virus, often a norovirus, or another "bug" handles it. . Put any leftover food in a "to-go" container yourself, rather than having the server do it. And, refrigerate leftovers as soon as you get home. . Choose restaurants that are clean and that are willing to prepare your food as you order it  cooked.   AT HOME MEDICATIONS:                                                                                                                                                                Compazine/Prochlorperazine 10mg  tablet. Take 1 tablet every 6 hours as needed for nausea/vomiting. (This can make you sleepy)   EMLA cream. Apply a quarter size amount to port site 1 hour prior to chemo. Do not rub in. Cover with plastic wrap.    Diarrhea Sheet   If you are having loose stools/diarrhea, please purchase Imodium and begin taking as outlined:  At the first sign of poorly formed or loose stools you should begin taking Imodium (loperamide) 2 mg capsules.  Take two tablets (4mg ) followed by one tablet (2mg ) every 2 hours - DO NOT EXCEED 8 tablets in 24 hours.  If it is bedtime and you are having loose stools, take 2 tablets at bedtime, then 2 tablets every 4 hours until morning.   Always call the [facility] if you are having loose stools/diarrhea that you can't get under control.  Loose stools/diarrhea leads to dehydration (loss of water) in your body.  We have other options of trying to get the loose stools/diarrhea to stop but you must let us know!   Constipation Sheet  Colace - 100 mg capsules - take 2 capsules daily.  If this doesn't help then you can increase to 2 capsules twice daily.  Please call if the above does not work for you. Do not go more than 2 days without a bowel movement.  It is very important that you do not become constipated.  It will make you feel sick to your stomach (nausea) and can cause abdominal pain and vomiting.  Nausea Sheet   Compazine/Prochlorperazine 10mg  tablet. Take 1 tablet every 6 hours as needed for nausea/vomiting (This can make you drowsy).  If you are having persistent nausea (nausea that does not stop) please call the [facility] and let us know the amount of nausea that you are  experiencing.  If you begin to vomit, you need to call the [facility] and if it is the weekend and you have vomited more than one time and can't get it to stop-go to the Emergency Room.  Persistent nausea/vomiting can lead to dehydration (loss of fluid in your body) and will make you feel very weak and unwell. Ice chips, sips of clear liquids, foods that are at room temperature, crackers, and toast tend to be better tolerated.   SYMPTOMS TO REPORT AS SOON AS POSSIBLE AFTER TREATMENT:  FEVER GREATER THAN 100.4 F  CHILLS WITH OR WITHOUT FEVER  NAUSEA AND VOMITING THAT IS NOT CONTROLLED WITH YOUR NAUSEA MEDICATION  UNUSUAL SHORTNESS OF BREATH  UNUSUAL BRUISING OR BLEEDING  TENDERNESS IN MOUTH AND THROAT WITH OR WITHOUT PRESENCE OF ULCERS  URINARY PROBLEMS  BOWEL PROBLEMS  UNUSUAL RASH      Wear comfortable clothing and clothing appropriate for easy access to any Portacath or PICC line. Let us know if there is anything that we can do to make your therapy better!    What to do if you need assistance after hours or on the weekends: CALL [phone].  HOLD on the line, do not hang up.  You will hear multiple messages but at the end you will be connected with a nurse triage line.  They will contact the doctor if necessary.  Most of the time they will be able to assist you.  Do not call the hospital operator.      I have been informed and understand all of the instructions given to me and have received a copy. I have been instructed to call the clinic [phone] or my family physician as soon as possible for continued medical care, if indicated. I do not have any more questions at this time but understand that I may call the [facility] or the Patient Navigator at [phone] during office hours should I have questions or need assistance in obtaining follow-up care.

## 2020-11-02 NOTE — Progress Notes (Signed)
Palliative: [name], Paul Norton, is sitting up in bed dry brushing his teeth.  He greets me making and mostly keeping eye contact.  He appears chronically ill and frail.  Although he is alert and oriented x3, he continues to have periods of confusion.  There is no family at bedside initially, but as we are speaking, Paul Norton's brother Paul Norton arrives.  We talked about chemotherapy this morning.  Although Paul Norton clearly consented to chemotherapy this morning when I asked him how it went he states, "I did not know what they were doing".  He does tell me that he will except what ever treatment plan his brother and the medical team endorses.  We talked about time for outcomes, seeing how he does, is able to recover.  Often, Paul Norton will return to talking about his issues with COVID/carbon monoxide poisoning (states he is on short-term disability for 22 months for this), even when he broke his leg.  Brother Paul Norton follows me into the hallway stating that his wife Paul Norton is working on finding out if Paul Norton still has insurance through his former [employer].  We talked about the possible need for short-term rehab, need for PT evaluation.  Paul Norton agrees that in the future Paul Norton may decide that he does not want to continue care.  Paul Norton has declined colonoscopy in the past and rarely sees PCP.  Conference with attending, bedside nursing staff, transition of care team related to patient condition, needs, goals of care, disposition.  Plan: Continue full scope/full code.  Continue chemotherapy as offered.  Possible need for short-term rehab.  35 minutes  Quinn Axe, NP Palliative medicine team Team phone [phone] Greater than 50% of this time was spent counseling and coordinating care related to the above assessment and plan.

## 2020-11-02 NOTE — Progress Notes (Signed)
PROGRESS NOTE   Paul Norton  LGX:[id number] DOB: 04-16-1956 DOA: 10/29/2020 PCP: Carlena Hurl, PA-C   Chief Complaint  Patient presents with  . Altered Mental Status   Level of care: ICU  Brief Admission History:  65 y.o. male, with history of HTN, HLD, and history of noncompliance with PCP advised normal presents to the ED with a chief complaint of confusion.  He was admitted with severe hypercalcemia, multiple bony lytic lesions in the calvarium and spinal column, acute renal failure, thrombocytopenia, severe anemia with hemoglobin of 3.9, severe dehydration, abnormal EKG, and severe encephalopathy.  Assessment & Plan:   Principal Problem:   Hypercalcemia Active Problems:   Essential hypertension, benign   Noncompliance   Hyperlipidemia   DOE (dyspnea on exertion)   AKI (acute kidney injury) (HCC)   Symptomatic anemia   Acute metabolic encephalopathy   Hyponatremia   Moderate protein-calorie malnutrition (HCC)   Dehydration   Abnormal CT of the head   Thrombocytopenia (HCC)   Refuses treatment   Metastatic malignant neoplasm (HCC)   Multiple myeloma not having achieved remission (HCC)  Hypercalcemia of malignancy -Patient presents with severe calcium elevation -Improving with IV fluids, IV pamidronate given on 4/16, calcitonin - Continue to monitor closely -Mentation improving-calcium levels improved  Acute metabolic encephalopathy - secondary to severe hypercalcemia - Mentation should improve as calcium is corrected  Severe anemia/thrombocytopenia - concerning for underlying MDS versus multiple myeloma - Hg up to 7.7 after 4 units PRBC transfusion. - Follow CBC - No active bleeding found, stool was guaiac negative - holding all heparin for now -Received chemotherapy Cytoxan dose reduced to 200 mg/m2 on 11/02/2020  Acute renal failure / myeloma kidney - Pt presented with severe dehydration and active untreated multiple myeloma - continue IV fluid  hydration per renal team recommendations -  Renally dose medications, appreciate nephrology consultation  --Received chemotherapy Cytoxan dose reduced to 200 mg/m2 on 11/02/2020   NSTEMI  - from demand ischemia related to very severe anemia - HS troponin peaked 3336 - Pt denies chest pain - EKG with ST depression, no ST elevation seen, reviewed with cardiologist at [facility] - see Dr. Harl Bowie consult note.   Not a candidate for heparin/aspirin at this time.  Hypomagnesemia - IV replacement given,   Hyponatremia  - from poor oral intake and severe dehydration and malignancy associated SIADH - Treating with IV fluids, follow BMP.  Vitamin B12 deficiency - Started B12 injections  Question of multiple myeloma or MDS - SPEP highly suggestive with M spike - obtain 24 hour urine for UPEP, light chains - Arranged for urgent bone marrow biopsy on 4/19 at [facility] - Follow up bone marrow biopsy results.  Discussed with Dr. Delton Coombes --Received chemotherapy Cytoxan dose reduced to 200 mg/m2 on 11/02/2020  Hyperlipidemia - by history, lipid panel in AM - resumed home rosuvastatin daily  Essential hypertension - holding meds due to soft BPs   Generalized weakness/ambulatory dysfunction--- PT eval,  May need SNF rehab   DVT prophylaxis: SCD Code Status: Full  Family Communication: Brother visited Disposition: TBD Status is: Inpatient  Remains inpatient appropriate because:Persistent severe electrolyte disturbances, IV treatments appropriate due to intensity of illness or inability to take PO and Inpatient level of care appropriate due to severity of illness  Dispo: The patient is from: Home              Anticipated d/c is to: TBD  Patient currently is not medically stable to d/c.   Difficult to place patient No   Consultants:   Hem/onc   cardiology  Procedures:   -Received chemotherapy Cytoxan dose reduced to 200 mg/m2 on 11/02/2020 Antimicrobials:     Subjective: Remains confused but overall mentation improving -Oral intake is not great  Objective: Vitals:   11/02/20 0732 11/02/20 0739 11/02/20 1000 11/02/20 1648  BP:      Pulse:   95   Resp:   (!) 21   Temp: 97.8 F (36.6 C)   98.2 F (36.8 C)  TempSrc: Oral   Oral  SpO2:  98% 93%   Weight:      Height:        Intake/Output Summary (Last 24 hours) at 11/02/2020 1825 Last data filed at 11/02/2020 1649 Gross per 24 hour  Intake 1365.43 ml  Output 3700 ml  Net -2334.57 ml   Filed Weights   10/31/20 0343 11/01/20 0500 11/02/20 0500  Weight: 70.9 kg 68 kg 72.6 kg   Examination:  Physical Exam Gen:- Awake Alert,  In no apparent distress  HEENT:- [place].AT, No sclera icterus Neck-Supple Neck,No JVD,.  Lungs-air movement is fair, no wheezing CV- S1, S2 normal, regular Abd-  +ve B.Sounds, Abd Soft, No tenderness,    Extremity/Skin:- No  edema, good pedal pulses Psych-less confused, less disoriented Neuro-generalized weakness, no new focal deficits, no tremors   Data Reviewed: I have personally reviewed following labs and imaging studies  CBC: Recent Labs  Lab 10/29/20 2307 10/30/20 0931 10/31/20 0432 11/01/20 0410 11/02/20 0406  WBC 5.9 5.1 7.2 5.3 4.5  NEUTROABS 3.3 3.6  --   --   --   HGB 3.9* 5.5* 7.7* 7.5* 7.0*  HCT 12.0* 16.8* 22.9* 23.1* 21.3*  MCV 121.2* 104.3* 97.9 99.6 98.2  PLT 125* 97* 93* 78* 71*   Basic Metabolic Panel: Recent Labs  Lab 10/29/20 2307 10/30/20 0931 10/30/20 1741 10/31/20 0432 11/01/20 0410 11/02/20 0406  NA 127* 130*  --  131* 131* 130*  K 3.8 4.3  --  3.8 3.3* 3.5  CL 96* 100  --  103 102 103  CO2 24 25  --  $R'24 24 22  'aL$ GLUCOSE 129* 135*  --  92 73 107*  BUN 47* 45*  --  48* 49* 54*  CREATININE 4.50* 3.96*  --  4.05* 3.66* 3.04*  CALCIUM 14.2* 12.8* 12.4* 11.5* 9.7 8.3*  MG  --  1.7  --  1.5* 1.9 1.6*    GFR: Estimated Creatinine Clearance: 24.9 mL/min (A) (by C-G formula based on SCr of 3.04 mg/dL (H)).  Liver  Function Tests: Recent Labs  Lab 10/29/20 2307 10/30/20 0931 10/31/20 0432 11/01/20 0410 11/02/20 0406  AST 32 39 41 36 32  ALT $Re'18 18 16 15 15  'RMP$ ALKPHOS 64 59 51 48 45  BILITOT 0.7 0.6 0.7 0.9 1.1  PROT 12.4* 11.2* 10.3* 9.5* 8.8*  ALBUMIN 2.5* 2.2* 2.0* 1.9* 1.7*    CBG: No results for input(s): GLUCAP in the last 168 hours.  Recent Results (from the past 240 hour(s))  Resp Panel by RT-PCR (Flu A&B, Covid) Nasopharyngeal Swab     Status: None   Collection Time: 10/29/20 10:28 PM   Specimen: Nasopharyngeal Swab; Nasopharyngeal(NP) swabs in vial transport medium  Result Value Ref Range Status   SARS Coronavirus 2 by RT PCR NEGATIVE NEGATIVE Final    Comment: (NOTE) SARS-CoV-2 target nucleic acids are NOT DETECTED.  The SARS-CoV-2 RNA is  generally detectable in upper respiratory specimens during the acute phase of infection. The lowest concentration of SARS-CoV-2 viral copies this assay can detect is 138 copies/mL. A negative result does not preclude SARS-Cov-2 infection and should not be used as the sole basis for treatment or other patient management decisions. A negative result may occur with  improper specimen collection/handling, submission of specimen other than nasopharyngeal swab, presence of viral mutation(s) within the areas targeted by this assay, and inadequate number of viral copies(<138 copies/mL). A negative result must be combined with clinical observations, patient history, and epidemiological information. The expected result is Negative.  Fact Sheet for Patients:  EntrepreneurPulse.com.au  Fact Sheet for Healthcare Providers:  IncredibleEmployment.be  This test is no t yet approved or cleared by the [place] FDA and  has been authorized for detection and/or diagnosis of SARS-CoV-2 by FDA under an Emergency Use Authorization (EUA). This EUA will remain  in effect (meaning this test can be used) for the duration of  the COVID-19 declaration under Section 564(b)(1) of the Act, 21 U.S.C.section 360bbb-3(b)(1), unless the authorization is terminated  or revoked sooner.       Influenza A by PCR NEGATIVE NEGATIVE Final   Influenza B by PCR NEGATIVE NEGATIVE Final    Comment: (NOTE) The Xpert Xpress SARS-CoV-2/FLU/RSV plus assay is intended as an aid in the diagnosis of influenza from Nasopharyngeal swab specimens and should not be used as a sole basis for treatment. Nasal washings and aspirates are unacceptable for Xpert Xpress SARS-CoV-2/FLU/RSV testing.  Fact Sheet for Patients: EntrepreneurPulse.com.au  Fact Sheet for Healthcare Providers: IncredibleEmployment.be  This test is not yet approved or cleared by the [place] FDA and has been authorized for detection and/or diagnosis of SARS-CoV-2 by FDA under an Emergency Use Authorization (EUA). This EUA will remain in effect (meaning this test can be used) for the duration of the COVID-19 declaration under Section 564(b)(1) of the Act, 21 U.S.C. section 360bbb-3(b)(1), unless the authorization is terminated or revoked.  Performed at [facility], [address] [place], [place] [address]   MRSA PCR Screening     Status: None   Collection Time: 10/30/20  1:25 AM   Specimen: Nasal Mucosa; Nasopharyngeal  Result Value Ref Range Status   MRSA by PCR NEGATIVE NEGATIVE Final    Comment:        The GeneXpert MRSA Assay (FDA approved for NASAL specimens only), is one component of a comprehensive MRSA colonization surveillance program. It is not intended to diagnose MRSA infection nor to guide or monitor treatment for MRSA infections. Performed at [facility], [address] [place], [place] [address]      Radiology Studies: CT BONE MARROW BIOPSY  Result Date: 11/01/2020 INDICATION: 65 year old male with history of multiple myeloma. EXAM: CT-GUIDED BONE MARROW BIOPSY AND ASPIRATION MEDICATIONS:  None ANESTHESIA/SEDATION: Fentanyl 37.5 mcg IV; Versed 1 mg IV Sedation Time: 16 minutes; The patient was continuously monitored during the procedure by the interventional radiology nurse under my direct supervision. COMPLICATIONS: None immediate. PROCEDURE: Informed consent was obtained from the patient following an explanation of the procedure, risks, benefits and alternatives. The patient understands, agrees and consents for the procedure. All questions were addressed. A time out was performed prior to the initiation of the procedure. The patient was positioned prone and non-contrast localization CT was performed of the pelvis to demonstrate the iliac marrow spaces. The operative site was prepped and draped in the usual sterile fashion. Under sterile conditions and local anesthesia, a  22 gauge spinal needle was utilized for procedural planning. Next, an 11 gauge coaxial bone biopsy needle was advanced into the right iliac marrow space. Needle position was confirmed with CT imaging. Initially, a bone marrow aspiration was performed. Next, a bone marrow biopsy was obtained with the 11 gauge outer bone marrow device. The 11 gauge coaxial bone biopsy needle was re-advanced into a slightly different location within the left iliac marrow space, positioning was confirmed with CT imaging and an additional bone marrow biopsy was obtained. Samples were prepared with the cytotechnologist and deemed adequate. The needle was removed and superficial hemostasis was obtained with manual compression. A dressing was applied. The patient tolerated the procedure well without immediate post procedural complication. IMPRESSION: Successful CT guided right iliac bone marrow aspiration and core biopsy. Ruthann Cancer, MD [facility] Electronically Signed   By: Ruthann Cancer MD   On: 11/01/2020 10:52   Scheduled Meds: . Chlorhexidine Gluconate Cloth  6 each Topical Daily  .  feeding supplement  237 mL Oral BID BM  . fluticasone furoate-vilanterol  1 puff Inhalation Daily  . rosuvastatin  10 mg Oral QHS   Continuous Infusions: . sodium chloride Stopped (11/02/20 0812)     LOS: 3 days   Roxan Hockey, MD How to contact the [facility] Attending or Consulting provider [place] or covering provider during after hours [place], for this patient?  1. Check the care team in [facility] and look for a) attending/consulting TRH provider listed and b) the [facility] team listed 2. Log into [email or web address] and use [place]'s universal password to access. If you do not have the password, please contact the hospital operator. 3. Locate the [facility] provider you are looking for under [facility] and page to a number that you can be directly reached. 4. If you still have difficulty reaching the provider, please page the [facility] (Director on Call) for the Hospitalists listed on amion for assistance.  11/02/2020, 6:25 PM

## 2020-11-02 NOTE — Progress Notes (Signed)
Patient has been examined, vital signs and labs have been reviewed by Dr. Delton Coombes. ANC, Creatinine, LFTs, hemoglobin, and platelets are within treatment parameters per Dr. Delton Coombes. Patient is okay to proceed with treatment per M.D. Cytoxan dose reduced to 200 mg/m2 per MD.   Tolerated infusion w/o adverse reaction.  Alert, in no distress.  VSS.  Return transport to ICU with RN via wheelchair.  Report given to Janett Billow, Therapist, sports.

## 2020-11-03 LAB — CBC
HCT: 19.3 % — ABNORMAL LOW (ref 39.0–52.0)
Hemoglobin: 6.4 g/dL — CL (ref 13.0–17.0)
MCH: 32.5 pg (ref 26.0–34.0)
MCHC: 33.2 g/dL (ref 30.0–36.0)
MCV: 98 fL (ref 80.0–100.0)
Platelets: 69 10*3/uL — ABNORMAL LOW (ref 150–400)
RBC: 1.97 MIL/uL — ABNORMAL LOW (ref 4.22–5.81)
RDW: 21 % — ABNORMAL HIGH (ref 11.5–15.5)
WBC: 6.2 10*3/uL (ref 4.0–10.5)
nRBC: 0 % (ref 0.0–0.2)

## 2020-11-03 LAB — COMPREHENSIVE METABOLIC PANEL WITH GFR
ALT: 16 U/L (ref 0–44)
AST: 29 U/L (ref 15–41)
Albumin: 1.8 g/dL — ABNORMAL LOW (ref 3.5–5.0)
Alkaline Phosphatase: 47 U/L (ref 38–126)
Anion gap: 5 (ref 5–15)
BUN: 67 mg/dL — ABNORMAL HIGH (ref 8–23)
CO2: 21 mmol/L — ABNORMAL LOW (ref 22–32)
Calcium: 7.2 mg/dL — ABNORMAL LOW (ref 8.9–10.3)
Chloride: 104 mmol/L (ref 98–111)
Creatinine, Ser: 2.48 mg/dL — ABNORMAL HIGH (ref 0.61–1.24)
GFR, Estimated: 28 mL/min — ABNORMAL LOW
Glucose, Bld: 139 mg/dL — ABNORMAL HIGH (ref 70–99)
Potassium: 3 mmol/L — ABNORMAL LOW (ref 3.5–5.1)
Sodium: 130 mmol/L — ABNORMAL LOW (ref 135–145)
Total Bilirubin: 1.1 mg/dL (ref 0.3–1.2)
Total Protein: 8.9 g/dL — ABNORMAL HIGH (ref 6.5–8.1)

## 2020-11-03 LAB — PREPARE RBC (CROSSMATCH)

## 2020-11-03 LAB — HEMOGLOBIN AND HEMATOCRIT, BLOOD
HCT: 27.9 % — ABNORMAL LOW (ref 39.0–52.0)
Hemoglobin: 9.5 g/dL — ABNORMAL LOW (ref 13.0–17.0)

## 2020-11-03 LAB — SURGICAL PATHOLOGY

## 2020-11-03 LAB — MAGNESIUM: Magnesium: 1.5 mg/dL — ABNORMAL LOW (ref 1.7–2.4)

## 2020-11-03 MED ORDER — MAGNESIUM SULFATE 4 GM/100ML IV SOLN
4.0000 g | Freq: Once | INTRAVENOUS | Status: AC
  Administered 2020-11-03: 4 g via INTRAVENOUS
  Filled 2020-11-03: qty 100

## 2020-11-03 MED ORDER — PANTOPRAZOLE SODIUM 40 MG PO TBEC
40.0000 mg | DELAYED_RELEASE_TABLET | Freq: Every day | ORAL | Status: DC
  Administered 2020-11-03 – 2020-11-09 (×7): 40 mg via ORAL
  Filled 2020-11-03 (×7): qty 1

## 2020-11-03 MED ORDER — SODIUM CHLORIDE 0.9% IV SOLUTION: Freq: Once | INTRAVENOUS | Status: AC

## 2020-11-03 MED ORDER — FUROSEMIDE 10 MG/ML IJ SOLN
40.0000 mg | Freq: Once | INTRAMUSCULAR | Status: AC
  Administered 2020-11-03: 40 mg via INTRAVENOUS
  Filled 2020-11-03: qty 4

## 2020-11-03 MED ORDER — POTASSIUM CHLORIDE CRYS ER 20 MEQ PO TBCR
40.0000 meq | EXTENDED_RELEASE_TABLET | ORAL | Status: AC
  Administered 2020-11-03 (×2): 40 meq via ORAL
  Filled 2020-11-03 (×2): qty 2

## 2020-11-03 MED ORDER — FUROSEMIDE 10 MG/ML IJ SOLN: 40.0000 mg | Freq: Once | INTRAMUSCULAR | Status: DC

## 2020-11-03 NOTE — TOC Initial Note (Signed)
Transition of Care [facility]) - Initial/Assessment Note    Patient Details  Name: Paul Norton MRN: [id number] Date of Birth: 11-11-1955  Transition of Care [facility]) CM/SW Contact:    Paul Lucks, RN Phone Number: 11/03/2020, 3:24 PM  Clinical Narrative:       Patient admitted with Hypercalcemia. Patient has a High risk for readmission. Patient lives alone. Brother at the bedside. Palliative and Dr Raliegh Ip is consulting on patient. Patient has no insurance. After sickness with COVID, he lost his job and insurance.  Paul Norton- Sister in law is calling to see if patient will qualify for cobra insurance.  Per Paul Norton - brother, patient should qualify for medicaid. TOC spoke with Paul Norton. Patient is on the list for [facility] [employer] to see and start Medicaid Application.  PT is recommending SNF.  TOC to follow.             Expected Discharge Plan: Home/Self Care Barriers to Discharge: Continued Medical Work up   Patient Goals and CMS Choice Patient states their goals for this hospitalization and ongoing recovery are:: To get better CMS Medicare.gov Compare Post Acute Care list provided to:: Patient Choice offered to / list presented to : Patient  Expected Discharge Plan and Services Expected Discharge Plan: Home/Self Care       Living arrangements for the past 2 months: Single Family Home                   Prior Living Arrangements/Services Living arrangements for the past 2 months: Single Family Home Lives with:: Self Patient language and need for interpreter reviewed:: Yes        Need for Family Participation in Patient Care: Yes (Comment) Care giver support system in place?: Yes (comment)   Criminal Activity/Legal Involvement Pertinent to Current Situation/Hospitalization: No - Comment as needed  Activities of Daily Living Home Assistive Devices/Equipment: Eyeglasses ADL Screening (condition at time of admission) Patient's cognitive ability adequate to safely complete daily activities?:  Yes Is the patient deaf or have difficulty hearing?: No Does the patient have difficulty seeing, even when wearing glasses/contacts?: No Does the patient have difficulty concentrating, remembering, or making decisions?: No Patient able to express need for assistance with ADLs?: Yes Does the patient have difficulty dressing or bathing?: No Independently performs ADLs?: No Communication: Independent Dressing (OT): Needs assistance Is this a change from baseline?: Change from baseline, expected to last <3days Grooming: Needs assistance Is this a change from baseline?: Change from baseline, expected to last <3 days Feeding: Independent Bathing: Needs assistance Is this a change from baseline?: Change from baseline, expected to last <3 days Toileting: Needs assistance Is this a change from baseline?: Change from baseline, expected to last <3 days In/Out Bed: Needs assistance Is this a change from baseline?: Change from baseline, expected to last <3 days Walks in Home: Independent Does the patient have difficulty walking or climbing stairs?: No Weakness of Legs: Both Weakness of Arms/Hands: Both  Permission Sought/Granted      Share Information with NAME: Paul Norton     Permission granted to share info w Relationship: Brother     Emotional Assessment      Orientation: : Oriented to Self,Oriented to Place,Oriented to  Time,Oriented to Situation Alcohol / Substance Use: Not Applicable Psych Involvement: No (comment)  Admission diagnosis:  Hypercalcemia [E83.52] AKI (acute kidney injury) ([place]) [N17.9] Symptomatic anemia [D64.9] Metastatic malignant neoplasm, unspecified site [facility]) [C79.9] Patient Active Problem List   Diagnosis Date Noted  . Multiple myeloma  not having achieved remission ([place])   . Metastatic malignant neoplasm ([place])   . AKI (acute kidney injury) ([place]) 10/30/2020  . Symptomatic anemia 10/30/2020  . Acute metabolic encephalopathy 76/81/1572  . Hyponatremia 10/30/2020   . Moderate protein-calorie malnutrition ([place]) 10/30/2020  . Dehydration 10/30/2020  . Hypercalcemia 10/30/2020  . Abnormal CT of the head 10/30/2020  . Thrombocytopenia ([place]) 10/30/2020  . COVID-19 virus infection 09/06/2020  . Cough 09/06/2020  . DOE (dyspnea on exertion) 09/06/2020  . Abnormal SPEP 09/06/2020  . Grieving 09/06/2020  . Depressed mood 09/06/2020  . Encounter for health maintenance examination in adult 03/31/2020  . Screen for colon cancer 03/31/2020  . Screening for prostate cancer 03/31/2020  . Screening for diabetes mellitus 03/31/2020  . Hyperlipidemia 06/15/2019  . White coat syndrome with diagnosis of hypertension 06/15/2019  . Noncompliance 04/25/2018  . Essential hypertension, benign 10/10/2017  . Vaccine refused by patient 10/10/2017  . Refuses treatment 09/2017   PCP:  Paul Hurl, PA-C Pharmacy:   [facility], [place] [address] Phone: [phone] Fax: [phone]   Readmission Risk Interventions Readmission Risk Prevention Plan 11/03/2020 11/03/2020  Transportation Screening Complete -  [place] or Home Care Consult Complete Complete  Social Work Consult for [place] Planning/Counseling - Complete  Palliative Care Screening - Complete  Medication Review Press photographer) - Complete  Some recent data might be hidden

## 2020-11-03 NOTE — Progress Notes (Signed)
Palliative: [name], Norton, is sitting up in the [place] chair in his room.  He greets me making and keeping eye contact.  He is alert and oriented, able to make his basic needs known.  There is no family at bedside at this time.  Paul Norton looks improved today.  He tells me that he is feeling well, that in fact he has never really felt bad.  He states that he needs to go to the bathroom.  Nursing staff arrives at bedside to assist him.  He is independent with his mobility and ADLs.  He has had PT evaluation and would qualify for some short-term rehab but I am unsure if he would go.  His brother Paul Norton and sister-in-law Paul Norton are working to find out his insurance benefits.  Conference with attending, bedside nursing staff, transition of care team related to patient condition, needs, goals of care, disposition.  Plan:  At this point continue to treat the treatable.  Full scope/full code.  Agreeable to oncology treatment plan for acute multiple myeloma.  Short-term rehab versus home with home health.  Outpatient palliative services to follow.  25 minutes  Paul Axe, NP Palliative medicine team Team phone [phone] Greater than 50% of this time was spent counseling and coordinating care related to the above assessment and plan.

## 2020-11-03 NOTE — Progress Notes (Signed)
Nephrology Progress Note:  Subjective:  Seen earlier this am.  He feels a little stronger today and walked a couple of laps around the ICU.  Had 0.9 liters charted UOP over 4/20 and states no difficulty voiding.  Per charting hem/onc planned for cytoxan On 4/20 and dexamethasone.  Review of systems:  Denies shortness of breath or chest pain Denies n/v Ate breakfast  Objective Vital signs in last 24 hours: Vitals:   11/03/20 0744 11/03/20 0753 11/03/20 1113 11/03/20 1130  BP:   (!) 114/52 (!) 117/55  Pulse:   70 84  Resp:   20 16  Temp: (!) 97.2 F (36.2 C)  (!) 97.5 F (36.4 C) (!) 96.9 F (36.1 C)  TempSrc: Axillary  Oral Axillary  SpO2:  93% 100% 99%  Weight:      Height:       Weight change: -0.2 kg  Intake/Output Summary (Last 24 hours) at 11/03/2020 1236 Last data filed at 11/03/2020 1113 Gross per 24 hour  Intake 524.49 ml  Output 925 ml  Net -400.51 ml    Assessment/Plan: 65 year old white male with history of hypertension.  He had a positive SPEP and slightly elevated calcium in September 2021 but refused further evaluation.  He now presents with hypercalcemia, AKI, extreme anemia and extensive lytic lesions on bony skeleton  1.AKI  On CKD with creatinine of 1.23 in September, now initially over 4.  Urinalysis showing 100 of protein but no cells.  Imaging does not reveal hydronephrosis.  Given the fact that he had a positive SPEP 7 months ago and now has all the hallmarks signs of myeloma, myeloma and myeloma kidney suspected.   - Continue supportive care with IV fluids - normal saline not running on my exam and I have reordered to ensure is active - would defer lasix    2.  Probable myeloma- difficult situation. Per charting from PCP, patient in the past was not on board with preventative health care or referrals to specialists. Brother had been speaking for patient given AMS.  per hem/onc.  3. Hypertension/volume  -initially hypotensive.  Has improved with IV fluid  hydration.    4.  Hypercalcemia-with lytic bone lesions likely due to myeloma.  Initial calcium of 14 has improved with pamidronate and IV fluids  5. Anemia  -extreme and requiring transfusion. Setting of myeloma. Transfusion per primary and note plan for transfusion today  6.  Positive troponin- per charting NSTEMI felt from demand ischemia from very severe anemia.  Cardiology has been consulted  7.  Hyponatremia-  Hypovolemic hyponatremia-  Improved from admit with IVF   8. Hypokalemia - repletion for K and mag was ordered per primary      Labs: Basic Metabolic Panel: Recent Labs  Lab 11/01/20 0410 11/02/20 0406 11/03/20 0625  NA 131* 130* 130*  K 3.3* 3.5 3.0*  CL 102 103 104  CO2 24 22 21*  GLUCOSE 73 107* 139*  BUN 49* 54* 67*  CREATININE 3.66* 3.04* 2.48*  CALCIUM 9.7 8.3* 7.2*   Liver Function Tests: Recent Labs  Lab 11/01/20 0410 11/02/20 0406 11/03/20 0625  AST 36 32 29  ALT 15 15 16   ALKPHOS 48 45 47  BILITOT 0.9 1.1 1.1  PROT 9.5* 8.8* 8.9*  ALBUMIN 1.9* 1.7* 1.8*   No results for input(s): LIPASE, AMYLASE in the last 168 hours. Recent Labs  Lab 10/29/20 2307  AMMONIA 13   CBC: Recent Labs  Lab 10/29/20 2307 10/30/20 0931 10/31/20 0432  11/01/20 0410 11/02/20 0406 11/03/20 0625  WBC 5.9 5.1 7.2 5.3 4.5 6.2  NEUTROABS 3.3 3.6  --   --   --   --   HGB 3.9* 5.5* 7.7* 7.5* 7.0* 6.4*  HCT 12.0* 16.8* 22.9* 23.1* 21.3* 19.3*  MCV 121.2* 104.3* 97.9 99.6 98.2 98.0  PLT 125* 97* 93* 78* 71* 69*   Cardiac Enzymes: Recent Labs  Lab 10/29/20 2307  CKTOTAL 17*   CBG: No results for input(s): GLUCAP in the last 168 hours.  Iron Studies:  No results for input(s): IRON, TIBC, TRANSFERRIN, FERRITIN in the last 72 hours. Studies/Results: No results found. Medications: Infusions: . sodium chloride 75 mL/hr at 11/03/20 0719  . magnesium sulfate bolus IVPB 4 g (11/03/20 1202)    Scheduled Medications: . Chlorhexidine Gluconate Cloth  6 each  Topical Daily  . feeding supplement  237 mL Oral BID BM  . fluticasone furoate-vilanterol  1 puff Inhalation Daily  . furosemide  40 mg Intravenous Once  . potassium chloride  40 mEq Oral Q3H  . rosuvastatin  10 mg Oral QHS    have reviewed scheduled and prn medications.  Physical Exam: General adult male in bed in no acute distress HEENT normocephalic atraumatic extraocular movements intact sclera anicteric Neck supple trachea midline Lungs clear to auscultation bilaterally normal work of breathing at rest  Heart regular rate and rhythm no rubs or gallops appreciated Abdomen soft nontender nondistended Extremities no edema  Psych no anxiety or agitation  Neuro - tells me his name, [place], and year of 2022.  More conversant this am  Claudia Desanctis, MD 11/03/2020, 12:48 PM

## 2020-11-03 NOTE — Progress Notes (Signed)
PROGRESS NOTE   Leonel Mccollum  GYJ:[id number] DOB: Mar 04, 1956 DOA: 10/29/2020 PCP: Carlena Hurl, PA-C   Chief Complaint  Patient presents with  . Altered Mental Status   Level of care: Telemetry  Brief Admission History:  65 y.o. male, with history of HTN, HLD, and history of noncompliance with PCP advised normal presents to the ED with a chief complaint of confusion.  He was admitted with severe hypercalcemia, multiple bony lytic lesions in the calvarium and spinal column, acute renal failure, thrombocytopenia, severe anemia with hemoglobin of 3.9, severe dehydration, abnormal EKG, and severe encephalopathy.  Assessment & Plan:   Principal Problem:   Hypercalcemia Active Problems:   Essential hypertension, benign   Noncompliance   Hyperlipidemia   DOE (dyspnea on exertion)   AKI (acute kidney injury) (HCC)   Symptomatic anemia   Acute metabolic encephalopathy   Hyponatremia   Moderate protein-calorie malnutrition (HCC)   Dehydration   Abnormal CT of the head   Thrombocytopenia (HCC)   Refuses treatment   Metastatic malignant neoplasm (HCC)   Multiple myeloma not having achieved remission (HCC)   Hypercalcemia of malignancy -has lytic lesions suspect multiple myeloma -Patient presented with severe calcium elevation -Improved with IV fluids, IV pamidronate given on 4/16, calcitonin -Mentation improving -calcium levels improved (calcium is down to 7.2 , albumin is 1.8 )  Acute metabolic encephalopathy - secondary to severe hypercalcemia - Mentation improved as calcium levels improved  Severe Anemia/thrombocytopenia - concerning for underlying MDS versus multiple myeloma Hgb is back down to 6.4 after initially rising post transfusion of 4 units of PRBCs -will transfuse additional 2 units of PRBCs on 11/03/20 for a total of 6 units of PRBCs this admission  - Follow CBC - No active bleeding found, stool was guaiac negative - holding all heparin for  now -Received chemotherapy Cytoxan dose reduced to 200 mg/m2 on 11/02/2020 Platelets down to 69 K  Acute renal failure / myeloma kidney - Pt presented with severe dehydration and active untreated multiple myeloma - continue IV fluid hydration per renal team recommendations Creatinine trending down--- 4.50 >>4.05>>3.66>>3.0>>2.48 -  Renally dose medications, appreciate nephrology consultation  --Received chemotherapy Cytoxan dose reduced to 200 mg/m2 on 11/02/2020  NSTEMI  - from demand ischemia related to very severe anemia - HS troponin peaked 3336 - Pt denies chest pain - EKG with ST depression, no ST elevation seen, reviewed with cardiologist at [facility] - see Dr. Harl Bowie consult note.   Not a candidate for heparin/aspirin at this time especially given anemia requiring transfusion and thrombocytopenia   Hypomagnesemia.Hypokalemia---replace iv and orally  Hyponatremia  - from poor oral intake and severe dehydration and malignancy associated SIADH  IV fluids, follow BMP.  Vitamin B12 deficiency - Started B12 injections  Question of multiple myeloma or MDS - SPEP highly suggestive with M spike - obtain 24 hour urine for UPEP, light chains - Follow up bone marrow biopsy results from 11/01/20 Discussed with Dr. Delton Coombes --Received chemotherapy Cytoxan dose reduced to 200 mg/m2 on 11/02/2020  H/o Hyperlipidemia---Total chol is 60 , so Stop Crestor  Essential hypertension - holding meds due to soft BPs   Generalized weakness/ambulatory dysfunction--- PT eval,  May need SNF rehab   DVT prophylaxis: SCD Code Status: Full  Family Communication: Brother visited Disposition: TBD Status is: Inpatient  Remains inpatient appropriate because:Persistent severe electrolyte disturbances, IV treatments appropriate due to intensity of illness or inability to take PO and Inpatient level of care appropriate due to severity of  illness  Dispo: The patient is from: Home              Anticipated d/c  is to: TBD              Patient currently is not medically stable to d/c.   Difficult to place patient No   Consultants:   Hem/onc   cardiology  Procedures:   -Received chemotherapy Cytoxan dose reduced to 200 mg/m2 on 11/02/2020 Antimicrobials:    Subjective: Less confused Remains weak Had BM  Objective: Vitals:   11/03/20 0744 11/03/20 0753 11/03/20 1113 11/03/20 1130  BP:   (!) 114/52 (!) 117/55  Pulse:   70 84  Resp:   20 16  Temp: (!) 97.2 F (36.2 C)  (!) 97.5 F (36.4 C) (!) 96.9 F (36.1 C)  TempSrc: Axillary  Oral Axillary  SpO2:  93% 100% 99%  Weight:      Height:        Intake/Output Summary (Last 24 hours) at 11/03/2020 1237 Last data filed at 11/03/2020 1113 Gross per 24 hour  Intake 524.49 ml  Output 925 ml  Net -400.51 ml   Filed Weights   11/01/20 0500 11/02/20 0500 11/03/20 0436  Weight: 68 kg 72.6 kg 72.4 kg   Examination:  Physical Exam Gen:- Awake Alert,  In no apparent distress  HEENT:- [place].AT, No sclera icterus Nose- [place]  Neck-Supple Neck,No JVD,.  Lungs-air movement is fair, no wheezing CV- S1, S2 normal, regular Abd-  +ve B.Sounds, Abd Soft, No tenderness,    Extremity/Skin:- No  edema, good pedal pulses Psych-less confused, less disoriented Neuro-generalized weakness, no new focal deficits, no tremors   Data Reviewed: I have personally reviewed following labs and imaging studies  CBC: Recent Labs  Lab 10/29/20 2307 10/30/20 0931 10/31/20 0432 11/01/20 0410 11/02/20 0406 11/03/20 0625  WBC 5.9 5.1 7.2 5.3 4.5 6.2  NEUTROABS 3.3 3.6  --   --   --   --   HGB 3.9* 5.5* 7.7* 7.5* 7.0* 6.4*  HCT 12.0* 16.8* 22.9* 23.1* 21.3* 19.3*  MCV 121.2* 104.3* 97.9 99.6 98.2 98.0  PLT 125* 97* 93* 78* 71* 69*   Basic Metabolic Panel: Recent Labs  Lab 10/30/20 0931 10/30/20 1741 10/31/20 0432 11/01/20 0410 11/02/20 0406 11/03/20 0625  NA 130*  --  131* 131* 130* 130*  K 4.3  --  3.8 3.3* 3.5 3.0*  CL 100  --  103 102 103 104   CO2 25  --  $R'24 24 22 'xA$ 21*  GLUCOSE 135*  --  92 73 107* 139*  BUN 45*  --  48* 49* 54* 67*  CREATININE 3.96*  --  4.05* 3.66* 3.04* 2.48*  CALCIUM 12.8* 12.4* 11.5* 9.7 8.3* 7.2*  MG 1.7  --  1.5* 1.9 1.6* 1.5*    GFR: Estimated Creatinine Clearance: 30.5 mL/min (A) (by C-G formula based on SCr of 2.48 mg/dL (H)).  Liver Function Tests: Recent Labs  Lab 10/30/20 0931 10/31/20 0432 11/01/20 0410 11/02/20 0406 11/03/20 0625  AST 39 41 36 32 29  ALT $Re'18 16 15 15 16  'kyI$ ALKPHOS 59 51 48 45 47  BILITOT 0.6 0.7 0.9 1.1 1.1  PROT 11.2* 10.3* 9.5* 8.8* 8.9*  ALBUMIN 2.2* 2.0* 1.9* 1.7* 1.8*    CBG: No results for input(s): GLUCAP in the last 168 hours.  Recent Results (from the past 240 hour(s))  Resp Panel by RT-PCR (Flu A&B, Covid) Nasopharyngeal Swab     Status: None  Collection Time: 10/29/20 10:28 PM   Specimen: Nasopharyngeal Swab; Nasopharyngeal(NP) swabs in vial transport medium  Result Value Ref Range Status   SARS Coronavirus 2 by RT PCR NEGATIVE NEGATIVE Final    Comment: (NOTE) SARS-CoV-2 target nucleic acids are NOT DETECTED.  The SARS-CoV-2 RNA is generally detectable in upper respiratory specimens during the acute phase of infection. The lowest concentration of SARS-CoV-2 viral copies this assay can detect is 138 copies/mL. A negative result does not preclude SARS-Cov-2 infection and should not be used as the sole basis for treatment or other patient management decisions. A negative result may occur with  improper specimen collection/handling, submission of specimen other than nasopharyngeal swab, presence of viral mutation(s) within the areas targeted by this assay, and inadequate number of viral copies(<138 copies/mL). A negative result must be combined with clinical observations, patient history, and epidemiological information. The expected result is Negative.  Fact Sheet for Patients:  EntrepreneurPulse.com.au  Fact Sheet for  Healthcare Providers:  IncredibleEmployment.be  This test is no t yet approved or cleared by the [place] FDA and  has been authorized for detection and/or diagnosis of SARS-CoV-2 by FDA under an Emergency Use Authorization (EUA). This EUA will remain  in effect (meaning this test can be used) for the duration of the COVID-19 declaration under Section 564(b)(1) of the Act, 21 U.S.C.section 360bbb-3(b)(1), unless the authorization is terminated  or revoked sooner.       Influenza A by PCR NEGATIVE NEGATIVE Final   Influenza B by PCR NEGATIVE NEGATIVE Final    Comment: (NOTE) The Xpert Xpress SARS-CoV-2/FLU/RSV plus assay is intended as an aid in the diagnosis of influenza from Nasopharyngeal swab specimens and should not be used as a sole basis for treatment. Nasal washings and aspirates are unacceptable for Xpert Xpress SARS-CoV-2/FLU/RSV testing.  Fact Sheet for Patients: EntrepreneurPulse.com.au  Fact Sheet for Healthcare Providers: IncredibleEmployment.be  This test is not yet approved or cleared by the [place] FDA and has been authorized for detection and/or diagnosis of SARS-CoV-2 by FDA under an Emergency Use Authorization (EUA). This EUA will remain in effect (meaning this test can be used) for the duration of the COVID-19 declaration under Section 564(b)(1) of the Act, 21 U.S.C. section 360bbb-3(b)(1), unless the authorization is terminated or revoked.  Performed at [facility], [address], [place], [place] [address]   MRSA PCR Screening     Status: None   Collection Time: 10/30/20  1:25 AM   Specimen: Nasal Mucosa; Nasopharyngeal  Result Value Ref Range Status   MRSA by PCR NEGATIVE NEGATIVE Final    Comment:        The GeneXpert MRSA Assay (FDA approved for NASAL specimens only), is one component of a comprehensive MRSA colonization surveillance program. It is not intended to diagnose  MRSA infection nor to guide or monitor treatment for MRSA infections. Performed at [facility], [address] [place], [place] [address]      Radiology Studies: No results found. Scheduled Meds: . Chlorhexidine Gluconate Cloth  6 each Topical Daily  . feeding supplement  237 mL Oral BID BM  . fluticasone furoate-vilanterol  1 puff Inhalation Daily  . furosemide  40 mg Intravenous Once  . potassium chloride  40 mEq Oral Q3H  . rosuvastatin  10 mg Oral QHS   Continuous Infusions: . sodium chloride 75 mL/hr at 11/03/20 0719  . magnesium sulfate bolus IVPB 4 g (11/03/20 1202)     LOS: 4 days   Vittoria Noreen  Denton Brick, MD How to contact the [facility] Attending or Consulting provider [place] or covering provider during after hours [place], for this patient?  1. Check the care team in [facility] and look for a) attending/consulting [facility] provider listed and b) the [facility] team listed 2. Log into [email or web address] and use [place]'s universal password to access. If you do not have the password, please contact the hospital operator. 3. Locate the [facility] provider you are looking for under [facility] and page to a number that you can be directly reached. 4. If you still have difficulty reaching the provider, please page the [facility] (Director on Call) for the Hospitalists listed on amion for assistance.  11/03/2020, 12:37 PM

## 2020-11-03 NOTE — Evaluation (Signed)
Physical Therapy Evaluation Patient Details Name: Paul Norton MRN: [id number] DOB: Jan 01, 1956 Today's Date: 11/03/2020   History of Present Illness  Jadrien Narine  is a 65 y.o. male, with history of HTN, HLD, and history of noncompliance with PCP advised normal presents to the ED with a chief complaint of confusion.  Patient lives alone, nephew is closest relative and checks on him occasionally.  3 weeks ago nephew thought about bringing him into the ER, because he thought he was just not doing well, patient was alert and oriented did not want to come into the ER.  Today nephews and somebody to check on patient again and he was confused, visibly dehydrated, and nephew requested the patient be brought into the ED.  Nephew does report that about a month ago there was a questionable carbon monoxide exposure which she was not seen by provider, 2 months ago there was questionable COVID.  Unfortunately patient is confused so no further history can be obtained.  At baseline now he reports that patient is healthy, drinks a lot of water, walks a lot, and as sharp as attack.  He has no children and is not married.  His brother would technically be next of kin, but they are not close.  At the time of my exam reports he is not currently in any pain and his only complaint is dry mouth.    Clinical Impression  Patient demonstrates labored movement with frequent near falls due to leaning backwards, has to lean on nearby objects for support when attempting ambulation without AD, required use of RW for safety demonstrating slightly labored cadence without loss of balance.  Patient requires frequent verbal/tactile cueing for safety due to impulsive behavior without insight in his present limitations for functional mobility.   Patient tolerated sitting up in chair after therapy - RN notified.  Patient will benefit from continued physical therapy in hospital and recommended venue below to increase strength, balance,  endurance for safe ADLs and gait.   Follow Up Recommendations SNF;Supervision for mobility/OOB;Supervision/Assistance - 24 hour    Equipment Recommendations  None recommended by PT    Recommendations for Other Services       Precautions / Restrictions Precautions Precautions: Fall Restrictions Weight Bearing Restrictions: No      Mobility  Bed Mobility Overal bed mobility: Needs Assistance Bed Mobility: Supine to Sit;Sit to Supine     Supine to sit: Supervision Sit to supine: Supervision        Transfers Overall transfer level: Needs assistance Equipment used: None;Rolling walker (2 wheeled);1 person hand held assist Transfers: Sit to/from [facility] Sit to Stand: Min guard;Min assist Stand pivot transfers: Min guard;Min assist       General transfer comment: labored movement, very unsteady without AD, occasional leaning backwards with near falls, safer using RW  Ambulation/Gait Ambulation/Gait assistance: Min assist Gait Distance (Feet): 100 Feet Assistive device: Rolling walker (2 wheeled) Gait Pattern/deviations: Decreased step length - left;Decreased stride length;Decreased step length - right Gait velocity: decreased   General Gait Details: very unsteady on feet having to lean on nearby objects for support without AD, had to use RW for safety demonstrating slightly labored cadence without loss of balance  Stairs            Wheelchair Mobility    Modified Rankin (Stroke Patients Only)       Balance Overall balance assessment: Needs assistance Sitting-balance support: Feet supported;No upper extremity supported Sitting balance-Leahy Scale: Good Sitting balance - Comments:  seated at edge of chair and on commode   Standing balance support: During functional activity;No upper extremity supported Standing balance-Leahy Scale: Poor Standing balance comment: fair/poor without AD, fair/good using RW                              Pertinent Vitals/Pain Pain Assessment: No/denies pain    Home Living Family/patient expects to be discharged to:: Private residence Living Arrangements: Alone Available Help at Discharge: Family;Available PRN/intermittently Type of Home: Apartment Home Access: Level entry     Home Layout: One level Home Equipment: Walker - 2 wheels;Cane - single point;Crutches      Prior Function Level of Independence: Independent         Comments: Hydrographic surveyor, drives, 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        Extremity/Trunk Assessment   Upper Extremity Assessment Upper Extremity Assessment: Overall WFL for tasks assessed    Lower Extremity Assessment Lower Extremity Assessment: Generalized weakness    Cervical / Trunk Assessment Cervical / Trunk Assessment: Normal  Communication   Communication: No difficulties  Cognition Arousal/Alertness: Awake/alert Behavior During Therapy: Impulsive Overall Cognitive Status: No family/caregiver present to determine baseline cognitive functioning                                        General Comments      Exercises     Assessment/Plan    PT Assessment Patient needs continued PT services  PT Problem List Decreased strength;Decreased activity tolerance;Decreased balance;Decreased mobility       PT Treatment Interventions DME instruction;Gait training;Stair training;Functional mobility training;Therapeutic exercise;Balance training;Patient/family education    PT Goals (Current goals can be found in the Care Plan section)  Acute Rehab PT Goals Patient Stated Goal: return home with family to assist PT Goal Formulation: With patient Time For Goal Achievement: 11/17/20 Potential to Achieve Goals: Good    Frequency Min 3X/week   Barriers to discharge        Co-evaluation               AM-PAC PT "6 Clicks" Mobility  Outcome Measure Help needed turning from  your back to your side while in a flat bed without using bedrails?: None Help needed moving from lying on your back to sitting on the side of a flat bed without using bedrails?: A Little Help needed moving to and from a bed to a chair (including a wheelchair)?: A Little Help needed standing up from a chair using your arms (e.g., wheelchair or bedside chair)?: A Little Help needed to walk in hospital room?: A Little Help needed climbing 3-5 steps with a railing? : A Little 6 Click Score: 19    End of Session   Activity Tolerance: Patient tolerated treatment well;Patient limited by fatigue Patient left: in chair;with call bell/phone within reach;with chair alarm set Nurse Communication: Mobility status PT Visit Diagnosis: Unsteadiness on feet (R26.81);Other abnormalities of gait and mobility (R26.89);Muscle weakness (generalized) (M62.81)    Time: 7989-2119 PT Time Calculation (min) (ACUTE ONLY): 25 min   Charges:   PT Evaluation $PT Eval Moderate Complexity: 1 Mod PT Treatments $Therapeutic Activity: 23-37 mins        1:37 PM, 11/03/20 Lonell Grandchild, MPT Physical Therapist with [facility] [phone] office [phone] mobile phone

## 2020-11-03 NOTE — Plan of Care (Signed)
  Problem: Acute Rehab PT Goals(only PT should resolve) Goal: Pt Will Go Supine/Side To Sit Outcome: Progressing Flowsheets (Taken 11/03/2020 1342) Pt will go Supine/Side to Sit: with modified independence Goal: Patient Will Transfer Sit To/From Stand Outcome: Progressing Flowsheets (Taken 11/03/2020 1342) Patient will transfer sit to/from stand:  with modified independence  with supervision Goal: Pt Will Transfer Bed To Chair/Chair To Bed Outcome: Progressing Flowsheets (Taken 11/03/2020 1342) Pt will Transfer Bed to Chair/Chair to Bed:  with modified independence  with supervision Goal: Pt Will Ambulate Outcome: Progressing Flowsheets (Taken 11/03/2020 1342) Pt will Ambulate:  > 125 feet  with supervision  with rolling walker  with least restrictive assistive device  1:43 PM, 11/03/20 Lonell Grandchild, MPT Physical Therapist with [facility] [phone] office [phone] mobile phone

## 2020-11-04 LAB — TYPE AND SCREEN
ABO/RH(D): B POS
Antibody Screen: NEGATIVE
Unit division: 0
Unit division: 0

## 2020-11-04 LAB — CBC
HCT: 26.8 % — ABNORMAL LOW (ref 39.0–52.0)
Hemoglobin: 9.1 g/dL — ABNORMAL LOW (ref 13.0–17.0)
MCH: 31.8 pg (ref 26.0–34.0)
MCHC: 34 g/dL (ref 30.0–36.0)
MCV: 93.7 fL (ref 80.0–100.0)
Platelets: 82 10*3/uL — ABNORMAL LOW (ref 150–400)
RBC: 2.86 MIL/uL — ABNORMAL LOW (ref 4.22–5.81)
RDW: 19.9 % — ABNORMAL HIGH (ref 11.5–15.5)
WBC: 6.4 10*3/uL (ref 4.0–10.5)
nRBC: 0 % (ref 0.0–0.2)

## 2020-11-04 LAB — BPAM RBC
Blood Product Expiration Date: 202205222359
Blood Product Expiration Date: 202205232359
ISSUE DATE / TIME: 202204211056
ISSUE DATE / TIME: 202204211533
Unit Type and Rh: 1700
Unit Type and Rh: 5100

## 2020-11-04 LAB — RENAL FUNCTION PANEL
Albumin: 2 g/dL — ABNORMAL LOW (ref 3.5–5.0)
Anion gap: 5 (ref 5–15)
BUN: 67 mg/dL — ABNORMAL HIGH (ref 8–23)
CO2: 23 mmol/L (ref 22–32)
Calcium: 6.8 mg/dL — ABNORMAL LOW (ref 8.9–10.3)
Chloride: 104 mmol/L (ref 98–111)
Creatinine, Ser: 2.15 mg/dL — ABNORMAL HIGH (ref 0.61–1.24)
GFR, Estimated: 34 mL/min — ABNORMAL LOW (ref >60)
Glucose, Bld: 88 mg/dL (ref 70–99)
Phosphorus: 2.1 mg/dL — ABNORMAL LOW (ref 2.5–4.6)
Potassium: 2.5 mmol/L — CL (ref 3.5–5.1)
Sodium: 132 mmol/L — ABNORMAL LOW (ref 135–145)

## 2020-11-04 LAB — MAGNESIUM: Magnesium: 2 mg/dL (ref 1.7–2.4)

## 2020-11-04 LAB — POTASSIUM: Potassium: 3 mmol/L — ABNORMAL LOW (ref 3.5–5.1)

## 2020-11-04 LAB — PTH-RELATED PEPTIDE: PTH-related peptide: 2.1 pmol/L

## 2020-11-04 MED ORDER — POTASSIUM CHLORIDE CRYS ER 20 MEQ PO TBCR
40.0000 meq | EXTENDED_RELEASE_TABLET | ORAL | Status: AC
  Administered 2020-11-04 (×2): 40 meq via ORAL
  Filled 2020-11-04 (×2): qty 2

## 2020-11-04 MED ORDER — LACTATED RINGERS IV SOLN: INTRAVENOUS | Status: DC

## 2020-11-04 MED ORDER — POTASSIUM & SODIUM PHOSPHATES 280-160-250 MG PO PACK
1.0000 | PACK | Freq: Once | ORAL | Status: AC
  Administered 2020-11-04: 1 via ORAL
  Filled 2020-11-04: qty 1

## 2020-11-04 MED ORDER — HYDROCORTISONE ACETATE 25 MG RE SUPP
25.0000 mg | Freq: Two times a day (BID) | RECTAL | Status: DC
  Filled 2020-11-04 (×5): qty 1

## 2020-11-04 MED ORDER — POTASSIUM CHLORIDE CRYS ER 20 MEQ PO TBCR
40.0000 meq | EXTENDED_RELEASE_TABLET | ORAL | Status: AC
  Administered 2020-11-04 (×2): 40 meq via ORAL
  Filled 2020-11-04 (×3): qty 2

## 2020-11-04 MED ORDER — POTASSIUM CHLORIDE 10 MEQ/100ML IV SOLN
10.0000 meq | INTRAVENOUS | Status: AC
  Administered 2020-11-04 (×2): 10 meq via INTRAVENOUS
  Filled 2020-11-04: qty 100

## 2020-11-04 NOTE — TOC Progression Note (Signed)
Transition of Care [facility]) - Progression Note    Patient Details  Name: Paul Norton MRN: [id number] Date of Birth: 30-Jun-1956  Transition of Care [facility]) CM/SW Contact  Boneta Lucks, RN Phone Number: 11/04/2020, 4:09 PM  Clinical Narrative:   [facility] working with family to get [employer] extended. Patient will have to spend down to qualify for medicaid. [facility] agreed to take patient for charity home health, it will be end of next week for start date. MD updated.  Per [name] - brother patient can not go home, he is to unsteady. [name] is asking about private pay for SNF. TOC completed FL2 and sent out for bed offers, in the event patient agrees to pay. TOC to follow.     Expected Discharge Plan: [name] Barriers to Discharge: Barriers Resolved  Expected Discharge Plan and Services Expected Discharge Plan: [place] arrangements for the past 2 months: Single Family Home                    Readmission Risk Interventions Readmission Risk Prevention Plan 11/03/2020 11/03/2020  Transportation Screening Complete -  [place] or Home Care Consult Complete Complete  Social Work Consult for [name] Planning/Counseling - Complete  Palliative Care Screening - Complete  Medication Review Press photographer) - Complete  Some recent data might be hidden

## 2020-11-04 NOTE — Progress Notes (Signed)
Physical Therapy Treatment Patient Details Name: Gryffin Altice MRN: [id number] DOB: 1955/07/18 Today's Date: 11/04/2020    History of Present Illness Hence Derrick  is a 65 y.o. male, with history of HTN, HLD, and history of noncompliance with PCP advised normal presents to the ED with a chief complaint of confusion.  Patient lives alone, nephew is closest relative and checks on him occasionally.  3 weeks ago nephew thought about bringing him into the ER, because he thought he was just not doing well, patient was alert and oriented did not want to come into the ER.  Today nephews and somebody to check on patient again and he was confused, visibly dehydrated, and nephew requested the patient be brought into the ED.  Nephew does report that about a month ago there was a questionable carbon monoxide exposure which she was not seen by provider, 2 months ago there was questionable COVID.  Unfortunately patient is confused so no further history can be obtained.  At baseline now he reports that patient is healthy, drinks a lot of water, walks a lot, and as sharp as attack.  He has no children and is not married.  His brother would technically be next of kin, but they are not close.  At the time of my exam reports he is not currently in any pain and his only complaint is dry mouth.    PT Comments    Patient seated in chair at beginning of session. Patient completes seated exercises with good mechanics following initial cueing and demonstration. Patient transfers to standing with RW and min assist secondary to strength and balance deficits with standing. Patient ambulates with RW with intermittent unsteadiness and has poor awareness of balance deficits. Patient returned to seated in chair at end of session with friend present. Patient will benefit from continued physical therapy in hospital and recommended venue below to increase strength, balance, endurance for safe ADLs and gait.   Follow Up  Recommendations  SNF;Supervision for mobility/OOB;Supervision/Assistance - 24 hour     Equipment Recommendations  None recommended by PT    Recommendations for Other Services       Precautions / Restrictions Precautions Precautions: Fall Restrictions Weight Bearing Restrictions: No    Mobility  Bed Mobility               General bed mobility comments: seated in chair at beginning of session    Transfers Overall transfer level: Needs assistance Equipment used: None;Rolling walker (2 wheeled) Transfers: Sit to/from Stand;Stand Pivot Transfers Sit to Stand: Min guard;Min assist Stand pivot transfers: Min guard;Min assist       General transfer comment: labored movement, very unsteady without AD, safer using RW with continued unsteadiness  Ambulation/Gait Ambulation/Gait assistance: Min assist Gait Distance (Feet): 100 Feet Assistive device: Rolling walker (2 wheeled) Gait Pattern/deviations: Decreased step length - left;Decreased stride length;Decreased step length - right Gait velocity: decreased   General Gait Details: slow, labored, unsteady cadence with RW, impaired safety awareness   Stairs             Wheelchair Mobility    Modified Rankin (Stroke Patients Only)       Balance Overall balance assessment: Needs assistance Sitting-balance support: Feet supported;No upper extremity supported Sitting balance-Leahy Scale: Good Sitting balance - Comments: seated at edge of chair and on commode   Standing balance support: During functional activity;Bilateral upper extremity supported Standing balance-Leahy Scale: Fair Standing balance comment: fair using RW  Cognition Arousal/Alertness: Awake/alert Behavior During Therapy: Impulsive Overall Cognitive Status: No family/caregiver present to determine baseline cognitive functioning                                        Exercises General  Exercises - Lower Extremity Long Arc Quad: AROM;Both;15 reps;Seated Hip Flexion/Marching: AROM;Both;15 reps;Seated Toe Raises: AROM;Both;15 reps;Seated Heel Raises: AROM;Both;15 reps;Seated    General Comments        Pertinent Vitals/Pain Pain Assessment: No/denies pain    Home Living                      Prior Function            PT Goals (current goals can now be found in the care plan section) Acute Rehab PT Goals Patient Stated Goal: return home with family to assist PT Goal Formulation: With patient Time For Goal Achievement: 11/17/20 Potential to Achieve Goals: Good Progress towards PT goals: Progressing toward goals    Frequency    Min 3X/week      PT Plan Current plan remains appropriate    Co-evaluation              AM-PAC PT "6 Clicks" Mobility   Outcome Measure  Help needed turning from your back to your side while in a flat bed without using bedrails?: None Help needed moving from lying on your back to sitting on the side of a flat bed without using bedrails?: A Little Help needed moving to and from a bed to a chair (including a wheelchair)?: A Little Help needed standing up from a chair using your arms (e.g., wheelchair or bedside chair)?: A Little Help needed to walk in hospital room?: A Little Help needed climbing 3-5 steps with a railing? : A Little 6 Click Score: 19    End of Session Equipment Utilized During Treatment: Gait belt Activity Tolerance: Patient tolerated treatment well;Patient limited by fatigue Patient left: in chair;with call bell/phone within reach;with chair alarm set;with family/visitor present Nurse Communication: Mobility status PT Visit Diagnosis: Unsteadiness on feet (R26.81);Other abnormalities of gait and mobility (R26.89);Muscle weakness (generalized) (M62.81)     Time: 4166-0630 PT Time Calculation (min) (ACUTE ONLY): 28 min  Charges:  $Therapeutic Exercise: 8-22 mins $Therapeutic Activity: 8-22  mins                     11:16 AM, 11/04/20 Mearl Latin PT, DPT Physical Therapist at [facility]

## 2020-11-04 NOTE — Progress Notes (Signed)
Nephrology Progress Note:  Subjective: he had 3.1 liters UOP over 4/21.  He is seeing PT now.  Per primary he had bright red blood per rectum though he does not mention to me.  Starting to eat a little more  Review of systems:  Denies shortness of breath or chest pain Denies n/v  Objective Vital signs in last 24 hours: Vitals:   11/03/20 1815 11/03/20 1847 11/04/20 0520 11/04/20 0808  BP: (!) 150/55 114/66 121/60   Pulse: (!) 58 60 (!) 56   Resp: 15 14 17    Temp: (!) 96.4 F (35.8 C) 98.4 F (36.9 C) (!) 97.5 F (36.4 C)   TempSrc: Axillary Axillary Oral   SpO2: 98% 99% 94% 95%  Weight:   71.2 kg   Height:       Weight change: -1.2 kg  Intake/Output Summary (Last 24 hours) at 11/04/2020 0951 Last data filed at 11/04/2020 0700 Gross per 24 hour  Intake 2114.02 ml  Output 3075 ml  Net -960.98 ml    Assessment/Plan: 65 year old white male with history of hypertension.  He had a positive SPEP and slightly elevated calcium in September 2021 but refused further evaluation.  He now presents with hypercalcemia, AKI, extreme anemia and extensive lytic lesions on bony skeleton  1.AKI  On CKD with creatinine of 1.23 in September, now initially over 4.  Urinalysis showing 100 of protein but no cells.  Imaging does not reveal hydronephrosis.  Given the fact that he had a positive SPEP 7 months ago and now has all the hallmarks signs of myeloma, myeloma and myeloma kidney suspected.   - he is improving with supportive care - Continue supportive care with IV fluids - transition to LR at 50 ml/hr    2.  Probable myeloma- difficult situation. Per charting from PCP, patient in the past was not on board with preventative health care or referrals to specialists. Brother had been speaking for patient given AMS.  per hem/onc.  3. Hypertension/volume  -initially hypotensive.  Has improved with IV fluid hydration.    4.  Hypercalcemia-with lytic bone lesions likely due to myeloma.  Initial calcium  of 14 has improved with pamidronate and IV fluids; corrects to 8.4 with hypoalbuminemia and slightly low/normal  5. Anemia  -extreme (initial Hb 3.9) and requiring transfusion. Setting of myeloma. Transfusion per primary.  Improved s/p PRBC's   6.  Positive troponin- per charting NSTEMI felt from demand ischemia from very severe anemia.  Cardiology has been consulted  7.  Hyponatremia-  Hypovolemic hyponatremia-  Improved from admit with IVF   8. Hypokalemia - repletion for K was ordered per primary.  Repeat potassium at noon.  Transition to LR  9. hypophos - gentle repletion of phos.  He is also eating better  I spoke with primary team. Nephrology will sign off.  Please contact us if we can be of any assistance.     Labs: Basic Metabolic Panel: Recent Labs  Lab 11/02/20 0406 11/03/20 0625 11/04/20 0517  NA 130* 130* 132*  K 3.5 3.0* 2.5*  CL 103 104 104  CO2 22 21* 23  GLUCOSE 107* 139* 88  BUN 54* 67* 67*  CREATININE 3.04* 2.48* 2.15*  CALCIUM 8.3* 7.2* 6.8*  PHOS  --   --  2.1*   Liver Function Tests: Recent Labs  Lab 11/01/20 0410 11/02/20 0406 11/03/20 0625 11/04/20 0517  AST 36 32 29  --   ALT 15 15 16   --   Arabella Merles  48 45 47  --   BILITOT 0.9 1.1 1.1  --   PROT 9.5* 8.8* 8.9*  --   ALBUMIN 1.9* 1.7* 1.8* 2.0*   No results for input(s): LIPASE, AMYLASE in the last 168 hours. Recent Labs  Lab 10/29/20 2307  AMMONIA 13   CBC: Recent Labs  Lab 10/29/20 2307 10/30/20 0931 10/31/20 0432 11/01/20 0410 11/02/20 0406 11/03/20 0625 11/03/20 1922 11/04/20 0517  WBC 5.9 5.1 7.2 5.3 4.5 6.2  --  6.4  NEUTROABS 3.3 3.6  --   --   --   --   --   --   HGB 3.9* 5.5* 7.7* 7.5* 7.0* 6.4* 9.5* 9.1*  HCT 12.0* 16.8* 22.9* 23.1* 21.3* 19.3* 27.9* 26.8*  MCV 121.2* 104.3* 97.9 99.6 98.2 98.0  --  93.7  PLT 125* 97* 93* 78* 71* 69*  --  82*   Cardiac Enzymes: Recent Labs  Lab 10/29/20 2307  CKTOTAL 17*   CBG: No results for input(s): GLUCAP in the last 168  hours.  Iron Studies:  No results for input(s): IRON, TIBC, TRANSFERRIN, FERRITIN in the last 72 hours. Studies/Results: No results found. Medications: Infusions: . sodium chloride 120 mL/hr at 11/03/20 1612  . potassium chloride 10 mEq (11/04/20 0902)    Scheduled Medications: . Chlorhexidine Gluconate Cloth  6 each Topical Daily  . feeding supplement  237 mL Oral BID BM  . fluticasone furoate-vilanterol  1 puff Inhalation Daily  . pantoprazole  40 mg Oral Daily  . potassium chloride  40 mEq Oral Q3H    have reviewed scheduled and prn medications.  Physical Exam: General adult male in chair in no acute distress   HEENT normocephalic atraumatic extraocular movements intact sclera anicteric Neck supple trachea midline Lungs clear to auscultation bilaterally normal work of breathing at rest  Heart regular rate and rhythm no rubs or gallops appreciated Abdomen soft nontender distended Extremities no to trace edema  Psych no anxiety or agitation  Neuro - tells me his name, Paul Norton, and year of 2022. Continues to be more conversant and answers questions more readily  Claudia Desanctis, MD 11/04/2020, 10:10 AM

## 2020-11-04 NOTE — TOC Progression Note (Signed)
Transition of Care [facility]) - Progression Note    Patient Details  Name: Paul Norton MRN: [id number] Date of Birth: [birth year]  Transition of Care [facility]) CM/SW Contact  Boneta Lucks, RN Phone Number: 11/04/2020, 1:11 PM  Clinical Narrative:   [facility] meeting with patient at [place] today. Patient is in need of home health.  [facility] PT/RN.  MD advise to add to orders why RN is needed.    Expected Discharge Plan: [place] Barriers to Discharge: Barriers Resolved  Expected Discharge Plan and Services Expected Discharge Plan: [place] arrangements for the past 2 months: Single Family Home                   Readmission Risk Interventions Readmission Risk Prevention Plan 11/03/2020 11/03/2020  Transportation Screening Complete -  [place] or Home Care Consult Complete Complete  Social Work Consult for [place] Planning/Counseling - Complete  Palliative Care Screening - Complete  Medication Review Press photographer) - Complete  Some recent data might be hidden

## 2020-11-04 NOTE — Consult Note (Signed)
Referring Provider: [facility] Primary Care Physician:  Carlena Hurl, PA-C Primary Gastroenterologist:  Dr. Abbey Chatters (previously unassigned)  Date of Admission: 10/29/20 Date of Consultation: 11/04/20  Reason for Consultation:  Rectal bleeding  HPI:  Paul Norton is a 65 y.o. male with a past medical history of hyperlipidemia, hypertension, noncompliance, previously declined colonoscopy presented initially for complaints of confusion.  He was admitted with severe hypercalcemia, multiple bony lytic lesions in the calvarium and spinal column, acute renal failure, thrombocytopenia, severe anemia with hemoglobin 3.9, severe dehydration, abnormal EKG, severe encephalopathy.  Further evaluation found hypercalcemia of malignancy with lytic lesions and suspected multiple myeloma, hematology/oncology is following.  His confusion was due to hypercalcemia.  Anemia felt due to her underlying MDS versus multiple myeloma with hemoglobin initially rising after 4 units of PRBCs and then declined to 6.4.  He was given an additional 2 units on 421 for total of 6 units this admission.  He received chemotherapy Cytoxan dose on 11/02/2020.  Platelets down to 69.  Acute renal failure/myeloma kidney with creatinine trending down from initially 4.5 now 2.48 yesterday.  We were consulted because the patient apparently had bright red blood per rectum potentially yesterday and again today.  CBC this morning with hemoglobin at 9.1, stable compared to yesterday.  Apparently the patient told the hospitalist that he has a history of known hemorrhoids.  He has previously refused colonoscopy.  Today he states he's doing ok. He has been having constipation. Dr. Raliegh Ip had previously told him if he has constipation he will probably see some rectal bleeding. This morning he had a bowel movement ("first time an a month")) with straining and hard stools, known hemorrhoids. He did see some rectal bleeding at that time. Denies  significant rectal symptoms otherwise. Has not taken anthing for hemorrhoids other than warm compresses. Denies abdominal pain, N/V, other GI symptoms.  Past Medical History:  Diagnosis Date  . Colonoscopy refused 09/2017  . Hyperlipidemia   . Hypertension   . Refuses treatment 09/2017   refuses screenings such as cancer screening, refuses vaccines, refuses normal preventative care  . Vaccine refused by patient    all vaccines as of 09/2017    Past Surgical History:  Procedure Laterality Date  . COLONOSCOPY     never, declines as of 09/2017    Prior to Admission medications   Medication Sig Start Date End Date Taking? Authorizing Provider  amLODipine (NORVASC) 10 MG tablet Take 1 tablet (10 mg total) by mouth daily. 03/31/20  Yes Tysinger, Camelia Eng, PA-C  metoprolol tartrate (LOPRESSOR) 100 MG tablet Take 1 tablet (100 mg total) by mouth 2 (two) times daily. 03/31/20  Yes Tysinger, Camelia Eng, PA-C  aspirin EC 81 MG tablet Take 1 tablet (81 mg total) by mouth daily. Patient not taking: Reported on 10/31/2020 03/31/20   Tysinger, Camelia Eng, PA-C  azithromycin (ZITHROMAX) 250 MG tablet 2 tablets day 1, then 1 tablet days 2-4 Patient not taking: No sig reported 09/06/20   Carlena Hurl, PA-C  fluticasone furoate-vilanterol (BREO ELLIPTA) 200-25 MCG/INH AEPB Inhale 1 puff into the lungs daily. Patient not taking: Reported on 10/31/2020 09/06/20   Tysinger, Camelia Eng, PA-C  Multiple Vitamins-Minerals (EMERGEN-C IMMUNE PLUS) PACK Take 1 tablet by mouth 2 (two) times daily. Patient not taking: Reported on 10/31/2020 09/06/20   Carlena Hurl, PA-C  predniSONE (DELTASONE) 10 MG tablet 6/5/4/3/2/1 taper Patient not taking: No sig reported 09/06/20   Carlena Hurl, PA-C  promethazine-dextromethorphan (PROMETHAZINE-DM)  6.25-15 MG/5ML syrup Take 5 mLs by mouth 4 (four) times daily as needed for cough. Patient not taking: No sig reported 09/06/20   Carlena Hurl, PA-C  rosuvastatin (CRESTOR) 10 MG  tablet Take 1 tablet (10 mg total) by mouth at bedtime. Patient not taking: No sig reported 03/31/20 03/31/21  Tysinger, Camelia Eng, PA-C    Current Facility-Administered Medications  Medication Dose Route Frequency Provider Last Rate Last Admin  . acetaminophen (TYLENOL) tablet 650 mg  650 mg Oral Q6H PRN Zierle-Ghosh, Asia B, DO       Or  . acetaminophen (TYLENOL) suppository 650 mg  650 mg Rectal Q6H PRN Zierle-Ghosh, Asia B, DO      . Chlorhexidine Gluconate Cloth 2 % PADS 6 each  6 each Topical Daily Zierle-Ghosh, Asia B, DO   6 each at 11/03/20 1109  . feeding supplement (ENSURE ENLIVE / ENSURE PLUS) liquid 237 mL  237 mL Oral BID BM Zierle-Ghosh, Asia B, DO   237 mL at 10/31/20 1049  . fluticasone furoate-vilanterol (BREO ELLIPTA) 200-25 MCG/INH 1 puff  1 puff Inhalation Daily Zierle-Ghosh, Asia B, DO   1 puff at 11/04/20 0808  . HYDROmorphone (DILAUDID) injection 0.5 mg  0.5 mg Intravenous Q4H PRN Wynetta Emery, Clanford L, MD   0.5 mg at 10/31/20 1212  . lactated ringers infusion   Intravenous Continuous Claudia Desanctis, MD 50 mL/hr at 11/04/20 1156 New Bag at 11/04/20 1156  . ondansetron (ZOFRAN) tablet 4 mg  4 mg Oral Q6H PRN Zierle-Ghosh, Asia B, DO       Or  . ondansetron (ZOFRAN) injection 4 mg  4 mg Intravenous Q6H PRN Zierle-Ghosh, Asia B, DO   4 mg at 10/30/20 2136  . oxyCODONE (Oxy IR/ROXICODONE) immediate release tablet 5 mg  5 mg Oral Q4H PRN Zierle-Ghosh, Asia B, DO      . pantoprazole (PROTONIX) EC tablet 40 mg  40 mg Oral Daily Emokpae, Courage, MD   40 mg at 11/04/20 0910    Allergies as of 10/29/2020  . (No Known Allergies)    History reviewed. No pertinent family history.  Social History   Socioeconomic History  . Marital status: Divorced    Spouse name: Not on file  . Number of children: Not on file  . Years of education: Not on file  . Highest education level: Not on file  Occupational History  . Not on file  Tobacco Use  . Smoking status: Never Smoker  .  Smokeless tobacco: Never Used  Substance and Sexual Activity  . Alcohol use: No  . Drug use: Never  . Sexual activity: Not on file  Other Topics Concern  . Not on file  Social History Narrative  . Not on file   Social Determinants of Health   Financial Resource Strain: Not on file  Food Insecurity: Not on file  Transportation Needs: Not on file  Physical Activity: Not on file  Stress: Not on file  Social Connections: Not on file  Intimate Partner Violence: Not on file    Review of Systems: General: Negative for anorexia, weight loss, fever, chills, fatigue, weakness. ENT: Negative for hoarseness, difficulty swallowing CV: Negative for chest pain, angina, palpitations, peripheral edema.  Respiratory: Negative for dyspnea at rest, cough, sputum, wheezing.  GI: See history of present illness. Derm: Negative for rash or itching.  Endo: Negative for unusual weight change.  Heme: Negative for bruising or bleeding. Allergy: Negative for rash or hives.  Physical Exam: Vital  signs in last 24 hours: Temp:  [95.6 F (35.3 C)-98.4 F (36.9 C)] 97.5 F (36.4 C) (04/22 0520) Pulse Rate:  [55-74] 56 (04/22 0520) Resp:  [11-21] 17 (04/22 0520) BP: (114-150)/(46-66) 121/60 (04/22 0520) SpO2:  [94 %-100 %] 95 % (04/22 0808) Weight:  [71.2 kg] 71.2 kg (04/22 0520) Last BM Date:  (patient unsure of last BM) General:   Alert,  Well-developed, well-nourished, pleasant and cooperative in NAD Head:  Normocephalic and atraumatic. Eyes:  Sclera clear, no icterus. Conjunctiva pink. Neck:  Supple; no masses or thyromegaly. Lungs:  Clear throughout to auscultation. No wheezes, crackles, or rhonchi. No acute distress. Heart:  Regular rate and rhythm; no murmurs, clicks, rubs,  or gallops. Abdomen:  Soft, nontender and nondistended. No masses, hepatosplenomegaly or hernias noted. Normal bowel sounds, without guarding, and without rebound.   Rectal:  Deferred   Msk:  Symmetrical without gross  deformities. Extremities:  Without clubbing or edema. Neurologic:  Alert and  oriented x4;  grossly normal neurologically. Skin:  Intact without significant lesions or rashes. Cervical Nodes:  No significant cervical adenopathy. Psych:  Alert and cooperative. Normal mood and affect.  Intake/Output from previous day: 04/21 0701 - 04/22 0700 In: 2628.5 [P.O.:240; I.V.:1535.5; Blood:753; IV Piggyback:100] Out: 4580 [Urine:3075] Intake/Output this shift: No intake/output data recorded.  Lab Results: Recent Labs    11/02/20 0406 11/03/20 0625 11/03/20 1922 11/04/20 0517  WBC 4.5 6.2  --  6.4  HGB 7.0* 6.4* 9.5* 9.1*  HCT 21.3* 19.3* 27.9* 26.8*  PLT 71* 69*  --  82*   BMET Recent Labs    11/02/20 0406 11/03/20 0625 11/04/20 0517  NA 130* 130* 132*  K 3.5 3.0* 2.5*  CL 103 104 104  CO2 22 21* 23  GLUCOSE 107* 139* 88  BUN 54* 67* 67*  CREATININE 3.04* 2.48* 2.15*  CALCIUM 8.3* 7.2* 6.8*   LFT Recent Labs    11/02/20 0406 11/03/20 0625 11/04/20 0517  PROT 8.8* 8.9*  --   ALBUMIN 1.7* 1.8* 2.0*  AST 32 29  --   ALT 15 16  --   ALKPHOS 45 47  --   BILITOT 1.1 1.1  --    PT/INR No results for input(s): LABPROT, INR in the last 72 hours. Hepatitis Panel No results for input(s): HEPBSAG, HCVAB, HEPAIGM, HEPBIGM in the last 72 hours. C-Diff No results for input(s): CDIFFTOX in the last 72 hours.  Studies/Results: No results found.  Impression: 65 year old male with a complicated history and in quite a predicament at this point.  Multiple significant illnesses ongoing all likely related to myeloma versus MDS.  Severe anemia has been treated with 6 units PRBCs.  Hypercalcemia due to multiple lytic lesions, renal failure due to myeloma kidney.  Currently followed by oncology.  We are consulted for bright red blood per rectum.  Hemoglobin today appears stable compared to yesterday.  Plan: 1. Monitor hemoglobin 2. Anusol rectal cream for hemorrhoids up to twice a  day as needed 3. Start colace 100 mg once daily at discharge 4. Can add MiraLAX at home 1-2 times a day prn constipation 5. Transfuse as necessary 6. Monitor for significant recurrent GI bleed 7. No need for endoscopic evaluation at this time   Thank you for allowing [place] to participate in the care of Paul Hausen, DNP, AGNP-C Adult & Gerontological Nurse Practitioner [facility]   LOS: 5 days     11/04/2020, 12:52 PM

## 2020-11-04 NOTE — Progress Notes (Signed)
Assumed pt care a 0700. Pt resting comfortably in bed in no acute distress. Alert to person and place, confusion at times. Denies pain. VSS. Lungs diminished bilaterally. Bowel sounds active. LBM 4/22, slightly bloody from hemorrhoids, MD aware. Poor diet intake. No swallowing difficulties. Ambulated with PT in hallway. Call bell within reach. Will continue to monitor.

## 2020-11-04 NOTE — Progress Notes (Signed)
Date and time results received: 11/04/20 at 0630.    Test: K+ Critical Value: 2.5  Name of Provider Notified: Denton Brick and [place]   Orders Received? Or Actions Taken?:No new orders before this RN's end of shift. On coming RN, Blanch Media, notified.

## 2020-11-04 NOTE — NC FL2 (Signed)
[place] LEVEL OF CARE SCREENING TOOL     IDENTIFICATION  Patient Name: Paul Norton Birthdate: [birth year] Sex: male Admission Date (Current Location): 10/29/2020  [facility] and [place] Number:  [employer] and Address:         Provider Number: [id number]  Attending Physician Name and Address:  Roxan Hockey, MD  Relative Name and Phone Number:  [name] - [phone]    Current Level of Care: Hospital Recommended Level of Care: [place] Prior Approval Number:    Date Approved/Denied:   PASRR Number: [id number]  Discharge Plan: SNF    Current Diagnoses: Patient Active Problem List   Diagnosis Date Noted  . Multiple myeloma not having achieved remission ([place])   . Metastatic malignant neoplasm ([place])   . AKI (acute kidney injury) ([place]) 10/30/2020  . Symptomatic anemia 10/30/2020  . Acute metabolic encephalopathy 33/54/5625  . Hyponatremia 10/30/2020  . Moderate protein-calorie malnutrition ([place]) 10/30/2020  . Dehydration 10/30/2020  . Hypercalcemia 10/30/2020  . Abnormal CT of the head 10/30/2020  . Thrombocytopenia ([place]) 10/30/2020  . COVID-19 virus infection 09/06/2020  . Cough 09/06/2020  . DOE (dyspnea on exertion) 09/06/2020  . Abnormal SPEP 09/06/2020  . Grieving 09/06/2020  . Depressed mood 09/06/2020  . Encounter for health maintenance examination in adult 03/31/2020  . Screen for colon cancer 03/31/2020  . Screening for prostate cancer 03/31/2020  . Screening for diabetes mellitus 03/31/2020  . Hyperlipidemia 06/15/2019  . White coat syndrome with diagnosis of hypertension 06/15/2019  . Noncompliance 04/25/2018  . Essential hypertension, benign 10/10/2017  . Vaccine refused by patient 10/10/2017  . Refuses treatment 09/2017    Orientation RESPIRATION BLADDER Height & Weight     Self,Time,Situation,Place  Normal Continent Weight: 71.2 kg Height:  $Remove'5\' 9"'gPRyIvo$  (175.3 cm)  BEHAVIORAL SYMPTOMS/MOOD  NEUROLOGICAL BOWEL NUTRITION STATUS      Continent Diet (See Dc summary)  AMBULATORY STATUS COMMUNICATION OF NEEDS Skin   Extensive Assist Verbally Normal                       Personal Care Assistance Level of Assistance  Bathing,Feeding,Dressing Bathing Assistance: Limited assistance Feeding assistance: Independent Dressing Assistance: Limited assistance     Functional Limitations Info  Sight,Hearing,Speech Sight Info: Adequate Hearing Info: Adequate Speech Info: Adequate    SPECIAL CARE FACTORS FREQUENCY  PT (By licensed PT)     PT Frequency: 5 times a week              Contractures Contractures Info: Not present    Additional Factors Info  Code Status,Allergies Code Status Info: FULL Allergies Info: NKDA           Current Medications (11/04/2020):  This is the current hospital active medication list Current Facility-Administered Medications  Medication Dose Route Frequency Provider Last Rate Last Admin  . acetaminophen (TYLENOL) tablet 650 mg  650 mg Oral Q6H PRN Zierle-Ghosh, Asia B, DO       Or  . acetaminophen (TYLENOL) suppository 650 mg  650 mg Rectal Q6H PRN Zierle-Ghosh, Asia B, DO      . Chlorhexidine Gluconate Cloth 2 % PADS 6 each  6 each Topical Daily Zierle-Ghosh, Asia B, DO   6 each at 11/03/20 1109  . feeding supplement (ENSURE ENLIVE / ENSURE PLUS) liquid 237 mL  237 mL Oral BID BM Zierle-Ghosh, Asia B, DO   237 mL at 10/31/20 1049  . fluticasone furoate-vilanterol (BREO ELLIPTA) 200-25 MCG/INH 1  puff  1 puff Inhalation Daily Zierle-Ghosh, Asia B, DO   1 puff at 11/04/20 0808  . HYDROmorphone (DILAUDID) injection 0.5 mg  0.5 mg Intravenous Q4H PRN Wynetta Emery, Clanford L, MD   0.5 mg at 10/31/20 1212  . lactated ringers infusion   Intravenous Continuous Claudia Desanctis, MD 50 mL/hr at 11/04/20 1156 New Bag at 11/04/20 1156  . ondansetron (ZOFRAN) tablet 4 mg  4 mg Oral Q6H PRN Zierle-Ghosh, Asia B, DO       Or  . ondansetron (ZOFRAN) injection 4  mg  4 mg Intravenous Q6H PRN Zierle-Ghosh, Asia B, DO   4 mg at 10/30/20 2136  . oxyCODONE (Oxy IR/ROXICODONE) immediate release tablet 5 mg  5 mg Oral Q4H PRN Zierle-Ghosh, Asia B, DO      . pantoprazole (PROTONIX) EC tablet 40 mg  40 mg Oral Daily Emokpae, Courage, MD   40 mg at 11/04/20 0910  . potassium chloride SA (KLOR-CON) CR tablet 40 mEq  40 mEq Oral Q3H Emokpae, Courage, MD         Discharge Medications: Please see discharge summary for a list of discharge medications.  Relevant Imaging Results:  Relevant Lab Results:   Additional Information SS# [id number]  Boneta Lucks, RN

## 2020-11-04 NOTE — Progress Notes (Signed)
PROGRESS NOTE   Paul Norton  QIO:[id number] DOB: 09/25/1955 DOA: 10/29/2020 PCP: Paul Hurl, PA-C   Chief Complaint  Patient presents with  . Altered Mental Status   Level of care: Telemetry  Brief Admission History:  65 y.o. male, with history of HTN, HLD, and history of noncompliance with PCP advised normal presents to the ED with a chief complaint of confusion.  He was admitted with severe hypercalcemia, multiple bony lytic lesions in the calvarium and spinal column, acute renal failure, thrombocytopenia, severe anemia with hemoglobin of 3.9, severe dehydration, abnormal EKG, and severe encephalopathy.  Assessment & Plan:   Principal Problem:   Hypercalcemia Active Problems:   Essential hypertension, benign   Noncompliance   Hyperlipidemia   DOE (dyspnea on exertion)   AKI (acute kidney injury) (HCC)   Symptomatic anemia   Acute metabolic encephalopathy   Hyponatremia   Moderate protein-calorie malnutrition (HCC)   Dehydration   Abnormal CT of the head   Thrombocytopenia (HCC)   Refuses treatment   Metastatic malignant neoplasm (HCC)   Multiple myeloma not having achieved remission (HCC)   Hypercalcemia of Malignancy -has lytic lesions suspect multiple myeloma -Patient presented with severe calcium elevation -Improved with IV fluids, IV pamidronate given on 4/16, calcitonin -Mentation improving -calcium levels improved (calcium is down to 6.8 , albumin is 2.0 )  Acute metabolic encephalopathy - secondary to severe hypercalcemia - Mentation improved as calcium levels improved- -patient still has episodes of confusion or disorientation but overall improved  Severe Anemia/thrombocytopenia--due to multiple myeloma - total of 6 units of PRBCs transfused this admission  - Follow CBC - No active bleeding found, stool was guaiac negative - holding all heparin for now -Received chemotherapy Cytoxan dose reduced to 200 mg/m2 on 11/02/2020  Acute renal  failure / myeloma kidney - Pt presented with severe dehydration and active untreated multiple myeloma -Did well with IV hydration Creatinine trending down--- 4.50 >>4.05>>3.66>>3.0>>2.48>2.15 -  Renally dose medications, appreciate nephrology consultation  --Received chemotherapy Cytoxan dose reduced to 200 mg/m2 on 11/02/2020  NSTEMI  - from demand ischemia related to very severe anemia - HS troponin peaked 3336 - Pt denies chest pain - EKG with ST depression, no ST elevation seen, reviewed with cardiologist at [facility] - see Dr. Harl Norton consult note.   Not a candidate for heparin/aspirin at this time especially given anemia requiring transfusion and thrombocytopenia   Hypomagnesemia.Hypokalemia--- recurrent episodes of hypokalemia and hypomagnesemia continue to replace and recheck  Hyponatremia  - from poor oral intake and severe dehydration and malignancy associated SIADH Improved with IV fluids-- follow BMP.  Vitamin B12 deficiency - Started B12 injections  Question of multiple myeloma or MDS - SPEP highly suggestive with M spike - obtain 24 hour urine for UPEP, light chains - Follow up bone marrow biopsy results from 11/01/20 Discussed with Dr. Delton Norton --Received chemotherapy Cytoxan dose reduced to 200 mg/m2 on 11/02/2020  H/o Hyperlipidemia---Total chol is 57 , so Stop Crestor  Essential hypertension - holding meds due to soft BPs   BRBPR--- -Had bloody BM most likely from hemorrhoids -GI input requested, recommends Anusol [facility]  Generalized weakness/ambulatory dysfunction--- PT eval,  May need SNF rehab,   Social/Ethics--discussed with patient and patient's brother Paul Norton, patient is a full code, -Patient has no payer source to go to rehab -Brother is concerned that he is too weak to go home with home health even if we get a charity home health for him -Patient appears to need 24/7 supervision  at this time   DVT prophylaxis: SCD Code Status: Full  Family Communication:  Brother Paul Norton from [place] Disposition: TBD Status is: Inpatient  Remains inpatient appropriate because:Persistent severe electrolyte disturbances, IV treatments appropriate due to intensity of illness or inability to take PO and Inpatient level of care appropriate due to severity of illness  Dispo: The patient is from: Home              Anticipated d/c is to: TBD              Patient currently is not medically stable to d/c.   Difficult to place patient No   Consultants:   Hem/onc   cardiology  Procedures:   -Received chemotherapy Cytoxan dose reduced to 200 mg/m2 on 11/02/2020 Antimicrobials:    Subjective: -Had bloody BM most likely from hemorrhoids -Oral intake is fair  Objective: Vitals:   11/03/20 1815 11/03/20 1847 11/04/20 0520 11/04/20 0808  BP: (!) 150/55 114/66 121/60   Pulse: (!) 58 60 (!) 56   Resp: $Remo'15 14 17   'ozAVB$ Temp: (!) 96.4 F (35.8 C) 98.4 F (36.9 C) (!) 97.5 F (36.4 C)   TempSrc: Axillary Axillary Oral   SpO2: 98% 99% 94% 95%  Weight:   71.2 kg   Height:        Intake/Output Summary (Last 24 hours) at 11/04/2020 1818 Last data filed at 11/04/2020 0700 Gross per 24 hour  Intake 573.52 ml  Output 1625 ml  Net -1051.48 ml   Filed Weights   11/02/20 0500 11/03/20 0436 11/04/20 0520  Weight: 72.6 kg 72.4 kg 71.2 kg   Examination:  Physical Exam Gen:- Awake Alert,  In no apparent distress  HEENT:- [place].AT, No sclera icterus Nose- [place]  Neck-Supple Neck,No JVD,.  Lungs-air movement is fair, no wheezing CV- S1, S2 normal, regular Abd-  +ve B.Sounds, Abd Soft, No tenderness,    Extremity/Skin:- No  edema, good pedal pulses Psych-less confused, less disoriented Neuro-generalized weakness, no new focal deficits, no tremors   Data Reviewed: I have personally reviewed following labs and imaging studies  CBC: Recent Labs  Lab 10/29/20 2307 10/30/20 0931 10/31/20 0432 11/01/20 0410 11/02/20 0406 11/03/20 0625 11/03/20 1922 11/04/20 0517  WBC  5.9 5.1 7.2 5.3 4.5 6.2  --  6.4  NEUTROABS 3.3 3.6  --   --   --   --   --   --   HGB 3.9* 5.5* 7.7* 7.5* 7.0* 6.4* 9.5* 9.1*  HCT 12.0* 16.8* 22.9* 23.1* 21.3* 19.3* 27.9* 26.8*  MCV 121.2* 104.3* 97.9 99.6 98.2 98.0  --  93.7  PLT 125* 97* 93* 78* 71* 69*  --  82*   Basic Metabolic Panel: Recent Labs  Lab 10/31/20 0432 11/01/20 0410 11/02/20 0406 11/03/20 0625 11/04/20 0517 11/04/20 1306  NA 131* 131* 130* 130* 132*  --   K 3.8 3.3* 3.5 3.0* 2.5* 3.0*  CL 103 102 103 104 104  --   CO2 $Re'24 24 22 'HxB$ 21* 23  --   GLUCOSE 92 73 107* 139* 88  --   BUN 48* 49* 54* 67* 67*  --   CREATININE 4.05* 3.66* 3.04* 2.48* 2.15*  --   CALCIUM 11.5* 9.7 8.3* 7.2* 6.8*  --   MG 1.5* 1.9 1.6* 1.5* 2.0  --   PHOS  --   --   --   --  2.1*  --     GFR: Estimated Creatinine Clearance: 35.2 mL/min (A) (by C-G formula based  on SCr of 2.15 mg/dL (H)).  Liver Function Tests: Recent Labs  Lab 10/30/20 0931 10/31/20 0432 11/01/20 0410 11/02/20 0406 11/03/20 0625 11/04/20 0517  AST 39 41 36 32 29  --   ALT $Re'18 16 15 15 16  'FyD$ --   ALKPHOS 59 51 48 45 47  --   BILITOT 0.6 0.7 0.9 1.1 1.1  --   PROT 11.2* 10.3* 9.5* 8.8* 8.9*  --   ALBUMIN 2.2* 2.0* 1.9* 1.7* 1.8* 2.0*    CBG: No results for input(s): GLUCAP in the last 168 hours.  Recent Results (from the past 240 hour(s))  Resp Panel by RT-PCR (Flu A&B, Covid) Nasopharyngeal Swab     Status: None   Collection Time: 10/29/20 10:28 PM   Specimen: Nasopharyngeal Swab; Nasopharyngeal(NP) swabs in vial transport medium  Result Value Ref Range Status   SARS Coronavirus 2 by RT PCR NEGATIVE NEGATIVE Final    Comment: (NOTE) SARS-CoV-2 target nucleic acids are NOT DETECTED.  The SARS-CoV-2 RNA is generally detectable in upper respiratory specimens during the acute phase of infection. The lowest concentration of SARS-CoV-2 viral copies this assay can detect is 138 copies/mL. A negative result does not preclude SARS-Cov-2 infection and should not  be used as the sole basis for treatment or other patient management decisions. A negative result may occur with  improper specimen collection/handling, submission of specimen other than nasopharyngeal swab, presence of viral mutation(s) within the areas targeted by this assay, and inadequate number of viral copies(<138 copies/mL). A negative result must be combined with clinical observations, patient history, and epidemiological information. The expected result is Negative.  Fact Sheet for Patients:  EntrepreneurPulse.com.au  Fact Sheet for Healthcare Providers:  IncredibleEmployment.be  This test is no t yet approved or cleared by the [place] FDA and  has been authorized for detection and/or diagnosis of SARS-CoV-2 by FDA under an Emergency Use Authorization (EUA). This EUA will remain  in effect (meaning this test can be used) for the duration of the COVID-19 declaration under Section 564(b)(1) of the Act, 21 U.S.C.section 360bbb-3(b)(1), unless the authorization is terminated  or revoked sooner.       Influenza A by PCR NEGATIVE NEGATIVE Final   Influenza B by PCR NEGATIVE NEGATIVE Final    Comment: (NOTE) The Xpert Xpress SARS-CoV-2/FLU/RSV plus assay is intended as an aid in the diagnosis of influenza from Nasopharyngeal swab specimens and should not be used as a sole basis for treatment. Nasal washings and aspirates are unacceptable for Xpert Xpress SARS-CoV-2/FLU/RSV testing.  Fact Sheet for Patients: EntrepreneurPulse.com.au  Fact Sheet for Healthcare Providers: IncredibleEmployment.be  This test is not yet approved or cleared by the [place] FDA and has been authorized for detection and/or diagnosis of SARS-CoV-2 by FDA under an Emergency Use Authorization (EUA). This EUA will remain in effect (meaning this test can be used) for the duration of the COVID-19 declaration under Section  564(b)(1) of the Act, 21 U.S.C. section 360bbb-3(b)(1), unless the authorization is terminated or revoked.  Performed at [facility], [address] [place], [place] [address]   MRSA PCR Screening     Status: None   Collection Time: 10/30/20  1:25 AM   Specimen: Nasal Mucosa; Nasopharyngeal  Result Value Ref Range Status   MRSA by PCR NEGATIVE NEGATIVE Final    Comment:        The GeneXpert MRSA Assay (FDA approved for NASAL specimens only), is one component of a comprehensive MRSA colonization surveillance program. It  is not intended to diagnose MRSA infection nor to guide or monitor treatment for MRSA infections. Performed at [facility], [address] [place], [place] [address]      Radiology Studies: No results found. Scheduled Meds: . Chlorhexidine Gluconate Cloth  6 each Topical Daily  . feeding supplement  237 mL Oral BID BM  . fluticasone furoate-vilanterol  1 puff Inhalation Daily  . pantoprazole  40 mg Oral Daily  . potassium chloride  40 mEq Oral Q3H   Continuous Infusions: . lactated ringers 50 mL/hr at 11/04/20 1156     LOS: 5 days   Roxan Hockey, MD How to contact the [facility] Attending or Consulting provider [place] or covering provider during after hours [place], for this patient?  1. Check the care team in [facility] and look for a) attending/consulting TRH provider listed and b) the [facility] team listed 2. Log into [email or web address] and use [place]'s universal password to access. If you do not have the password, please contact the hospital operator. 3. Locate the [facility] provider you are looking for under [facility] and page to a number that you can be directly reached. 4. If you still have difficulty reaching the provider, please page the [facility] (Director on Call) for the Hospitalists listed on amion for assistance.  11/04/2020, 6:18 PM

## 2020-11-05 LAB — RENAL FUNCTION PANEL
Albumin: 1.8 g/dL — ABNORMAL LOW (ref 3.5–5.0)
Anion gap: 5 (ref 5–15)
BUN: 50 mg/dL — ABNORMAL HIGH (ref 8–23)
CO2: 22 mmol/L (ref 22–32)
Calcium: 6.3 mg/dL — CL (ref 8.9–10.3)
Chloride: 105 mmol/L (ref 98–111)
Creatinine, Ser: 1.81 mg/dL — ABNORMAL HIGH (ref 0.61–1.24)
GFR, Estimated: 41 mL/min — ABNORMAL LOW (ref >60)
Glucose, Bld: 80 mg/dL (ref 70–99)
Phosphorus: 1.9 mg/dL — ABNORMAL LOW (ref 2.5–4.6)
Potassium: 2.9 mmol/L — ABNORMAL LOW (ref 3.5–5.1)
Sodium: 132 mmol/L — ABNORMAL LOW (ref 135–145)

## 2020-11-05 LAB — CBC
HCT: 26.7 % — ABNORMAL LOW (ref 39.0–52.0)
Hemoglobin: 8.9 g/dL — ABNORMAL LOW (ref 13.0–17.0)
MCH: 31.4 pg (ref 26.0–34.0)
MCHC: 33.3 g/dL (ref 30.0–36.0)
MCV: 94.3 fL (ref 80.0–100.0)
Platelets: 81 10*3/uL — ABNORMAL LOW (ref 150–400)
RBC: 2.83 MIL/uL — ABNORMAL LOW (ref 4.22–5.81)
RDW: 20 % — ABNORMAL HIGH (ref 11.5–15.5)
WBC: 6.1 10*3/uL (ref 4.0–10.5)
nRBC: 0 % (ref 0.0–0.2)

## 2020-11-05 MED ORDER — DOCUSATE SODIUM 100 MG PO CAPS
200.0000 mg | ORAL_CAPSULE | Freq: Every day | ORAL | Status: DC
  Filled 2020-11-05 (×2): qty 2

## 2020-11-05 MED ORDER — K PHOS MONO-SOD PHOS DI & MONO 155-852-130 MG PO TABS
250.0000 mg | ORAL_TABLET | Freq: Three times a day (TID) | ORAL | Status: AC
  Administered 2020-11-05 – 2020-11-07 (×9): 250 mg via ORAL
  Filled 2020-11-05 (×9): qty 1

## 2020-11-05 MED ORDER — POLYETHYLENE GLYCOL 3350 17 G PO PACK
17.0000 g | PACK | Freq: Every day | ORAL | Status: DC
  Filled 2020-11-05 (×2): qty 1

## 2020-11-05 MED ORDER — POTASSIUM CHLORIDE CRYS ER 20 MEQ PO TBCR
40.0000 meq | EXTENDED_RELEASE_TABLET | ORAL | Status: AC
  Administered 2020-11-05 (×2): 40 meq via ORAL
  Filled 2020-11-05 (×2): qty 2

## 2020-11-05 NOTE — Progress Notes (Signed)
PROGRESS NOTE   Paul Norton  ERD:[id number] DOB: 1956/04/19 DOA: 10/29/2020 PCP: Carlena Hurl, PA-C   Chief Complaint  Patient presents with  . Altered Mental Status   Level of care: Telemetry  Brief Admission History:  65 y.o. male, with history of HTN, HLD, and history of noncompliance with PCP advised normal presents to the ED with a chief complaint of confusion.  He was admitted with severe hypercalcemia, multiple bony lytic lesions in the calvarium and spinal column, acute renal failure, thrombocytopenia, severe anemia with hemoglobin of 3.9, severe dehydration, abnormal EKG, and severe encephalopathy.  Assessment & Plan:   Principal Problem:   Hypercalcemia Active Problems:   Essential hypertension, benign   Noncompliance   Hyperlipidemia   DOE (dyspnea on exertion)   AKI (acute kidney injury) (HCC)   Symptomatic anemia   Acute metabolic encephalopathy   Hyponatremia   Moderate protein-calorie malnutrition (HCC)   Dehydration   Abnormal CT of the head   Thrombocytopenia (HCC)   Refuses treatment   Metastatic malignant neoplasm (HCC)   Multiple myeloma not having achieved remission (HCC)   Hypercalcemia of Malignancy -has lytic lesions due to multiple myeloma -Patient presented with severe calcium elevation -Improved with IV fluids, IV pamidronate given on 4/16, calcitonin -Mentation continues to improve  -calcium levels improved -even when corrected for low albumin patient is no longer hypercalcemic  Acute metabolic encephalopathy - secondary to severe hypercalcemia - Mentation improved as calcium levels improved- -patient still has episodes of confusion or disorientation but overall mentation continues to improve  Severe Anemia/thrombocytopenia--due to multiple myeloma - total of 6 units of PRBCs transfused this admission  - Follow CBC - No active bleeding found, stool was guaiac negative - holding all heparin for now -Received chemotherapy  Cytoxan dose reduced to 200 mg/m2 on 11/02/2020  Acute renal failure / myeloma kidney - Pt presented with severe dehydration and active untreated multiple myeloma -Did well with IV hydration Creatinine trending down--- 4.50 >>4.05>>3.66>>3.0>>2.48>2.15>1.81 -  Renally dose medications, appreciate nephrology consultation  --Received chemotherapy Cytoxan dose reduced to 200 mg/m2 on 11/02/2020  NSTEMI  - from demand ischemia related to very severe anemia - HS troponin peaked 3336 - Pt denies chest pain - EKG with ST depression, no ST elevation seen, reviewed with cardiologist at [facility] -Please see Dr. Harl Bowie consult note.   Not a candidate for heparin/aspirin at this time especially given anemia requiring transfusion and thrombocytopenia   Hypomagnesemia.Hypokalemia/hypophosphatemia --- recurrent episodes of hypokalemia and hypomagnesemia continue to replace and recheck  Hyponatremia  - from poor oral intake and severe dehydration and malignancy associated SIADH Improved with IV fluids-- follow BMP.  Vitamin B12 deficiency -  B12 injections   multiple myeloma -bone marrow biopsy confirmed - SPEP highly suggestive with M spike Discussed with Dr. Delton Coombes --Received chemotherapy Cytoxan dose reduced to 200 mg/m2 on 11/02/2020  H/o Hyperlipidemia---Total chol is 57 , so Stopped Crestor  Essential hypertension - holding meds due to soft BPs   BRBPR--- -Had bloody BM most likely from hemorrhoids -GI input requested, recommends Anusol HC  Generalized weakness/ambulatory dysfunction--- PT eval, recommends SNF rehab,   Social/Ethics--discussed with patient and patient's brother [name], - patient is a full code, -Patient has no payer source to go to SNF rehab -Brother is concerned that he is too weak to go home with home health even if we get a charity home health for him -Patient appears to need 24/7 supervision at this time -Patient and brother looking into  possibly paying out-of-pocket  for SNF rehab if he cannot get his [employer] from previous employer reinstated   DVT prophylaxis: SCD Code Status: Full  Family Communication: Brother Doctor, general practice from [place] Disposition: TBD Status is: Inpatient  Remains inpatient appropriate because:Persistent severe electrolyte disturbances, IV treatments appropriate due to intensity of illness or inability to take PO and Inpatient level of care appropriate due to severity of illness  Dispo: The patient is from: Home              Anticipated d/c is to: TBD              Patient currently is not medically stable to d/c.   Difficult to place patient No   Consultants:   Hem/onc   cardiology  Procedures:   -Received chemotherapy Cytoxan dose reduced to 200 mg/m2 on 11/02/2020 Antimicrobials:    Subjective: -Tolerating oral intake, brother from [place] at bedside -No further bloody BM  Objective: Vitals:   11/04/20 0808 11/04/20 2305 11/05/20 0500 11/05/20 0759  BP:  119/67 130/68   Pulse:  71 80   Resp:  20 18   Temp:  98.1 F (36.7 C) 98.5 F (36.9 C)   TempSrc:  Oral Oral   SpO2: 95% 96% 96% 97%  Weight:   69.9 kg   Height:        Intake/Output Summary (Last 24 hours) at 11/05/2020 1321 Last data filed at 11/05/2020 0700 Gross per 24 hour  Intake 2032.13 ml  Output 1200 ml  Net 832.13 ml   Filed Weights   11/03/20 0436 11/04/20 0520 11/05/20 0500  Weight: 72.4 kg 71.2 kg 69.9 kg   Examination:  Physical Exam Gen:- Awake Alert,  In no apparent distress  HEENT:- [place].AT, No sclera icterus Nose-   Neck-Supple Neck,No JVD,.  Lungs-air movement is fair, no wheezing CV- S1, S2 normal, regular Abd-  +ve B.Sounds, Abd Soft, No tenderness,    Extremity/Skin:- No  edema, good pedal pulses Psych-more coherent, affect is flat Neuro-generalized weakness, no new focal deficits, no tremors   Data Reviewed: I have personally reviewed following labs and imaging studies  CBC: Recent Labs  Lab 10/29/20 2307  10/30/20 0931 10/31/20 0432 11/01/20 0410 11/02/20 0406 11/03/20 0625 11/03/20 1922 11/04/20 0517 11/05/20 0451  WBC 5.9 5.1   < > 5.3 4.5 6.2  --  6.4 6.1  NEUTROABS 3.3 3.6  --   --   --   --   --   --   --   HGB 3.9* 5.5*   < > 7.5* 7.0* 6.4* 9.5* 9.1* 8.9*  HCT 12.0* 16.8*   < > 23.1* 21.3* 19.3* 27.9* 26.8* 26.7*  MCV 121.2* 104.3*   < > 99.6 98.2 98.0  --  93.7 94.3  PLT 125* 97*   < > 78* 71* 69*  --  82* 81*   < > = values in this interval not displayed.   Basic Metabolic Panel: Recent Labs  Lab 10/31/20 0432 11/01/20 0410 11/02/20 0406 11/03/20 0625 11/04/20 0517 11/04/20 1306 11/05/20 0451  NA 131* 131* 130* 130* 132*  --  132*  K 3.8 3.3* 3.5 3.0* 2.5* 3.0* 2.9*  CL 103 102 103 104 104  --  105  CO2 _0 21* 23  --  22  GLUCOSE 92 73 107* 139* 88  --  80  BUN 48* 49* 54* 67* 67*  --  50*  CREATININE 4.05* 3.66* 3.04* 2.48* 2.15*  --  1.81*  CALCIUM 11.5* 9.7 8.3* 7.2* 6.8*  --  6.3*  MG 1.5* 1.9 1.6* 1.5* 2.0  --   --   PHOS  --   --   --   --  2.1*  --  1.9*    GFR: Estimated Creatinine Clearance: 41.3 mL/min (A) (by C-G formula based on SCr of 1.81 mg/dL (H)).  Liver Function Tests: Recent Labs  Lab 10/30/20 0931 10/31/20 0432 11/01/20 0410 11/02/20 0406 11/03/20 0625 11/04/20 0517 11/05/20 0451  AST 39 41 36 32 29  --   --   ALT _0 --   --   ALKPHOS 59 51 48 45 47  --   --   BILITOT 0.6 0.7 0.9 1.1 1.1  --   --   PROT 11.2* 10.3* 9.5* 8.8* 8.9*  --   --   ALBUMIN 2.2* 2.0* 1.9* 1.7* 1.8* 2.0* 1.8*    CBG: No results for input(s): GLUCAP in the last 168 hours.  Recent Results (from the past 240 hour(s))  Resp Panel by RT-PCR (Flu A&B, Covid) Nasopharyngeal Swab     Status: None   Collection Time: 10/29/20 10:28 PM   Specimen: Nasopharyngeal Swab; Nasopharyngeal(NP) swabs in vial transport medium  Result Value Ref Range Status   SARS Coronavirus 2 by RT PCR NEGATIVE NEGATIVE Final    Comment: (NOTE) SARS-CoV-2 target  nucleic acids are NOT DETECTED.  The SARS-CoV-2 RNA is generally detectable in upper respiratory specimens during the acute phase of infection. The lowest concentration of SARS-CoV-2 viral copies this assay can detect is 138 copies/mL. A negative result does not preclude SARS-Cov-2 infection and should not be used as the sole basis for treatment or other patient management decisions. A negative result may occur with  improper specimen collection/handling, submission of specimen other than nasopharyngeal swab, presence of viral mutation(s) within the areas targeted by this assay, and inadequate number of viral copies(<138 copies/mL). A negative result must be combined with clinical observations, patient history, and epidemiological information. The expected result is Negative.  Fact Sheet for Patients:  EntrepreneurPulse.com.au  Fact Sheet for Healthcare Providers:  IncredibleEmployment.be  This test is no t yet approved or cleared by the [place] FDA and  has been authorized for detection and/or diagnosis of SARS-CoV-2 by FDA under an Emergency Use Authorization (EUA). This EUA will remain  in effect (meaning this test can be used) for the duration of the COVID-19 declaration under Section 564(b)(1) of the Act, 21 U.S.C.section 360bbb-3(b)(1), unless the authorization is terminated  or revoked sooner.       Influenza A by PCR NEGATIVE NEGATIVE Final   Influenza B by PCR NEGATIVE NEGATIVE Final    Comment: (NOTE) The Xpert Xpress SARS-CoV-2/FLU/RSV plus assay is intended as an aid in the diagnosis of influenza from Nasopharyngeal swab specimens and should not be used as a sole basis for treatment. Nasal washings and aspirates are unacceptable for Xpert Xpress SARS-CoV-2/FLU/RSV testing.  Fact Sheet for Patients: EntrepreneurPulse.com.au  Fact Sheet for Healthcare  Providers: IncredibleEmployment.be  This test is not yet approved or cleared by the [place] FDA and has been authorized for detection and/or diagnosis of SARS-CoV-2 by FDA under an Emergency Use Authorization (EUA). This EUA will remain in effect (meaning this test can be used) for the duration of the COVID-19 declaration under Section 564(b)(1) of the Act, 21 U.S.C. section 360bbb-3(b)(1), unless the authorization is terminated or revoked.  Performed at [facility], [phone]  [address], [place], [place] [address]   MRSA PCR Screening     Status: None   Collection Time: 10/30/20  1:25 AM   Specimen: Nasal Mucosa; Nasopharyngeal  Result Value Ref Range Status   MRSA by PCR NEGATIVE NEGATIVE Final    Comment:        The GeneXpert MRSA Assay (FDA approved for NASAL specimens only), is one component of a comprehensive MRSA colonization surveillance program. It is not intended to diagnose MRSA infection nor to guide or monitor treatment for MRSA infections. Performed at [facility], [address], [place], [place] [address]      Radiology Studies: No results found. Scheduled Meds: . Chlorhexidine Gluconate Cloth  6 each Topical Daily  . feeding supplement  237 mL Oral BID BM  . fluticasone furoate-vilanterol  1 puff Inhalation Daily  . hydrocortisone  25 mg Rectal BID  . pantoprazole  40 mg Oral Daily  . phosphorus  250 mg Oral TID   Continuous Infusions: . lactated ringers 50 mL/hr at 11/05/20 0554     LOS: 6 days   Roxan Hockey, MD How to contact the [facility] Attending or Consulting provider [place] or covering provider during after hours [place], for this patient?  1. Check the care team in [facility] and look for a) attending/consulting [facility] provider listed and b) the [facility] team listed 2. Log into [email or web address] and use [place]'s universal password to access. If you do not have the password, please contact the hospital operator. 3. Locate the [facility] provider  you are looking for under [facility] and page to a number that you can be directly reached. 4. If you still have difficulty reaching the provider, please page the [facility] (Director on Call) for the Hospitalists listed on amion for assistance.  11/05/2020, 1:21 PM

## 2020-11-05 NOTE — Progress Notes (Addendum)
Subjective:  Patient denies rectal bleeding.  He says he did have a bowel movement last night.  He just has to wait long time.  He denies abdominal pain nausea or vomiting.  He has been drinking Ensure supplement.  He states he is trying to drink 3 to 4 cans/day.  Current Medications:  Current Facility-Administered Medications:  .  acetaminophen (TYLENOL) tablet 650 mg, 650 mg, Oral, Q6H PRN **OR** acetaminophen (TYLENOL) suppository 650 mg, 650 mg, Rectal, Q6H PRN, Zierle-Ghosh, Asia B, DO .  Chlorhexidine Gluconate Cloth 2 % PADS 6 each, 6 each, Topical, Daily, Zierle-Ghosh, Asia B, DO, 6 each at 11/05/20 1006 .  feeding supplement (ENSURE ENLIVE / ENSURE PLUS) liquid 237 mL, 237 mL, Oral, BID BM, Zierle-Ghosh, Asia B, DO, 237 mL at 11/05/20 1302 .  fluticasone furoate-vilanterol (BREO ELLIPTA) 200-25 MCG/INH 1 puff, 1 puff, Inhalation, Daily, Zierle-Ghosh, Asia B, DO, 1 puff at 11/05/20 0759 .  hydrocortisone (ANUSOL-HC) suppository 25 mg, 25 mg, Rectal, BID, Emokpae, Courage, MD .  HYDROmorphone (DILAUDID) injection 0.5 mg, 0.5 mg, Intravenous, Q4H PRN, Johnson, Clanford L, MD, 0.5 mg at 10/31/20 1212 .  lactated ringers infusion, , Intravenous, Continuous, Claudia Desanctis, MD, Last Rate: 50 mL/hr at 11/05/20 0554, New Bag at 11/05/20 0554 .  ondansetron (ZOFRAN) tablet 4 mg, 4 mg, Oral, Q6H PRN **OR** ondansetron (ZOFRAN) injection 4 mg, 4 mg, Intravenous, Q6H PRN, Zierle-Ghosh, Asia B, DO, 4 mg at 10/30/20 2136 .  oxyCODONE (Oxy IR/ROXICODONE) immediate release tablet 5 mg, 5 mg, Oral, Q4H PRN, Zierle-Ghosh, Asia B, DO .  pantoprazole (PROTONIX) EC tablet 40 mg, 40 mg, Oral, Daily, Emokpae, Courage, MD, 40 mg at 11/05/20 1006 .  phosphorus (K PHOS NEUTRAL) tablet 250 mg, 250 mg, Oral, TID, Emokpae, Courage, MD, 250 mg at 11/05/20 1007    Objective: Blood pressure 130/68, pulse 80, temperature 98.5 F (36.9 C), temperature source Oral, resp. rate 18, height $RemoveBe'5\' 9"'WKpFLZOcy$  (1.753 m), weight 69.9  kg, SpO2 97 %. Patient is alert. He has generalized wasting. He is sitting in reclining chair and sipping on Ensure. His abdomen is protuberant bowel sounds are normal.  On palpation is soft and nontender with organomegaly or masses.   Labs/studies Results:  CBC Latest Ref Rng & Units 11/05/2020 11/04/2020 11/03/2020  WBC 4.0 - 10.5 K/uL 6.1 6.4 -  Hemoglobin 13.0 - 17.0 g/dL 8.9(L) 9.1(L) 9.5(L)  Hematocrit 39.0 - 52.0 % 26.7(L) 26.8(L) 27.9(L)  Platelets 150 - 400 K/uL 81(L) 82(L) -    CMP Latest Ref Rng & Units 11/05/2020 11/04/2020 11/04/2020  Glucose 70 - 99 mg/dL 80 - 88  BUN 8 - 23 mg/dL 50(H) - 67(H)  Creatinine 0.61 - 1.24 mg/dL 1.81(H) - 2.15(H)  Sodium 135 - 145 mmol/L 132(L) - 132(L)  Potassium 3.5 - 5.1 mmol/L 2.9(L) 3.0(L) 2.5(LL)  Chloride 98 - 111 mmol/L 105 - 104  CO2 22 - 32 mmol/L 22 - 23  Calcium 8.9 - 10.3 mg/dL 6.3(LL) - 6.8(L)  Total Protein 6.5 - 8.1 g/dL - - -  Total Bilirubin 0.3 - 1.2 mg/dL - - -  Alkaline Phos 38 - 126 U/L - - -  AST 15 - 41 U/L - - -  ALT 0 - 44 U/L - - -    Hepatic Function Latest Ref Rng & Units 11/05/2020 11/04/2020 11/03/2020  Total Protein 6.5 - 8.1 g/dL - - 8.9(H)  Albumin 3.5 - 5.0 g/dL 1.8(L) 2.0(L) 1.8(L)  AST 15 - 41 U/L - -  29  ALT 0 - 44 U/L - - 16  Alk Phosphatase 38 - 126 U/L - - 47  Total Bilirubin 0.3 - 1.2 mg/dL - - 1.1     Assessment:  #1.  Constipation.  Constipation appears to be multifactorial.  Limited food intake and physical activity and hypercalcemia which has now resolved.  He is on Colace and polyethylene glycol.  #2.  Hematochezia most likely secondary to hemorrhoids in the setting of constipation.  He has been treated symptomatically with Anusol HC cream.  Please note that he has declined to undergo colonoscopy in the past.  #3.  Anemia appears to be due to multiple myeloma.  He has received 2 units of PRBCs during this admission and all in all he has received 6 units this year.  H&H is stable.  #4.   Malignant hypercalcemia resulting in mental status changes and work-up suggest multiple myeloma.  He also has lytic lesions.  Serum calcium has improved with therapy.  Thrombocytopenia would appear to be due to multiple myeloma.  Dr. Delton Coombes is seeing patient.  #5.  Patient was also diagnosed with non-STEMI during this admission and seen by Dr. Harl Bowie who felt he was not a candidate for cardiac cath at this time.   Recommendations  Will give him polyethylene glycol 17 g daily. Colace 200 mg p.o. nightly.

## 2020-11-05 NOTE — Progress Notes (Signed)
Date and time results received: 11/05/20 0615   Test: Ca+ Critical Value: 6.3  Name of Provider Notified: Emokpae  Orders Received? No. MD aware and will make adjustments when morning rounds are done.

## 2020-11-05 NOTE — Plan of Care (Signed)
  Problem: Education: Goal: Knowledge of General Education information will improve Description: Including pain rating scale, medication(s)/side effects and non-pharmacologic comfort measures Outcome: Progressing   Problem: Clinical Measurements: Goal: Will remain free from infection Outcome: Progressing Goal: Diagnostic test results will improve Outcome: Progressing Goal: Respiratory complications will improve Outcome: Progressing   Problem: Coping: Goal: Level of anxiety will decrease Outcome: Progressing   Problem: Elimination: Goal: Will not experience complications related to bowel motility Outcome: Progressing Goal: Will not experience complications related to urinary retention Outcome: Progressing   Problem: Pain Managment: Goal: General experience of comfort will improve Outcome: Progressing   Problem: Safety: Goal: Ability to remain free from injury will improve Outcome: Progressing   Problem: Skin Integrity: Goal: Risk for impaired skin integrity will decrease Outcome: Progressing

## 2020-11-06 LAB — RENAL FUNCTION PANEL
Albumin: 1.8 g/dL — ABNORMAL LOW (ref 3.5–5.0)
Anion gap: 4 — ABNORMAL LOW (ref 5–15)
BUN: 42 mg/dL — ABNORMAL HIGH (ref 8–23)
CO2: 24 mmol/L (ref 22–32)
Calcium: 6.6 mg/dL — ABNORMAL LOW (ref 8.9–10.3)
Chloride: 106 mmol/L (ref 98–111)
Creatinine, Ser: 1.48 mg/dL — ABNORMAL HIGH (ref 0.61–1.24)
GFR, Estimated: 53 mL/min — ABNORMAL LOW (ref >60)
Glucose, Bld: 117 mg/dL — ABNORMAL HIGH (ref 70–99)
Phosphorus: 1.2 mg/dL — ABNORMAL LOW (ref 2.5–4.6)
Potassium: 2.8 mmol/L — ABNORMAL LOW (ref 3.5–5.1)
Sodium: 134 mmol/L — ABNORMAL LOW (ref 135–145)

## 2020-11-06 LAB — CBC
HCT: 25 % — ABNORMAL LOW (ref 39.0–52.0)
Hemoglobin: 8.3 g/dL — ABNORMAL LOW (ref 13.0–17.0)
MCH: 31.6 pg (ref 26.0–34.0)
MCHC: 33.2 g/dL (ref 30.0–36.0)
MCV: 95.1 fL (ref 80.0–100.0)
Platelets: 82 10*3/uL — ABNORMAL LOW (ref 150–400)
RBC: 2.63 MIL/uL — ABNORMAL LOW (ref 4.22–5.81)
RDW: 19.4 % — ABNORMAL HIGH (ref 11.5–15.5)
WBC: 5 10*3/uL (ref 4.0–10.5)
nRBC: 0 % (ref 0.0–0.2)

## 2020-11-06 LAB — MAGNESIUM: Magnesium: 1.3 mg/dL — ABNORMAL LOW (ref 1.7–2.4)

## 2020-11-06 MED ORDER — MAGNESIUM SULFATE 4 GM/100ML IV SOLN
4.0000 g | Freq: Once | INTRAVENOUS | Status: AC
  Administered 2020-11-06: 4 g via INTRAVENOUS
  Filled 2020-11-06: qty 100

## 2020-11-06 MED ORDER — POTASSIUM CHLORIDE CRYS ER 20 MEQ PO TBCR
40.0000 meq | EXTENDED_RELEASE_TABLET | Freq: Once | ORAL | Status: AC
  Administered 2020-11-06: 40 meq via ORAL
  Filled 2020-11-06: qty 2

## 2020-11-06 MED ORDER — POTASSIUM CHLORIDE CRYS ER 20 MEQ PO TBCR
40.0000 meq | EXTENDED_RELEASE_TABLET | ORAL | Status: AC
  Administered 2020-11-06 (×2): 40 meq via ORAL
  Filled 2020-11-06 (×2): qty 2

## 2020-11-06 MED ORDER — MAGNESIUM SULFATE 2 GM/50ML IV SOLN
2.0000 g | Freq: Once | INTRAVENOUS | Status: AC
  Administered 2020-11-06: 2 g via INTRAVENOUS
  Filled 2020-11-06: qty 50

## 2020-11-06 NOTE — Progress Notes (Signed)
PROGRESS NOTE   Paul Norton  MRN:[id number] DOB: 01/13/1956 DOA: 10/29/2020 PCP: Tysinger, David S, PA-C   Chief Complaint  Patient presents with  . Altered Mental Status   Level of care: Telemetry  Brief Admission History:  65 y.o. male, with history of HTN, HLD, and history of noncompliance with PCP advised normal presents to the ED with a chief complaint of confusion.  He was admitted with severe hypercalcemia, multiple bony lytic lesions in the calvarium and spinal column, acute renal failure, thrombocytopenia, severe anemia with hemoglobin of 3.9, severe dehydration, abnormal EKG, and severe encephalopathy.  Assessment & Plan:   Principal Problem:   Hypercalcemia Active Problems:   Essential hypertension, benign   Noncompliance   Hyperlipidemia   DOE (dyspnea on exertion)   AKI (acute kidney injury) (HCC)   Symptomatic anemia   Acute metabolic encephalopathy   Hyponatremia   Moderate protein-calorie malnutrition (HCC)   Dehydration   Abnormal CT of the head   Thrombocytopenia (HCC)   Refuses treatment   Metastatic malignant neoplasm (HCC)   Multiple myeloma not having achieved remission (HCC)   Hypercalcemia of Malignancy -has lytic lesions due to multiple myeloma -Patient presented with severe calcium elevation -Improved with IV fluids, IV pamidronate given on 4/16, calcitonin -Mentation appears much improved, close to baseline this time -calcium levels improved -even when corrected for low albumin patient is no longer hypercalcemic  Acute metabolic encephalopathy - secondary to severe hypercalcemia - Mentation improved as calcium levels improved- --Mentation appears much improved, close to baseline this time  continues to improve  Severe Anemia/thrombocytopenia--due to multiple myeloma - total of 6 units of PRBCs transfused this admission  - Follow CBC - No active bleeding found, stool was guaiac negative - holding all heparin for now -Received  chemotherapy Cytoxan dose reduced to 200 mg/m2 on 11/02/2020  Acute renal failure / myeloma kidney - Pt presented with severe dehydration and active untreated multiple myeloma -Did well with IV hydration Creatinine trending down--- 4.50 >>4.05>>3.66>>3.0>>2.48>2.15>1.81>1.48 -  Renally dose medications, appreciate nephrology consultation  --Received chemotherapy Cytoxan dose reduced to 200 mg/m2 on 11/02/2020  NSTEMI  - from demand ischemia related to very severe anemia - HS troponin peaked 3336 - Pt denies chest pain - EKG with ST depression, no ST elevation seen, reviewed with cardiologist at [facility] -Please see Dr. Branch consult note.   Not a candidate for heparin/aspirin at this time especially given anemia requiring transfusion and thrombocytopenia   Hypomagnesemia.Hypokalemia/hypophosphatemia --- recurrent episodes of hypokalemia and hypomagnesemia ---??  Some component of refeeding syndrome,  -continue to replace and recheck  Hyponatremia  - from poor oral intake and severe dehydration and malignancy associated SIADH Improved with IV fluids-- follow BMP.  Vitamin B12 deficiency -  B12 injections   multiple myeloma -bone marrow biopsy confirmed - SPEP highly suggestive with M spike Discussed with Dr. Katragadda --Received chemotherapy Cytoxan dose reduced to 200 mg/m2 on 11/02/2020  H/o Hyperlipidemia---Total chol is 57 , so Stopped Crestor  Essential hypertension - holding meds due to soft BPs   BRBPR--- -Had bloody BM most likely from hemorrhoids -GI input requested, recommends [facility]  Generalized weakness/ambulatory dysfunction--- PT eval, recommends SNF rehab,   Social/Ethics--discussed with patient and patient's brother Mark, - patient is a full code, -Patient has no payer source to go to SNF rehab -Brother is concerned that he is too weak to go home with home health even if we get a charity home health for him -Patient appears to   need 24/7 supervision at this  time -Patient and brother looking into possibly paying out-of-pocket for SNF rehab if he cannot get his [employer] from previous employer reinstated   DVT prophylaxis: SCD Code Status: Full  Family Communication: Brother Doctor, general practice from [place] Disposition: TBD Status is: Inpatient  Remains inpatient appropriate because:Persistent severe electrolyte disturbances, IV treatments appropriate due to intensity of illness or inability to take PO and Inpatient level of care appropriate due to severity of illness  Dispo: The patient is from: Home              Anticipated d/c is to: TBD              Patient currently is not medically stable to d/c.   Difficult to place patient No   Consultants:   Hem/onc   cardiology  Procedures:   -Received chemotherapy Cytoxan dose reduced to 200 mg/m2 on 11/02/2020 Antimicrobials:    Subjective: -Eating better -Voiding okay -Mentation appears much improved, close to baseline this time  Objective: Vitals:   11/05/20 0759 11/05/20 2043 11/06/20 0700 11/06/20 0735  BP:  124/67    Pulse:  72    Resp:  20    Temp:  98 F (36.7 C)    TempSrc:  Oral    SpO2: 97% 96%  96%  Weight:   69.8 kg   Height:        Intake/Output Summary (Last 24 hours) at 11/06/2020 1216 Last data filed at 11/06/2020 0900 Gross per 24 hour  Intake 1507.32 ml  Output 1450 ml  Net 57.32 ml   Filed Weights   11/04/20 0520 11/05/20 0500 11/06/20 0700  Weight: 71.2 kg 69.9 kg 69.8 kg   Examination:  Physical Exam Gen:- Awake Alert,  In no apparent distress  HEENT:- [place].AT, No sclera icterus Nose- [place]  Neck-Supple Neck,No JVD,.  Lungs-air movement is fair, no wheezing CV- S1, S2 normal, regular Abd-  +ve B.Sounds, Abd Soft, No tenderness,    Extremity/Skin:- No  edema, good pedal pulses Psych--Mentation appears much improved, close to baseline this time, affect is flat Neuro-generalized weakness, no new focal deficits, no tremors   Data Reviewed: I have  personally reviewed following labs and imaging studies  CBC: Recent Labs  Lab 11/02/20 0406 11/03/20 0625 11/03/20 1922 11/04/20 0517 11/05/20 0451 11/06/20 0532  WBC 4.5 6.2  --  6.4 6.1 5.0  HGB 7.0* 6.4* 9.5* 9.1* 8.9* 8.3*  HCT 21.3* 19.3* 27.9* 26.8* 26.7* 25.0*  MCV 98.2 98.0  --  93.7 94.3 95.1  PLT 71* 69*  --  82* 81* 82*   Basic Metabolic Panel: Recent Labs  Lab 11/01/20 0410 11/02/20 0406 11/03/20 0625 11/04/20 0517 11/04/20 1306 11/05/20 0451 11/06/20 0532  NA 131* 130* 130* 132*  --  132* 134*  K 3.3* 3.5 3.0* 2.5* 3.0* 2.9* 2.8*  CL 102 103 104 104  --  105 106  CO2 24 22 21* 23  --  22 24  GLUCOSE 73 107* 139* 88  --  80 117*  BUN 49* 54* 67* 67*  --  50* 42*  CREATININE 3.66* 3.04* 2.48* 2.15*  --  1.81* 1.48*  CALCIUM 9.7 8.3* 7.2* 6.8*  --  6.3* 6.6*  MG 1.9 1.6* 1.5* 2.0  --   --  1.3*  PHOS  --   --   --  2.1*  --  1.9* 1.2*    GFR: Estimated Creatinine Clearance: 50.4 mL/min (A) (by C-G formula based  on SCr of 1.48 mg/dL (H)).  Liver Function Tests: Recent Labs  Lab 10/31/20 0432 11/01/20 0410 11/02/20 0406 11/03/20 0625 11/04/20 0517 11/05/20 0451 11/06/20 0532  AST 41 36 32 29  --   --   --   ALT _0 --   --   --   ALKPHOS 51 48 45 47  --   --   --   BILITOT 0.7 0.9 1.1 1.1  --   --   --   PROT 10.3* 9.5* 8.8* 8.9*  --   --   --   ALBUMIN 2.0* 1.9* 1.7* 1.8* 2.0* 1.8* 1.8*    CBG: No results for input(s): GLUCAP in the last 168 hours.  Recent Results (from the past 240 hour(s))  Resp Panel by RT-PCR (Flu A&B, Covid) Nasopharyngeal Swab     Status: None   Collection Time: 10/29/20 10:28 PM   Specimen: Nasopharyngeal Swab; Nasopharyngeal(NP) swabs in vial transport medium  Result Value Ref Range Status   SARS Coronavirus 2 by RT PCR NEGATIVE NEGATIVE Final    Comment: (NOTE) SARS-CoV-2 target nucleic acids are NOT DETECTED.  The SARS-CoV-2 RNA is generally detectable in upper respiratory specimens during the acute  phase of infection. The lowest concentration of SARS-CoV-2 viral copies this assay can detect is 138 copies/mL. A negative result does not preclude SARS-Cov-2 infection and should not be used as the sole basis for treatment or other patient management decisions. A negative result may occur with  improper specimen collection/handling, submission of specimen other than nasopharyngeal swab, presence of viral mutation(s) within the areas targeted by this assay, and inadequate number of viral copies(<138 copies/mL). A negative result must be combined with clinical observations, patient history, and epidemiological information. The expected result is Negative.  Fact Sheet for Patients:  EntrepreneurPulse.com.au  Fact Sheet for Healthcare Providers:  IncredibleEmployment.be  This test is no t yet approved or cleared by the [place] FDA and  has been authorized for detection and/or diagnosis of SARS-CoV-2 by FDA under an Emergency Use Authorization (EUA). This EUA will remain  in effect (meaning this test can be used) for the duration of the COVID-19 declaration under Section 564(b)(1) of the Act, 21 U.S.C.section 360bbb-3(b)(1), unless the authorization is terminated  or revoked sooner.       Influenza A by PCR NEGATIVE NEGATIVE Final   Influenza B by PCR NEGATIVE NEGATIVE Final    Comment: (NOTE) The Xpert Xpress SARS-CoV-2/FLU/RSV plus assay is intended as an aid in the diagnosis of influenza from Nasopharyngeal swab specimens and should not be used as a sole basis for treatment. Nasal washings and aspirates are unacceptable for Xpert Xpress SARS-CoV-2/FLU/RSV testing.  Fact Sheet for Patients: EntrepreneurPulse.com.au  Fact Sheet for Healthcare Providers: IncredibleEmployment.be  This test is not yet approved or cleared by the [place] FDA and has been authorized for detection and/or diagnosis of  SARS-CoV-2 by FDA under an Emergency Use Authorization (EUA). This EUA will remain in effect (meaning this test can be used) for the duration of the COVID-19 declaration under Section 564(b)(1) of the Act, 21 U.S.C. section 360bbb-3(b)(1), unless the authorization is terminated or revoked.  Performed at [facility], [address], [place], [place] [address]   MRSA PCR Screening     Status: None   Collection Time: 10/30/20  1:25 AM   Specimen: Nasal Mucosa; Nasopharyngeal  Result Value Ref Range Status   MRSA by PCR NEGATIVE NEGATIVE Final    Comment:  The GeneXpert MRSA Assay (FDA approved for NASAL specimens only), is one component of a comprehensive MRSA colonization surveillance program. It is not intended to diagnose MRSA infection nor to guide or monitor treatment for MRSA infections. Performed at [facility], [address] [place], [place] [address]      Radiology Studies: No results found. Scheduled Meds: . docusate sodium  200 mg Oral QHS  . feeding supplement  237 mL Oral BID BM  . fluticasone furoate-vilanterol  1 puff Inhalation Daily  . hydrocortisone  25 mg Rectal BID  . pantoprazole  40 mg Oral Daily  . phosphorus  250 mg Oral TID  . polyethylene glycol  17 g Oral QHS  . potassium chloride  40 mEq Oral Q3H   Continuous Infusions: . lactated ringers 50 mL/hr at 11/05/20 0554  . magnesium sulfate bolus IVPB       LOS: 7 days    , MD How to contact the [facility] Attending or Consulting provider 7A - 7P or covering provider during after hours 7P -7A, for this patient?  1. Check the care team in [facility] and look for a) attending/consulting TRH provider listed and b) the [facility] team listed 2. Log into [email or web address] and use [facility]'s universal password to access. If you do not have the password, please contact the hospital operator. 3. Locate the [facility] provider you are looking for under [facility] and page to a number that you can be  directly reached. 4. If you still have difficulty reaching the provider, please page the [facility] (Director on Call) for the Hospitalists listed on amion for assistance.  11/06/2020, 12:16 PM  

## 2020-11-07 LAB — RENAL FUNCTION PANEL
Albumin: 2 g/dL — ABNORMAL LOW (ref 3.5–5.0)
Anion gap: 6 (ref 5–15)
BUN: 29 mg/dL — ABNORMAL HIGH (ref 8–23)
CO2: 24 mmol/L (ref 22–32)
Calcium: 6.5 mg/dL — ABNORMAL LOW (ref 8.9–10.3)
Chloride: 103 mmol/L (ref 98–111)
Creatinine, Ser: 1.28 mg/dL — ABNORMAL HIGH (ref 0.61–1.24)
GFR, Estimated: >60 mL/min (ref >60)
Glucose, Bld: 110 mg/dL — ABNORMAL HIGH (ref 70–99)
Phosphorus: 1.3 mg/dL — ABNORMAL LOW (ref 2.5–4.6)
Potassium: 2.9 mmol/L — ABNORMAL LOW (ref 3.5–5.1)
Sodium: 133 mmol/L — ABNORMAL LOW (ref 135–145)

## 2020-11-07 MED ORDER — PROSOURCE PLUS PO LIQD
30.0000 mL | Freq: Three times a day (TID) | ORAL | Status: DC
  Administered 2020-11-07: 30 mL via ORAL
  Filled 2020-11-07 (×3): qty 30

## 2020-11-07 MED ORDER — POTASSIUM CHLORIDE 10 MEQ/100ML IV SOLN
10.0000 meq | INTRAVENOUS | Status: AC
  Administered 2020-11-07 (×4): 10 meq via INTRAVENOUS
  Filled 2020-11-07: qty 100

## 2020-11-07 MED ORDER — ADULT MULTIVITAMIN W/MINERALS CH
1.0000 | ORAL_TABLET | Freq: Every day | ORAL | Status: DC
  Administered 2020-11-07 – 2020-11-09 (×3): 1 via ORAL
  Filled 2020-11-07 (×3): qty 1

## 2020-11-07 MED ORDER — POTASSIUM CHLORIDE 20 MEQ PO PACK
40.0000 meq | PACK | ORAL | Status: AC
  Administered 2020-11-07 (×2): 40 meq via ORAL
  Filled 2020-11-07 (×2): qty 2

## 2020-11-07 MED ORDER — ENSURE ENLIVE PO LIQD
237.0000 mL | Freq: Three times a day (TID) | ORAL | Status: DC
  Administered 2020-11-07 – 2020-11-09 (×5): 237 mL via ORAL

## 2020-11-07 MED ORDER — POTASSIUM CHLORIDE CRYS ER 20 MEQ PO TBCR: 40.0000 meq | EXTENDED_RELEASE_TABLET | ORAL | Status: DC

## 2020-11-07 MED ORDER — POTASSIUM CHLORIDE 20 MEQ PO PACK
40.0000 meq | PACK | Freq: Once | ORAL | Status: AC
  Administered 2020-11-07: 40 meq via ORAL
  Filled 2020-11-07: qty 2

## 2020-11-07 MED ORDER — POTASSIUM PHOSPHATES 15 MMOLE/5ML IV SOLN
20.0000 mmol | Freq: Once | INTRAVENOUS | Status: AC
  Administered 2020-11-07: 20 mmol via INTRAVENOUS
  Filled 2020-11-07: qty 6.67

## 2020-11-07 NOTE — Progress Notes (Signed)
Alert and oriented x 4. Denies pain or discomfort. Continues on IV fluids and tolerating well. Ambulates in room independently.

## 2020-11-07 NOTE — Progress Notes (Signed)
PROGRESS NOTE   Paul Norton  IOE:[id number] DOB: Apr 12, 1956 DOA: 10/29/2020 PCP: Carlena Hurl, PA-C   Chief Complaint  Patient presents with  . Altered Mental Status   Level of care: Telemetry  Brief Admission History:  65 y.o. male, with history of HTN, HLD, and history of noncompliance with PCP advised normal presents to the ED with a chief complaint of confusion.  He was admitted with severe hypercalcemia, multiple bony lytic lesions in the calvarium and spinal column, acute renal failure, thrombocytopenia, severe anemia with hemoglobin of 3.9, severe dehydration, abnormal EKG, and severe encephalopathy.  Assessment & Plan:   Principal Problem:   Hypercalcemia Active Problems:   Essential hypertension, benign   Noncompliance   Hyperlipidemia   DOE (dyspnea on exertion)   AKI (acute kidney injury) (HCC)   Symptomatic anemia   Acute metabolic encephalopathy   Hyponatremia   Moderate protein-calorie malnutrition (HCC)   Dehydration   Abnormal CT of the head   Thrombocytopenia (HCC)   Refuses treatment   Metastatic malignant neoplasm (HCC)   Multiple myeloma not having achieved remission (HCC)   Hypercalcemia of Malignancy -has lytic lesions due to multiple myeloma -Patient presented with severe calcium elevation -Improved with IV fluids, IV pamidronate given on 4/16, calcitonin -Mentation appears much improved, not quite back to baseline at this time -calcium levels improved -even when corrected for low albumin patient is no longer hypercalcemic  Acute metabolic encephalopathy - secondary to severe hypercalcemia - Mentation improved as calcium levels improved- --Mentation appears much improved, not quite back to baseline at this time  continues to improve  Severe Anemia/thrombocytopenia--due to multiple myeloma - total of 6 units of PRBCs transfused this admission  - Follow CBC - No active bleeding found, stool was guaiac negative - holding all  heparin for now -Received chemotherapy Cytoxan dose reduced to 200 mg/m2 on 11/02/2020  Acute renal failure / myeloma kidney - Pt presented with severe dehydration and active untreated multiple myeloma -Did well with IV hydration Creatinine trending down--- 4.50 >>4.05>>3.66>>3.0>>2.48>2.15>1.81>1.48>>1.28 -  Renally dose medications, appreciate nephrology consultation  --Received chemotherapy Cytoxan dose reduced to 200 mg/m2 on 11/02/2020  NSTEMI  - from demand ischemia related to very severe anemia - HS troponin peaked 3336 - Pt denies chest pain - EKG with ST depression, no ST elevation seen, reviewed with [facility]ologist at [facility] -Please see Dr. Harl Bowie consult note.   Not a candidate for heparin/aspirin at this time especially given anemia requiring transfusion and thrombocytopenia   Hypomagnesemia.Hypokalemia/hypophosphatemia --- recurrent episodes of hypokalemia and hypomagnesemia ---??  Some component of refeeding syndrome,  -continue to replace and recheck  Hyponatremia  - from poor oral intake and severe dehydration and malignancy associated SIADH Improved with IV fluids-- follow BMP.  Vitamin B12 deficiency -  B12 injections   multiple myeloma -bone marrow biopsy confirmed - SPEP highly suggestive with M spike Discussed with Dr. Delton Coombes --Received chemotherapy Cytoxan dose reduced to 200 mg/m2 on 11/02/2020  H/o Hyperlipidemia---Total chol is 57 , so Stopped Crestor  Essential hypertension - holding meds due to soft BPs   BRBPR--- -Had bloody BM most likely from hemorrhoids -GI input requested, recommends [facility]  Generalized weakness/ambulatory dysfunction--- PT eval, recommends SNF rehab,   Social/Ethics--discussed with patient and patient's brother Paul Norton, - patient is a full code, -Patient has no payer source to go to SNF rehab -Brother is concerned that he is too weak to go home with home health even if we get a charity home  health for him -Patient appears to  need 24/7 supervision at this time -Patient and brother looking into possibly paying out-of-pocket for SNF rehab if he cannot get his [employer] from previous employer reinstated   DVT prophylaxis: SCD Code Status: Full  Family Communication: Brother Doctor, general practice from [place] Disposition: TBD Status is: Inpatient  Remains inpatient appropriate because:Persistent severe electrolyte disturbances, IV treatments appropriate due to intensity of illness or inability to take PO and Inpatient level of care appropriate due to severity of illness  Dispo: The patient is from: Home              Anticipated d/c is to: TBD              Patient currently is not medically stable to d/c.   Difficult to place patient No   Consultants:   Hem/onc   cardiology  Procedures:   -Received chemotherapy Cytoxan dose reduced to 200 mg/m2 on 11/02/2020 Antimicrobials:    Subjective: not quite back to baseline at this time -Still intermittently confused  Objective: Vitals:   11/06/20 2035 11/07/20 0514 11/07/20 0813 11/07/20 1300  BP: (!) 118/57 122/62  120/60  Pulse: 64 (!) 59  62  Resp: _0 Temp: 98.4 F (36.9 C) 98 F (36.7 C)  97.9 F (36.6 C)  TempSrc: Oral Oral  Oral  SpO2: 96% 96% 97% 96%  Weight:  69 kg    Height:        Intake/Output Summary (Last 24 hours) at 11/07/2020 1953 Last data filed at 11/07/2020 1948 Gross per 24 hour  Intake 2381.9 ml  Output 3100 ml  Net -718.1 ml   Filed Weights   11/05/20 0500 11/06/20 0700 11/07/20 0514  Weight: 69.9 kg 69.8 kg 69 kg   Examination:  Physical Exam Gen:- Awake Alert,  In no apparent distress  HEENT:- [place].AT, No sclera icterus Nose- [place]  Neck-Supple Neck,No JVD,.  Lungs-air movement is fair, no wheezing CV- S1, S2 normal, regular Abd-  +ve B.Sounds, Abd Soft, No tenderness,    Extremity/Skin:- No  edema, good pedal pulses Psych--Mentation appears much improved, not quite back to baseline at this time, affect is flat,  intermittent confusion Neuro-generalized weakness, no new focal deficits, no tremors   Data Reviewed: I have personally reviewed following labs and imaging studies  CBC: Recent Labs  Lab 11/02/20 0406 11/03/20 0625 11/03/20 1922 11/04/20 0517 11/05/20 0451 11/06/20 0532  WBC 4.5 6.2  --  6.4 6.1 5.0  HGB 7.0* 6.4* 9.5* 9.1* 8.9* 8.3*  HCT 21.3* 19.3* 27.9* 26.8* 26.7* 25.0*  MCV 98.2 98.0  --  93.7 94.3 95.1  PLT 71* 69*  --  82* 81* 82*   Basic Metabolic Panel: Recent Labs  Lab 11/01/20 0410 11/02/20 0406 11/03/20 0625 11/04/20 0517 11/04/20 1306 11/05/20 0451 11/06/20 0532 11/07/20 0429  NA 131* 130* 130* 132*  --  132* 134* 133*  K 3.3* 3.5 3.0* 2.5* 3.0* 2.9* 2.8* 2.9*  CL 102 103 104 104  --  105 106 103  CO2 24 22 21* 23  --  _1 GLUCOSE 73 107* 139* 88  --  80 117* 110*  BUN 49* 54* 67* 67*  --  50* 42* 29*  CREATININE 3.66* 3.04* 2.48* 2.15*  --  1.81* 1.48* 1.28*  CALCIUM 9.7 8.3* 7.2* 6.8*  --  6.3* 6.6* 6.5*  MG 1.9 1.6* 1.5* 2.0  --   --  1.3*  --  PHOS  --   --   --  2.1*  --  1.9* 1.2* 1.3*    GFR: Estimated Creatinine Clearance: 57.6 mL/min (A) (by C-G formula based on SCr of 1.28 mg/dL (H)).  Liver Function Tests: Recent Labs  Lab 11/01/20 0410 11/02/20 0406 11/03/20 0625 11/04/20 0517 11/05/20 0451 11/06/20 0532 11/07/20 0429  AST 36 32 29  --   --   --   --   ALT _0 --   --   --   --   ALKPHOS 48 45 47  --   --   --   --   BILITOT 0.9 1.1 1.1  --   --   --   --   PROT 9.5* 8.8* 8.9*  --   --   --   --   ALBUMIN 1.9* 1.7* 1.8* 2.0* 1.8* 1.8* 2.0*    CBG: No results for input(s): GLUCAP in the last 168 hours.  Recent Results (from the past 240 hour(s))  Resp Panel by RT-PCR (Flu A&B, Covid) Nasopharyngeal Swab     Status: None   Collection Time: 10/29/20 10:28 PM   Specimen: Nasopharyngeal Swab; Nasopharyngeal(NP) swabs in vial transport medium  Result Value Ref Range Status   SARS Coronavirus 2 by RT PCR NEGATIVE  NEGATIVE Final    Comment: (NOTE) SARS-CoV-2 target nucleic acids are NOT DETECTED.  The SARS-CoV-2 RNA is generally detectable in upper respiratory specimens during the acute phase of infection. The lowest concentration of SARS-CoV-2 viral copies this assay can detect is 138 copies/mL. A negative result does not preclude SARS-Cov-2 infection and should not be used as the sole basis for treatment or other patient management decisions. A negative result may occur with  improper specimen collection/handling, submission of specimen other than nasopharyngeal swab, presence of viral mutation(s) within the areas targeted by this assay, and inadequate number of viral copies(<138 copies/mL). A negative result must be combined with clinical observations, patient history, and epidemiological information. The expected result is Negative.  Fact Sheet for Patients:  EntrepreneurPulse.com.au  Fact Sheet for Healthcare Providers:  IncredibleEmployment.be  This test is no t yet approved or cleared by the [place] FDA and  has been authorized for detection and/or diagnosis of SARS-CoV-2 by FDA under an Emergency Use Authorization (EUA). This EUA will remain  in effect (meaning this test can be used) for the duration of the COVID-19 declaration under Section 564(b)(1) of the Act, 21 U.S.C.section 360bbb-3(b)(1), unless the authorization is terminated  or revoked sooner.       Influenza A by PCR NEGATIVE NEGATIVE Final   Influenza B by PCR NEGATIVE NEGATIVE Final    Comment: (NOTE) The Xpert Xpress SARS-CoV-2/FLU/RSV plus assay is intended as an aid in the diagnosis of influenza from Nasopharyngeal swab specimens and should not be used as a sole basis for treatment. Nasal washings and aspirates are unacceptable for Xpert Xpress SARS-CoV-2/FLU/RSV testing.  Fact Sheet for Patients: EntrepreneurPulse.com.au  Fact Sheet for Healthcare  Providers: IncredibleEmployment.be  This test is not yet approved or cleared by the [place] FDA and has been authorized for detection and/or diagnosis of SARS-CoV-2 by FDA under an Emergency Use Authorization (EUA). This EUA will remain in effect (meaning this test can be used) for the duration of the COVID-19 declaration under Section 564(b)(1) of the Act, 21 U.S.C. section 360bbb-3(b)(1), unless the authorization is terminated or revoked.  Performed at [facility], [address] [place], [place] [address]  MRSA PCR Screening     Status: None   Collection Time: 10/30/20  1:25 AM   Specimen: Nasal Mucosa; Nasopharyngeal  Result Value Ref Range Status   MRSA by PCR NEGATIVE NEGATIVE Final    Comment:        The GeneXpert MRSA Assay (FDA approved for NASAL specimens only), is one component of a comprehensive MRSA colonization surveillance program. It is not intended to diagnose MRSA infection nor to guide or monitor treatment for MRSA infections. Performed at [facility], [address], [place], [place] [address]      Radiology Studies: No results found. Scheduled Meds: . (feeding supplement) PROSource Plus  30 mL Oral TID BM  . docusate sodium  200 mg Oral QHS  . feeding supplement  237 mL Oral TID BM  . fluticasone furoate-vilanterol  1 puff Inhalation Daily  . hydrocortisone  25 mg Rectal BID  . multivitamin with minerals  1 tablet Oral Daily  . pantoprazole  40 mg Oral Daily  . phosphorus  250 mg Oral TID  . polyethylene glycol  17 g Oral QHS   Continuous Infusions: . lactated ringers 20 mL/hr at 11/07/20 1948     LOS: 8 days   Roxan Hockey, MD How to contact the [facility] Attending or Consulting provider [place] or covering provider during after hours [place], for this patient?  1. Check the care team in [facility] and look for a) attending/consulting TR[facility] provider listed and b) the [facility] team listed 2. Log into [email or web address] and use [facility]'s universal password to access. If you do not have the password, please contact the hospital operator. 3. Locate the [facility] provider you are looking for under [facility] and page to a number that you can be directly reached. 4. If you still have difficulty reaching the provider, please page the [facility] (Director on Call) for the Hospitalists listed on amion for assistance.  11/07/2020, 7:53 PM

## 2020-11-07 NOTE — Progress Notes (Addendum)
Nutrition Follow up  DOCUMENTATION CODES:      INTERVENTION:  Ensure Enlive po TID, each supplement provides 350 kcal and 20 grams of protein  ProSource Plus 30 ml TID provides - 300 kcal, 45 gr protein daily  Recommend supplement Zinc- 11 mg daily  Offer snacks from nursing nourishment room between meals  Multivitamin daily  NUTRITION DIAGNOSIS:   Inadequate oral intake related to inability to eat (confusion, agitation); progressing  - experiencing taste changes  GOAL:   Pt to meet >/= 90% of their estimated nutrition needs    -Not met    MONITOR:   Diet advancement,I & O's,Labs,Weight trends   ASSESSMENT:  Patient is a 65 yo male with hypertension, hyperlipidemia and non compliance who presents with altered mental status. Multiple myeloma, AKI, NSTEMI, Hypomagnesemia, Hypokalemia, Hypophosphatemia and severe anemia.  Patient awake and recalls intake this morning as 2 bottles of Ensure Enlive (700 kcal, 40 gr protein). He also has snacks bedside which he has been eating. His meal intake the past few days has ranged from 1-100%. Nutrition services obtaining meal orders.   Patient doesn't like the hospital food and his brother has been bringing out side food for him. [employer] restaurant yesterday buttered noodles and plain salad. One meal he ordered [employer] pizza but was unable to eat because of taste changes. Patient is having difficulty finding foods that taste good to him.   Intake/Output Summary (Last 24 hours) at 11/07/2020 1048 Last data filed at 11/07/2020 0900 Gross per 24 hour  Intake 2780.17 ml  Output 1750 ml  Net 1030.17 ml  Since admission- +109.1 ml    Medications reviewed and include: colace, protonix, K phos, Miralax, Klor-con  IV-Lactated Ringers @ 20 ml/hr  Drips Potassium Phosphate-20 mmol  Labs: BMP Latest Ref Rng & Units 11/07/2020 11/06/2020 11/05/2020  Glucose 70 - 99 mg/dL 110(H) 117(H) 80  BUN 8 - 23 mg/dL 29(H) 42(H) 50(H)  Creatinine  0.61 - 1.24 mg/dL 1.28(H) 1.48(H) 1.81(H)  BUN/Creat Ratio 10 - 24 - - -  Sodium 135 - 145 mmol/L 133(L) 134(L) 132(L)  Potassium 3.5 - 5.1 mmol/L 2.9(L) 2.8(L) 2.9(L)  Chloride 98 - 111 mmol/L 103 106 105  CO2 22 - 32 mmol/L _0 Calcium 8.9 - 10.3 mg/dL 6.5(L) 6.6(L) 6.3(LL)     Diet Order:   Diet Order            DIET DYS 3 Room service appropriate? Yes; Fluid consistency: Thin  Diet effective now                 EDUCATION NEEDS:   Not appropriate for education at this time  Skin:  Skin Assessment: Reviewed RN Assessment  Last BM:  Prior to admission  Height:   Ht Readings from Last 1 Encounters:  10/30/20 _1  (1.753 m)    Weight:   Wt Readings from Last 1 Encounters:  11/07/20 69 kg    Ideal Body Weight:   73 kg  BMI:  Body mass index is 22.46 kg/m.  Estimated Nutritional Needs:   Kcal:  5436-0677  Protein:  92-99 gr  Fluid:  >2100 ml daily   Colman Cater MS,RD,CSG,LDN Contact: [email or web address]

## 2020-11-07 NOTE — Progress Notes (Signed)
Palliative: Paul Norton, Paul Norton, is lying quietly in bed.  He greets me, making and mostly keeping eye contact.  Although he is alert and oriented x3, he still seems confused at times, has tangential thoughts and speech, and will give over decision making responsibility to his brother Paul Norton.    We talked about his acute and chronic health concerns and the treatment plan.  He tells me that at this point he is agreeable to continue chemotherapy as offered, and go to short-term rehab if his brother feels this is best.  Conference with attending and transition of care team related to patient condition, needs, goals of care, disposition.  Plan: Continue full scope/full code.  Short-term rehab, likely private pay.  Continue oncology treatments as recommended.  Outpatient palliative services to follow.  25 minutes Paul Axe, NP Palliative medicine team Team phone [phone]  Greater than 50% of this time was spent counseling and coordinating care related to the above assessment and plan.

## 2020-11-07 NOTE — TOC Progression Note (Signed)
Transition of Care [facility]) - Progression Note    Patient Details  Name: Paul Norton MRN: [id number] Date of Birth: 03/27/1956  Transition of Care [facility]) CM/SW Contact  Boneta Lucks, RN Phone Number: 11/07/2020, 4:23 PM  Clinical Narrative:   Faxing POA paper to [employer] from [place] - sister in law. Stefanie Libel is working with family. They have decided to pay [employer]. Patient does not have a lot of money he will pay down with medical expenses and they are filling for medicaid. Patient needs PT and weekly labs. TOC to follow for DC plan tomorrow.     Expected Discharge Plan: [place] Barriers to Discharge: Barriers Resolved  Expected Discharge Plan and Services Expected Discharge Plan: [place] arrangements for the past 2 months: Single Family Home                    Readmission Risk Interventions Readmission Risk Prevention Plan 11/03/2020 11/03/2020  Transportation Screening Complete -  [place] or Home Care Consult Complete Complete  Social Work Consult for [place] Planning/Counseling - Complete  Palliative Care Screening - Complete  Medication Review Press photographer) - Complete  Some recent data might be hidden

## 2020-11-08 ENCOUNTER — Telehealth: Payer: Self-pay | Admitting: Medical

## 2020-11-08 LAB — RENAL FUNCTION PANEL
Albumin: 1.8 g/dL — ABNORMAL LOW (ref 3.5–5.0)
Anion gap: 2 — ABNORMAL LOW (ref 5–15)
BUN: 21 mg/dL (ref 8–23)
CO2: 25 mmol/L (ref 22–32)
Calcium: 6.1 mg/dL — CL (ref 8.9–10.3)
Chloride: 106 mmol/L (ref 98–111)
Creatinine, Ser: 1.21 mg/dL (ref 0.61–1.24)
GFR, Estimated: >60 mL/min
Glucose, Bld: 89 mg/dL (ref 70–99)
Phosphorus: 1.6 mg/dL — ABNORMAL LOW (ref 2.5–4.6)
Potassium: 4.1 mmol/L (ref 3.5–5.1)
Sodium: 133 mmol/L — ABNORMAL LOW (ref 135–145)

## 2020-11-08 LAB — CBC
HCT: 24.6 % — ABNORMAL LOW (ref 39.0–52.0)
Hemoglobin: 8 g/dL — ABNORMAL LOW (ref 13.0–17.0)
MCH: 31.7 pg (ref 26.0–34.0)
MCHC: 32.5 g/dL (ref 30.0–36.0)
MCV: 97.6 fL (ref 80.0–100.0)
Platelets: 102 10*3/uL — ABNORMAL LOW (ref 150–400)
RBC: 2.52 MIL/uL — ABNORMAL LOW (ref 4.22–5.81)
RDW: 19.2 % — ABNORMAL HIGH (ref 11.5–15.5)
WBC: 3.9 10*3/uL — ABNORMAL LOW (ref 4.0–10.5)
nRBC: 0 % (ref 0.0–0.2)

## 2020-11-08 LAB — METHYLMALONIC ACID, SERUM: Methylmalonic Acid, Quantitative: 648 nmol/L — ABNORMAL HIGH (ref 0–378)

## 2020-11-08 MED ORDER — POTASSIUM PHOSPHATES 15 MMOLE/5ML IV SOLN
20.0000 mmol | Freq: Once | INTRAVENOUS | Status: AC
  Administered 2020-11-08: 20 mmol via INTRAVENOUS
  Filled 2020-11-08: qty 6.67

## 2020-11-08 NOTE — Progress Notes (Signed)
Physical Therapy Treatment Patient Details Name: Paul Norton MRN: [id number] DOB: 09-Jul-1956 Today's Date: 11/08/2020    History of Present Illness Paul Norton  is a 65 y.o. male presents with confusion. Pt found to have hypercalcemia and Multiple myeloma. PMH: HTN, HLD, and history of noncompliance    PT Comments    Pt slightly impulsive, rising to stand and sit while therapist attempting to cue through exercises. Pt with poor safety awareness, attempting to walk around the room without regard to IV pole and reaching out for furniture/wall to steady self. PT cued pt through exercises, pt requires support surface for standing heel raises and squats to prevent LOB. Pt requires HHA to assume tandem stance with moderate trunk sway in position for 30 seconds. Pt declines ambulation in hallway due to wanting to eat and feeling "too weak". Will continue to progress acute PT as able.   Follow Up Recommendations  SNF;Supervision for mobility/OOB;Supervision/Assistance - 24 hour     Equipment Recommendations  None recommended by PT    Recommendations for Other Services       Precautions / Restrictions Precautions Precautions: Fall Restrictions Weight Bearing Restrictions: No    Mobility  Bed Mobility Overal bed mobility: Modified Independent  General bed mobility comments: supine<>sit modified independent    Transfers Overall transfer level: Needs assistance Equipment used: None Transfers: Sit to/from Stand;Stand Pivot Transfers Sit to Stand: Supervision Stand pivot transfers: Supervision    General transfer comment: supv for safety, impulsively rising from seated surfaces and slightly unsteady without physical assistance  Ambulation/Gait Ambulation/Gait assistance: Min guard Gait Distance (Feet): 30 Feet Assistive device: None Gait Pattern/deviations: Step-through pattern;Decreased stride length;Drifts right/left Gait velocity: decreased   General Gait Details:  pt slightly unsteady while ambulating around room, reaches out for furniture and wall to steady self as needed, declines RW or SPC use, "too weak" to ambulate in hallway and waiting for meal tray   Stairs             Wheelchair Mobility    Modified Rankin (Stroke Patients Only)       Balance     Sitting balance-Leahy Scale: Good Sitting balance - Comments: seated EOB   Standing balance support: During functional activity;No upper extremity supported Standing balance-Leahy Scale: Fair Standing balance comment: reaching out for furniture/wall as needed to steady self         Cognition Arousal/Alertness: Awake/alert Behavior During Therapy: Impulsive Overall Cognitive Status: Within Functional Limits for tasks assessed       Exercises General Exercises - Lower Extremity Hip Flexion/Marching: Standing;AROM;Strengthening;Both;20 reps Heel Raises: Standing;AROM;Strengthening;Both;20 reps Mini-Sqauts: 20 reps Other Exercises Other Exercises: romberg stance, 30 seconds, mild trunk sway Other Exercises: tandem stance, 30 seconds each LE back, requires HHA to assume position, moderate trunk sway    General Comments        Pertinent Vitals/Pain Pain Assessment: No/denies pain    Home Living                      Prior Function            PT Goals (current goals can now be found in the care plan section) Acute Rehab PT Goals Patient Stated Goal: return home with family to assist PT Goal Formulation: With patient Time For Goal Achievement: 11/17/20 Potential to Achieve Goals: Good Progress towards PT goals: Progressing toward goals    Frequency    Min 3X/week      PT Plan Current plan  remains appropriate    Co-evaluation              AM-PAC PT "6 Clicks" Mobility   Outcome Measure  Help needed turning from your back to your side while in a flat bed without using bedrails?: None Help needed moving from lying on your back to sitting on  the side of a flat bed without using bedrails?: A Little Help needed moving to and from a bed to a chair (including a wheelchair)?: A Little Help needed standing up from a chair using your arms (e.g., wheelchair or bedside chair)?: A Little Help needed to walk in hospital room?: A Little Help needed climbing 3-5 steps with a railing? : A Little 6 Click Score: 19    End of Session   Activity Tolerance: Patient tolerated treatment well Patient left: in bed;with call bell/phone within reach Nurse Communication: Mobility status PT Visit Diagnosis: Unsteadiness on feet (R26.81);Other abnormalities of gait and mobility (R26.89);Muscle weakness (generalized) (M62.81)     Time: 1642-9037 PT Time Calculation (min) (ACUTE ONLY): 19 min  Charges:  $Therapeutic Exercise: 8-22 mins                      Tori Argil Mahl PT, DPT 11/08/20, 3:18 PM

## 2020-11-08 NOTE — Plan of Care (Signed)
  Problem: Education: Goal: Knowledge of General Education information will improve Description: Including pain rating scale, medication(s)/side effects and non-pharmacologic comfort measures Outcome: Adequate for Discharge   Problem: Health Behavior/Discharge Planning: Goal: Ability to manage health-related needs will improve Outcome: Adequate for Discharge   Problem: Safety: Goal: Ability to remain free from injury will improve Outcome: Adequate for Discharge   Problem: Skin Integrity: Goal: Risk for impaired skin integrity will decrease Outcome: Adequate for Discharge   

## 2020-11-08 NOTE — TOC Progression Note (Signed)
Transition of Care [facility]) - Progression Note    Patient Details  Name: Advait Buice MRN: [id number] Date of Birth: [birth year]  Transition of [facility]) CM/SW Contact  Boneta Lucks, RN Phone Number: 11/08/2020, 2:43 PM  Clinical Narrative:   It will be at least a week before [facility] take affect.  [name]- brother is planning to take patient home to [place] when stable.  He is willing to take patient to outpt PT and for weekly labs.  MD updated to order. If patient will go home on expensive medication he will need a match voucher. Patient is still on IV meds today.  [name] will go to cancer center today to schedule next appoint with Dr. Raliegh Ip. TOC to follow.   Expected Discharge Plan: [place] Barriers to Discharge: Barriers Resolved  Expected Discharge Plan and Services Expected Discharge Plan: [place] arrangements for the past 2 months: Single Family Home                  Readmission Risk Interventions Readmission Risk Prevention Plan 11/03/2020 11/03/2020  Transportation Screening Complete -  [place] or Home Care Consult Complete Complete  Social Work Consult for [place] Planning/Counseling - Complete  Palliative Care Screening - Complete  Medication Review Press photographer) - Complete  Some recent data might be hidden

## 2020-11-08 NOTE — Progress Notes (Signed)
Date and time results received: 11/08/20 @ 0835   Test: Ca and K Critical Value: 6.1 and 4.1 respectively  Name of Provider Notified: Courage   Orders Received? Or Actions Taken?: Provider acknowledged receipt of results.

## 2020-11-08 NOTE — Progress Notes (Signed)
Alert and oriented x 3. Denies pain or discomfort at this time. Reports improved appetite and want to eat and gain his 40 lbs back. IV fuids infused and tolerated well. Telemetry monitor ongoing. Ambulates in room and transfers independently. No concerns at this time.

## 2020-11-08 NOTE — Telephone Encounter (Signed)
Pt sister n law called and states that pt is getting out of the hospital tomorrow and needs a fu before next wed states he needs blood work states he was diagnosed with blood cancer, and has been in there since last week, they started pt with his first round of chemo, I know the appt desk states run all appt through you before making them  His brother is now POA of him states are you ok with pt coming, in I think she said he was in more head hospital  His brother can be reached at [phone] If either one calls back they are not on hippa informed they would need to be added if they come in for visit and to bring POA papers

## 2020-11-08 NOTE — Progress Notes (Signed)
PROGRESS NOTE   Paul Norton  TZG:[id number] DOB: February 23, 1956 DOA: 10/29/2020 PCP: Carlena Hurl, PA-C   Chief Complaint  Patient presents with  . Altered Mental Status   Level of care: Telemetry  Brief Admission History:  65 y.o. male, with history of HTN, HLD, and history of noncompliance with PCP advised normal presents to the ED with a chief complaint of confusion.  He was admitted with severe hypercalcemia, multiple bony lytic lesions in the calvarium and spinal column, acute renal failure, thrombocytopenia, severe anemia with hemoglobin of 3.9, severe dehydration, abnormal EKG, and severe encephalopathy.  Assessment & Plan:   Principal Problem:   Hypercalcemia Active Problems:   Essential hypertension, benign   Noncompliance   Hyperlipidemia   DOE (dyspnea on exertion)   AKI (acute kidney injury) (HCC)   Symptomatic anemia   Acute metabolic encephalopathy   Hyponatremia   Moderate protein-calorie malnutrition (HCC)   Dehydration   Abnormal CT of the head   Thrombocytopenia (HCC)   Refuses treatment   Metastatic malignant neoplasm (HCC)   Multiple myeloma not having achieved remission (HCC)   Hypercalcemia of Malignancy -has lytic lesions due to multiple myeloma -Patient presented with severe calcium elevation -Improved with IV fluids, IV pamidronate given on 4/16, calcitonin -Mentation appears much improved, close to baseline at this time -calcium levels improved -even when corrected for low albumin patient is no longer hypercalcemic  Acute metabolic encephalopathy - secondary to severe hypercalcemia - Mentation improved as calcium levels improved- --Mentation appears much improved, close to baseline at this time  continues to improve  Severe Anemia/thrombocytopenia--due to multiple myeloma - total of 6 units of PRBCs transfused this admission  - Follow CBC - No active bleeding found, stool was guaiac negative - holding all heparin for  now -Received chemotherapy Cytoxan dose reduced to 200 mg/m2 on 11/02/2020  Acute renal failure / myeloma kidney - Pt presented with severe dehydration and active untreated multiple myeloma -Did well with IV hydration Creatinine trending down--- 4.50 >>4.05>>3.66>>3.0>>2.48>2.15>1.81>1.48>>1.28>1.21 -  Renally dose medications, appreciate nephrology consultation  --Received chemotherapy Cytoxan dose reduced to 200 mg/m2 on 11/02/2020  NSTEMI  - from demand ischemia related to very severe anemia - HS troponin peaked 3336 - Pt denies chest pain - EKG with ST depression, no ST elevation seen, reviewed with cardiologist at [facility] -Please see Dr. Harl Bowie consult note.   Not a candidate for heparin/aspirin at this time especially given anemia requiring transfusion and thrombocytopenia   Hypomagnesemia.Hypokalemia/hypophosphatemia --- recurrent episodes of hypokalemia and hypomagnesemia ---??  Some component of refeeding syndrome,  -continue to replace and recheck  Hyponatremia  - from poor oral intake and severe dehydration and malignancy associated SIADH -Sodium is up to 133 Improved with IV fluids-- follow BMP.  Vitamin B12 deficiency -  B12 injections   multiple myeloma -bone marrow biopsy confirmed - SPEP highly suggestive with M spike Discussed with Dr. Delton Coombes --Received chemotherapy Cytoxan dose reduced to 200 mg/m2 on 11/02/2020  H/o Hyperlipidemia---Total chol is 57 , so Stopped Crestor  Essential hypertension - holding meds due to soft BPs   BRBPR--- -Had bloody BM most likely from hemorrhoids -GI input requested, recommends [facility]  Generalized weakness/ambulatory dysfunction--- PT eval, recommends SNF rehab,   Social/Ethics--discussed with patient and patient's brother [name], - patient is a full code, -Patient has no payer source to go to SNF rehab -Brother is concerned that he is too weak to go home with home health even if we get a charity  home health for  him -Patient appears to need 24/7 supervision at this time -Patient and brother looking into possibly paying out-of-pocket for SNF rehab if he cannot get his [employer] from previous employer reinstated --In all likelihood patient's brother and sister-in-law will take him home to their house in [place] on 11/09/2020 with outpatient follow-ups with oncology and PCP  DVT prophylaxis: SCD Code Status: Full  Family Communication: Brother [name] from [place] Disposition: TBD Status is: Inpatient  Remains inpatient appropriate because:Persistent severe electrolyte disturbances, IV treatments appropriate due to intensity of illness or inability to take PO and Inpatient level of care appropriate due to severity of illness  Dispo: The patient is from: Home              Anticipated d/c is to: home with brother on 11/09/20              Patient currently is not medically stable to d/c.   Difficult to place patient No   Consultants:   Hem/onc   cardiology  Procedures:   -Received chemotherapy Cytoxan dose reduced to 200 mg/m2 on 11/02/2020 Antimicrobials:    Subjective: Close to baseline at this time -Still intermittently confused ---In all likelihood patient's brother and sister-in-law will take him home to their house in [place] on 11/09/2020 with outpatient follow-ups with oncology and PCP   Objective: Vitals:   11/08/20 0500 11/08/20 0510 11/08/20 0811 11/08/20 1258  BP:  131/76  117/60  Pulse:  66  62  Resp:  20  17  Temp:  97.6 F (36.4 C)  98.2 F (36.8 C)  TempSrc:    Oral  SpO2:  98% 97% 100%  Weight: 69.1 kg     Height:        Intake/Output Summary (Last 24 hours) at 11/08/2020 1922 Last data filed at 11/08/2020 1740 Gross per 24 hour  Intake 1635.14 ml  Output 2500 ml  Net -864.86 ml   Filed Weights   11/06/20 0700 11/07/20 0514 11/08/20 0500  Weight: 69.8 kg 69 kg 69.1 kg   Examination:  Physical Exam Gen:- Awake Alert,  In no apparent distress  HEENT:-  [place].AT, No sclera icterus Nose- [place]  Neck-Supple Neck,No JVD,.  Lungs-air movement is fair, no wheezing CV- S1, S2 normal, regular Abd-  +ve B.Sounds, Abd Soft, No tenderness,    Extremity/Skin:- No  edema, good pedal pulses Psych--Mentation appears much improved, close to baseline at this time, affect is flat, intermittent confusion Neuro-generalized weakness, no new focal deficits, no tremors   Data Reviewed: I have personally reviewed following labs and imaging studies  CBC: Recent Labs  Lab 11/03/20 0625 11/03/20 1922 11/04/20 0517 11/05/20 0451 11/06/20 0532 11/08/20 0712  WBC 6.2  --  6.4 6.1 5.0 3.9*  HGB 6.4* 9.5* 9.1* 8.9* 8.3* 8.0*  HCT 19.3* 27.9* 26.8* 26.7* 25.0* 24.6*  MCV 98.0  --  93.7 94.3 95.1 97.6  PLT 69*  --  82* 81* 82* 948*   Basic Metabolic Panel: Recent Labs  Lab 11/02/20 0406 11/03/20 0625 11/04/20 0517 11/04/20 1306 11/05/20 0451 11/06/20 0532 11/07/20 0429 11/08/20 0712  NA 130* 130* 132*  --  132* 134* 133* 133*  K 3.5 3.0* 2.5* 3.0* 2.9* 2.8* 2.9* 4.1  CL 103 104 104  --  105 106 103 106  CO2 22 21* 23  --  $R'22 24 24 25  'Rv$ GLUCOSE 107* 139* 88  --  80 117* 110* 89  BUN 54* 67* 67*  --  50* 42* 29* 21  CREATININE 3.04* 2.48* 2.15*  --  1.81* 1.48* 1.28* 1.21  CALCIUM 8.3* 7.2* 6.8*  --  6.3* 6.6* 6.5* 6.1*  MG 1.6* 1.5* 2.0  --   --  1.3*  --   --   PHOS  --   --  2.1*  --  1.9* 1.2* 1.3* 1.6*    GFR: Estimated Creatinine Clearance: 61.1 mL/min (by C-G formula based on SCr of 1.21 mg/dL).  Liver Function Tests: Recent Labs  Lab 11/02/20 0406 11/03/20 3428 11/04/20 0517 11/05/20 0451 11/06/20 0532 11/07/20 0429 11/08/20 0712  AST 32 29  --   --   --   --   --   ALT 15 16  --   --   --   --   --   ALKPHOS 45 47  --   --   --   --   --   BILITOT 1.1 1.1  --   --   --   --   --   PROT 8.8* 8.9*  --   --   --   --   --   ALBUMIN 1.7* 1.8* 2.0* 1.8* 1.8* 2.0* 1.8*    CBG: No results for input(s): GLUCAP in the last 168  hours.  Recent Results (from the past 240 hour(s))  Resp Panel by RT-PCR (Flu A&B, Covid) Nasopharyngeal Swab     Status: None   Collection Time: 10/29/20 10:28 PM   Specimen: Nasopharyngeal Swab; Nasopharyngeal(NP) swabs in vial transport medium  Result Value Ref Range Status   SARS Coronavirus 2 by RT PCR NEGATIVE NEGATIVE Final    Comment: (NOTE) SARS-CoV-2 target nucleic acids are NOT DETECTED.  The SARS-CoV-2 RNA is generally detectable in upper respiratory specimens during the acute phase of infection. The lowest concentration of SARS-CoV-2 viral copies this assay can detect is 138 copies/mL. A negative result does not preclude SARS-Cov-2 infection and should not be used as the sole basis for treatment or other patient management decisions. A negative result may occur with  improper specimen collection/handling, submission of specimen other than nasopharyngeal swab, presence of viral mutation(s) within the areas targeted by this assay, and inadequate number of viral copies(<138 copies/mL). A negative result must be combined with clinical observations, patient history, and epidemiological information. The expected result is Negative.  Fact Sheet for Patients:  EntrepreneurPulse.com.au  Fact Sheet for Healthcare Providers:  IncredibleEmployment.be  This test is no t yet approved or cleared by the [place] FDA and  has been authorized for detection and/or diagnosis of SARS-CoV-2 by FDA under an Emergency Use Authorization (EUA). This EUA will remain  in effect (meaning this test can be used) for the duration of the COVID-19 declaration under Section 564(b)(1) of the Act, 21 U.S.C.section 360bbb-3(b)(1), unless the authorization is terminated  or revoked sooner.       Influenza A by PCR NEGATIVE NEGATIVE Final   Influenza B by PCR NEGATIVE NEGATIVE Final    Comment: (NOTE) The Xpert Xpress SARS-CoV-2/FLU/RSV plus assay is intended  as an aid in the diagnosis of influenza from Nasopharyngeal swab specimens and should not be used as a sole basis for treatment. Nasal washings and aspirates are unacceptable for Xpert Xpress SARS-CoV-2/FLU/RSV testing.  Fact Sheet for Patients: EntrepreneurPulse.com.au  Fact Sheet for Healthcare Providers: IncredibleEmployment.be  This test is not yet approved or cleared by the [place] FDA and has been authorized for detection and/or diagnosis of SARS-CoV-2 by FDA under an Emergency  Use Authorization (EUA). This EUA will remain in effect (meaning this test can be used) for the duration of the COVID-19 declaration under Section 564(b)(1) of the Act, 21 U.S.C. section 360bbb-3(b)(1), unless the authorization is terminated or revoked.  Performed at [facility], [address], [place], [place] [address]   MRSA PCR Screening     Status: None   Collection Time: 10/30/20  1:25 AM   Specimen: Nasal Mucosa; Nasopharyngeal  Result Value Ref Range Status   MRSA by PCR NEGATIVE NEGATIVE Final    Comment:        The GeneXpert MRSA Assay (FDA approved for NASAL specimens only), is one component of a comprehensive MRSA colonization surveillance program. It is not intended to diagnose MRSA infection nor to guide or monitor treatment for MRSA infections. Performed at [facility], [address] [place], [place] [address]      Radiology Studies: No results found. Scheduled Meds: . (feeding supplement) PROSource Plus  30 mL Oral TID BM  . docusate sodium  200 mg Oral QHS  . feeding supplement  237 mL Oral TID BM  . fluticasone furoate-vilanterol  1 puff Inhalation Daily  . hydrocortisone  25 mg Rectal BID  . multivitamin with minerals  1 tablet Oral Daily  . pantoprazole  40 mg Oral Daily  . polyethylene glycol  17 g Oral QHS   Continuous Infusions: . lactated ringers Stopped (11/07/20 2257)     LOS: 9 days   Roxan Hockey, MD How  to contact the [facility] Attending or Consulting provider [place] or covering provider during after hours [place], for this patient?  1. Check the care team in [facility] and look for a) attending/consulting TRH provider listed and b) the [facility] team listed 2. Log into [email or web address] and use [place]'s universal password to access. If you do not have the password, please contact the hospital operator. 3. Locate the [facility] provider you are looking for under [facility] and page to a number that you can be directly reached. 4. If you still have difficulty reaching the provider, please page the [facility] (Director on Call) for the Hospitalists listed on amion for assistance.  11/08/2020, 7:22 PM

## 2020-11-09 ENCOUNTER — Telehealth (HOSPITAL_COMMUNITY): Payer: Self-pay | Admitting: Pharmacy Technician

## 2020-11-09 ENCOUNTER — Other Ambulatory Visit (HOSPITAL_COMMUNITY): Payer: Self-pay

## 2020-11-09 LAB — CBC
HCT: 25.7 % — ABNORMAL LOW (ref 39.0–52.0)
Hemoglobin: 8.1 g/dL — ABNORMAL LOW (ref 13.0–17.0)
MCH: 30.9 pg (ref 26.0–34.0)
MCHC: 31.5 g/dL (ref 30.0–36.0)
MCV: 98.1 fL (ref 80.0–100.0)
Platelets: 118 10*3/uL — ABNORMAL LOW (ref 150–400)
RBC: 2.62 MIL/uL — ABNORMAL LOW (ref 4.22–5.81)
RDW: 18.8 % — ABNORMAL HIGH (ref 11.5–15.5)
WBC: 4.2 10*3/uL (ref 4.0–10.5)
nRBC: 0 % (ref 0.0–0.2)

## 2020-11-09 LAB — RENAL FUNCTION PANEL
Albumin: 1.9 g/dL — ABNORMAL LOW (ref 3.5–5.0)
Anion gap: 5 (ref 5–15)
BUN: 19 mg/dL (ref 8–23)
CO2: 23 mmol/L (ref 22–32)
Calcium: 6.1 mg/dL — CL (ref 8.9–10.3)
Chloride: 105 mmol/L (ref 98–111)
Creatinine, Ser: 1.07 mg/dL (ref 0.61–1.24)
GFR, Estimated: >60 mL/min (ref >60)
Glucose, Bld: 108 mg/dL — ABNORMAL HIGH (ref 70–99)
Phosphorus: 1.5 mg/dL — ABNORMAL LOW (ref 2.5–4.6)
Potassium: 3.9 mmol/L (ref 3.5–5.1)
Sodium: 133 mmol/L — ABNORMAL LOW (ref 135–145)

## 2020-11-09 MED ORDER — CALCIUM CARBONATE-VITAMIN D 500-200 MG-UNIT PO TABS
1.0000 | ORAL_TABLET | Freq: Two times a day (BID) | ORAL | Status: DC
  Administered 2020-11-09: 1 via ORAL
  Filled 2020-11-09: qty 1

## 2020-11-09 MED ORDER — OXYCODONE HCL 5 MG PO TABS: 5.0000 mg | ORAL_TABLET | ORAL | 0 refills | Status: AC | PRN

## 2020-11-09 MED ORDER — HYDROCORTISONE ACETATE 25 MG RE SUPP: 25.0000 mg | Freq: Two times a day (BID) | RECTAL | 0 refills | Status: DC

## 2020-11-09 MED ORDER — CALCIUM GLUCONATE-NACL 1-0.675 GM/50ML-% IV SOLN: 1.0000 g | Freq: Once | INTRAVENOUS | Status: DC

## 2020-11-09 MED ORDER — CALCIUM CARBONATE-VITAMIN D 500-200 MG-UNIT PO TABS: 1.0000 | ORAL_TABLET | Freq: Two times a day (BID) | ORAL | 0 refills | Status: DC

## 2020-11-09 MED ORDER — CALCIUM GLUCONATE-NACL 1-0.675 GM/50ML-% IV SOLN
1.0000 g | Freq: Once | INTRAVENOUS | Status: DC
  Filled 2020-11-09: qty 50

## 2020-11-09 MED ORDER — LENALIDOMIDE 15 MG PO CAPS: 15.0000 mg | ORAL_CAPSULE | Freq: Every day | ORAL | 0 refills | Status: DC

## 2020-11-09 MED ORDER — ACETAMINOPHEN 325 MG PO TABS: 650.0000 mg | ORAL_TABLET | Freq: Four times a day (QID) | ORAL | 30 refills | Status: AC | PRN

## 2020-11-09 MED ORDER — ENSURE ENLIVE PO LIQD: 237.0000 mL | Freq: Three times a day (TID) | ORAL | 12 refills | Status: AC

## 2020-11-09 NOTE — Telephone Encounter (Signed)
New order for Revlimid 15mg  14 days on, 7 days off sent to [facility] per Dr. Delton Coombes

## 2020-11-09 NOTE — Telephone Encounter (Signed)
Oral Oncology Patient Advocate Encounter  Coordinated with patients brother to complete application for [employer] to reduce patient's out of pocket expense for Revlimid to $0.    Application completed and faxed to [phone].   [facility] Access Support phone number for follow up is [phone].  This encounter will be updated until final determination.  [place] Patient [place] Phone [phone] Fax [phone] 11/11/2020 11:10 AM

## 2020-11-09 NOTE — Telephone Encounter (Signed)
Let them I know I have been reviewing the hospital notes.  Please make sure a relative comes with him to the appt.

## 2020-11-09 NOTE — TOC Transition Note (Signed)
Transition of Care [facility]) - CM/SW Discharge Note   Patient Details  Name: Paul Norton MRN: [id number] Date of Birth: Jul 12, 1956  Transition of Care [facility]) CM/SW Contact:  Paul Arnt, LCSW Phone Number: 11/09/2020, 12:00 PM   Clinical Narrative:   Pt d/c today. LCSW spoke with pt's brother, Paul Norton regarding d/c. He reports he will pick up pt today. Paul Norton has scheduled PCP appointment for 5/2 and cancer center appointment for 5/3. Reviewed new medications with Paul Norton, most of which are over the counter and under $10. He is aware [employer] voucher would not cover oxycodone. Paul Norton states they have all DME at home. Discussed outpatient PT with [facility]. He states he talked about assisting pt with home exercise program at home and not doing outpatient PT due to possible out of pocket cost as pt does not currently have insurance. [name] requests home exercise program- PT notified. [name] plans to contact PCP if they feel outpatient PT will be needed. Discussed oral chemo which has been prescribed. Paul Norton is aware that cancer center is looking into assistance programs for pt and this will be further discussed on Tuesday.       Final next level of care: Home/Self Care Barriers to Discharge: Barriers Resolved   Patient Goals and CMS Choice Patient states their goals for this hospitalization and ongoing recovery are:: To get better CMS Medicare.gov Compare Post Acute Care list provided to:: Patient Choice offered to / list presented to : Patient  Discharge Placement                  Name of family member notified: Paul Norton- brother Patient and family notified of of transfer: 11/09/20  Discharge Plan and Services                DME Arranged: N/A DME Agency: NA                  Social Determinants of Health ([place]) Interventions     Readmission Risk Interventions Readmission Risk Prevention Plan 11/03/2020 11/03/2020  Transportation Screening Complete -  [place] or [place]  Complete Complete  Social Work Consult for [place] Planning/Counseling - Complete  Palliative Care Screening - Complete  Medication Review Press photographer) - Complete  Some recent data might be hidden

## 2020-11-09 NOTE — Discharge Summary (Addendum)
Physician Discharge Summary [facility]    Patient: Paul Norton                   Admit date: 10/29/2020   DOB: 14-Oct-1955             Discharge date:11/09/2020/11:13 AM NOT:[id number]                          PCP: Carlena Hurl, PA-C  Disposition: HOME with Home Health   Recommendations for Outpatient Follow-up:   . Follow up: Primary care physician in 1 week (has appointment for upcoming Monday) . With with oncologist Dr. Delton Coombes in 1 weeks.. next Monday  . New order for Revlimid 54m 14 days on, 7 days off sent to [facility] Dr. KDelton Coombesalso F/up Labs and velcade and darzalex. 3 hours next week   .   Discharge Condition: Stable   Code Status:   Code Status: Full Code  Diet recommendation: Regular healthy diet   Discharge Diagnoses:    Principal Problem:   Hypercalcemia Active Problems:   Essential hypertension, benign   Noncompliance   Hyperlipidemia   DOE (dyspnea on exertion)   AKI (acute kidney injury) ([facility]   Symptomatic anemia   Acute metabolic encephalopathy   Hyponatremia   Moderate protein-calorie malnutrition (HCC)   Dehydration   Abnormal CT of the head   Thrombocytopenia ([place]   Refuses treatment   Metastatic malignant neoplasm ([place]   Multiple myeloma not having achieved remission ([place]   History of Present Illness/ Hospital Course /Kathleen ArgueSummary:    65y.o.male,with history of HTN, HLD,and history of noncompliance with PCP advised normal presents to the ED with a chief complaint of confusion.  He was admitted with severe hypercalcemia, multiple bony lytic lesions in the calvarium and spinal column, acute renal failure, thrombocytopenia, severe anemia with hemoglobin of 3.9, severe dehydration, abnormal EKG, and severe encephalopathy.    [place] myeloma not having achieved remission ([place] Hypercalcemia of Malignancy -has lytic lesions due to multiple myeloma -Patient presented with severe  calcium elevation -Improved with IV fluids, IV pamidronate given on 4/16, calcitonin -Mentation appears much improved, close to baseline at this time -calcium levels improved -even when corrected for low albumin patient is no longer hypercalcemic - New order for Revlimid 120m14 days on, 7 days off sent to [facility] Dr. KaDelton Coombeslso F/up Labs and velcade and darzalex. 3 hours next week   Acute metabolic encephalopathy - secondary to severe hypercalcemia - Mentation improved as calcium levels improved- --Mentation appears much improved, close to baseline at this time  continues to improve  Severe Anemia/thrombocytopenia--due to multiple myeloma - total of 6 units of PRBCs transfused this admission  - Follow CBC - No active bleeding found, stool was guaiac negative - holding all heparin for now -Received chemotherapy Cytoxan dose reduced to 200 mg/m2 on 11/02/2020  Acute renal failure / myeloma kidney - Pt presented with severe dehydration and active untreated multiple myeloma -Did well with IV hydration Creatinine trending down--- 4.50 >>4.05>>3.66>>3.0>>2.48>2.15>1.81>1.48>>1.28>1.21 -  Renally dose medications, appreciate nephrology consultation  --Received chemotherapy Cytoxan dose reduced to 200 mg/m2 on 11/02/2020  NSTEMI  - from demand ischemia related to very severe anemia - HS troponin peaked 3336 - Pt denies chest pain - EKG with ST depression, no ST elevation seen, [facility]. BrHarl Bowieonsult note.   Not a candidate  for heparin/aspirin at this time especially given anemia requiring transfusion and thrombocytopenia   Hypomagnesemia.Hypokalemia/hypophosphatemia --- recurrent episodes of hypokalemia and hypomagnesemia ---??  Some component of refeeding syndrome,  -continue to replace and recheck  Hyponatremia  - from poor oral intake and severe dehydration and malignancy associated SIADH -Sodium is up to  133 Improved with IV fluids-- follow BMP.  Vitamin B12 deficiency -  B12 injections   multiple myeloma -bone marrow biopsy confirmed - SPEP highly suggestive with M spike Discussed with Dr. Delton Coombes --Received chemotherapy Cytoxan dose reduced to 200 mg/m2 on 11/02/2020  H/o Hyperlipidemia---Total chol is 57 , so Stopped Crestor  Essential hypertension - holding meds due to soft BPs   BRBPR--- -Had bloody BM most likely from hemorrhoids -GI input requested, recommends [facility]  Generalized weakness/ambulatory dysfunction--- PT eval, recommends SNF rehab,   Social/Ethics--discussed with patient and patient's brother [name], - patient is a full code, -Patient has no payer source to go to SNF rehab -Brother is concerned that he is too weak to go home with home health even if we get a charity home health for him -Patient appears to need 24/7 supervision at this time -Patient and brother looking into possibly paying out-of-pocket for SNF rehab if he cannot get his [employer] from previous employer reinstated --In all likelihood patient's brother and sister-in-law will take him home to their house in [place] on 11/09/2020 with outpatient follow-ups with oncology and PCP  Dispo: The patient is from: Home  Anticipated d/c is to: home with brother on 11/09/20  Patient currently is not medically stable to d/c.              Difficult to place patient No   Consultants:   Hem/onc   cardiology  Procedures:   -Received chemotherapy Cytoxan dose reduced to 200 mg/m2 on 11/02/2020    Nutritional status:  Nutrition Problem: Inadequate oral intake Etiology: inability to eat (confusion, agitation) Signs/Symptoms: NPO status    The patient's BMI is: Body mass index is 22.53 kg/m. I agree with the assessment and plan as outlined below:     SKIN assessment: I agree with skin assessment and plan as outlined     Risk of unplanned readmission  Score: Remains high due to limited resources, uninsured, debility, new onset and diagnosis of multiple myeloma, debility   Discharge Instructions:   Discharge Instructions    Activity as tolerated - No restrictions   Complete by: As directed    Diet - low sodium heart healthy   Complete by: As directed    Discharge instructions   Complete by: As directed    Follow-up with oncologist regarding continue treatment for multiple myeloma Follow with your primary care physician in 1-2 weeks   Discharge wound care:   Complete by: As directed    Per nursing instructions   Increase activity slowly   Complete by: As directed        Medication List    STOP taking these medications   aspirin EC 81 MG tablet   azithromycin 250 MG tablet Commonly known as: ZITHROMAX   fluticasone furoate-vilanterol 200-25 MCG/INH Aepb Commonly known as: BREO ELLIPTA   predniSONE 10 MG tablet Commonly known as: DELTASONE   promethazine-dextromethorphan 6.25-15 MG/5ML syrup Commonly known as: PROMETHAZINE-DM   rosuvastatin 10 MG tablet Commonly known as: Crestor     TAKE these medications   acetaminophen 325 MG tablet Commonly known as: TYLENOL Take 2 tablets (650 mg total) by mouth every 6 (  six) hours as needed for mild pain (or Fever >/= 101).   amLODipine 10 MG tablet Commonly known as: NORVASC Take 1 tablet (10 mg total) by mouth daily.   calcium-vitamin D 500-200 MG-UNIT tablet Commonly known as: OSCAL WITH D Take 1 tablet by mouth 2 (two) times daily.   Emergen-C Immune Plus Pack Take 1 tablet by mouth 2 (two) times daily.   feeding supplement Liqd Take 237 mLs by mouth 3 (three) times daily between meals.   hydrocortisone 25 MG suppository Commonly known as: ANUSOL-HC Place 1 suppository (25 mg total) rectally 2 (two) times daily.   lenalidomide 15 MG capsule Commonly known as: REVLIMID Take 1 capsule (15 mg total) by mouth daily. 14 days on, 7 days off   metoprolol  tartrate 100 MG tablet Commonly known as: LOPRESSOR Take 1 tablet (100 mg total) by mouth 2 (two) times daily.   oxyCODONE 5 MG immediate release tablet Commonly known as: Oxy IR/ROXICODONE Take 1 tablet (5 mg total) by mouth every 4 (four) hours as needed for up to 5 days for moderate pain.       Follow-up [facility] Follow up.   Why:  PT/RN- to call and schedule first home visit.              No Known Allergies   Procedures /Studies:   CT ABDOMEN PELVIS WO CONTRAST  Result Date: 10/30/2020 CLINICAL DATA:  Cancer of unknown primary, multiple lytic lesions in the calvaria noted incidentally. Dizziness, unintended weight loss. EXAM: CT CHEST, ABDOMEN AND PELVIS WITHOUT CONTRAST TECHNIQUE: Multidetector CT imaging of the chest, abdomen and pelvis was performed following the standard protocol without IV contrast. COMPARISON:  CT head 10/29/2020, chest radiograph 10/29/2020 FINDINGS: CT CHEST FINDINGS Cardiovascular: Cardiomegaly. Coronary artery calcifications. Hypoattenuation of the cardiac blood pool compatible with anemia. The aortic root is suboptimally assessed given cardiac pulsation artifact. Atherosclerotic plaque within the normal caliber aorta. Shared origin of the brachiocephalic and left common carotid arteries. Proximal great vessels are otherwise unremarkable. Central pulmonary arteries are top-normal caliber. No large central filling defects within limitations of a nonenhanced exam. Mediastinum/Nodes: No mediastinal fluid or gas. Normal thyroid gland and thoracic inlet. No acute abnormality of the trachea or esophagus. No worrisome mediastinal or axillary adenopathy. Hilar nodal evaluation is limited in the absence of intravenous contrast media. Lungs/Pleura: No consolidation, features of edema, pneumothorax, or effusion. No suspicious pulmonary nodules or masses. Some dependent atelectasis. Evaluation slightly limited by mild respiratory motion  artifact. Musculoskeletal: Widespread extensive lytic mottling throughout the axial and appendicular skeleton. Multiple rib fractures are seen in various stages of healing many of which are presumed to be pathologic in nature. Severe degenerative changes are present in the bilateral shoulders including larger heterotopic ossifications seen in the left shoulder recess. Bridging syndesmophytes across much of the thoracic levels and partial bridging across the spinous processes, could reflect some underlying spondyloarthropathy. CT ABDOMEN PELVIS FINDINGS Hepatobiliary: No visible focal liver lesion with limitations of an unenhanced CT. Smooth surface contour. Normal hepatic attenuation. Normal gallbladder and biliary tree without visible calcified gallstone. Pancreas: Mild pancreatic atrophy. No pancreatic ductal dilatation or surrounding inflammatory changes. Spleen: Splenomegaly within enlarged, lobular spleen. No visible focal splenic lesion within the limitations of this unenhanced exam. Adrenals/Urinary Tract: No adrenal mass or hemorrhage. Slight under rotation of the bilateral kidneys, anatomic variant. No visible or contour deforming renal lesion. No urolithiasis or hydronephrosis. Urinary bladder is largely decompressed at the  time of exam and therefore poorly evaluated by CT imaging. Mild circumferential bladder wall thickening may be related underdistention or chronic outlet obstruction given marked indentation of bladder base by an enlarged prostate. Stomach/Bowel: Distal esophagus, stomach and duodenum are unremarkable. No small bowel thickening or dilatation. Normal appendix in the right lower quadrant. Extensive distal colonic diverticulosis without active acute inflammation to suggest active diverticulitis. Some mild mural thickening in the sigmoid may be reflective of prior inflammation though warrants further evaluation with outpatient colonoscopy if not recently performed. Vascular/Lymphatic:  Atherosclerotic calcifications within the abdominal aorta and branch vessels. No aneurysm or ectasia. No enlarged abdominopelvic lymph nodes. Reproductive: Enlarged prostate with few typically benign punctate calcifications. Trace right hydrocele. Other: No abdominopelvic free air or fluid. No bowel containing hernia. Musculoskeletal: Extensive lytic foci and bony remodeling throughout the axial and appendicular skeleton of the abdomen and pelvis including the proximal femora as well. Bony ankylosis of the L4-L5 and lumbosacral junction as well as across the bilateral SI joints. IMPRESSION: 1. Extensive lytic foci and bony mottling throughout the axial and appendicular skeleton of the chest, abdomen and pelvis including the proximal femora and humeri as well as multiple bilateral rib fractures in various stages of healing many of which are presumed to be pathologic in nature. Findings are concerning for a diffuse osseous metastatic disease versus multiple myeloma. 2. Mild mural thickening in the sigmoid may be reflective of prior inflammation given the numerous colonic diverticula within the segment though warrants further evaluation with outpatient colonoscopy if not recently performed. 3. Splenomegaly within enlarged, lobular spleen, nonspecific but can be seen in the setting of myeloproliferative disorder. 4. Cardiomegaly and coronary artery calcifications. Hypoattenuation of the cardiac blood pool suggestive of anemia. 5. Trace right hydrocele. 6. Mild circumferential bladder wall thickening may be related underdistention or chronic outlet obstruction given marked indentation of bladder base by an enlarged prostate. Could correlate with urinalysis to exclude cystitis. 7. Prostatomegaly. 8. Aortic Atherosclerosis (ICD10-I70.0). Electronically Signed   By: Lovena Le M.D.   On: 10/30/2020 01:15   CT Head Wo Contrast  Result Date: 10/29/2020 CLINICAL DATA:  Dizziness, carbon monoxide exposure 2 months prior  with COVID few weeks ago. Unintended weight loss. EXAM: CT HEAD WITHOUT CONTRAST TECHNIQUE: Contiguous axial images were obtained from the base of the skull through the vertex without intravenous contrast. COMPARISON:  None. FINDINGS: Brain: No evidence of acute infarction, hemorrhage, hydrocephalus, extra-axial collection, visible mass lesion or mass effect. Symmetric prominence of the ventricles, cisterns and sulci compatible with parenchymal volume loss. Patchy areas of white matter hypoattenuation are most compatible with chronic microvascular angiopathy. Vascular: Atherosclerotic calcification of the carotid siphons. No hyperdense vessel. Skull: Mottled appearance of the bone particularly towards the clivus and sphenoid bones with multiple sites of lytic lesions, largest in the posterior left parietal bone measuring 19 x 8 mm (3/42) a slightly more expansile lytic focus is also seen in the right temporal bone measuring 15 x 7 mm (3/19) and in the right mastoid measuring 11 x 12 mm (3/9). Additional scattered foci elsewhere. No calvarial fracture. No scalp swelling or hematoma. Sinuses/Orbits: Nodular mural thickening in the paranasal sinuses, predominantly in the maxillary sinuses. Mastoid air cells are predominantly clear. Middle ear cavities are clear. Other: None IMPRESSION: 1. Mottled appearance of the calvaria and skull base with multitude of lytic lesions concerning for metastatic disease or myeloma. 2. No acute intracranial abnormality is seen within the limitations of an unenhanced CT. Background of chronic microvascular  angiopathy and parenchymal volume loss. These results were called by telephone at the time of interpretation on 10/29/2020 at 10:54 pm to provider [facility] ZAMMIT , who verbally acknowledged these results. Electronically Signed   By: Lovena Le M.D.   On: 10/29/2020 22:56   CT Chest Wo Contrast  Result Date: 10/30/2020 CLINICAL DATA:  Cancer of unknown primary, multiple lytic lesions  in the calvaria noted incidentally. Dizziness, unintended weight loss. EXAM: CT CHEST, ABDOMEN AND PELVIS WITHOUT CONTRAST TECHNIQUE: Multidetector CT imaging of the chest, abdomen and pelvis was performed following the standard protocol without IV contrast. COMPARISON:  CT head 10/29/2020, chest radiograph 10/29/2020 FINDINGS: CT CHEST FINDINGS Cardiovascular: Cardiomegaly. Coronary artery calcifications. Hypoattenuation of the cardiac blood pool compatible with anemia. The aortic root is suboptimally assessed given cardiac pulsation artifact. Atherosclerotic plaque within the normal caliber aorta. Shared origin of the brachiocephalic and left common carotid arteries. Proximal great vessels are otherwise unremarkable. Central pulmonary arteries are top-normal caliber. No large central filling defects within limitations of a nonenhanced exam. Mediastinum/Nodes: No mediastinal fluid or gas. Normal thyroid gland and thoracic inlet. No acute abnormality of the trachea or esophagus. No worrisome mediastinal or axillary adenopathy. Hilar nodal evaluation is limited in the absence of intravenous contrast media. Lungs/Pleura: No consolidation, features of edema, pneumothorax, or effusion. No suspicious pulmonary nodules or masses. Some dependent atelectasis. Evaluation slightly limited by mild respiratory motion artifact. Musculoskeletal: Widespread extensive lytic mottling throughout the axial and appendicular skeleton. Multiple rib fractures are seen in various stages of healing many of which are presumed to be pathologic in nature. Severe degenerative changes are present in the bilateral shoulders including larger heterotopic ossifications seen in the left shoulder recess. Bridging syndesmophytes across much of the thoracic levels and partial bridging across the spinous processes, could reflect some underlying spondyloarthropathy. CT ABDOMEN PELVIS FINDINGS Hepatobiliary: No visible focal liver lesion with limitations  of an unenhanced CT. Smooth surface contour. Normal hepatic attenuation. Normal gallbladder and biliary tree without visible calcified gallstone. Pancreas: Mild pancreatic atrophy. No pancreatic ductal dilatation or surrounding inflammatory changes. Spleen: Splenomegaly within enlarged, lobular spleen. No visible focal splenic lesion within the limitations of this unenhanced exam. Adrenals/Urinary Tract: No adrenal mass or hemorrhage. Slight under rotation of the bilateral kidneys, anatomic variant. No visible or contour deforming renal lesion. No urolithiasis or hydronephrosis. Urinary bladder is largely decompressed at the time of exam and therefore poorly evaluated by CT imaging. Mild circumferential bladder wall thickening may be related underdistention or chronic outlet obstruction given marked indentation of bladder base by an enlarged prostate. Stomach/Bowel: Distal esophagus, stomach and duodenum are unremarkable. No small bowel thickening or dilatation. Normal appendix in the right lower quadrant. Extensive distal colonic diverticulosis without active acute inflammation to suggest active diverticulitis. Some mild mural thickening in the sigmoid may be reflective of prior inflammation though warrants further evaluation with outpatient colonoscopy if not recently performed. Vascular/Lymphatic: Atherosclerotic calcifications within the abdominal aorta and branch vessels. No aneurysm or ectasia. No enlarged abdominopelvic lymph nodes. Reproductive: Enlarged prostate with few typically benign punctate calcifications. Trace right hydrocele. Other: No abdominopelvic free air or fluid. No bowel containing hernia. Musculoskeletal: Extensive lytic foci and bony remodeling throughout the axial and appendicular skeleton of the abdomen and pelvis including the proximal femora as well. Bony ankylosis of the L4-L5 and lumbosacral junction as well as across the bilateral SI joints. IMPRESSION: 1. Extensive lytic foci and  bony mottling throughout the axial and appendicular skeleton of the chest, abdomen and  pelvis including the proximal femora and humeri as well as multiple bilateral rib fractures in various stages of healing many of which are presumed to be pathologic in nature. Findings are concerning for a diffuse osseous metastatic disease versus multiple myeloma. 2. Mild mural thickening in the sigmoid may be reflective of prior inflammation given the numerous colonic diverticula within the segment though warrants further evaluation with outpatient colonoscopy if not recently performed. 3. Splenomegaly within enlarged, lobular spleen, nonspecific but can be seen in the setting of myeloproliferative disorder. 4. Cardiomegaly and coronary artery calcifications. Hypoattenuation of the cardiac blood pool suggestive of anemia. 5. Trace right hydrocele. 6. Mild circumferential bladder wall thickening may be related underdistention or chronic outlet obstruction given marked indentation of bladder base by an enlarged prostate. Could correlate with urinalysis to exclude cystitis. 7. Prostatomegaly. 8. Aortic Atherosclerosis (ICD10-I70.0). Electronically Signed   By: Lovena Le M.D.   On: 10/30/2020 01:15   DG Chest Port 1 View  Result Date: 10/31/2020 CLINICAL DATA:  Altered mental status EXAM: PORTABLE CHEST 1 VIEW COMPARISON:  10/29/2020 FINDINGS: New elevation of the right hemidiaphragm with right basilar pleuroparenchymal process. Chronic interstitial changes. No pneumothorax. Similar cardiomediastinal contours. IMPRESSION: New elevation of the right hemidiaphragm with right basilar atelectasis/consolidation and possible pleural effusion. Electronically Signed   By: Macy Mis M.D.   On: 10/31/2020 11:44   DG Chest Port 1 View  Result Date: 10/29/2020 CLINICAL DATA:  Weakness EXAM: PORTABLE CHEST 1 VIEW COMPARISON:  None. FINDINGS: Cardiac shadow is enlarged. Lungs are well aerated bilaterally. Mild vascular congestion  is noted without interstitial edema. No focal infiltrate or sizable effusion is seen. Degenerative changes of the shoulder joints and thoracic spine are noted. IMPRESSION: Mild vascular congestion without acute infiltrate or edema. Electronically Signed   By: Inez Catalina M.D.   On: 10/29/2020 23:07   CT BONE MARROW BIOPSY  Result Date: 11/01/2020 INDICATION: 65 year old male with history of multiple myeloma. EXAM: CT-GUIDED BONE MARROW BIOPSY AND ASPIRATION MEDICATIONS: None ANESTHESIA/SEDATION: Fentanyl 37.5 mcg IV; Versed 1 mg IV Sedation Time: 16 minutes; The patient was continuously monitored during the procedure by the interventional radiology nurse under my direct supervision. COMPLICATIONS: None immediate. PROCEDURE: Informed consent was obtained from the patient following an explanation of the procedure, risks, benefits and alternatives. The patient understands, agrees and consents for the procedure. All questions were addressed. A time out was performed prior to the initiation of the procedure. The patient was positioned prone and non-contrast localization CT was performed of the pelvis to demonstrate the iliac marrow spaces. The operative site was prepped and draped in the usual sterile fashion. Under sterile conditions and local anesthesia, a 22 gauge spinal needle was utilized for procedural planning. Next, an 11 gauge coaxial bone biopsy needle was advanced into the right iliac marrow space. Needle position was confirmed with CT imaging. Initially, a bone marrow aspiration was performed. Next, a bone marrow biopsy was obtained with the 11 gauge outer bone marrow device. The 11 gauge coaxial bone biopsy needle was re-advanced into a slightly different location within the left iliac marrow space, positioning was confirmed with CT imaging and an additional bone marrow biopsy was obtained. Samples were prepared with the cytotechnologist and deemed adequate. The needle was removed and superficial  hemostasis was obtained with manual compression. A dressing was applied. The patient tolerated the procedure well without immediate post procedural complication. IMPRESSION: Successful CT guided right iliac bone marrow aspiration and core biopsy. Ruthann Cancer, MD  [facility] Electronically Signed   By: Ruthann Cancer MD   On: 11/01/2020 10:52   ECHOCARDIOGRAM COMPLETE  Result Date: 10/30/2020    ECHOCARDIOGRAM REPORT   Patient Name:   SEUNG NIDIFFER Date of Exam: 10/30/2020 Medical Rec #:  [id number]             Height:       69.0 in Accession #:    [id number]            Weight:       151.9 lb Date of Birth:  [birth year]              BSA:          1.838 m Patient Age:    65 years              BP:           129/58 mmHg Patient Gender: M                     HR:           83 bpm. Exam Location:  [facility] Procedure: 2D Echo, Cardiac Doppler and Color Doppler Indications:    Elevated Troponin  History:        Patient has no prior history of Echocardiogram examinations.                 Risk Factors:Hypertension and Dyslipidemia. COVID-19 virus                 infection, Hypercalcemia, Moderate protein-calorie malnutrition,                 AKI (acute kidney injury) ([place]).  Sonographer:    Alvino Chapel RCS Referring Phys: [place]  1. Left ventricular ejection fraction, by estimation, is 60 to 65%. The left ventricle has normal function. The left ventricle has no regional wall motion abnormalities. Left ventricular diastolic parameters are indeterminate. Elevated left atrial pressure.  2. Right ventricular systolic function is mildly reduced. The right ventricular size is moderately enlarged. There is moderately elevated pulmonary artery systolic pressure.  3. Left atrial size was severely dilated.  4. Right atrial size was severely dilated.  5. The mitral valve is normal in structure. Mild mitral valve regurgitation. No evidence  of mitral stenosis.  6. The aortic valve is tricuspid. Aortic valve regurgitation is not visualized. No aortic stenosis is present.  7. The inferior vena cava is dilated in size with <50% respiratory variability, suggesting right atrial pressure of 15 mmHg. FINDINGS  Left Ventricle: Left ventricular ejection fraction, by estimation, is 60 to 65%. The left ventricle has normal function. The left ventricle has no regional wall motion abnormalities. The left ventricular internal cavity size was normal in size. There is  no left ventricular hypertrophy. Left ventricular diastolic parameters are indeterminate. Elevated left atrial pressure. Right Ventricle: The right ventricular size is moderately enlarged. Right vetricular wall thickness was not assessed. Right ventricular systolic function is mildly reduced. There is moderately elevated pulmonary artery systolic pressure. The tricuspid regurgitant velocity is 2.99 m/s, and with an assumed right atrial pressure of 15 mmHg, the estimated right ventricular systolic pressure is 88.8 mmHg. Left Atrium: Left atrial size was severely dilated. Right Atrium: Right atrial size was severely dilated. Pericardium: There is no evidence of pericardial effusion. Mitral Valve: The mitral valve is normal in structure. Mild mitral valve regurgitation. No evidence  of mitral valve stenosis. Tricuspid Valve: The tricuspid valve is normal in structure. Tricuspid valve regurgitation is mild . No evidence of tricuspid stenosis. Aortic Valve: The aortic valve is tricuspid. Aortic valve regurgitation is not visualized. No aortic stenosis is present. Aortic valve mean gradient measures 5.8 mmHg. Aortic valve peak gradient measures 12.0 mmHg. Aortic valve area, by VTI measures 3.01  cm. Pulmonic Valve: The pulmonic valve was not well visualized. Pulmonic valve regurgitation is not visualized. No evidence of pulmonic stenosis. Aorta: The aortic root is normal in size and structure. Pulmonary  Artery: Moderate pulmonary HTN, PASP is 51 mmHg. Venous: The inferior vena cava is dilated in size with less than 50% respiratory variability, suggesting right atrial pressure of 15 mmHg. IAS/Shunts: No atrial level shunt detected by color flow Doppler.  LEFT VENTRICLE PLAX 2D LVIDd:         5.20 cm  Diastology LVIDs:         3.50 cm  LV e' medial:    6.20 cm/s LV PW:         0.90 cm  LV E/e' medial:  23.2 LV IVS:        1.10 cm  LV e' lateral:   13.80 cm/s LVOT diam:     2.20 cm  LV E/e' lateral: 10.4 LV SV:         98 LV SV Index:   53 LVOT Area:     3.80 cm  RIGHT VENTRICLE RV S prime:     15.90 cm/s TAPSE (M-mode): 2.3 cm LEFT ATRIUM              Index       RIGHT ATRIUM           Index LA diam:        4.90 cm  2.67 cm/m  RA Area:     27.80 cm LA Vol (A2C):   141.0 ml 76.71 ml/m RA Volume:   96.80 ml  52.67 ml/m LA Vol (A4C):   128.0 ml 69.64 ml/m LA Biplane Vol: 138.0 ml 75.08 ml/m  AORTIC VALVE AV Area (Vmax):    2.79 cm AV Area (Vmean):   2.74 cm AV Area (VTI):     3.01 cm AV Vmax:           172.87 cm/s AV Vmean:          112.345 cm/s AV VTI:            0.324 m AV Peak Grad:      12.0 mmHg AV Mean Grad:      5.8 mmHg LVOT Vmax:         127.00 cm/s LVOT Vmean:        80.900 cm/s LVOT VTI:          0.257 m LVOT/AV VTI ratio: 0.79  AORTA Ao Root diam: 3.00 cm MITRAL VALVE                 TRICUSPID VALVE MV Area (PHT): 4.54 cm      TR Peak grad:   35.8 mmHg MV Decel Time: 167 msec      TR Vmax:        299.00 cm/s MR Peak grad:    94.9 mmHg MR Mean grad:    62.0 mmHg   SHUNTS MR Vmax:         487.00 cm/s Systemic VTI:  0.26 m MR Vmean:        371.0 cm/s  Systemic Diam: 2.20 cm MR PISA:         2.26 cm MR PISA Eff ROA: 15 mm MR PISA Radius:  0.60 cm MV E velocity: 144.00 cm/s MV A velocity: 45.90 cm/s MV E/A ratio:  3.14 Carlyle Dolly MD Electronically signed by Carlyle Dolly MD Signature Date/Time: 10/30/2020/12:07:16 PM    Final     Subjective:   Patient was seen and examined 11/09/2020, 11:13  AM Patient stable today. No acute distress.  No issues overnight Stable for discharge.  Discharge Exam:    Vitals:   11/08/20 2050 11/09/20 0336 11/09/20 0500 11/09/20 0821  BP: (!) 107/54 130/75    Pulse: 66 74    Resp: 18 20    Temp: 98.2 F (36.8 C) 98.6 F (37 C)    TempSrc:      SpO2: 97% 99%  97%  Weight:   69.2 kg   Height:        General: Pt lying comfortably in bed & appears in no obvious distress. Cardiovascular: S1 & S2 heard, RRR, S1/S2 +. No murmurs, rubs, gallops or clicks. No JVD or pedal edema. Respiratory: Clear to auscultation without wheezing, rhonchi or crackles. No increased work of breathing. Abdominal:  Non-distended, non-tender & soft. No organomegaly or masses appreciated. Normal bowel sounds heard. CNS: Alert and oriented. No focal deficits. Extremities: no edema, no cyanosis      The results of significant diagnostics from this hospitalization (including imaging, microbiology, ancillary and laboratory) are listed below for reference.      Microbiology:   No results found for this or any previous visit (from the past 240 hour(s)).   Labs:   CBC: Recent Labs  Lab 11/04/20 0517 11/05/20 0451 11/06/20 0532 11/08/20 0712 11/09/20 0537  WBC 6.4 6.1 5.0 3.9* 4.2  HGB 9.1* 8.9* 8.3* 8.0* 8.1*  HCT 26.8* 26.7* 25.0* 24.6* 25.7*  MCV 93.7 94.3 95.1 97.6 98.1  PLT 82* 81* 82* 102* 195*   Basic Metabolic Panel: Recent Labs  Lab 11/03/20 0625 11/03/20 0625 11/04/20 0517 11/04/20 1306 11/05/20 0451 11/06/20 0532 11/07/20 0429 11/08/20 0712 11/09/20 0537  NA 130*  --  132*  --  132* 134* 133* 133* 133*  K 3.0*  --  2.5*   < > 2.9* 2.8* 2.9* 4.1 3.9  CL 104  --  104  --  105 106 103 106 105  CO2 21*  --  23  --  _0 GLUCOSE 139*  --  88  --  80 117* 110* 89 108*  BUN 67*  --  67*  --  50* 42* 29* 21 19  CREATININE 2.48*  --  2.15*  --  1.81* 1.48* 1.28* 1.21 1.07  CALCIUM 7.2*  --  6.8*  --  6.3* 6.6* 6.5* 6.1* 6.1*  MG  1.5*  --  2.0  --   --  1.3*  --   --   --   PHOS  --    < > 2.1*  --  1.9* 1.2* 1.3* 1.6* 1.5*   < > = values in this interval not displayed.   Liver Function Tests: Recent Labs  Lab 11/03/20 0932 11/04/20 0517 11/05/20 0451 11/06/20 0532 11/07/20 0429 11/08/20 0712 11/09/20 0537  AST 29  --   --   --   --   --   --   ALT 16  --   --   --   --   --   --  ALKPHOS 47  --   --   --   --   --   --   BILITOT 1.1  --   --   --   --   --   --   PROT 8.9*  --   --   --   --   --   --   ALBUMIN 1.8*   < > 1.8* 1.8* 2.0* 1.8* 1.9*   < > = values in this interval not displayed.   BNP (last 3 results) Recent Labs    10/31/20 0432  BNP 883.0*   Cardiac Enzymes: No results for input(s): CKTOTAL, CKMB, CKMBINDEX, TROPONINI in the last 168 hours. CBG: No results for input(s): GLUCAP in the last 168 hours. Hgb A1c No results for input(s): HGBA1C in the last 72 hours. Lipid Profile No results for input(s): CHOL, HDL, LDLCALC, TRIG, CHOLHDL, LDLDIRECT in the last 72 hours. Thyroid function studies No results for input(s): TSH, T4TOTAL, T3FREE, THYROIDAB in the last 72 hours.  Invalid input(s): FREET3 Anemia work up No results for input(s): VITAMINB12, FOLATE, FERRITIN, TIBC, IRON, RETICCTPCT in the last 72 hours. Urinalysis    Component Value Date/Time   COLORURINE YELLOW 10/29/2020 2356   APPEARANCEUR HAZY (A) 10/29/2020 2356   LABSPEC 1.014 10/29/2020 2356   PHURINE 7.0 10/29/2020 2356   GLUCOSEU NEGATIVE 10/29/2020 2356   HGBUR NEGATIVE 10/29/2020 2356   BILIRUBINUR NEGATIVE 10/29/2020 2356   KETONESUR NEGATIVE 10/29/2020 2356   PROTEINUR 100 (A) 10/29/2020 2356   NITRITE NEGATIVE 10/29/2020 2356   LEUKOCYTESUR NEGATIVE 10/29/2020 2356         Time coordinating discharge: Over 45 minutes  SIGNED: Deatra James, MD, FACP, FHM. [facility],  Please use [email or web address] to Page If 7PM-7AM, please contact night-coverage [email or web address], Password [facility] 11/09/2020,  11:13 AM

## 2020-11-09 NOTE — Telephone Encounter (Signed)
Pt is coming in Monday and brother or sister is coming with him

## 2020-11-10 ENCOUNTER — Other Ambulatory Visit (HOSPITAL_COMMUNITY): Payer: Self-pay | Admitting: Hematology

## 2020-11-10 ENCOUNTER — Telehealth: Payer: Self-pay

## 2020-11-10 NOTE — Progress Notes (Signed)
START ON PATHWAY REGIMEN - Multiple Myeloma and Other Plasma Cell Dyscrasias   DaraVRd (Daratumumab IV + Bortezomib SUBQ + Lenalidomide PO + Dexamethasone IV/PO) q21 Days (Induction Schema):   A cycle is every 21 days:     Lenalidomide      Dexamethasone      Bortezomib      Daratumumab   **Always confirm dose/schedule in your pharmacy ordering system**  DaraVRd (Daratumumab IV + Bortezomib SUBQ + Lenalidomide PO + Dexamethasone IV/PO) q21 Days (Consolidation Schema):   A cycle is every 21 days:     Lenalidomide      Dexamethasone      Bortezomib      Daratumumab   **Always confirm dose/schedule in your pharmacy ordering system**  Patient Characteristics: Multiple Myeloma, Newly Diagnosed, Transplant Eligible, High Risk Disease Classification: Multiple Myeloma R-ISS Staging: III Therapeutic Status: Newly Diagnosed Is Patient Eligible for Transplant<= Transplant Eligible Risk Status: High Risk Intent of Therapy: Non-Curative / Palliative Intent, Discussed with Patient

## 2020-11-10 NOTE — Telephone Encounter (Signed)
I called pt. Per my TOC report he was already scheduled for a hospital f/u on 11/14/20, medications were gone over and reconciled.

## 2020-11-11 ENCOUNTER — Encounter (HOSPITAL_COMMUNITY): Payer: Self-pay | Admitting: Hematology

## 2020-11-14 ENCOUNTER — Encounter: Payer: Self-pay | Admitting: Medical

## 2020-11-14 ENCOUNTER — Ambulatory Visit (INDEPENDENT_AMBULATORY_CARE_PROVIDER_SITE_OTHER): Payer: Commercial Managed Care - PPO | Admitting: Medical

## 2020-11-14 ENCOUNTER — Other Ambulatory Visit (HOSPITAL_COMMUNITY): Payer: Self-pay

## 2020-11-14 ENCOUNTER — Other Ambulatory Visit: Payer: Self-pay

## 2020-11-14 VITALS — BP 138/64 | HR 118 | Ht 71.0 in | Wt 148.4 lb

## 2020-11-14 DIAGNOSIS — C9000 Multiple myeloma not having achieved remission: Secondary | ICD-10-CM

## 2020-11-14 DIAGNOSIS — E440 Moderate protein-calorie malnutrition: Secondary | ICD-10-CM | POA: Diagnosis not present

## 2020-11-14 DIAGNOSIS — E871 Hypo-osmolality and hyponatremia: Secondary | ICD-10-CM

## 2020-11-14 DIAGNOSIS — Z9189 Other specified personal risk factors, not elsewhere classified: Secondary | ICD-10-CM

## 2020-11-14 DIAGNOSIS — D649 Anemia, unspecified: Secondary | ICD-10-CM

## 2020-11-14 DIAGNOSIS — G9341 Metabolic encephalopathy: Secondary | ICD-10-CM

## 2020-11-14 DIAGNOSIS — Z9289 Personal history of other medical treatment: Secondary | ICD-10-CM

## 2020-11-14 DIAGNOSIS — Z8616 Personal history of COVID-19: Secondary | ICD-10-CM

## 2020-11-14 DIAGNOSIS — R5383 Other fatigue: Secondary | ICD-10-CM

## 2020-11-14 DIAGNOSIS — I10 Essential (primary) hypertension: Secondary | ICD-10-CM

## 2020-11-14 DIAGNOSIS — R634 Abnormal weight loss: Secondary | ICD-10-CM

## 2020-11-14 DIAGNOSIS — N179 Acute kidney failure, unspecified: Secondary | ICD-10-CM

## 2020-11-14 NOTE — Telephone Encounter (Signed)
Received email and phone call from [place] that patient has been referred to [place] [facility].    I will follow up with [employer] on Wednesday to check the status of his application.    [employer] phone number [phone].  [place] [facility] [place] Phone [phone] Fax [phone] 11/14/2020 9:28 AM

## 2020-11-14 NOTE — Progress Notes (Signed)
Subjective: Chief Complaint  Patient presents with  . Hospitalization Follow-up    Follow up on recent imaging    For hospital follow-up.  His sister-in -law brought him today and she is in a separate room to ask me a few questions as he did not want her in the same room for our discussion.  He decide for her to be on HIPPA  today as well as his brother-in-law Mark.   He notes that when we last talked back in February when he had COVID infection, he had also had a concern for carbon monoxide exposure separate from the COVID infection.  We had also discussed his abnormal labs and need for further evaluation although he declined to see a specialist that time to have further evaluation.  Over the subsequent weeks he noticed that he became weak, decreased appetite, and ongoing bad taste in his mouth since the February 2022 televisit  interfering with his ability to eat.  He notes that he had become weak in confused and neighbors were concerned about him wanting him to go to the hospital sooner but he refused.  Ultimately his brother took him to the hospital recently for severe dehydration and weakness and confusion  He was admitted 10/29/20 - 11/09/20 for hypercalcemia, malnutrition, dehydration, multiple myeloma and metastatic malignancy, hyponatremia, metabolic encephalopathy.  Currently he still feels fairly fatigued, but slightly improved from the hospital visit.  He has occasional cough that he says is more of a clearing of throat from the bad taste in his mouth.  He also has occasional nausea.  He is still using some leftover Promethazine DM cough syrup He notes trouble sleeping, fatigue, ongoing nasty taste in mouth.  He has questions about chemotherapy.   He has apparently went to a funeral home and picked out a casket and done some initial end of life planning.   He is still planning to go to oncology and pursue therapy.  He would like improvements in the nasty taste in his mouth to tolerate  food.   This nasty taste has been present for a few months.     Separately his sister-in-law had questions about when to restart his blood pressure medicine.  His blood pressures have been running low in recent days so they never started back on amlodipine or metoprolol although this was stated in the discharge summary  She wants to make sure he can take plain Tylenol for pain.  She notes that he will not let her or his brother Mark check his oxygen or blood pressure.    She notes they completed an updated power of attorney and she is second in line on the healthcare power of attorney  He goes tomorrow for his first class and oncology regarding chemotherapy.  Has infusion planned for Wednesday, and this will be his second chemotherapy  She notes that around the time he did the tele visit with me in February, his employer terminated him from employment due to him missing work for illness.  However she notes in retrospect that he was confused and not in his normal frame of mind and should not of been terminated.  Consequently he lost his insurance   No other aggravating or relieving factors. No other complaint.  Past Medical History:  Diagnosis Date  . Colonoscopy refused 09/2017  . Hyperlipidemia   . Hypertension   . Refuses treatment 09/2017   refuses screenings such as cancer screening, refuses vaccines, refuses normal preventative care  . Vaccine refused   by patient    all vaccines as of 09/2017   No current facility-administered medications on file prior to visit.   Current Outpatient Medications on File Prior to Visit  Medication Sig Dispense Refill  . acetaminophen (TYLENOL) 325 MG tablet Take 2 tablets (650 mg total) by mouth every 6 (six) hours as needed for mild pain (or Fever >/= 101). 30 tablet 30  . calcium-vitamin D (OSCAL WITH D) 500-200 MG-UNIT tablet Take 1 tablet by mouth 2 (two) times daily. 60 tablet 0  . feeding supplement (ENSURE ENLIVE / ENSURE PLUS) LIQD Take 237  mLs by mouth 3 (three) times daily between meals. 237 mL 12  . Multiple Vitamins-Minerals (EMERGEN-C IMMUNE PLUS) PACK Take 1 tablet by mouth 2 (two) times daily. 60 each 0  . amLODipine (NORVASC) 10 MG tablet Take 1 tablet (10 mg total) by mouth daily. (Patient not taking: No sig reported) 90 tablet 3  . hydrocortisone (ANUSOL-HC) 25 MG suppository Place 1 suppository (25 mg total) rectally 2 (two) times daily. (Patient not taking: No sig reported) 12 suppository 0  . metoprolol tartrate (LOPRESSOR) 100 MG tablet Take 1 tablet (100 mg total) by mouth 2 (two) times daily. (Patient not taking: No sig reported) 180 tablet 3   ROS as in subjective    Objective: BP 138/64   Pulse (!) 118   Ht 5' 11" (1.803 m)   Wt 148 lb 6.4 oz (67.3 kg)   SpO2 91%   BMI 20.70 kg/m    Wt Readings from Last 3 Encounters:  11/15/20 147 lb 11.3 oz (67 kg)  11/14/20 148 lb 6.4 oz (67.3 kg)  11/09/20 152 lb 8.9 oz (69.2 kg)   General appearence: alert, no distress, malnourished appearing, somewhat fatigued appearing Skin: somewhat pale, no diaphoresis HEENT: normocephalic, sclerae anicteric, TMs pearly, nares patent, no discharge or erythema, pharynx normal Oral cavity: somewhat dry MM, no lesions Neck: supple, no lymphadenopathy, no thyromegaly, no masses Heart:  mildly tachycardiac, otherwise RRR, normal S1, S2, no murmurs Lungs: clear,no obvious wheezes, rhonchi, or rales Ext: no edema Pulses: 2+ symmetric, upper and lower extremities, normal cap refill     Assessment: Encounter Diagnoses  Name Primary?  . Moderate protein-calorie malnutrition ([place]) Yes  . Multiple myeloma not having achieved remission ([place])   . Hyponatremia   . Hypercalcemia   . Essential hypertension, benign   . AKI (acute kidney injury) ([place])   . Acute metabolic encephalopathy   . History of COVID-19   . History of exposure to respiratory irritant   . Fatigue, unspecified type   . Weight loss   . Anemia, unspecified  type   . History of recent hospitalization      Plan: I reviewed the recent discharge summary.  He had significant illness, dehydration, malnutrition, abnormal lab values, and untreated malignancy and multiple myeloma leading up to this hospitalization.  I reviewed medications which are reconciled.  I reviewed all imaging and lab data.  I reviewed consult notes.  We discussed him continuing plan to see oncology tomorrow and to continue with treatment recommendations.  I recommended he not give up hope.   I advised that it is reasonable to go ahead and have a plan for final arrangements and advanced directives if things turn for the worse, but not to give up  I advised that if blood pressures are trending over 140/90 or pulse trending however 100 regularly in the next week then call back and we will add back metoprolol  first.  He is not currently taking any blood pressure medicine due to low recent blood pressure readings  Continue vitamin pack, continue multivitamin, vitamin D.  He will use Promethazine DM sparingly as needed for cough or nausea  We discussed that sense of taste and smell may not resolve for weeks or months to come.  I did give him a regimen of essential oils that has shown some benefit for people that are status post COVID  We discussed activity as tolerated.  He will have follow-up labs next week when he sees oncology  His family is currently helping with ADLs, food, medication compliance.  He Will Continue Meal Supplement Drinks 2-3 Times Daily.  We discussed that oncology will take over most of his care at this point other than BP management and the acute issue that may arise.   Addendum Later on the day 5/3/ 2022 as I was reviewing charts and finalizing his chart notes, it was apparent that he had an x-ray today on May 3.  I called and spoke to Tammy his sister-in-law.  They are currently sitting at the emergency department due to acute worsening of shortness of  breath, fatigue, difficulty breathing over the last 24 hours.  His x-ray in the chart record from today shows pneumonia.  He will likely be admitted  Spent > 45 minutes face to face with patient in discussion of symptoms, evaluation, plan and recommendations.      Virgal was seen today for hospitalization follow-up.  Diagnoses and all orders for this visit:  Moderate protein-calorie malnutrition (H[facility])  Multiple myeloma not having achieved remission (H[facility])  Hyponatremia  Hypercalcemia  Essential hypertension, benign  AKI (acute kidney injury) ([facility])  Acute metabolic encephalopathy  History of COVID-19  History of exposure to respiratory irritant  Fatigue, unspecified type  Weight loss  Anemia, unspecified type  History of recent hospitalization  follow up with oncology office tomorrow.        

## 2020-11-14 NOTE — Progress Notes (Signed)
The following Medication: Velcade is approved for drug replacement program by [employer]. The enrollment period is from 11/14/2020 to 11/13/2021.  Reason for Assistance: Self. ID: [id number] First DOS:11/15/2020. Madalyn Rob, CPhT IV Drug Replacement Specialist  [place] Phone: [phone]

## 2020-11-14 NOTE — Telephone Encounter (Signed)
[employer] sent a fax requesting patients household size and income documents.  Faxed back no income letter that was notarized and included household size.  Pending response from [employer] if that is sufficient since patient is unemployed and depending on family and friends help.  [place] [facility] [place] Phone [phone] Fax [phone] 11/14/2020 1:19 PM

## 2020-11-14 NOTE — Patient Instructions (Addendum)
Blood pressure  Monitor blood pressures and pulse at home  If BP is staying 140/90 or higher then we would start back on Metoprolol , but call first if BP is consistently over 140/90  If pulse is staying over 110 regularly, also let me know as we would start back on lower dose metoprolol  DO NOT start back on blood pressure medicaiton just yet without monitoring and communicating with me or cancer doctor  Nausea/cough - you can use the Promethazine cough syrup you have at home currently  I wrote your jury duty letter today   Using essential oils for loss of smell and/or taste.   There are numerous articles in the news about re-training you nose with essential oils.   This is suggested approach from Dr. Francee Piccolo out of [place], [place].    It is not entirely clear why COVID-19 causes loss of sense of smell, but its likely because viruses, like the coronavirus, can damage nerves and COVID-19 enters and lives inside the nose.   One study cited over 86% of patients with mild to moderate covid symptoms lose smell or taste.  To help retrain the nose, here is a recommended approach.   . Purchase four essential oils; clove, eucalyptus, rose and lemon.  . Take the oils one at a time and put them on a cotton ball then breathe in the scent. . Focus for about 30 seconds and take deep breaths and just think about the scent . Repeat this three times a day for three to four weeks.

## 2020-11-14 NOTE — Progress Notes (Signed)
The following Medication: Darzalex FasPro has been approved thru [employer] as Assistance Program. Enrollment period is 11/14/2020 to 11/14/2021.  Assistance ID: [id number]. Reason for Assistance: Self First DOS: 11/15/2020 Madalyn Rob, CPhT IV Drug Replacement Specialist  [place] Phone: [phone]

## 2020-11-15 ENCOUNTER — Inpatient Hospital Stay (HOSPITAL_COMMUNITY): Payer: Commercial Managed Care - PPO | Admitting: General Practice

## 2020-11-15 ENCOUNTER — Other Ambulatory Visit: Payer: Self-pay

## 2020-11-15 ENCOUNTER — Inpatient Hospital Stay (HOSPITAL_COMMUNITY): Payer: Commercial Managed Care - PPO | Attending: Hematology

## 2020-11-15 ENCOUNTER — Inpatient Hospital Stay (HOSPITAL_COMMUNITY)
Admission: EM | Admit: 2020-11-15 | Discharge: 2020-11-16 | DRG: 193 | Disposition: A | Discharge status: Home / Self Care | Payer: Commercial Managed Care - PPO | Attending: Internal Medicine | Admitting: Internal Medicine

## 2020-11-15 ENCOUNTER — Telehealth: Payer: Self-pay | Admitting: Medical

## 2020-11-15 ENCOUNTER — Emergency Department (HOSPITAL_COMMUNITY): Payer: Commercial Managed Care - PPO

## 2020-11-15 ENCOUNTER — Encounter (HOSPITAL_COMMUNITY): Payer: Self-pay

## 2020-11-15 ENCOUNTER — Encounter (HOSPITAL_COMMUNITY): Payer: Self-pay | Admitting: Emergency Medicine

## 2020-11-15 ENCOUNTER — Inpatient Hospital Stay (HOSPITAL_COMMUNITY): Payer: Commercial Managed Care - PPO

## 2020-11-15 ENCOUNTER — Other Ambulatory Visit (HOSPITAL_COMMUNITY): Payer: Self-pay

## 2020-11-15 DIAGNOSIS — Z8249 Family history of ischemic heart disease and other diseases of the circulatory system: Secondary | ICD-10-CM

## 2020-11-15 DIAGNOSIS — Z9289 Personal history of other medical treatment: Secondary | ICD-10-CM | POA: Insufficient documentation

## 2020-11-15 DIAGNOSIS — Z6822 Body mass index (BMI) 22.0-22.9, adult: Secondary | ICD-10-CM

## 2020-11-15 DIAGNOSIS — Z20822 Contact with and (suspected) exposure to covid-19: Secondary | ICD-10-CM | POA: Diagnosis present

## 2020-11-15 DIAGNOSIS — E871 Hypo-osmolality and hyponatremia: Secondary | ICD-10-CM | POA: Diagnosis present

## 2020-11-15 DIAGNOSIS — C799 Secondary malignant neoplasm of unspecified site: Secondary | ICD-10-CM

## 2020-11-15 DIAGNOSIS — J9601 Acute respiratory failure with hypoxia: Secondary | ICD-10-CM | POA: Diagnosis present

## 2020-11-15 DIAGNOSIS — Z79899 Other long term (current) drug therapy: Secondary | ICD-10-CM | POA: Diagnosis not present

## 2020-11-15 DIAGNOSIS — E860 Dehydration: Secondary | ICD-10-CM | POA: Diagnosis present

## 2020-11-15 DIAGNOSIS — Z66 Do not resuscitate: Secondary | ICD-10-CM | POA: Diagnosis present

## 2020-11-15 DIAGNOSIS — J189 Pneumonia, unspecified organism: Secondary | ICD-10-CM

## 2020-11-15 DIAGNOSIS — R64 Cachexia: Secondary | ICD-10-CM | POA: Diagnosis present

## 2020-11-15 DIAGNOSIS — N179 Acute kidney failure, unspecified: Secondary | ICD-10-CM | POA: Diagnosis present

## 2020-11-15 DIAGNOSIS — C9000 Multiple myeloma not having achieved remission: Secondary | ICD-10-CM | POA: Diagnosis present

## 2020-11-15 DIAGNOSIS — E43 Unspecified severe protein-calorie malnutrition: Secondary | ICD-10-CM | POA: Diagnosis present

## 2020-11-15 DIAGNOSIS — Z8616 Personal history of COVID-19: Secondary | ICD-10-CM | POA: Diagnosis not present

## 2020-11-15 DIAGNOSIS — Z515 Encounter for palliative care: Secondary | ICD-10-CM

## 2020-11-15 DIAGNOSIS — Z5112 Encounter for antineoplastic immunotherapy: Secondary | ICD-10-CM | POA: Insufficient documentation

## 2020-11-15 DIAGNOSIS — E876 Hypokalemia: Secondary | ICD-10-CM | POA: Diagnosis present

## 2020-11-15 DIAGNOSIS — D649 Anemia, unspecified: Secondary | ICD-10-CM | POA: Insufficient documentation

## 2020-11-15 DIAGNOSIS — E785 Hyperlipidemia, unspecified: Secondary | ICD-10-CM | POA: Diagnosis present

## 2020-11-15 DIAGNOSIS — I10 Essential (primary) hypertension: Secondary | ICD-10-CM | POA: Diagnosis present

## 2020-11-15 DIAGNOSIS — Z9189 Other specified personal risk factors, not elsewhere classified: Secondary | ICD-10-CM | POA: Insufficient documentation

## 2020-11-15 DIAGNOSIS — R5383 Other fatigue: Secondary | ICD-10-CM | POA: Insufficient documentation

## 2020-11-15 DIAGNOSIS — A419 Sepsis, unspecified organism: Secondary | ICD-10-CM

## 2020-11-15 DIAGNOSIS — R634 Abnormal weight loss: Secondary | ICD-10-CM | POA: Insufficient documentation

## 2020-11-15 LAB — CBC WITH DIFFERENTIAL/PLATELET
Abs Immature Granulocytes: 0.03 10*3/uL (ref 0.00–0.07)
Abs Immature Granulocytes: 0.08 10*3/uL — ABNORMAL HIGH (ref 0.00–0.07)
Basophils Absolute: 0 10*3/uL (ref 0.0–0.1)
Basophils Absolute: 0 10*3/uL (ref 0.0–0.1)
Basophils Relative: 1 %
Basophils Relative: 1 %
Eosinophils Absolute: 0 10*3/uL (ref 0.0–0.5)
Eosinophils Absolute: 0 10*3/uL (ref 0.0–0.5)
Eosinophils Relative: 0 %
Eosinophils Relative: 0 %
HCT: 24.8 % — ABNORMAL LOW (ref 39.0–52.0)
HCT: 24.4 % — ABNORMAL LOW (ref 39.0–52.0)
Hemoglobin: 8.2 g/dL — ABNORMAL LOW (ref 13.0–17.0)
Hemoglobin: 8.1 g/dL — ABNORMAL LOW (ref 13.0–17.0)
Immature Granulocytes: 1 %
Immature Granulocytes: 2 %
Lymphocytes Relative: 7 %
Lymphocytes Relative: 9 %
Lymphs Abs: 0.3 10*3/uL — ABNORMAL LOW (ref 0.7–4.0)
Lymphs Abs: 0.4 10*3/uL — ABNORMAL LOW (ref 0.7–4.0)
MCH: 31.2 pg (ref 26.0–34.0)
MCH: 30.9 pg (ref 26.0–34.0)
MCHC: 33.1 g/dL (ref 30.0–36.0)
MCHC: 33.2 g/dL (ref 30.0–36.0)
MCV: 94.3 fL (ref 80.0–100.0)
MCV: 93.1 fL (ref 80.0–100.0)
Monocytes Absolute: 0.4 10*3/uL (ref 0.1–1.0)
Monocytes Absolute: 0.3 10*3/uL (ref 0.1–1.0)
Monocytes Relative: 9 %
Monocytes Relative: 7 %
Neutro Abs: 3.2 10*3/uL (ref 1.7–7.7)
Neutro Abs: 3.3 10*3/uL (ref 1.7–7.7)
Neutrophils Relative %: 82 %
Neutrophils Relative %: 81 %
Platelets: 156 10*3/uL (ref 150–400)
Platelets: 153 10*3/uL (ref 150–400)
RBC: 2.63 MIL/uL — ABNORMAL LOW (ref 4.22–5.81)
RBC: 2.62 MIL/uL — ABNORMAL LOW (ref 4.22–5.81)
RDW: 19.5 % — ABNORMAL HIGH (ref 11.5–15.5)
RDW: 19.5 % — ABNORMAL HIGH (ref 11.5–15.5)
WBC: 3.9 10*3/uL — ABNORMAL LOW (ref 4.0–10.5)
WBC: 4 10*3/uL (ref 4.0–10.5)
nRBC: 0 % (ref 0.0–0.2)
nRBC: 0 % (ref 0.0–0.2)

## 2020-11-15 LAB — COMPREHENSIVE METABOLIC PANEL
ALT: 36 U/L (ref 0–44)
ALT: 34 U/L (ref 0–44)
AST: 36 U/L (ref 15–41)
AST: 36 U/L (ref 15–41)
Albumin: 2 g/dL — ABNORMAL LOW (ref 3.5–5.0)
Albumin: 2 g/dL — ABNORMAL LOW (ref 3.5–5.0)
Alkaline Phosphatase: 60 U/L (ref 38–126)
Alkaline Phosphatase: 58 U/L (ref 38–126)
Anion gap: 8 (ref 5–15)
Anion gap: 7 (ref 5–15)
BUN: 29 mg/dL — ABNORMAL HIGH (ref 8–23)
BUN: 30 mg/dL — ABNORMAL HIGH (ref 8–23)
CO2: 22 mmol/L (ref 22–32)
CO2: 22 mmol/L (ref 22–32)
Calcium: 7.1 mg/dL — ABNORMAL LOW (ref 8.9–10.3)
Calcium: 6.8 mg/dL — ABNORMAL LOW (ref 8.9–10.3)
Chloride: 97 mmol/L — ABNORMAL LOW (ref 98–111)
Chloride: 97 mmol/L — ABNORMAL LOW (ref 98–111)
Creatinine, Ser: 1.67 mg/dL — ABNORMAL HIGH (ref 0.61–1.24)
Creatinine, Ser: 1.67 mg/dL — ABNORMAL HIGH (ref 0.61–1.24)
GFR, Estimated: 46 mL/min — ABNORMAL LOW (ref >60)
GFR, Estimated: 46 mL/min — ABNORMAL LOW (ref >60)
Glucose, Bld: 113 mg/dL — ABNORMAL HIGH (ref 70–99)
Glucose, Bld: 119 mg/dL — ABNORMAL HIGH (ref 70–99)
Potassium: 2.7 mmol/L — CL (ref 3.5–5.1)
Potassium: 2.5 mmol/L — CL (ref 3.5–5.1)
Sodium: 127 mmol/L — ABNORMAL LOW (ref 135–145)
Sodium: 126 mmol/L — ABNORMAL LOW (ref 135–145)
Total Bilirubin: 1 mg/dL (ref 0.3–1.2)
Total Bilirubin: 0.9 mg/dL (ref 0.3–1.2)
Total Protein: 9.9 g/dL — ABNORMAL HIGH (ref 6.5–8.1)
Total Protein: 10 g/dL — ABNORMAL HIGH (ref 6.5–8.1)

## 2020-11-15 LAB — RESP PANEL BY RT-PCR (FLU A&B, COVID) ARPGX2
Influenza A by PCR: NEGATIVE
Influenza B by PCR: NEGATIVE
SARS Coronavirus 2 by RT PCR: NEGATIVE

## 2020-11-15 LAB — PROTIME-INR
INR: 1.3 — ABNORMAL HIGH (ref 0.8–1.2)
Prothrombin Time: 15.7 seconds — ABNORMAL HIGH (ref 11.4–15.2)

## 2020-11-15 LAB — D-DIMER, QUANTITATIVE: D-Dimer, Quant: 3.31 ug/mL-FEU — ABNORMAL HIGH (ref 0.00–0.50)

## 2020-11-15 LAB — MAGNESIUM
Magnesium: 1 mg/dL — ABNORMAL LOW (ref 1.7–2.4)
Magnesium: 1 mg/dL — ABNORMAL LOW (ref 1.7–2.4)

## 2020-11-15 LAB — TYPE AND SCREEN
ABO/RH(D): B POS
Antibody Screen: NEGATIVE

## 2020-11-15 LAB — LACTIC ACID, PLASMA
Lactic Acid, Venous: 1.6 mmol/L (ref 0.5–1.9)
Lactic Acid, Venous: 1.7 mmol/L (ref 0.5–1.9)

## 2020-11-15 LAB — APTT: aPTT: 35 seconds (ref 24–36)

## 2020-11-15 LAB — LACTATE DEHYDROGENASE: LDH: 130 U/L (ref 98–192)

## 2020-11-15 MED ORDER — HYDROCODONE BIT-HOMATROP MBR 5-1.5 MG/5ML PO SOLN
5.0000 mL | Freq: Once | ORAL | Status: AC | PRN
  Administered 2020-11-16: 5 mL via ORAL
  Filled 2020-11-15: qty 5

## 2020-11-15 MED ORDER — MORPHINE SULFATE (PF) 2 MG/ML IV SOLN: 2.0000 mg | INTRAVENOUS | Status: DC | PRN

## 2020-11-15 MED ORDER — SODIUM CHLORIDE 0.9 % IV SOLN: 2.0000 g | INTRAVENOUS | Status: DC

## 2020-11-15 MED ORDER — SODIUM CHLORIDE 0.9 % IV SOLN
500.0000 mg | INTRAVENOUS | Status: DC
  Administered 2020-11-15: 500 mg via INTRAVENOUS
  Filled 2020-11-15: qty 500

## 2020-11-15 MED ORDER — LACTATED RINGERS IV BOLUS (SEPSIS)
1000.0000 mL | Freq: Once | INTRAVENOUS | Status: AC
  Administered 2020-11-15: 1000 mL via INTRAVENOUS

## 2020-11-15 MED ORDER — POTASSIUM CHLORIDE 10 MEQ/100ML IV SOLN
10.0000 meq | INTRAVENOUS | Status: AC
  Administered 2020-11-15 (×4): 10 meq via INTRAVENOUS
  Filled 2020-11-15 (×4): qty 100

## 2020-11-15 MED ORDER — ACETAMINOPHEN 325 MG PO TABS: 650.0000 mg | ORAL_TABLET | Freq: Four times a day (QID) | ORAL | Status: DC | PRN

## 2020-11-15 MED ORDER — ADULT MULTIVITAMIN W/MINERALS CH
1.0000 | ORAL_TABLET | Freq: Two times a day (BID) | ORAL | Status: DC
  Administered 2020-11-16: 1 via ORAL
  Filled 2020-11-15 (×6): qty 1

## 2020-11-15 MED ORDER — HEPARIN SODIUM (PORCINE) 5000 UNIT/ML IJ SOLN
5000.0000 [IU] | Freq: Three times a day (TID) | INTRAMUSCULAR | Status: DC
  Administered 2020-11-16: 5000 [IU] via SUBCUTANEOUS
  Filled 2020-11-15: qty 1

## 2020-11-15 MED ORDER — ONDANSETRON HCL 4 MG/2ML IJ SOLN: 4.0000 mg | Freq: Four times a day (QID) | INTRAMUSCULAR | Status: DC | PRN

## 2020-11-15 MED ORDER — POTASSIUM CHLORIDE 20 MEQ PO PACK
40.0000 meq | PACK | Freq: Once | ORAL | Status: AC
  Administered 2020-11-15: 40 meq via ORAL
  Filled 2020-11-15: qty 2

## 2020-11-15 MED ORDER — SODIUM CHLORIDE 0.9 % IV SOLN: INTRAVENOUS | Status: DC

## 2020-11-15 MED ORDER — SODIUM CHLORIDE 0.9 % IV SOLN: 500.0000 mg | INTRAVENOUS | Status: DC

## 2020-11-15 MED ORDER — LENALIDOMIDE 15 MG PO CAPS: 15.0000 mg | ORAL_CAPSULE | Freq: Every day | ORAL | 0 refills | Status: DC

## 2020-11-15 MED ORDER — ENSURE ENLIVE PO LIQD
237.0000 mL | Freq: Three times a day (TID) | ORAL | Status: DC
  Administered 2020-11-16: 237 mL via ORAL

## 2020-11-15 MED ORDER — LACTATED RINGERS IV BOLUS (SEPSIS)
250.0000 mL | Freq: Once | INTRAVENOUS | Status: AC
  Administered 2020-11-15: 250 mL via INTRAVENOUS

## 2020-11-15 MED ORDER — SODIUM CHLORIDE 0.9 % IV SOLN
2.0000 g | INTRAVENOUS | Status: DC
  Administered 2020-11-15: 2 g via INTRAVENOUS
  Filled 2020-11-15: qty 20

## 2020-11-15 MED ORDER — MAGNESIUM SULFATE 4 GM/100ML IV SOLN
4.0000 g | Freq: Once | INTRAVENOUS | Status: AC
  Administered 2020-11-15: 4 g via INTRAVENOUS
  Filled 2020-11-15: qty 100

## 2020-11-15 NOTE — Telephone Encounter (Signed)
Oral Oncology Patient Advocate Encounter  Received notification from [place] [facility] that patient has been successfully enrolled into their program to receive Revlimid from the manufacturer at $0 out of pocket until 11/14/21.    I will call the patients brother and let him know of approval and that he will have to re-apply next year.  Specialty Pharmacy that will dispense medication is [facility] by [employer].  Patient knows to call the office with questions or concerns.   Oral Oncology Clinic will continue to follow.  [place] [facility] [place] Phone [phone] Fax [phone] 11/15/2020 8:28 AM

## 2020-11-15 NOTE — Progress Notes (Addendum)
This nurse received a critical potassium level of 2.7.  MD has been made aware.  Given orders to call in [place] 20 meq daily.  Per MD patient was to take two tablets tonight. Patient is currently in ED being evaluated.  MD has been made aware.

## 2020-11-15 NOTE — Telephone Encounter (Signed)
Revlimid prescription sent to [facility] by [employer] due to drug coverage

## 2020-11-15 NOTE — H&P (Signed)
TRH H&P    Patient Demographics:    Paul Norton, is a 65 y.o. male  MRN: [id number]  DOB - 07-03-1956  Admit Date - 11/15/2020  Referring MD/NP/PA: Jillyn Ledger  Outpatient Primary MD for the patient is Tysinger, Camelia Eng, PA-C  Patient coming from: Home  Chief complaint- shortness of breath   HPI:    Paul Norton  is a 65 y.o. male, with history of medical noncompliance, hypertension, hyperlipidemia, presents the ED with a chief complaint of shortness of breath.  He reports that it started with a cough last night.  He coughed all through the night and was bringing up yellow sputum.  He reports that during the day today he became short of breath.  It came on gradually and has been worsening since.  He denies any fevers.  He denies any chest pain, palpitations.  He does admit to associated hoarseness from coughing.  He does report intercostal pain from coughing so much.  He reports he did take Tylenol last night, which offered no relief.  Patient is difficult historian.  He has stated several times that he does not plan to take any medications while he is here in the hospital.  He had been scheduled to see Dr. Raliegh Ip for multiple myeloma in the outpatient setting for tomorrow.  He is very upset about maybe missing that appointment.  Patient's only other complaint at this time is no appetite.  He reports he is having 1 normal meal in the last 3 months.  He reports that he had COVID 6 months ago and since then nothing is tasted good so he does not need anything.  He denies any stomach pain, nausea, vomiting.  Patient does not smoke, does not drink, does not use illicit drugs.  He is vaccinated for COVID.  He is DNR.  In the ED Temp 102.2, heart rate 117-138, respiratory rate 19-37, blood pressure 155/72, satting at 88% on high flow nasal cannula Almost leukopenic with a white blood cell count of 4.0, hemoglobin  8.1 Hyponatremia at 126, hypokalemia at 2.5, hypochloremia at 97, bicarb slightly decreased at 22, BUN elevated at 30, creatinine 1.67 Blood cultures pending Negative respiratory panel Lactic acid 1.6 Chest x-ray shows left base opacity concerning for pneumonia and widespread lytic bony lesions Patient was given 2.25 L bolus Started on Rocephin and Zithromax Admission Requested for Further Management of Sepsis Secondary to CAP    Review of systems:    In addition to the HPI above,  Fever No Headache, No changes with Vision or hearing, No problems swallowing food or Liquids, No Chest pain, admits to cough and shortness of breath No Abdominal pain, No Nausea or Vomiting, bowel movements are regular, No Blood in stool or Urine, No dysuria, No new skin rashes or bruises, No new joints pains-aches,  No new weakness, tingling, numbness in any extremity, No recent weight gain or loss, No polyuria, polydypsia or polyphagia, Mental stressor of relatively recent diagnosis of multiple myeloma per patient  All other systems reviewed and are negative.  Past History of the following :    Past Medical History:  Diagnosis Date  . Colonoscopy refused 09/2017  . Hyperlipidemia   . Hypertension   . Refuses treatment 09/2017   refuses screenings such as cancer screening, refuses vaccines, refuses normal preventative care  . Vaccine refused by patient    all vaccines as of 09/2017      Past Surgical History:  Procedure Laterality Date  . COLONOSCOPY     never, declines as of 09/2017      Social History:      Social History   Tobacco Use  . Smoking status: Never Smoker  . Smokeless tobacco: Never Used  Substance Use Topics  . Alcohol use: No       Family History :    History reviewed. No pertinent family history. Family history of hypertension   Home Medications:   Prior to Admission medications   Medication Sig Start Date End Date Taking? Authorizing Provider   acetaminophen (TYLENOL) 325 MG tablet Take 2 tablets (650 mg total) by mouth every 6 (six) hours as needed for mild pain (or Fever >/= 101). 11/09/20  Yes Shahmehdi, Seyed A, MD  calcium-vitamin D (OSCAL WITH D) 500-200 MG-UNIT tablet Take 1 tablet by mouth 2 (two) times daily. 11/09/20 12/09/20 Yes Shahmehdi, Valeria Batman, MD  feeding supplement (ENSURE ENLIVE / ENSURE PLUS) LIQD Take 237 mLs by mouth 3 (three) times daily between meals. 11/09/20  Yes Shahmehdi, Valeria Batman, MD  Multiple Vitamins-Minerals (EMERGEN-C IMMUNE PLUS) PACK Take 1 tablet by mouth 2 (two) times daily. 09/06/20  Yes Tysinger, Camelia Eng, PA-C  amLODipine (NORVASC) 10 MG tablet Take 1 tablet (10 mg total) by mouth daily. Patient not taking: No sig reported 03/31/20   Tysinger, Camelia Eng, PA-C  hydrocortisone (ANUSOL-HC) 25 MG suppository Place 1 suppository (25 mg total) rectally 2 (two) times daily. Patient not taking: No sig reported 11/09/20   Deatra James, MD  lenalidomide (REVLIMID) 15 MG capsule Take 1 capsule (15 mg total) by mouth daily. 14 days on, 7 days off Patient not taking: Reported on 11/15/2020 11/15/20   Derek Jack, MD  metoprolol tartrate (LOPRESSOR) 100 MG tablet Take 1 tablet (100 mg total) by mouth 2 (two) times daily. Patient not taking: No sig reported 03/31/20   Tysinger, Camelia Eng, PA-C     Allergies:    No Known Allergies   Physical Exam:   Vitals  Blood pressure 136/66, pulse (!) 114, temperature 99.2 F (37.3 C), temperature source Oral, resp. rate (!) 30, height $RemoveBe'5\' 11"'mPPrZKAro$  (1.803 m), weight 67 kg, SpO2 93 %.  1.  General: Patient lying supine in bed chronically ill-appearing, increased work of breathing  2. Psychiatric: Behavior is normal for situation, alert and oriented x3 Limited judgment and insight regarding healthcare, recent and remote memory intact on limited exam  3. Neurologic: Face is symmetric, speech and language are normal, moves all 4 extremities voluntarily, no focal deficit on  limited exam  4. HEENMT:  Head is atraumatic, normocephalic, pupils reactive to light, neck is cachectic, trachea is midline, mucous membranes are dry  5. Respiratory : Lungs are diminished in the bilateral bases, coarse breath sounds on the left lung field, no wheezes or rales, increased work of breathing, high flow nasal cannula in place  6. Cardiovascular : Heart rate is tachycardic, rhythm is regular, no murmurs rubs or gallops, no peripheral edema  7. Gastrointestinal:  Abdomen is soft, nondistended, nontender to palpation bowel sounds active  8. Skin:  Skin is warm dry and intact without acute lesion on limited exam  9.Musculoskeletal:  No acute deformity, no calf tenderness, no peripheral edema    Data Review:    CBC Recent Labs  Lab 11/09/20 0537 11/15/20 1253 11/15/20 1700  WBC 4.2 3.9* 4.0  HGB 8.1* 8.2* 8.1*  HCT 25.7* 24.8* 24.4*  PLT 118* 156 153  MCV 98.1 94.3 93.1  MCH 30.9 31.2 30.9  MCHC 31.5 33.1 33.2  RDW 18.8* 19.5* 19.5*  LYMPHSABS  --  0.3* 0.4*  MONOABS  --  0.4 0.3  EOSABS  --  0.0 0.0  BASOSABS  --  0.0 0.0   ------------------------------------------------------------------------------------------------------------------  Results for orders placed or performed during the hospital encounter of 11/15/20 (from the past 48 hour(s))  Culture, blood (x 2)     Status: None (Preliminary result)   Collection Time: 11/15/20  5:00 PM   Specimen: BLOOD LEFT FOREARM  Result Value Ref Range   Specimen Description BLOOD LEFT FOREARM    Special Requests      BOTTLES DRAWN AEROBIC AND ANAEROBIC Blood Culture adequate volume Performed at [facility], [address], [place], [place] [address]    Culture PENDING    Report Status PENDING   Culture, blood (x 2)     Status: None (Preliminary result)   Collection Time: 11/15/20  5:00 PM   Specimen: BLOOD  Result Value Ref Range   Specimen Description BLOOD LEFT ANTECUBITAL    Special Requests       BOTTLES DRAWN AEROBIC AND ANAEROBIC Blood Culture adequate volume Performed at [facility], [address], [place], [place] [address]    Culture PENDING    Report Status PENDING   CBC with Differential     Status: Abnormal   Collection Time: 11/15/20  5:00 PM  Result Value Ref Range   WBC 4.0 4.0 - 10.5 K/uL   RBC 2.62 (L) 4.22 - 5.81 MIL/uL   Hemoglobin 8.1 (L) 13.0 - 17.0 g/dL   HCT 24.4 (L) 39.0 - 52.0 %   MCV 93.1 80.0 - 100.0 fL   MCH 30.9 26.0 - 34.0 pg   MCHC 33.2 30.0 - 36.0 g/dL   RDW 19.5 (H) 11.5 - 15.5 %   Platelets 153 150 - 400 K/uL   nRBC 0.0 0.0 - 0.2 %   Neutrophils Relative % 81 %   Neutro Abs 3.3 1.7 - 7.7 K/uL   Lymphocytes Relative 9 %   Lymphs Abs 0.4 (L) 0.7 - 4.0 K/uL   Monocytes Relative 7 %   Monocytes Absolute 0.3 0.1 - 1.0 K/uL   Eosinophils Relative 0 %   Eosinophils Absolute 0.0 0.0 - 0.5 K/uL   Basophils Relative 1 %   Basophils Absolute 0.0 0.0 - 0.1 K/uL   WBC Morphology MILD LEFT SHIFT (1-5% METAS, OCC MYELO, OCC BANDS)    Immature Granulocytes 2 %   Abs Immature Granulocytes 0.08 (H) 0.00 - 0.07 K/uL   Reactive, Benign Lymphocytes PRESENT    Dohle Bodies PRESENT     Comment: Performed at [facility], [address] [place], [place] [address]  Comprehensive metabolic panel     Status: Abnormal   Collection Time: 11/15/20  5:00 PM  Result Value Ref Range   Sodium 126 (L) 135 - 145 mmol/L   Potassium 2.5 (LL) 3.5 - 5.1 mmol/L    Comment: CRITICAL RESULT CALLED TO, READ BACK BY AND VERIFIED WITH: OAKLEY,B ON 11/15/20 AT 1805 BY  LOY,C    Chloride 97 (L) 98 - 111 mmol/L   CO2 22 22 - 32 mmol/L   Glucose, Bld 119 (H) 70 - 99 mg/dL    Comment: Glucose reference range applies only to samples taken after fasting for at least 8 hours.   BUN 30 (H) 8 - 23 mg/dL   Creatinine, Ser 1.67 (H) 0.61 - 1.24 mg/dL   Calcium 6.8 (L) 8.9 - 10.3 mg/dL   Total Protein 10.0 (H) 6.5 - 8.1 g/dL   Albumin 2.0 (L) 3.5 - 5.0 g/dL   AST 36 15 - 41 U/L   ALT 34 0  - 44 U/L   Alkaline Phosphatase 58 38 - 126 U/L   Total Bilirubin 0.9 0.3 - 1.2 mg/dL   GFR, Estimated 46 (L) >60 mL/min    Comment: (NOTE) Calculated using the CKD-EPI Creatinine Equation (2021)    Anion gap 7 5 - 15    Comment: Performed at [facility], [address] [place], [place] [address]  Lactic acid, plasma     Status: None   Collection Time: 11/15/20  5:00 PM  Result Value Ref Range   Lactic Acid, Venous 1.6 0.5 - 1.9 mmol/L    Comment: Performed at [facility], [address], [place], [place] [address]  Resp Panel by RT-PCR (Flu A&B, Covid) Nasopharyngeal Swab     Status: None   Collection Time: 11/15/20  5:07 PM   Specimen: Nasopharyngeal Swab; Nasopharyngeal(NP) swabs in vial transport medium  Result Value Ref Range   SARS Coronavirus 2 by RT PCR NEGATIVE NEGATIVE    Comment: (NOTE) SARS-CoV-2 target nucleic acids are NOT DETECTED.  The SARS-CoV-2 RNA is generally detectable in upper respiratory specimens during the acute phase of infection. The lowest concentration of SARS-CoV-2 viral copies this assay can detect is 138 copies/mL. A negative result does not preclude SARS-Cov-2 infection and should not be used as the sole basis for treatment or other patient management decisions. A negative result may occur with  improper specimen collection/handling, submission of specimen other than nasopharyngeal swab, presence of viral mutation(s) within the areas targeted by this assay, and inadequate number of viral copies(<138 copies/mL). A negative result must be combined with clinical observations, patient history, and epidemiological information. The expected result is Negative.  Fact Sheet for Patients:  EntrepreneurPulse.com.au  Fact Sheet for Healthcare Providers:  IncredibleEmployment.be  This test is no t yet approved or cleared by the [place] FDA and  has been authorized for detection and/or diagnosis of SARS-CoV-2  by FDA under an Emergency Use Authorization (EUA). This EUA will remain  in effect (meaning this test can be used) for the duration of the COVID-19 declaration under Section 564(b)(1) of the Act, 21 U.S.C.section 360bbb-3(b)(1), unless the authorization is terminated  or revoked sooner.       Influenza A by PCR NEGATIVE NEGATIVE   Influenza B by PCR NEGATIVE NEGATIVE    Comment: (NOTE) The Xpert Xpress SARS-CoV-2/FLU/RSV plus assay is intended as an aid in the diagnosis of influenza from Nasopharyngeal swab specimens and should not be used as a sole basis for treatment. Nasal washings and aspirates are unacceptable for Xpert Xpress SARS-CoV-2/FLU/RSV testing.  Fact Sheet for Patients: EntrepreneurPulse.com.au  Fact Sheet for Healthcare Providers: IncredibleEmployment.be  This test is not yet approved or cleared by the [place] FDA and has been authorized for detection and/or diagnosis of SARS-CoV-2 by FDA under an Emergency Use Authorization (EUA). This EUA will remain in effect (meaning  this test can be used) for the duration of the COVID-19 declaration under Section 564(b)(1) of the Act, 21 U.S.C. section 360bbb-3(b)(1), unless the authorization is terminated or revoked.  Performed at [facility], [address] [place], [place] [address]   Lactic acid, plasma     Status: None   Collection Time: 11/15/20  7:43 PM  Result Value Ref Range   Lactic Acid, Venous 1.7 0.5 - 1.9 mmol/L    Comment: Performed at [facility], [address] [place], [place] [address]  Protime-INR     Status: Abnormal   Collection Time: 11/15/20  7:43 PM  Result Value Ref Range   Prothrombin Time 15.7 (H) 11.4 - 15.2 seconds   INR 1.3 (H) 0.8 - 1.2    Comment: (NOTE) INR goal varies based on device and disease states. Performed at [facility], [address], [place], [place] [address]   APTT     Status: None   Collection Time: 11/15/20  7:43 PM   Result Value Ref Range   aPTT 35 24 - 36 seconds    Comment: Performed at [facility], [address], [place], [place] [address]  Magnesium     Status: Abnormal   Collection Time: 11/15/20  7:43 PM  Result Value Ref Range   Magnesium 1.0 (L) 1.7 - 2.4 mg/dL    Comment: Performed at [facility], [address] [place], [place] [address]    Chemistries  Recent Labs  Lab 11/09/20 [phone] 11/15/20 1253 11/15/20 1700 11/15/20 1943  NA 133* 127* 126*  --   K 3.9 2.7* 2.5*  --   CL 105 97* 97*  --   CO2 $Re'23 22 22  'AFp$ --   GLUCOSE 108* 113* 119*  --   BUN 19 29* 30*  --   CREATININE 1.07 1.67* 1.67*  --   CALCIUM 6.1* 7.1* 6.8*  --   MG  --  1.0*  --  1.0*  AST  --  36 36  --   ALT  --  36 34  --   ALKPHOS  --  60 58  --   BILITOT  --  1.0 0.9  --    ------------------------------------------------------------------------------------------------------------------  ------------------------------------------------------------------------------------------------------------------ GFR: Estimated Creatinine Clearance: 42.9 mL/min (A) (by C-G formula based on SCr of 1.67 mg/dL (H)). Liver Function Tests: Recent Labs  Lab 11/09/20 0537 11/15/20 1253 11/15/20 1700  AST  --  36 36  ALT  --  36 34  ALKPHOS  --  60 58  BILITOT  --  1.0 0.9  PROT  --  9.9* 10.0*  ALBUMIN 1.9* 2.0* 2.0*   No results for input(s): LIPASE, AMYLASE in the last 168 hours. No results for input(s): AMMONIA in the last 168 hours. Coagulation Profile: Recent Labs  Lab 11/15/20 1943  INR 1.3*   Cardiac Enzymes: No results for input(s): CKTOTAL, CKMB, CKMBINDEX, TROPONINI in the last 168 hours. BNP (last 3 results) No results for input(s): PROBNP in the last 8760 hours. HbA1C: No results for input(s): HGBA1C in the last 72 hours. CBG: No results for input(s): GLUCAP in the last 168 hours. Lipid Profile: No results for input(s): CHOL, HDL, LDLCALC, TRIG, CHOLHDL, LDLDIRECT in the last 72  hours. Thyroid Function Tests: No results for input(s): TSH, T4TOTAL, FREET4, T3FREE, THYROIDAB in the last 72 hours. Anemia Panel: No results for input(s): VITAMINB12, FOLATE, FERRITIN, TIBC, IRON, RETICCTPCT in the last 72 hours.  --------------------------------------------------------------------------------------------------------------- Urine analysis:    Component Value Date/Time  COLORURINE YELLOW 10/29/2020 2356   APPEARANCEUR HAZY (A) 10/29/2020 2356   LABSPEC 1.014 10/29/2020 2356   PHURINE 7.0 10/29/2020 2356   GLUCOSEU NEGATIVE 10/29/2020 2356   HGBUR NEGATIVE 10/29/2020 2356   BILIRUBINUR NEGATIVE 10/29/2020 [place] 10/29/2020 2356   PROTEINUR 100 (A) 10/29/2020 2356   NITRITE NEGATIVE 10/29/2020 2356   LEUKOCYTESUR NEGATIVE 10/29/2020 2356      Imaging Results:    DG Chest Port 1 View  Result Date: 11/15/2020 CLINICAL DATA:  Shortness of breath with cough EXAM: PORTABLE CHEST 1 VIEW COMPARISON:  Chest radiograph October 31, 2020 and chest CT October 30, 2020 FINDINGS: Increased opacity in the left base is concerning for pneumonia. There is underlying interstitial thickening throughout the lungs. The heart size and pulmonary vascularity are normal. No adenopathy. Lytic bone lesions are seen throughout the chest. IMPRESSION: Left base opacity concerning for acute pneumonia. Underlying probable degree of bronchitis. Heart size normal. Widespread lytic bony lesions, an appearance that may well be indicative of multiple myeloma. Widespread metastatic disease could also present in this manner. Electronically Signed   By: Lowella Grip III M.D.   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 Acute respiratory failure with hypoxia 1. Patient satting in the low 80s, initially improved on 6 L nasal cannula, then desatted again and required 10 L high flow 2. Chest x-ray shows pneumonia of the left base 3. With patient's multiple myeloma, history of being a truck driver, tachycardia, hypoxia-PE is on the differential but less likely since.  He know there is pneumonia 4. D-dimer pending 5. In the setting of AKI if D-dimers positive VQ scan will be ordered for morning 6. Wean off O2 as tolerated 7. Monitor in ICU 2. Sepsis secondary to community-acquired pneumonia 1. Patient tachycardic, tachypneic, with respiratory failure and source of community-acquired pneumonia 2. 2.25 L bolus given in ED 3. Continue IV fluids 4. Rocephin and Zithromax started in the ED -continue 5. Lactic acid 1.6 6. Continue to monitor 3. Hypokalemia/hypomagnesemia/ hyponatremia/ 1. Secondary to poor p.o. intake 2. Replace and recheck 3. Monitor telemetry 4. AKI 1. Secondary to dehydration which is secondary to poor p.o. intake 2. Creatinine baseline 1.071-week ago 3. Creatinine today 1.67 4. Continue IV hydration 5. Avoid nephrotoxic agents when possible 6. Trend in a.m. 5. Severe protein calorie malnutrition 1. Albumin 2.0 2. Patient reports no real nutrition in 3 months 3. Start protein shakes 4. Consider nutrition consult 6. Multiple myeloma 1. Patient is very concerned about missing appointment with Dr. Delton Coombes 2. Inpatient consult to oncology 3. Patient also reporting he is ready to go to [facility] 4. Palliative care consult    DVT Prophylaxis-Heparin- SCDs   AM Labs Ordered, also please review Full Orders  Family Communication: no family at bedside Code Status: DNR Admission status: Inpatient :The appropriate admission status for this patient is  INPATIENT. Inpatient status is judged to be reasonable and necessary in order to provide the required intensity of service to ensure the patient's safety. The patient's presenting symptoms, physical exam findings,  and initial radiographic and laboratory data in the context of their chronic comorbidities is felt to place them at high risk for further clinical deterioration. Furthermore, it is not anticipated that the patient will be medically stable for discharge from the hospital within 2 midnights of admission. The following factors support the admission status of inpatient.     The patient's presenting symptoms include shortness of breath The worrisome physical exam findings include hypoxia The initial radiographic and laboratory data are worrisome because of pneumonia  the chronic co-morbidities include multiple myeloma     * I certify that at the point of admission it is my clinical judgment that the patient will require inpatient hospital care spanning beyond 2 midnights from the point of admission due to high intensity of service, high risk for further deterioration and high frequency of surveillance required.*  Time spent in minutes : Corinne

## 2020-11-15 NOTE — Progress Notes (Signed)
Elink following for code sepsis 

## 2020-11-15 NOTE — Telephone Encounter (Signed)
I called and spoke to Tammy, sister in law.  As of 5:45pm they were at the emergency department waiting to be seen.  He had xray showing pneumonia which would explain SOB, low oxygen.   He will likely be admitted to hospital tonight.

## 2020-11-15 NOTE — Patient Instructions (Signed)
[place] are diagnosed with multiple myeloma.  You will be treated in the clinic with a combination of chemotherapy and immunotherapy drugs.  Those drugs are daratumumab (Darzalex) and bortezomib (Velcade).  You will also have a chemotherapy pill, Revlimid, that you will take at home.  The intent of treatment is to control this disease, keep it from spreading further, and to alleviate any symptoms you may be having related to this disease.  You will see the doctor regularly throughout treatment.  We will obtain blood work from you prior to every treatment and monitor your results to make sure it is safe to give your treatment. The doctor monitors your response to treatment by the way you are feeling, your blood work, and by obtaining scans periodically.  There will be wait times while you are here for treatment.  It will take about 30 minutes to 1 hour for your lab work to result.  Then there will be wait times while pharmacy mixes your medications.    Daratumumab (Darzalex Faspro)  About This Drug Daratumumab is used to treat cancer. It is given under the skin in your abdomen (subcutaneously).  Possible Side Effects  . You may have a reaction to the drug. Sometimes you may be given medication to stop or lessen these side effects. Your nurse will check you closely for these signs: fever or shaking chills, flushing, facial swelling, feeling dizzy, headache, trouble breathing, rash, itching, chest tightness, or chest pain. These reactions may happen after your infusion. If this happens, call 911 for emergency care.  . Decrease in the number of white blood cells and platelets. This may raise your risk of infection, and raise your risk of bleeding.  . Fever and chills  . Tiredness  . Feeling dizzy  . Trouble sleeping  . Cough and trouble breathing  . Upper respiratory infection  . Nausea and throwing up (vomiting)  . Loose bowel movements  (diarrhea)  . Constipation (not able to move bowels)  . Muscle spasms  . Pain in the joints  . Back pain  . Swelling of your legs, ankles and/or feet  . Effects on the nerves are called peripheral neuropathy. You may feel numbness, tingling, or pain in your hands and feet. It may be hard for you to button your clothes, open jars, or walk as usual. The effect on the nerves may get worse with more doses of the drug. These effects get better in some people after the drug is stopped but it does not get better in all people.  Note: Each of the side effects above was reported in 20% or greater of patients treated with daratumumab. Not all possible side effects are included above.  Warnings and Precautions  . Severe decrease in the number of white blood cells and platelets    Severe allergic reaction  . This medication can affect the results of blood tests that match your blood type. Your blood type will be tested before treatment. Be sure to tell all healthcare providers you are taking this medicine before receiving blood transfusions, even for 6 months after your last dose.  Important Information  . This drug may be present in the saliva, tears, sweat, urine, stool, vomit, semen, and vaginal secretions. Talk to your doctor and/or your nurse about the necessary precautions to take during this time.  Treating Side Effects  . Drink plenty of fluids (a minimum of eight glasses per day is recommended).  Marland Kitchen  If you throw up or have loose bowel movements, you should drink more fluids so that you do not become dehydrated (lack of water in the body from losing too much fluid).  . To help with nausea and vomiting, eat small, frequent meals instead of three large meals a day. Choose foods and drinks that are at room temperature. Ask your nurse or doctor about other helpful tips and medicine that is available to help stop or lessen these symptoms.  . If you have diarrhea, eat low-fiber foods that  are high in protein and calories and avoid foods that can irritate your digestive tracts or lead to cramping.  . Ask your nurse or doctor about medicine that can lessen or stop your diarrhea or constipation.  . If you are not able to move your bowels, check with your doctor or nurse before you use enemas, laxatives, or suppositories.  . Manage tiredness by pacing your activities for the day.  . Be sure to include periods of rest between energy-draining activities.  . To decrease the risk of infection, wash your hands regularly.  . Avoid close contact with people who have a cold, the flu, or other infections.  . Take your temperature as your doctor or nurse tells you, and whenever you feel like you may have a fever.  . To help decrease the risk of bleeding, use a soft toothbrush. Check with your nurse before using dental floss.  . Be very careful when using knives or tools.  . Use an electric shaver instead of a razor.  Marland Kitchen Keeping your pain under control is important to your well-being. Please tell your doctor or nurse if you are experiencing pain.  . If you are dizzy, get up slowly after sitting or lying.  . If you are having trouble sleeping, talk to your nurse or doctor on tips to help you sleep better.  . If you have numbness and tingling in your hands and feet, be careful when cooking, walking, and handling sharp objects and hot liquids.  . Infusion reactions may rarely occur after your infusion. If this happens, call 911 for emergency care.  Food and Drug Interactions  . There are no known interactions of daratumumab with food.  . This drug may interact with other medicines. Tell your doctor and pharmacist about all the prescription and over-the-counter medicines and dietary supplements (vitamins, minerals, herbs and others) that you are taking at this time. Also, check with your doctor or pharmacist before starting any new prescription or over-the-counter medicines, or  dietary supplements to make sure that there are no interactions.  When to Call the Doctor  Call your doctor or nurse if you have any of these symptoms and/or any new or unusual symptoms:  . Fever of 100.4 F (38 C) or higher  . Chills  . Pain in your chest  . Coughing up yellow, green, or bloody mucus.  . Wheezing or trouble breathing  . Tiredness that interferes with your daily activities  . Trouble falling or staying asleep  . Feeling dizzy or lightheaded  . Easy bleeding or bruising  . Nausea that stops you from eating or drinking and/or is not relieved by prescribed medicines  . Vomiting  . No bowel movement in 3 days or when you feel uncomfortable.  . Loose bowel movements (diarrhea) 4 times a day or loose bowel movements with lack of strength or a feeling of being dizzy  . Weight gain of 5 pounds in one week (  fluid retention)  . Swelling of your legs, ankles and/or feet  . Pain that does not go away, or is not relieved by prescribed medicines  . Signs of infusion reaction: fever or shaking chills, flushing, facial swelling, feeling dizzy, headache, trouble breathing, rash, itching, chest tightness, or chest pain. If this happens, call 911 for emergency care.  . Numbness, tingling, or pain in your hands and feet  . If you think you may be pregnant or may have impregnated your partner  Reproduction Warnings  . Pregnancy warning: This drug may have harmful effects on the unborn baby. Women of childbearing potential should use effective methods of birth control during your cancer treatment and for at least 3 months after treatment. Let your doctor know right away if you think you may be pregnant.  . Breastfeeding warning: It is not known if this drug passes into breast milk. For this reason, women should talk to their doctor about the risks and benefits of breastfeeding during treatment with this drug because this drug may enter the breast milk and cause harm to a  breastfeeding baby.  . Fertility warning: Human fertility studies have not been done with this drug. Talk with your doctor or nurse if you plan to have children. Ask for information on sperm or egg banking.   Bortezomib (Velcade)  About This Drug  Bortezomib is used to treat cancer. It is given in the vein (IV) or by a shot under the skin (subcutaneously).  You will receive this injection under your skin.  Possible Side Effects  . Bone marrow suppression. Decrease in the number of white blood cells, red blood cells, and platelets. This may raise your risk of infection, make you tired and weak (fatigue), and raise your risk of bleeding.  . Nausea and vomiting (throwing up)  . Constipation (not able to move bowels)  . Diarrhea (loose bowel movements)  . Fever  . Tiredness  . Decreased appetite (decreased hunger)  . Effects on the nerves are called peripheral neuropathy. You may feel numbness, tingling, or pain in your hands and feet. It may be hard for you to button your clothes, open jars, or walk as usual. The effect on the nerves may get worse with more doses of the drug. These effects get better in some people after the drug is stopped but it does not get better in all people.  . Rash  Note: Each of the side effects above was reported in 20% or greater of patients treated with bortezomib. Not all possible side effects are included above.  Warnings and Precautions  . Severe peripheral neuropathy  . Low blood pressure  . Congestive heart failure - your heart has less ability to pump blood properly.  . Trouble breathing because of fluid build-up and/or inflammation in your lungs  . Nausea, vomiting, diarrhea and constipation which sometimes requires treatment to help lessen these side effects. There is also an increased risk of developing a partial or complete blockage of your small and/or large intestine.  . Changes in your central nervous system can happen. The central  nervous system is made up of your brain and spinal cord. You could feel extreme tiredness, agitation, confusion, have hallucinations (see or hear things that are not there), trouble understanding or speaking, loss of control of your bowels or bladder, eyesight changes, numbness or lack of strength to your arms, legs, face, or body, seizures or coma. If you start to have any of these symptoms let your doctor  know right away.  . Tumor lysis syndrome: This drug may act on the cancer cells very quickly. This may affect how your kidneys work.  . Changes in your liver function Increased risk of a syndrome that affects your red blood cells, platelets and blood vessels in your kidneys, which can cause kidney failure and be life-threatening.  Important Information  . This drug may be present in the saliva, tears, sweat, urine, stool, vomit, semen, and vaginal secretions. Talk to your doctor and/or your nurse about the necessary precautions to take during this time.  . This drug may impair your ability to drive or use machinery. Use caution and tell your nurse or doctor if you feel dizzy, very sleepy, and/or experience low blood pressure.  Treating Side Effects  . Manage tiredness by pacing your activities for the day.  . Be sure to include periods of rest between energy-draining activities.  . To decrease the risk of infection, wash your hands regularly.  . Avoid close contact with people who have a cold, the flu, or other infections.  . Take your temperature as your doctor or nurse tells you, and whenever you feel like you may have a fever.  . To help decrease the risk of bleeding, use a soft toothbrush. Check with your nurse before using dental floss.  . Be very careful when using knives or tools.  . Use an electric shaver instead of a razor.  . Ask your doctor or nurse about medicines that are available to help stop or lessen constipation.  . If you are not able to move your bowels,  check with your doctor or nurse before you use enemas, laxatives, or suppositories.  . Drink plenty of fluids (a minimum of eight glasses per day is recommended).  . If you throw up or have loose bowel movements, you should drink more fluids so that you do not become dehydrated (lack of water in the body from losing too much fluid).  . If you have diarrhea, eat low-fiber foods that are high in protein and calories and avoid foods that can irritate your digestive tracts or lead to cramping.  . Ask your nurse or doctor about medicine that can lessen or stop your diarrhea.  . To help with nausea and vomiting, eat small, frequent meals instead of three large meals a day. Choose foods and drinks that are at room temperature. Ask your nurse or doctor about other helpful tips and medicine that is available to help stop or lessen these symptoms.  . To help with decreased appetite, eat foods high in calories and protein, such as meat, poultry, fish, dry beans, tofu, eggs, nuts, milk, yogurt, cheese, ice cream, pudding, and nutritional supplements.  . Consider using sauces and spices to increase taste. Daily exercise, with your doctor's approval, may increase your appetite.  . If you have numbness and tingling in your hands and feet, be careful when cooking, walking, and handling sharp objects and hot liquids.  . If you get a rash do not put anything on it unless your doctor or nurse says you may. Keep the area around the rash clean and dry. Ask your doctor for medicine if your rash bothers you.  Food and Drug Interactions  . This drug may interact with grapefruit and grapefruit juice. Talk to your doctor as this could make side effects worse.  . Check with your doctor or pharmacist about all other prescription medicines and over-the-counter medicines and dietary supplements (vitamins, minerals, herbs and  others) you are taking before starting this medicine as there are known drug interactions with  bortezomib. Also, check with your doctor or pharmacist before starting any new prescription or over-the-counter medicines, or dietary supplements to make sure that there are no interactions.  . Avoid the use of St. John's Wort with bortezomib as this may lower the levels of the drug in your body, which can make it less effective.  When to Call the Doctor  Call your doctor or nurse if you have any of these symptoms and/or any new or unusual symptoms:  . Fever of 100.4 F (38 C) or higher  . Chills  . Tiredness that interferes with your daily activities  . Feeling dizzy or lightheaded  . Feeling that your heart is beating in a fast or not normal way (palpitations)  . Cough  . Wheezing or trouble breathing  . Easy bleeding or bruising  . Confusion and/or agitation  . Hallucinations  . Trouble understanding or speaking  . Blurry vision or changes in your eyesight  . Numbness or lack of strength to your arms, legs, face, or body  . Symptoms of a seizure such as confusion, blacking out, passing out, loss of hearing or vision, blurred vision, unusual smells or tastes (such as burning rubber), trouble talking, tremors or shaking in parts or all of the body, repeated body movements, tense muscles that do not relax, and loss of control of urine and bowels. If you or your family member suspects you are having a seizure, call 911 right away.  . Nausea that stops you from eating or drinking and/or is not relieved by prescribed medicines  . Throwing up   . Lasting loss of appetite or rapid weight loss of five pounds in a week  . No bowel movement in 3 days or when you feel uncomfortable.  . Abdominal pain that does not go away  . Diarrhea, 4 times in one day or diarrhea with lack of strength or a feeling of being dizzy  . Numbness, tingling, or pain your hands and feet  . Swelling of legs, ankles, and/or feet  . Weight gain of 5 pounds in one week (fluid retention)  .  Decreased urine, or very dark urine  . New rash and/or itching  . Rash that is not relieved by prescribed medicines  . Signs of tumor lysis: Confusion or agitation, decreased urine, nausea/vomiting, diarrhea, muscle cramping, numbness and/or tingling, seizures.  . Signs of possible liver problems: dark urine, pale bowel movements, bad stomach pain, feeling very tired and weak, unusual itching, or yellowing of the eyes or skin  . If you think you are pregnant or may have impregnated your partner  Reproduction Warnings  . Pregnancy warning: This drug can have harmful effects on the unborn baby. Women of child bearing potential should use effective methods of birth control during your cancer treatment and for at least 7 months after treatment. Men with male partners of childbearing potential should use effective methods of birth control during your cancer treatment and for at least 4 months after your cancer treatment. Let your doctor know right away if you think you may be pregnant or may have impregnated your partner.  . Breastfeeding warning: Women should not breastfeed during treatment and for 2 months month after treatment because this drug could enter the breast milk and cause harm to a breastfeeding baby.  . Fertility warning: In men and women both, this drug may affect your ability to have  children in the future. Talk with your doctor or nurse if you plan to have children. Ask for information on sperm or egg banking.   Lenalidomide (Revlimid)  About This Drug Lenalidomide is used to treat cancer. It is given orally (by mouth).  Possible Side Effects . Bone marrow suppression. This is a decrease in the number of white blood cells, red blood cells, and platelets. This may raise your risk of infection, make you tired and weak (fatigue), and raise your risk of bleeding.  . Nausea  . Diarrhea (loose bowel movements)  . Constipation (unable to move bowels)  . Inflammation of your  stomach and/or intestines  . Pain in your abdomen or back pain  . Fever  . Tiredness and weakness  . Swelling of your legs, ankles and/or feet  . Decreased appetite (decreased hunger)  . Muscle cramps/spasms  . Pain in your joints  . Headache  . Feeling dizzy  . Tremor  . Trouble sleeping  . Nosebleed  . Upper respiratory infection, bronchitis  . Inflammation of the nasal passages and throat  . Trouble breathing  . Cough  . Rash and itching  Note: Each of the side effects above was reported in 15% or greater of patients treated with lenalidomide. Not all possible side effects are included above.  Warnings and Precautions . Blood clots and events such as stroke and heart attack. A blood clot in your leg may cause your leg to swell, appear red and warm, and/or cause pain. A blood clot in your lungs may cause trouble breathing, pain when breathing, and/or chest pain.  . Severe bone marrow suppression  . Changes in your liver function, which may cause liver failure and be life-threatening.  . Tumor lysis syndrome: This drug may act on the cancer cells very quickly. This may affect how your kidneys work and can be life-threatening.  . Changes in your thyroid function  . Severe allergic skin reaction which may be life-threatening. You may develop blisters on your skin that are filled with fluid or a severe red rash all over your body that may be painful.  . This drug may raise your risk of getting a second cancer.  . You may develop a syndrome called tumor flare reaction. You may have painful lymph nodes, enlarged spleen, fever, and a rash.  . This drug may make it more difficult to collect your stem cells if a stem cell transplant is part of your treatment plan.  . There is a rare increased risk of death in patients with chronic lymphocytic leukemia and a risk of early death (dying sooner) in patient with mantle cell lymphoma.  . Allergic reactions, including  anaphylaxis are rare but may happen in some patients. Signs of allergic reaction to this drug may be swelling of the face, feeling like your tongue or throat are swelling, trouble breathing, rash, itching, fever, chills, feeling dizzy, and/or feeling that your heart is beating in a fast or not normal way. If this happens, do not take another dose of this drug. You should get urgent medical treatment.  Note: Some of the side effects above are very rare. If you have concerns and/or questions, please discuss them with your medical team.  Important Information . You will need to sign up for a special program called Revlimid REMS when you start taking this drug. Your nurse will help you get started.  . Two negative pregnancy tests are required in women of childbearing potential prior to starting  treatment. Routine pregnancy tests are required during treatment.  . Do not donate blood during your treatment and for 4 weeks after your treatment.  . Men should not donate sperm during your treatment and for 4 weeks after your treatment because this drug is present in semen and may badly harm a baby.   How to Take Your Medication . Swallow the medicine whole with water, with or without food. Do not chew, break, or open it.  . Take this medicine at about the same time each day  . Missed dose: If you miss a dose, take it as soon as you think about it ONLY if it has been less than 12 hours since you normally take the missed dose. If it has been more than 12 hours, skip the missed dose and contact your physician. Take your next dose at the regular time. Do not take 2 doses at the same time and do not double up on the next dose.  . If you vomit a dose, take your next dose at the regular time.  . Handling: Wash your hands after handling your medicine, your caretakers should not handle your medicine with bare hands and should wear latex gloves.  . If you get any of the content of a broken capsules on your  skin, you should wash the area of the skin well with soap and water right away. Call your doctor if you get a skin reaction.  . This drug may be present in the saliva, tears, sweat, urine, stool, vomit, semen, and vaginal secretions. Talk to your doctor and/or your nurse about the necessary precautions to take during this time.  . Storage: Store this medicine in the original container at room temperature.  . Disposal of unused medicine: Do not flush any expired and/or unused medicine down the toilet or drain unless you are specifically instructed to do so on the medication label. Some facilities have take-back programs and/or other options. If you do not have a take-back program in your area, then please discuss with your nurse or your doctor how to dispose of unused medicine.  Treating Side Effects . Manage tiredness by pacing your activities for the day.  . Be sure to include periods of rest between energy-draining activities.  . If you are dizzy, get up slowly after sitting or lying.  . To decrease the risk of infection, wash your hands regularly.  . Avoid close contact with people who have a cold, the flu, or other infections.  . Take your temperature as your doctor or nurse tells you, and whenever you feel like you may have a fever.  . To help decrease the risk of bleeding, use a soft toothbrush. Check with your nurse before using dental floss.  . Be very careful when using knives or tools.  . Use an electric shaver instead of a razor.  . Ask your doctor or nurse about medicines that are available to help stop or lessen constipation and/or diarrhea.  . If you are not able to move your bowels, check with your doctor or nurse before you use enemas, laxatives, or suppositories.  . Drink plenty of fluids (a minimum of eight glasses per day is recommended).  . Drink fluids that contribute calories (whole milk, juice, soft drinks, sweetened beverages, milkshakes, and nutritional  supplements) instead of water.  . If you throw up or have loose bowel movements, you should drink more fluids so that you do not become dehydrated (lack of water in the  body from losing too much fluid).  . If you have diarrhea, eat low-fiber foods that are high in protein and calories and avoid foods that can irritate your digestive tracts or lead to cramping.  . To help with nausea and vomiting, eat small, frequent meals instead of three large meals a day.  Choose foods and drinks that are at room temperature. Ask your nurse or doctor about other helpful tips and medicine that is available to help stop or lessen these symptoms.  . To help with decreased appetite, eat small, frequent meals. Eat foods high in calories and protein, such as meat, poultry, fish, dry beans, tofu, eggs, nuts, milk, yogurt, cheese, ice cream, pudding, and nutritional supplements.  . Consider using sauces and spices to increase taste. Daily exercise, with your doctor's approval, may increase your appetite.  . If you get a rash, do not put anything on it unless your doctor or nurse says you may. Keep the area around the rash clean and dry. Ask your doctor for medicine if your rash bothers you.  Marland Kitchen Keeping your pain under control is important to your well-being. Please tell your doctor or nurse if you are experiencing pain.  . If you are having trouble sleeping, talk to your nurse or doctor on tips to help you sleep better.  . If you have a nosebleed, sit with your head tipped slightly forward. Apply pressure by lightly pinching the bridge of your nose between your thumb and forefinger. Call your doctor if you feel dizzy or faint or if the bleeding does not stop after 10 to 15 minutes.  . Moisturize your skin several times a day.  . Avoid sun exposure and apply sunscreen routinely when outdoors.  Food and Drug Interactions  . There are no known interactions of lenalidomide with food.  . Check with your doctor or  pharmacist about all other prescription medicines and over-the-counter medicines and dietary supplements (vitamins, minerals, herbs, and others) you are taking before starting this medicine as there are known drug interactions with lenalidomide. Also, check with your doctor or pharmacist before starting any new prescription or over-the-counter medicines, or dietary supplements to make sure that there are no interactions.  . There are known interactions of lenalidomide with blood-thinning medicine such as warfarin. Ask your doctor what precautions you should take.  When to Call the Doctor Call your doctor or nurse if you have any of these symptoms and/or any new or unusual symptoms: . Fever of 100.4 F (38 C) or higher  . Chills  . Tiredness that interferes with your daily activities  . Feeling dizzy or lightheaded  . Easy bleeding or bruising  . Your leg or arm is swollen, red, warm, and/or painful  . Headache that does not go away  . Nosebleed that does not stop bleeding after 10-15 minutes  . Painful lymph nodes  . Wheezing and/or trouble breathing  . Chest pain or symptoms of a heart attack. Most heart attacks involve pain in the center of the chest that lasts more than a few minutes. The pain may go away and come back. It can feel like pressure, squeezing, fullness, or pain. Sometimes pain is felt in one or both arms, the back, neck, jaw, or stomach. If any of these symptoms last 2 minutes, call 911.  Marland Kitchen Symptoms of a stroke such as sudden numbness or weakness of your face, arm, or leg, mostly on one side of your body; sudden confusion, trouble speaking or understanding;  sudden trouble seeing in one or both eyes; sudden trouble walking, feeling dizzy, loss of balance or coordination; or sudden, bad headache with no known cause. If you have any of these symptoms for 2 minutes, call 911.  . Signs of allergic reaction: swelling of the face, feeling like your tongue or throat are  swelling, trouble breathing, rash, itching, fever, chills, feeling dizzy, and/or feeling that your heart is beating in a fast or not normal way. If this happens, call 911 for emergency care.  . Coughing up yellow, green, or bloody mucus  . Feeling that your heart is beating in a fast or not normal way (palpitations)  . Nausea that stops you from eating or drinking and/or is not relieved by prescribed medicines  . Throwing up more than 3 times a day  . Loose bowel movements (diarrhea) 4 times a day or loose bowel movements with lack of strength or a feeling of being dizzy  . No bowel movement in 3 days or when you feel uncomfortable  . Trouble falling or staying asleep  . Pain in your abdomen that does not go away  . Weight gain of 5 pounds in one week (fluid retention)  . Swelling of your legs, ankles and/or feet  . Unexplained weight gain  . Lasting loss of appetite or rapid weight loss of five pounds in a week  . Pain that does not go away, or is not relieved by prescribed medicines  . Flu-like symptoms: fever, headache, muscle and joint aches, and fatigue (low energy, feeling weak)  . A new rash or itching that is not relieved by prescribed medicines  . Signs of possible liver problems: dark urine, pale bowel movements, bad stomach pain, feeling very tired and weak, unusual itching, or yellowing of the eyes or skin  . Signs of tumor lysis: confusion or agitation, decreased urine, nausea/vomiting, diarrhea, muscle cramping, numbness and/or tingling, seizures  . If you think you may be pregnant or may have impregnated your partner  Reproduction Warnings . Pregnancy warning: This drug can have harmful effects on the unborn baby. Women of childbearing potential must commit to abstain from heterosexual intercourse or use 2 effective methods of birth control, one of which, must be a highly effective method of birth control, beginning at least 4 weeks before treatment starts,  during your cancer treatment, including dose interruptions, and for at least 4 weeks after treatment. A highly effective method of birth control includes tubal ligation, intrauterine device (IUD), hormonal (birth control pills, injections, patch and/or implants) or a partner's vasectomy. Stop taking lenalidomide immediately and let your doctor know right away if you think you may be pregnant, miss your menstrual period, or experience unusual menstrual bleeding.  . Men with male partners of childbearing potential should use effective methods of birth control during your cancer treatment and for at least 4 weeks after your cancer treatment.   . Breastfeeding warning: Women should not breastfeed during treatment because this drug could enter the breast milk and cause harm to a breastfeeding baby.  . Fertility warning: Human fertility studies have not been done with this drug. Talk with your doctor or nurse if you plan to have children. Ask for information on sperm or egg banking.   Dexamethasone (Decadron)  About This Drug  Dexamethasone is used to treat cancer, to decrease inflammation and sometimes used before and after chemotherapy to prevent or treat nausea and/or vomiting. It is given in the vein (IV) or orally (by mouth).  Possible Side Effects  . Headache  . High blood pressure  . Abnormal heart beat  . Tiredness and weakness  . Changes in mood, which may include depression or a feeling of extreme well-being  . Trouble sleeping  . Increased sweating  . Increased appetite (increased hunger)  . Weight gain  . Increase risk of infections  . Pain in your abdomen  . Nausea  . Skin changes such as rash, dryness, redness  . Blood sugar levels may change  . Electrolyte changes  . Swelling of your legs, ankles and/or feet  . Changes in your liver function  . You may be at risk for cataracts, glaucoma or infections of the eye  . Muscle loss and / or weakness (lack of  muscle strength)  . Increased risk of developing osteoporosis- your bones may become weak and brittle  Note: Not all possible side effects are included above.  Warnings and Precautions  . This drug may cause you to feel irritable, nervous or restless.  . Allergic reactions, including anaphylaxis are rare but may happen in some patients. Signs of allergic reaction to this drug may be swelling of the face, feeling like your tongue or throat are swelling, trouble breathing, rash, itching, fever, chills, feeling dizzy, and/or feeling that your heart is beating in a fast or not normal way. If this happens, do not take another dose of this drug. You should get urgent medical treatment.  . High blood pressure and changes in electrolytes, which can cause fluid build-up around your heart, lungs or elsewhere.  . Increased risk of developing a hole in your stomach, small, and/or large intestine if you have ulcers in the lining of your stomach and/or intestine, or have diverticulitis, ulcerative colitis and/or other diseases that affect the gastrointestinal tract.  . Effects on the endocrine glands including the pituitary, adrenals or thyroid during or after use of this medication.  . Changes in the tissue of the heart, that can cause your heart to have less ability to pump blood. You may be short of breath or our arms, hands, legs and feet may swell.  . Increased risk of heart attack.  . Severe depression and other psychiatric disorders such as mood changes.  . Burning, pain and itching around your anus may happen when this drug is given in the vein too rapidly (IV). It usually happens suddenly and resolves in less than 1 minute.  Important Information  . Talk to your doctor or your nurse before stopping this medication, it should be stopped gradually. Depending on the dose and length of treatment, you could experience serious side effects if stopped abruptly (suddenly).  . Talk to your doctor  before receiving any vaccinations during your treatment. Some vaccinations are not recommended while receiving dexamethasone.  How to Take Your Medication  . For Oral (by mouth): You can take the medicine with or without food. If you have nausea or upset stomach, take it with food.  . Missed dose: If you miss a dose, do not take 2 doses at the same time or extra doses. . If you vomit a dose, take your next dose at the regular time. Do not take 2 doses at the same time  . Handling: Wash your hands after handling your medicine, your caretakers should not handle your medicine with bare hands and should wear latex gloves.  . Storage: Store this medicine in the original container at room temperature. Protect from moisture and light. Discuss with your nurse  or your doctor how to dispose of unused medicine.   Treating Side Effects  . Drink plenty of fluids (a minimum of eight glasses per day is recommended).  . To help with nausea and vomiting, eat small, frequent meals instead of three large meals a day. Choose foods and drinks that are at room temperature. Ask your nurse or doctor about other helpful tips and medicine that is available to help stop or lessen these symptoms.  . If you throw up, you should drink more fluids so that you do not become dehydrated (lack of water in the body from losing too much fluid).  . Manage tiredness by pacing your activities for the day.  . Be sure to include periods of rest between energy-draining activities.  . To help with muscle weakness, get regular exercise. If you feel too tired to exercise vigorously, try taking a short walk.  . If you are having trouble sleeping, talk to your nurse or doctor on tips to help you sleep better.  . If you are feeling depressed, talk to your nurse or doctor about it.  Marland Kitchen Keeping your pain under control is important to your well-being. Please tell your doctor or nurse if you are experiencing pain.  . If you have  diabetes, keep good control of your blood sugar level. Tell your nurse or your doctor if your glucose levels are higher or lower than normal.  . To decrease the risk of infection, wash your hands regularly.  . Avoid close contact with people who have a cold, the flu, or other infections.  . Take your temperature as your doctor or nurse tells you, and whenever you feel like you may have a fever.  . If you get a rash do not put anything on it unless your doctor or nurse says you may. Keep the area around the rash clean and dry. Ask your doctor for medicine if your rash bothers you.  . Moisturize your skin several times day.  . Avoid sun exposure and apply sunscreen routinely when outdoors.  Food and Drug Interactions  . There are no known interactions of dexamethasone with food.  . Check with your doctor or pharmacist about all other prescription medicines and over-the-counter medicines and dietary supplements (vitamins, minerals, herbs and others) you are taking before starting this medicine as there are known drug interactions with dexamethasone. Also, check with your doctor or pharmacist before starting any new prescription or over-the-counter medicines, or dietary supplement to make sure that there are no interactions.  . There are known interactions of dexamethasone with other medicines and products like acetaminophen, aspirin, and ibuprofen. Ask your doctor what over-the-counter (OTC) medicines you can take.  When to Call the Doctor  Call your doctor or nurse if you have any of these symptoms and/or any new or unusual symptoms:  . Fever of 100.4 F (38 C) or higher  . Chills  . A headache that does not go away  . Trouble breathing  . Blurry vision or other changes in eyesight  . Feel irritable, nervous or restless  . Trouble falling or staying asleep  . Severe mood changes such as depression or unusual thoughts and/or behaviors  . Thoughts of hurting yourself or  others, and suicide  . Tiredness that interferes with your daily activities  . Feeling that your heart is beating in a fast, slow or not normal way  . Feeling dizzy or lightheaded  . Chest pain or symptoms of a heart attack.  Most heart attacks involve pain in the center of the chest that lasts more than a few minutes. The pain may go away and come back, or it can be constant. It can feel like pressure, squeezing, fullness, or pain. Sometimes pain is felt in one or both arms, the back, neck, jaw, or stomach. If any of these symptoms last 2 minutes, call 911.  Marland Kitchen Heartburn or indigestion  . Nausea that stops you from eating or drinking and/or is not relieved by prescribed medicines  . Throwing up more than 3 times a day  . Pain in your abdomen that does not go away  . Abnormal blood sugar  . Unusual thirst, passing urine often, headache, sweating, shakiness, irritability  . Swelling of legs, ankles, or feet  . Weight gain of 5 pounds in one week (fluid retention)  . Signs of possible liver problems: dark urine, pale bowel movements, bad stomach pain, feeling very tired and weak, unusual itching, or yellowing of the eyes or skin  . Severe muscle weakness  . A new rash or a rash that is not relieved by prescribed medicines  . Signs of allergic reaction: swelling of the face, feeling like your tongue or throat are swelling, trouble breathing, rash, itching, fever, chills, feeling dizzy, and/or feeling that your heart is beating in a fast or not normal way. If this happens, call 911 for emergency care.  . If you think you may be pregnant  Reproduction Warnings  . Pregnancy warning: It is not known if this drug may harm an unborn child. For this reason, be sure to talk with your doctor if you are pregnant or planning to become pregnant while receiving this drug. Let your doctor know right away if you think you may be pregnant or may have impregnated your partner.  . Breastfeeding  warning: It is not known if this drug passes into breast milk. For this reason, women should talk to their doctor about the risks and benefits of breastfeeding during treatment with this drug because this drug may enter the breast milk and cause harm to a breastfeeding baby.  . Fertility warning: Human fertility studies have not been done with this drug. Talk with your doctor or nurse if you plan to have children. Ask for information on sperm banking.    SELF CARE ACTIVITIES WHILE ON CHEMOTHERAPY/IMMUNOTHERAPY:  Hydration Increase your fluid intake 48 hours prior to treatment and drink at least 8 to 12 cups (64 ounces) of water/decaffeinated beverages per day after treatment. You can still have your cup of coffee or soda but these beverages do not count as part of your 8 to 12 cups that you need to drink daily. No alcohol intake.  Medications Continue taking your normal prescription medication as prescribed.  If you start any new herbal or new supplements please let us know first to make sure it is safe.  Mouth Care Have teeth cleaned professionally before starting treatment. Keep dentures and partial plates clean. Use soft toothbrush and do not use mouthwashes that contain alcohol. Biotene is a good mouthwash that is available at most pharmacies or may be ordered by calling [phone]. Use warm salt water gargles (1 teaspoon salt per 1 quart warm water) before and after meals and at bedtime. Or you may rinse with 2 tablespoons of three-percent hydrogen peroxide mixed in eight ounces of water. If you are still having problems with your mouth or sores in your mouth please call the clinic. If you need  dental work, please let the doctor know before you go for your appointment so that we can coordinate the best possible time for you in regards to your chemo regimen. You need to also let your dentist know that you are actively taking chemo. We may need to do labs prior to your dental  appointment.  Skin Care Always use sunscreen that has not expired and with SPF (Sun Protection Factor) of 50 or higher. Wear hats to protect your head from the sun. Remember to use sunscreen on your hands, ears, face, & feet.  Use good moisturizing lotions such as udder cream, eucerin, or even Vaseline. Some chemotherapies can cause dry skin, color changes in your skin and nails.    . Avoid long, hot showers or baths. . Use gentle, fragrance-free soaps and laundry detergent. . Use moisturizers, preferably creams or ointments rather than lotions because the thicker consistency is better at preventing skin dehydration. Apply the cream or ointment within 15 minutes of showering. Reapply moisturizer at night, and moisturize your hands every time after you wash them.   Infection Prevention Please wash your hands for at least 30 seconds using warm soapy water. Handwashing is the #1 way to prevent the spread of germs. Stay away from sick people or people who are getting over a cold. If you develop respiratory systems such as green/yellow mucus production or productive cough or persistent cough let us know and we will see if you need an antibiotic. It is a good idea to keep a pair of gloves on when going into grocery stores/[employer] to decrease your risk of coming into contact with germs on the carts, etc. Carry alcohol hand gel with you at all times and use it frequently if out in public. If your temperature reaches 100.5 or higher please call the clinic and let us know.  If it is after hours or on the weekend please go to the ER if your temperature is over 100.4.  Please have your own personal thermometer at home to use.    Sex and bodily fluids If you are going to have sex, a condom must be used to protect the person that isn't taking immunotherapy. For a few days after treatment, immunotherapy can be excreted through your bodily fluids.  When using the toilet please close the lid and flush the toilet twice.   Do this for a few day after you have had immunotherapy.   Contraception It is not known for sure whether or not immunotherapy drugs can be passed on through semen or secretions from the vagina. Because of this some doctors advise people to use a barrier method if you have sex during treatment. This applies to vaginal, anal or oral sex.  Generally, doctors advise a barrier method only for the time you are actually having the treatment and for about a week after your treatment.  Advice like this can be worrying, but this does not mean that you have to avoid being intimate with your partner. You can still have close contact with your partner and continue to enjoy sex.  Animals If you have cats or birds we just ask that you not change the litter or change the cage.  Please have someone else do this for you while you are on immunotherapy.   Food Safety During and After Cancer Treatment Food safety is important for people both during and after cancer treatment. Cancer and cancer treatments, such as chemotherapy, radiation therapy, and stem cell/bone marrow transplantation, often weaken  the immune system. This makes it harder for your body to protect itself from foodborne illness, also called food poisoning. Foodborne illness is caused by eating food that contains harmful bacteria, parasites, or viruses.  Foods to avoid Some foods have a higher risk of becoming tainted with bacteria. These include: Marland Kitchen Unwashed fresh fruit and vegetables, especially leafy vegetables that can hide dirt and other contaminants . Raw sprouts, such as alfalfa sprouts . Raw or undercooked beef, especially ground beef, or other raw or undercooked meat and poultry . Fatty, fried, or spicy foods immediately before or after treatment.  These can sit heavy on your stomach and make you feel nauseous. . Raw or undercooked shellfish, such as oysters. . Sushi and sashimi, which often contain raw fish.  . Unpasteurized beverages,  such as unpasteurized fruit juices, raw milk, raw yogurt, or cider . Undercooked eggs, such as soft boiled, over easy, and poached; raw, unpasteurized eggs; or foods made with raw egg, such as homemade raw cookie dough and homemade mayonnaise  Simple steps for food safety  Shop smart. . Do not buy food stored or displayed in an unclean area. . Do not buy bruised or damaged fruits or vegetables. . Do not buy cans that have cracks, dents, or bulges. . Pick up foods that can spoil at the end of your shopping trip and store them in a cooler on the way home.  Prepare and clean up foods carefully. . Rinse all fresh fruits and vegetables under running water, and dry them with a clean towel or paper towel. . Clean the top of cans before opening them. . After preparing food, wash your hands for 20 seconds with hot water and soap. Pay special attention to areas between fingers and under nails. . Clean your utensils and dishes with hot water and soap. Marland Kitchen Disinfect your kitchen and cutting boards using 1 teaspoon of liquid, unscented bleach mixed into 1 quart of water.    Dispose of old food. . Eat canned and packaged food before its expiration date (the "use by" or "best before" date). . Consume refrigerated leftovers within 3 to 4 days. After that time, throw out the food. Even if the food does not smell or look spoiled, it still may be unsafe. Some bacteria, such as Listeria, can grow even on foods stored in the refrigerator if they are kept for too long.  Take precautions when eating out. . At restaurants, avoid buffets and salad bars where food sits out for a long time and comes in contact with many people. Food can become contaminated when someone with a virus, often a norovirus, or another "bug" handles it. . Put any leftover food in a "to-go" container yourself, rather than having the server do it. And, refrigerate leftovers as soon as you get home. . Choose restaurants that are clean and that  are willing to prepare your food as you order it cooked.    SYMPTOMS TO REPORT AS SOON AS POSSIBLE AFTER TREATMENT:   FEVER GREATER THAN 100.4 F  CHILLS WITH OR WITHOUT FEVER  NAUSEA AND VOMITING THAT IS NOT CONTROLLED WITH YOUR NAUSEA MEDICATION  UNUSUAL SHORTNESS OF BREATH  UNUSUAL BRUISING OR BLEEDING  TENDERNESS IN MOUTH AND THROAT WITH OR WITHOUT PRESENCE OF ULCERS  URINARY PROBLEMS  BOWEL PROBLEMS  UNUSUAL RASH     Wear comfortable clothing and clothing appropriate for easy access to any Portacath or PICC line. Let us know if there is anything that we can do  to make your therapy better!   What to do if you need assistance after hours or on the weekends: CALL [phone].  HOLD on the line, do not hang up.  You will hear multiple messages but at the end you will be connected with a nurse triage line.  They will contact the doctor if necessary.  Most of the time they will be able to assist you.  Do not call the hospital operator.    I have been informed and understand all of the instructions given to me and have received a copy. I have been instructed to call the clinic [phone] or my family physician as soon as possible for continued medical care, if indicated. I do not have any more questions at this time but understand that I may call the [facility] or the Patient Navigator at [phone] during office hours should I have questions or need assistance in obtaining follow-up care.

## 2020-11-15 NOTE — ED Provider Notes (Addendum)
[facility] EMERGENCY DEPARTMENT Provider Note   CSN: [id number] Arrival date & time: 11/15/20  1611     History Chief Complaint  Patient presents with  . Cough    Paul Norton is a 65 y.o. male.  The history is provided by the patient. No language interpreter was used.  Cough Sputum characteristics:  Nondescript Severity:  Severe Onset quality:  Gradual Duration:  2 days Timing:  Constant Progression:  Worsening Chronicity:  New Smoker: no   Relieved by:  Nothing Worsened by:  Nothing Ineffective treatments:  None tried Associated symptoms: fever   Associated symptoms: no chest pain    Pt complains of a cough, fever and shortness of breath.  Pt has a history of multiple myeloma and is seen here in the Oncology center    Past Medical History:  Diagnosis Date  . Colonoscopy refused 09/2017  . Hyperlipidemia   . Hypertension   . Refuses treatment 09/2017   refuses screenings such as cancer screening, refuses vaccines, refuses normal preventative care  . Vaccine refused by patient    all vaccines as of 09/2017    Patient Active Problem List   Diagnosis Date Noted  . History of COVID-19 11/15/2020  . History of exposure to respiratory irritant 11/15/2020  . Fatigue 11/15/2020  . Weight loss 11/15/2020  . Anemia 11/15/2020  . History of recent hospitalization 11/15/2020  . Multiple myeloma not having achieved remission ([place])   . Metastatic malignant neoplasm ([place])   . AKI (acute kidney injury) ([place]) 10/30/2020  . Symptomatic anemia 10/30/2020  . Acute metabolic encephalopathy 33/35/4562  . Hyponatremia 10/30/2020  . Moderate protein-calorie malnutrition ([place]) 10/30/2020  . Dehydration 10/30/2020  . Hypercalcemia 10/30/2020  . Abnormal CT of the head 10/30/2020  . Thrombocytopenia ([place]) 10/30/2020  . COVID-19 virus infection 09/06/2020  . Cough 09/06/2020  . DOE (dyspnea on exertion) 09/06/2020  . Abnormal SPEP 09/06/2020  . Grieving 09/06/2020   . Depressed mood 09/06/2020  . Encounter for health maintenance examination in adult 03/31/2020  . Screen for colon cancer 03/31/2020  . Screening for prostate cancer 03/31/2020  . Screening for diabetes mellitus 03/31/2020  . Hyperlipidemia 06/15/2019  . White coat syndrome with diagnosis of hypertension 06/15/2019  . Noncompliance 04/25/2018  . Essential hypertension, benign 10/10/2017  . Vaccine refused by patient 10/10/2017  . Refuses treatment 09/2017    Past Surgical History:  Procedure Laterality Date  . COLONOSCOPY     never, declines as of 09/2017       History reviewed. No pertinent family history.  Social History   Tobacco Use  . Smoking status: Never Smoker  . Smokeless tobacco: Never Used  Substance Use Topics  . Alcohol use: No  . Drug use: Never    Home Medications Prior to Admission medications   Medication Sig Start Date End Date Taking? Authorizing Provider  acetaminophen (TYLENOL) 325 MG tablet Take 2 tablets (650 mg total) by mouth every 6 (six) hours as needed for mild pain (or Fever >/= 101). 11/09/20   Shahmehdi, Seyed A, MD  amLODipine (NORVASC) 10 MG tablet Take 1 tablet (10 mg total) by mouth daily. Patient not taking: Reported on 11/14/2020 03/31/20   Tysinger, Camelia Eng, PA-C  calcium-vitamin D (OSCAL WITH D) 500-200 MG-UNIT tablet Take 1 tablet by mouth 2 (two) times daily. 11/09/20 12/09/20  ShahmehdiValeria Batman, MD  feeding supplement (ENSURE ENLIVE / ENSURE PLUS) LIQD Take 237 mLs by mouth 3 (three) times daily between meals.  11/09/20   Shahmehdi, Valeria Batman, MD  hydrocortisone (ANUSOL-HC) 25 MG suppository Place 1 suppository (25 mg total) rectally 2 (two) times daily. Patient not taking: Reported on 11/14/2020 11/09/20   Deatra James, MD  lenalidomide (REVLIMID) 15 MG capsule Take 1 capsule (15 mg total) by mouth daily. 14 days on, 7 days off 11/15/20   Derek Jack, MD  metoprolol tartrate (LOPRESSOR) 100 MG tablet Take 1 tablet (100 mg  total) by mouth 2 (two) times daily. Patient not taking: Reported on 11/14/2020 03/31/20   Tysinger, Camelia Eng, PA-C  Multiple Vitamins-Minerals (EMERGEN-C IMMUNE PLUS) PACK Take 1 tablet by mouth 2 (two) times daily. 09/06/20   Tysinger, Camelia Eng, PA-C    Allergies    Patient has no known allergies.  Review of Systems   Review of Systems  Constitutional: Positive for fever.  Respiratory: Positive for cough.   Cardiovascular: Negative for chest pain.  All other systems reviewed and are negative.   Physical Exam Updated Vital Signs BP (!) 162/64   Pulse (!) 128   Temp (!) 102.2 F (39 C) (Oral)   Resp (!) 23   Ht $R'5\' 11"'Op$  (1.803 m)   Wt 67 kg   SpO2 (!) 87%   BMI 20.60 kg/m   Physical Exam Vitals and nursing note reviewed.  Constitutional:      Appearance: He is well-developed.  HENT:     Head: Normocephalic and atraumatic.     Mouth/Throat:     Mouth: Mucous membranes are dry.  Eyes:     Conjunctiva/sclera: Conjunctivae normal.  Cardiovascular:     Rate and Rhythm: Regular rhythm. Tachycardia present.     Pulses: Normal pulses.     Heart sounds: No murmur heard.   Pulmonary:     Effort: Pulmonary effort is normal. No respiratory distress.     Breath sounds: Rhonchi present.  Abdominal:     Palpations: Abdomen is soft.     Tenderness: There is no abdominal tenderness.  Musculoskeletal:     Cervical back: Normal range of motion and neck supple.  Skin:    General: Skin is warm and dry.  Neurological:     General: No focal deficit present.     Mental Status: He is alert.  Psychiatric:        Mood and Affect: Mood normal.     ED Results / Procedures / Treatments   Labs (all labs ordered are listed, but only abnormal results are displayed) Labs Reviewed  CBC WITH DIFFERENTIAL/PLATELET - Abnormal; Notable for the following components:      Result Value   RBC 2.62 (*)    Hemoglobin 8.1 (*)    HCT 24.4 (*)    RDW 19.5 (*)    All other components within normal  limits  COMPREHENSIVE METABOLIC PANEL - Abnormal; Notable for the following components:   Sodium 126 (*)    Potassium 2.5 (*)    Chloride 97 (*)    Glucose, Bld 119 (*)    BUN 30 (*)    Creatinine, Ser 1.67 (*)    Calcium 6.8 (*)    Total Protein 10.0 (*)    Albumin 2.0 (*)    GFR, Estimated 46 (*)    All other components within normal limits  CULTURE, BLOOD (ROUTINE X 2)  CULTURE, BLOOD (ROUTINE X 2)  RESP PANEL BY RT-PCR (FLU A&B, COVID) ARPGX2  LACTIC ACID, PLASMA  LACTIC ACID, PLASMA  PROTIME-INR  APTT    EKG EKG  Interpretation  Date/Time:  Tuesday Nov 15 2020 16:28:26 EDT Ventricular Rate:  135 PR Interval:  112 QRS Duration: 86 QT Interval:  382 QTC Calculation: 573 R Axis:   61 Text Interpretation: Sinus tachycardia Non-specific ST-t changes Confirmed by Lajean Saver [phone]) on 11/15/2020 5:04:32 PM   Radiology DG Chest Port 1 View  Result Date: 11/15/2020 CLINICAL DATA:  Shortness of breath with cough EXAM: PORTABLE CHEST 1 VIEW COMPARISON:  Chest radiograph October 31, 2020 and chest CT October 30, 2020 FINDINGS: Increased opacity in the left base is concerning for pneumonia. There is underlying interstitial thickening throughout the lungs. The heart size and pulmonary vascularity are normal. No adenopathy. Lytic bone lesions are seen throughout the chest. IMPRESSION: Left base opacity concerning for acute pneumonia. Underlying probable degree of bronchitis. Heart size normal. Widespread lytic bony lesions, an appearance that may well be indicative of multiple myeloma. Widespread metastatic disease could also present in this manner. Electronically Signed   By: Lowella Grip III M.D.   On: 11/15/2020 17:22    Procedures .Critical Care Performed by: Fransico Meadow, PA-C Authorized by: Fransico Meadow, PA-C   Critical care provider statement:    Critical care time (minutes):  45   Critical care start time:  11/15/2020 3:00 PM   Critical care end time:  11/15/2020  6:43 PM   Critical care was time spent personally by me on the following activities:  Discussions with consultants, evaluation of patient's response to treatment, examination of patient, ordering and performing treatments and interventions, ordering and review of laboratory studies, ordering and review of radiographic studies, pulse oximetry, re-evaluation of patient's condition, obtaining history from patient or surrogate and review of old charts     Medications Ordered in ED Medications  cefTRIAXone (ROCEPHIN) 2 g in sodium chloride 0.9 % 100 mL IVPB (2 g Intravenous New Bag/Given 11/15/20 1733)  azithromycin (ZITHROMAX) 500 mg in sodium chloride 0.9 % 250 mL IVPB (500 mg Intravenous New Bag/Given 11/15/20 1728)  potassium chloride 10 mEq in 100 mL IVPB (has no administration in time range)  lactated ringers bolus 1,000 mL (1,000 mLs Intravenous New Bag/Given 11/15/20 1731)    And  lactated ringers bolus 1,000 mL (1,000 mLs Intravenous New Bag/Given 11/15/20 1736)    And  lactated ringers bolus 250 mL (250 mLs Intravenous New Bag/Given 11/15/20 1740)    ED Course  I have reviewed the triage vital signs and the nursing notes.  Pertinent labs & imaging results that were available during my care of the patient were reviewed by me and considered in my medical decision making (see chart for details).    MDM Rules/Calculators/A&P                          MDM:  Pt is febrile, tachycardic and 02 sats decreased.  Sepsis protocol orders started.  Pt given fluids and antibiotic.  Chest xray shows pneumonia, metastatic disease. Potassium is low at 2.6   Hospitalist consulted for admission  Final Clinical Impression(s) / ED Diagnoses Final diagnoses:  Sepsis ([place])  Community acquired pneumonia of left lung, unspecified part of lung  Metastatic malignant neoplasm, unspecified site [facility])  Hypokalemia    Rx / DC Orders ED Discharge Orders    None       Fransico Meadow, PA-C 11/15/20 1826     Fransico Meadow, PA-C 11/15/20 1844    Lajean Saver, MD 11/16/20 [phone]

## 2020-11-15 NOTE — ED Notes (Addendum)
Date and time results received: 11/15/20 6:07 PM  Test: Potassium Critical Value: 2.5  Name of Provider Notified: Threasa Alpha PA   Orders Received? Or Actions Taken? See orders

## 2020-11-15 NOTE — Progress Notes (Signed)
[place] Clinical Social Work  Initial Assessment   Jarrell Armond is a 65 y.o. year old male seen in exam room accompanied by his sister in law. Clinical Social Work was referred by treatment team for assessment of psychosocial needs.   SDOH (Social Determinants of Health) assessments performed: Yes SDOH Interventions   Flowsheet Row Most Recent Value  SDOH Interventions   Food Insecurity Interventions Intervention Not Indicated  Financial Strain Interventions Financial Counselor  Housing Interventions Intervention Not Indicated  Transportation Interventions Intervention Not Indicated      Distress Screen completed: No No flowsheet data found.    Family/Social Information:  . Housing Arrangement: patient lives with brother and sister in [place].  They moved patient in with them after he became ill, initially diagnosed with COVID and carbon monoxide exposure.   Now diagnosed with multple myeloma. . Family members/support persons in your life? Brother and sister in law are very supportive and involved with caring for patient, all needs met at this time.   . Transportation concerns: family will transport to/from appointments, no concerns mentioned . Employment: [employer] of work since approx Feb 2022. 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  May have short or long term disability available to him through his former employer; however, he is listed as having voluntarily separated from the company.  He cites lack of mental clarity due to carbon monoxide exposure, does not currently handle much of his own affairs as a result.  He has brother and sister in law who have various powers of attorney.  He was referred to [facility] for disability assistance to [employer] and declined a Medicaid  application as he has access to [facility] through his former workplace  Brother handling all affairs related to filing for disability and gathering information on benefits available to him from his former employer. .  Income source: none, has some savings . Financial concerns: Yes, due to illness and/or loss of work during treatment o Type of concern: Medical bills . Food access concerns: None . Religious or spiritual practice: Strong Christian faith background, feels this is a major source of support for him.  Listens to radio programs, reads his Bible.  Cites Biblical admonitions re no worrying and trust in God. . Medication Concerns: none at this time, [facility] will provide any resources available through [facility].  Estate manager/land agent will discuss IT trainer . Services Currently in place:  [facility] for disability assistance  Coping/ Adjustment to diagnosis: . Patient understands treatment plan and what happens next? Newly diagnosed with multiple myeloma, will start chemotherapy tomorrow.  His approach is to take it "one day at a time." States "I know that I am going to go home today and watch TV, I try not to think too much about the future."  However, he also discusses his wish to have his ashes buried between his mother and father.  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  He plans to use this same acceptance of the plan for the day when coping with the challenge of cancer treatment.   . Concerns about diagnosis and/or  treatment: How I will pay for the services I need and I'm not especially worried about anything . Patient reported stressors: Adjusting to my illness . Hopes and priorities: maintaining his balance as he copes with illness . Current coping skills/ strengths: Religious Affiliation and Supportive family/friends    SUMMARY: Current SDOH  Barriers:  . Financial constraints related to no insurance or income.  Family is fully supporting him at this time.  Clinical Social Work Clinical Goal(s):  . patient will work with SW to address concerns related to adjustment to illness  Interventions: . Discussed common feeling and emotions when being diagnosed with cancer, and the importance of support during treatment . Informed patient of the support team roles and support services at [facility] . Provided CSW contact information and encouraged patient to call with any questions or concerns . Referred patient to Cancer Care, Leukemia and Lymphoma Society, [place] Advocate   Follow Up Plan: Patient will contact CSW with any support or resource needs Patient verbalizes understanding of plan: Yes    [place] [facility], [facility]

## 2020-11-15 NOTE — Telephone Encounter (Signed)
What exactly needs to be ordered for patient?

## 2020-11-15 NOTE — ED Triage Notes (Signed)
Pt c/o cough last night. In triage pt oxygen 81% RA and HR 134. Pt states hes normally not on oxygen. Pt placed on 4L oxygen 87%. Pt placed on 6L

## 2020-11-15 NOTE — Telephone Encounter (Signed)
Pts sister in law Tammy called and said pt went to the cancer center today and has been worn out every since. He has been coughing and is short of breath and is asking for Oxygen

## 2020-11-16 ENCOUNTER — Encounter (HOSPITAL_COMMUNITY): Payer: Self-pay | Admitting: Family Medicine

## 2020-11-16 ENCOUNTER — Ambulatory Visit (HOSPITAL_COMMUNITY): Payer: Self-pay | Admitting: Hematology

## 2020-11-16 ENCOUNTER — Inpatient Hospital Stay (HOSPITAL_COMMUNITY): Payer: Commercial Managed Care - PPO

## 2020-11-16 ENCOUNTER — Ambulatory Visit (HOSPITAL_COMMUNITY): Payer: Self-pay

## 2020-11-16 DIAGNOSIS — Z515 Encounter for palliative care: Secondary | ICD-10-CM

## 2020-11-16 DIAGNOSIS — Z7189 Other specified counseling: Secondary | ICD-10-CM

## 2020-11-16 DIAGNOSIS — J9601 Acute respiratory failure with hypoxia: Secondary | ICD-10-CM

## 2020-11-16 DIAGNOSIS — C799 Secondary malignant neoplasm of unspecified site: Secondary | ICD-10-CM

## 2020-11-16 DIAGNOSIS — J189 Pneumonia, unspecified organism: Principal | ICD-10-CM

## 2020-11-16 LAB — HEMOGLOBIN AND HEMATOCRIT, BLOOD
HCT: 22.7 % — ABNORMAL LOW (ref 39.0–52.0)
Hemoglobin: 7.4 g/dL — ABNORMAL LOW (ref 13.0–17.0)

## 2020-11-16 LAB — EXPECTORATED SPUTUM ASSESSMENT W GRAM STAIN, RFLX TO RESP C

## 2020-11-16 LAB — KAPPA/LAMBDA LIGHT CHAINS
Kappa, lambda light chain ratio: 1278.86 — ABNORMAL HIGH (ref 0.26–1.65)
Lambda free light chains: 10.3 mg/L (ref 5.7–26.3)

## 2020-11-16 LAB — HIV ANTIBODY (ROUTINE TESTING W REFLEX): HIV Screen 4th Generation wRfx: NONREACTIVE

## 2020-11-16 LAB — CBC
HCT: 21.2 % — ABNORMAL LOW (ref 39.0–52.0)
Hemoglobin: 7 g/dL — ABNORMAL LOW (ref 13.0–17.0)
MCH: 31.5 pg (ref 26.0–34.0)
MCHC: 33 g/dL (ref 30.0–36.0)
MCV: 95.5 fL (ref 80.0–100.0)
Platelets: 134 10*3/uL — ABNORMAL LOW (ref 150–400)
RBC: 2.22 MIL/uL — ABNORMAL LOW (ref 4.22–5.81)
RDW: 19.8 % — ABNORMAL HIGH (ref 11.5–15.5)
WBC: 3.6 10*3/uL — ABNORMAL LOW (ref 4.0–10.5)
nRBC: 0 % (ref 0.0–0.2)

## 2020-11-16 LAB — COMPREHENSIVE METABOLIC PANEL
ALT: 28 U/L (ref 0–44)
AST: 27 U/L (ref 15–41)
Albumin: 1.6 g/dL — ABNORMAL LOW (ref 3.5–5.0)
Alkaline Phosphatase: 48 U/L (ref 38–126)
Anion gap: 3 — ABNORMAL LOW (ref 5–15)
BUN: 21 mg/dL (ref 8–23)
CO2: 24 mmol/L (ref 22–32)
Calcium: 7 mg/dL — ABNORMAL LOW (ref 8.9–10.3)
Chloride: 104 mmol/L (ref 98–111)
Creatinine, Ser: 1.17 mg/dL (ref 0.61–1.24)
GFR, Estimated: >60 mL/min (ref >60)
Glucose, Bld: 104 mg/dL — ABNORMAL HIGH (ref 70–99)
Potassium: 3 mmol/L — ABNORMAL LOW (ref 3.5–5.1)
Sodium: 131 mmol/L — ABNORMAL LOW (ref 135–145)
Total Bilirubin: 0.7 mg/dL (ref 0.3–1.2)
Total Protein: 8.3 g/dL — ABNORMAL HIGH (ref 6.5–8.1)

## 2020-11-16 LAB — STREP PNEUMONIAE URINARY ANTIGEN: Strep Pneumo Urinary Antigen: NEGATIVE

## 2020-11-16 LAB — MAGNESIUM: Magnesium: 1.8 mg/dL (ref 1.7–2.4)

## 2020-11-16 MED ORDER — CALCIUM GLUCONATE-NACL 1-0.675 GM/50ML-% IV SOLN
1.0000 g | Freq: Once | INTRAVENOUS | Status: AC
  Administered 2020-11-16: 1000 mg via INTRAVENOUS
  Filled 2020-11-16: qty 50

## 2020-11-16 MED ORDER — AMOXICILLIN-POT CLAVULANATE 875-125 MG PO TABS: 1.0000 | ORAL_TABLET | Freq: Two times a day (BID) | ORAL | 0 refills | Status: AC

## 2020-11-16 MED ORDER — COMBIVENT RESPIMAT 20-100 MCG/ACT IN AERS: 1.0000 | INHALATION_SPRAY | Freq: Four times a day (QID) | RESPIRATORY_TRACT | 2 refills | Status: DC | PRN

## 2020-11-16 MED ORDER — POTASSIUM CHLORIDE CRYS ER 20 MEQ PO TBCR
40.0000 meq | EXTENDED_RELEASE_TABLET | Freq: Two times a day (BID) | ORAL | Status: DC
  Administered 2020-11-16: 40 meq via ORAL
  Filled 2020-11-16: qty 2

## 2020-11-16 NOTE — Discharge Summary (Signed)
Physician Discharge Summary  Paul Norton ION:[id number] DOB: December 25, 1955 DOA: 11/15/2020  PCP: Carlena Hurl, PA-C  Admit date: 11/15/2020  Discharge date: 11/16/2020  Admitted From:Home  Disposition:  Home  Recommendations for Outpatient Follow-up:  1. Follow up with PCP in 1-2 weeks 2. Continue on Augmentin as prescribed for pneumonia and finished course of treatment 3. Combivent ordered as needed for shortness of breath or wheezing 4. Continue other home medications as prior and follow-up with oncology regarding further treatments for multiple [place]: None  Equipment/Devices: None  Discharge Condition:Stable  CODE STATUS: DNR  Diet recommendation: Heart Healthy  Brief/Interim Summary:  Paul Norton  is a 65 y.o. male, with history of medical noncompliance, hypertension, hyperlipidemia, presents the ED with a chief complaint of shortness of breath.  He reports that it started with a cough last night.  Patient was admitted with acute hypoxemic respiratory failure in the setting of community-acquired pneumonia.  He was started on IV antibiotics with azithromycin and Rocephin as well as some breathing treatments with rapid improvement in his condition.  As part of the work-up, a D-dimer was obtained which was noted to be elevated and he was offered further imaging studies to assess for the possibility of PE, but refused to have any further studies done.  He was also noted to have some multiple electrolyte abnormalities as well as AKI which appear to have both improved as well.  He seems quite preoccupied with the need for him to have further chemotherapy for his multiple myeloma and did not want to have any further testing or medications.  He was weaned off with his oxygen requirements completely and was able to ambulate without any further need for oxygenation and stated that he felt well enough to go home.  At this time, he is stable for discharge and will be  transition to oral antibiotics as noted above.  Patient does have full decision-making capacity and is alert and oriented x3 and understands the risks that he takes are being discharged without completing the full work-up.  I still anticipate that he will do well and overall is stable.  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  Sepsis ruled out.  Discharge Instructions  Discharge Instructions    Diet - low sodium heart healthy   Complete by: As directed    Increase activity slowly   Complete by: As directed      Allergies as of 11/16/2020   No Known Allergies     Medication List    TAKE these medications   acetaminophen 325 MG tablet Commonly known as: TYLENOL Take 2 tablets (650 mg total) by mouth every 6 (six) hours as needed for mild pain (or Fever >/= 101).   amLODipine 10 MG tablet Commonly known as: NORVASC Take 1 tablet (10 mg total) by mouth daily.   amoxicillin-clavulanate 875-125 MG tablet Commonly known as: Augmentin Take 1 tablet by mouth 2 (two) times daily for 7 days.   calcium-vitamin D 500-200 MG-UNIT tablet Commonly known as: OSCAL WITH D Take 1 tablet by mouth 2 (two) times daily.   Combivent Respimat 20-100 MCG/ACT Aers respimat Generic drug: Ipratropium-Albuterol Inhale 1 puff into the  lungs every 6 (six) hours as needed for wheezing or shortness of breath.   Emergen-C Immune Plus Pack Take 1 tablet by mouth 2 (two) times daily.   feeding supplement Liqd Take 237 mLs by mouth 3 (three) times daily between meals.   hydrocortisone 25 MG suppository Commonly known as: ANUSOL-HC Place 1  suppository (25 mg total) rectally 2 (two) times daily.   lenalidomide 15 MG capsule Commonly known as: REVLIMID Take 1 capsule (15 mg total) by mouth daily. 14 days on, 7 days off   metoprolol tartrate 100 MG tablet Commonly known as: LOPRESSOR Take 1 tablet (100 mg total) by mouth 2 (two) times daily.       Follow-up Information    Tysinger, Camelia Eng, PA-C. Schedule an appointment as soon as possible for a visit in 1 week(s).   Specialty: Family Medicine Contact information: [address] [place] [address] [phone]              No Known Allergies  Consultations:  None   Procedures/Studies: CT ABDOMEN PELVIS WO CONTRAST  Result Date: 10/30/2020 CLINICAL DATA:  Cancer of unknown primary, multiple lytic lesions in the calvaria noted incidentally. Dizziness, unintended weight loss. EXAM: CT CHEST, ABDOMEN AND PELVIS WITHOUT CONTRAST TECHNIQUE: Multidetector CT imaging of the chest, abdomen and pelvis was performed following the standard protocol without IV contrast. COMPARISON:  CT head 10/29/2020, chest radiograph 10/29/2020 FINDINGS: CT CHEST FINDINGS Cardiovascular: Cardiomegaly. Coronary artery calcifications. Hypoattenuation of the cardiac blood pool compatible with anemia. The aortic root is suboptimally assessed given cardiac pulsation artifact. Atherosclerotic plaque within the normal caliber aorta. Shared origin of the brachiocephalic and left common carotid arteries. Proximal great vessels are otherwise unremarkable. Central pulmonary arteries are top-normal caliber. No large central filling defects within limitations of a nonenhanced exam. Mediastinum/Nodes: No mediastinal fluid or gas. Normal thyroid gland and thoracic inlet. No acute abnormality of the trachea or esophagus. No worrisome mediastinal or axillary adenopathy. Hilar nodal evaluation is limited in the absence of intravenous contrast media. Lungs/Pleura: No consolidation, features of edema,  pneumothorax, or effusion. No suspicious pulmonary nodules or masses. Some dependent atelectasis. Evaluation slightly limited by mild respiratory motion artifact. Musculoskeletal: Widespread extensive lytic mottling throughout the axial and appendicular skeleton. Multiple rib fractures are seen in various stages of healing many of which are presumed to be pathologic in nature. Severe degenerative changes are present in the bilateral shoulders including larger heterotopic ossifications seen in the left shoulder recess. Bridging syndesmophytes across much of the thoracic levels and partial bridging across the spinous processes, could reflect some underlying spondyloarthropathy. CT ABDOMEN PELVIS FINDINGS Hepatobiliary: No visible focal liver lesion with limitations of an unenhanced CT. Smooth surface contour. Normal hepatic attenuation. Normal gallbladder and biliary tree without visible calcified gallstone. Pancreas: Mild pancreatic atrophy. No pancreatic ductal dilatation or surrounding inflammatory changes. Spleen: Splenomegaly within enlarged, lobular spleen. No visible focal splenic lesion within the limitations of this unenhanced exam. Adrenals/Urinary Tract: No adrenal mass or hemorrhage. Slight under rotation of the bilateral kidneys, anatomic variant. No visible or contour deforming renal lesion. No urolithiasis or hydronephrosis. Urinary bladder is largely decompressed at the time of exam and therefore poorly evaluated by CT imaging. Mild circumferential bladder wall thickening may be related underdistention or chronic outlet obstruction given marked indentation of bladder base by an enlarged prostate. Stomach/Bowel: Distal esophagus, stomach and duodenum are unremarkable. No small bowel thickening or dilatation. Normal appendix in the right lower quadrant. Extensive distal colonic diverticulosis without active  acute inflammation to suggest active diverticulitis. Some mild mural thickening in the sigmoid may  be reflective of prior inflammation though warrants further evaluation with outpatient colonoscopy if not recently performed. Vascular/Lymphatic: Atherosclerotic calcifications within the abdominal aorta and branch vessels. No aneurysm or ectasia. No enlarged abdominopelvic lymph nodes. Reproductive: Enlarged prostate with few typically benign punctate calcifications. Trace right hydrocele. Other: No abdominopelvic free air or fluid. No bowel containing hernia. Musculoskeletal: Extensive lytic foci and bony remodeling throughout the axial and appendicular skeleton of the abdomen and pelvis including the proximal femora as well. Bony ankylosis of the L4-L5 and lumbosacral junction as well as across the bilateral SI joints. IMPRESSION: 1. Extensive lytic foci and bony mottling throughout the axial and appendicular skeleton of the chest, abdomen and pelvis including the proximal femora and humeri as well as multiple bilateral rib fractures in various stages of healing many of which are presumed to be pathologic in nature. Findings are concerning for a diffuse osseous metastatic disease versus multiple myeloma. 2. Mild mural thickening in the sigmoid may be reflective of prior inflammation given the numerous colonic diverticula within the segment though warrants further evaluation with outpatient colonoscopy if not recently performed. 3. Splenomegaly within enlarged, lobular spleen, nonspecific but can be seen in the setting of myeloproliferative disorder. 4. Cardiomegaly and coronary artery calcifications. Hypoattenuation of the cardiac blood pool suggestive of anemia. 5. Trace right hydrocele. 6. Mild circumferential bladder wall thickening may be related underdistention or chronic outlet obstruction given marked indentation of bladder base by an enlarged prostate. Could correlate with urinalysis to exclude cystitis. 7. Prostatomegaly. 8. Aortic Atherosclerosis (ICD10-I70.0). Electronically Signed   By: Lovena Le  M.D.   On: 10/30/2020 01:15   CT Head Wo Contrast  Result Date: 10/29/2020 CLINICAL DATA:  Dizziness, carbon monoxide exposure 2 months prior with COVID few weeks ago. Unintended weight loss. EXAM: CT HEAD WITHOUT CONTRAST TECHNIQUE: Contiguous axial images were obtained from the base of the skull through the vertex without intravenous contrast. COMPARISON:  None. FINDINGS: Brain: No evidence of acute infarction, hemorrhage, hydrocephalus, extra-axial collection, visible mass lesion or mass effect. Symmetric prominence of the ventricles, cisterns and sulci compatible with parenchymal volume loss. Patchy areas of white matter hypoattenuation are most compatible with chronic microvascular angiopathy. Vascular: Atherosclerotic calcification of the carotid siphons. No hyperdense vessel. Skull: Mottled appearance of the bone particularly towards the clivus and sphenoid bones with multiple sites of lytic lesions, largest in the posterior left parietal bone measuring 19 x 8 mm (3/42) a slightly more expansile lytic focus is also seen in the right temporal bone measuring 15 x 7 mm (3/19) and in the right mastoid measuring 11 x 12 mm (3/9). Additional scattered foci elsewhere. No calvarial fracture. No scalp swelling or hematoma. Sinuses/Orbits: Nodular mural thickening in the paranasal sinuses, predominantly in the maxillary sinuses. Mastoid air cells are predominantly clear. Middle ear cavities are clear. Other: None IMPRESSION: 1. Mottled appearance of the calvaria and skull base with multitude of lytic lesions concerning for metastatic disease or myeloma. 2. No acute intracranial abnormality is seen within the limitations of an unenhanced CT. Background of chronic microvascular angiopathy and parenchymal volume loss. These results were called by telephone at the time of interpretation on 10/29/2020 at 10:54 pm to provider [facility] , who verbally acknowledged these results. Electronically Signed   By: Lovena Le  M.D.   On: 10/29/2020 22:56   CT Chest Wo Contrast  Result Date: 10/30/2020 CLINICAL DATA:  Cancer  of unknown primary, multiple lytic lesions in the calvaria noted incidentally. Dizziness, unintended weight loss. EXAM: CT CHEST, ABDOMEN AND PELVIS WITHOUT CONTRAST TECHNIQUE: Multidetector CT imaging of the chest, abdomen and pelvis was performed following the standard protocol without IV contrast. COMPARISON:  CT head 10/29/2020, chest radiograph 10/29/2020 FINDINGS: CT CHEST FINDINGS Cardiovascular: Cardiomegaly. Coronary artery calcifications. Hypoattenuation of the cardiac blood pool compatible with anemia. The aortic root is suboptimally assessed given cardiac pulsation artifact. Atherosclerotic plaque within the normal caliber aorta. Shared origin of the brachiocephalic and left common carotid arteries. Proximal great vessels are otherwise unremarkable. Central pulmonary arteries are top-normal caliber. No large central filling defects within limitations of a nonenhanced exam. Mediastinum/Nodes: No mediastinal fluid or gas. Normal thyroid gland and thoracic inlet. No acute abnormality of the trachea or esophagus. No worrisome mediastinal or axillary adenopathy. Hilar nodal evaluation is limited in the absence of intravenous contrast media. Lungs/Pleura: No consolidation, features of edema, pneumothorax, or effusion. No suspicious pulmonary nodules or masses. Some dependent atelectasis. Evaluation slightly limited by mild respiratory motion artifact. Musculoskeletal: Widespread extensive lytic mottling throughout the axial and appendicular skeleton. Multiple rib fractures are seen in various stages of healing many of which are presumed to be pathologic in nature. Severe degenerative changes are present in the bilateral shoulders including larger heterotopic ossifications seen in the left shoulder recess. Bridging syndesmophytes across much of the thoracic levels and partial bridging across the spinous  processes, could reflect some underlying spondyloarthropathy. CT ABDOMEN PELVIS FINDINGS Hepatobiliary: No visible focal liver lesion with limitations of an unenhanced CT. Smooth surface contour. Normal hepatic attenuation. Normal gallbladder and biliary tree without visible calcified gallstone. Pancreas: Mild pancreatic atrophy. No pancreatic ductal dilatation or surrounding inflammatory changes. Spleen: Splenomegaly within enlarged, lobular spleen. No visible focal splenic lesion within the limitations of this unenhanced exam. Adrenals/Urinary Tract: No adrenal mass or hemorrhage. Slight under rotation of the bilateral kidneys, anatomic variant. No visible or contour deforming renal lesion. No urolithiasis or hydronephrosis. Urinary bladder is largely decompressed at the time of exam and therefore poorly evaluated by CT imaging. Mild circumferential bladder wall thickening may be related underdistention or chronic outlet obstruction given marked indentation of bladder base by an enlarged prostate. Stomach/Bowel: Distal esophagus, stomach and duodenum are unremarkable. No small bowel thickening or dilatation. Normal appendix in the right lower quadrant. Extensive distal colonic diverticulosis without active acute inflammation to suggest active diverticulitis. Some mild mural thickening in the sigmoid may be reflective of prior inflammation though warrants further evaluation with outpatient colonoscopy if not recently performed. Vascular/Lymphatic: Atherosclerotic calcifications within the abdominal aorta and branch vessels. No aneurysm or ectasia. No enlarged abdominopelvic lymph nodes. Reproductive: Enlarged prostate with few typically benign punctate calcifications. Trace right hydrocele. Other: No abdominopelvic free air or fluid. No bowel containing hernia. Musculoskeletal: Extensive lytic foci and bony remodeling throughout the axial and appendicular skeleton of the abdomen and pelvis including the proximal  femora as well. Bony ankylosis of the L4-L5 and lumbosacral junction as well as across the bilateral SI joints. IMPRESSION: 1. Extensive lytic foci and bony mottling throughout the axial and appendicular skeleton of the chest, abdomen and pelvis including the proximal femora and humeri as well as multiple bilateral rib fractures in various stages of healing many of which are presumed to be pathologic in nature. Findings are concerning for a diffuse osseous metastatic disease versus multiple myeloma. 2. Mild mural thickening in the sigmoid may be reflective of prior inflammation given the numerous colonic diverticula within  the segment though warrants further evaluation with outpatient colonoscopy if not recently performed. 3. Splenomegaly within enlarged, lobular spleen, nonspecific but can be seen in the setting of myeloproliferative disorder. 4. Cardiomegaly and coronary artery calcifications. Hypoattenuation of the cardiac blood pool suggestive of anemia. 5. Trace right hydrocele. 6. Mild circumferential bladder wall thickening may be related underdistention or chronic outlet obstruction given marked indentation of bladder base by an enlarged prostate. Could correlate with urinalysis to exclude cystitis. 7. Prostatomegaly. 8. Aortic Atherosclerosis (ICD10-I70.0). Electronically Signed   By: Lovena Le M.D.   On: 10/30/2020 01:15   DG Chest Port 1 View  Result Date: 11/15/2020 CLINICAL DATA:  Shortness of breath with cough EXAM: PORTABLE CHEST 1 VIEW COMPARISON:  Chest radiograph October 31, 2020 and chest CT October 30, 2020 FINDINGS: Increased opacity in the left base is concerning for pneumonia. There is underlying interstitial thickening throughout the lungs. The heart size and pulmonary vascularity are normal. No adenopathy. Lytic bone lesions are seen throughout the chest. IMPRESSION: Left base opacity concerning for acute pneumonia. Underlying probable degree of bronchitis. Heart size normal. Widespread  lytic bony lesions, an appearance that may well be indicative of multiple myeloma. Widespread metastatic disease could also present in this manner. Electronically Signed   By: Lowella Grip III M.D.   On: 11/15/2020 17:22   DG Chest Port 1 View  Result Date: 10/31/2020 CLINICAL DATA:  Altered mental status EXAM: PORTABLE CHEST 1 VIEW COMPARISON:  10/29/2020 FINDINGS: New elevation of the right hemidiaphragm with right basilar pleuroparenchymal process. Chronic interstitial changes. No pneumothorax. Similar cardiomediastinal contours. IMPRESSION: New elevation of the right hemidiaphragm with right basilar atelectasis/consolidation and possible pleural effusion. Electronically Signed   By: Macy Mis M.D.   On: 10/31/2020 11:44   DG Chest Port 1 View  Result Date: 10/29/2020 CLINICAL DATA:  Weakness EXAM: PORTABLE CHEST 1 VIEW COMPARISON:  None. FINDINGS: Cardiac shadow is enlarged. Lungs are well aerated bilaterally. Mild vascular congestion is noted without interstitial edema. No focal infiltrate or sizable effusion is seen. Degenerative changes of the shoulder joints and thoracic spine are noted. IMPRESSION: Mild vascular congestion without acute infiltrate or edema. Electronically Signed   By: Inez Catalina M.D.   On: 10/29/2020 23:07   CT BONE MARROW BIOPSY  Addendum Date: 11/14/2020   ADDENDUM REPORT: 11/14/2020 14:18 ADDENDUM: In the third paragraph of the procedure section below, the report reads " The 11 gauge coaxial bone biopsy needle was re-advanced into a slightly different location within the LEFT iliac marrow space, positioning was confirmed with CT imaging and an additional bone marrow biopsy was obtained." The report should read "the 11 gauge coaxial bone needle was readvanced into a slightly different location within the RIGHT iliac marrow space, positioning was confirmed with CT imaging and additional bone marrow biopsy was obtained. And quotations Ruthann Cancer, MD [facility] Radiology Electronically Signed   By: Ruthann Cancer MD   On: 11/14/2020 14:18   Result Date: 11/14/2020 INDICATION: 65 year old male with history of multiple myeloma. EXAM: CT-GUIDED BONE MARROW BIOPSY AND ASPIRATION MEDICATIONS: None ANESTHESIA/SEDATION: Fentanyl 37.5 mcg IV; Versed 1 mg IV Sedation Time: 16 minutes; The patient was continuously monitored during the procedure by the interventional radiology nurse under my direct supervision. COMPLICATIONS: None immediate. PROCEDURE: Informed consent was obtained from the patient following an explanation of the procedure, risks, benefits and alternatives. The patient understands, agrees and consents for the procedure. All questions were addressed. A  time out was performed prior to the initiation of the procedure. The patient was positioned prone and non-contrast localization CT was performed of the pelvis to demonstrate the iliac marrow spaces. The operative site was prepped and draped in the usual sterile fashion. Under sterile conditions and local anesthesia, a 22 gauge spinal needle was utilized for procedural planning. Next, an 11 gauge coaxial bone biopsy needle was advanced into the right iliac marrow space. Needle position was confirmed with CT imaging. Initially, a bone marrow aspiration was performed. Next, a bone marrow biopsy was obtained with the 11 gauge outer bone marrow device. The 11 gauge coaxial bone biopsy needle was re-advanced into a slightly different location within the left iliac marrow space, positioning was confirmed with CT imaging and an additional bone marrow biopsy was obtained. Samples were prepared with the cytotechnologist and deemed adequate. The needle was removed and superficial hemostasis was obtained with manual compression. A dressing was applied. The patient tolerated the procedure well without immediate post procedural complication. IMPRESSION: Successful CT guided right iliac  bone marrow aspiration and core biopsy. Ruthann Cancer, MD [facility] Radiology Electronically Signed: By: Ruthann Cancer MD On: 11/01/2020 10:52   ECHOCARDIOGRAM COMPLETE  Result Date: 10/30/2020    ECHOCARDIOGRAM REPORT   Patient Name:   Paul Norton Date of Exam: 10/30/2020 Medical Rec #:  [id number]             Height:       69.0 in Accession #:    [id number]            Weight:       151.9 lb Date of Birth:  1956-01-24              BSA:          1.838 m Patient Age:    18 years              BP:           129/58 mmHg Patient Gender: M                     HR:           83 bpm. Exam Location:  [facility] Procedure: 2D Echo, Cardiac Doppler and Color Doppler Indications:    Elevated Troponin  History:        Patient has no prior history of Echocardiogram examinations.                 Risk Factors:Hypertension and Dyslipidemia. COVID-19 virus                 infection, Hypercalcemia, Moderate protein-calorie malnutrition,                 AKI (acute kidney injury) ([place]).  Sonographer:    Alvino Chapel RCS Referring Phys: [place]  1. Left ventricular ejection fraction, by estimation, is 60 to 65%. The left ventricle has normal function. The left ventricle has no regional wall motion abnormalities. Left ventricular diastolic parameters are indeterminate. Elevated left atrial pressure.  2. Right ventricular systolic function is mildly reduced. The right ventricular size is moderately enlarged. There is moderately elevated pulmonary artery systolic pressure.  3. Left atrial size was severely dilated.  4. Right atrial size was severely dilated.  5. The mitral valve is normal in structure. Mild mitral valve regurgitation. No evidence of mitral stenosis.  6. The aortic valve is  tricuspid. Aortic valve regurgitation is not visualized. No aortic stenosis is present.  7. The inferior vena cava is dilated in size with <50% respiratory  variability, suggesting right atrial pressure of 15 mmHg. FINDINGS  Left Ventricle: Left ventricular ejection fraction, by estimation, is 60 to 65%. The left ventricle has normal function. The left ventricle has no regional wall motion abnormalities. The left ventricular internal cavity size was normal in size. There is  no left ventricular hypertrophy. Left ventricular diastolic parameters are indeterminate. Elevated left atrial pressure. Right Ventricle: The right ventricular size is moderately enlarged. Right vetricular wall thickness was not assessed. Right ventricular systolic function is mildly reduced. There is moderately elevated pulmonary artery systolic pressure. The tricuspid regurgitant velocity is 2.99 m/s, and with an assumed right atrial pressure of 15 mmHg, the estimated right ventricular systolic pressure is 32.3 mmHg. Left Atrium: Left atrial size was severely dilated. Right Atrium: Right atrial size was severely dilated. Pericardium: There is no evidence of pericardial effusion. Mitral Valve: The mitral valve is normal in structure. Mild mitral valve regurgitation. No evidence of mitral valve stenosis. Tricuspid Valve: The tricuspid valve is normal in structure. Tricuspid valve regurgitation is mild . No evidence of tricuspid stenosis. Aortic Valve: The aortic valve is tricuspid. Aortic valve regurgitation is not visualized. No aortic stenosis is present. Aortic valve mean gradient measures 5.8 mmHg. Aortic valve peak gradient measures 12.0 mmHg. Aortic valve area, by VTI measures 3.01  cm. Pulmonic Valve: The pulmonic valve was not well visualized. Pulmonic valve regurgitation is not visualized. No evidence of pulmonic stenosis. Aorta: The aortic root is normal in size and structure. Pulmonary Artery: Moderate pulmonary HTN, PASP is 51 mmHg. Venous: The inferior vena cava is dilated in size with less than 50% respiratory variability, suggesting right atrial pressure of 15 mmHg. IAS/Shunts: No  atrial level shunt detected by color flow Doppler.  LEFT VENTRICLE PLAX 2D LVIDd:         5.20 cm  Diastology LVIDs:         3.50 cm  LV e' medial:    6.20 cm/s LV PW:         0.90 cm  LV E/e' medial:  23.2 LV IVS:        1.10 cm  LV e' lateral:   13.80 cm/s LVOT diam:     2.20 cm  LV E/e' lateral: 10.4 LV SV:         98 LV SV Index:   53 LVOT Area:     3.80 cm  RIGHT VENTRICLE RV S prime:     15.90 cm/s TAPSE (M-mode): 2.3 cm LEFT ATRIUM              Index       RIGHT ATRIUM           Index LA diam:        4.90 cm  2.67 cm/m  RA Area:     27.80 cm LA Vol (A2C):   141.0 ml 76.71 ml/m RA Volume:   96.80 ml  52.67 ml/m LA Vol (A4C):   128.0 ml 69.64 ml/m LA Biplane Vol: 138.0 ml 75.08 ml/m  AORTIC VALVE AV Area (Vmax):    2.79 cm AV Area (Vmean):   2.74 cm AV Area (VTI):     3.01 cm AV Vmax:           172.87 cm/s AV Vmean:          112.345 cm/s AV VTI:  0.324 m AV Peak Grad:      12.0 mmHg AV Mean Grad:      5.8 mmHg LVOT Vmax:         127.00 cm/s LVOT Vmean:        80.900 cm/s LVOT VTI:          0.257 m LVOT/AV VTI ratio: 0.79  AORTA Ao Root diam: 3.00 cm MITRAL VALVE                 TRICUSPID VALVE MV Area (PHT): 4.54 cm      TR Peak grad:   35.8 mmHg MV Decel Time: 167 msec      TR Vmax:        299.00 cm/s MR Peak grad:    94.9 mmHg MR Mean grad:    62.0 mmHg   SHUNTS MR Vmax:         487.00 cm/s Systemic VTI:  0.26 m MR Vmean:        371.0 cm/s  Systemic Diam: 2.20 cm MR PISA:         2.26 cm MR PISA Eff ROA: 15 mm MR PISA Radius:  0.60 cm MV E velocity: 144.00 cm/s MV A velocity: 45.90 cm/s MV E/A ratio:  3.14 Carlyle Dolly MD Electronically signed by Carlyle Dolly MD Signature Date/Time: 10/30/2020/12:07:16 PM    Final       Discharge Exam: Vitals:   11/16/20 0806 11/16/20 1309  BP:    Pulse:    Resp:    Temp: 97.7 F (36.5 C) (!) 97 F (36.1 C)  SpO2:     Vitals:   11/16/20 0800 11/16/20 0803 11/16/20 0806 11/16/20 1309  BP:  (!) 133/50    Pulse: 91     Resp:       Temp:   97.7 F (36.5 C) (!) 97 F (36.1 C)  TempSrc:   Axillary Axillary  SpO2: 97%     Weight:      Height:        General: Pt is alert, awake, not in acute distress Cardiovascular: RRR, S1/S2 +, no rubs, no gallops Respiratory: CTA bilaterally, no wheezing, no rhonchi Abdominal: Soft, NT, ND, bowel sounds + Extremities: no edema, no cyanosis    The results of significant diagnostics from this hospitalization (including imaging, microbiology, ancillary and laboratory) are listed below for reference.     Microbiology: Recent Results (from the past 240 hour(s))  Culture, blood (x 2)     Status: None (Preliminary result)   Collection Time: 11/15/20  5:00 PM   Specimen: BLOOD LEFT FOREARM  Result Value Ref Range Status   Specimen Description BLOOD LEFT FOREARM  Final   Special Requests   Final    BOTTLES DRAWN AEROBIC AND ANAEROBIC Blood Culture adequate volume   Culture   Final    NO GROWTH < 24 HOURS Performed at [facility], [address] [place], [place] [address]    Report Status PENDING  Incomplete  Culture, blood (x 2)     Status: None (Preliminary result)   Collection Time: 11/15/20  5:00 PM   Specimen: BLOOD  Result Value Ref Range Status   Specimen Description BLOOD LEFT ANTECUBITAL  Final   Special Requests   Final    BOTTLES DRAWN AEROBIC AND ANAEROBIC Blood Culture adequate volume   Culture   Final    NO GROWTH < 24 HOURS Performed at [facility], [address], [place], [place] [address]  Report Status PENDING  Incomplete  Resp Panel by RT-PCR (Flu A&B, Covid) Nasopharyngeal Swab     Status: None   Collection Time: 11/15/20  5:07 PM   Specimen: Nasopharyngeal Swab; Nasopharyngeal(NP) swabs in vial transport medium  Result Value Ref Range Status   SARS Coronavirus 2 by RT PCR NEGATIVE NEGATIVE Final    Comment: (NOTE) SARS-CoV-2 target nucleic acids are NOT DETECTED.  The SARS-CoV-2 RNA is generally detectable in upper respiratory specimens  during the acute phase of infection. The lowest concentration of SARS-CoV-2 viral copies this assay can detect is 138 copies/mL. A negative result does not preclude SARS-Cov-2 infection and should not be used as the sole basis for treatment or other patient management decisions. A negative result may occur with  improper specimen collection/handling, submission of specimen other than nasopharyngeal swab, presence of viral mutation(s) within the areas targeted by this assay, and inadequate number of viral copies(<138 copies/mL). A negative result must be combined with clinical observations, patient history, and epidemiological information. The expected result is Negative.  Fact Sheet for Patients:  EntrepreneurPulse.com.au  Fact Sheet for Healthcare Providers:  IncredibleEmployment.be  This test is no t yet approved or cleared by the [place] FDA and  has been authorized for detection and/or diagnosis of SARS-CoV-2 by FDA under an Emergency Use Authorization (EUA). This EUA will remain  in effect (meaning this test can be used) for the duration of the COVID-19 declaration under Section 564(b)(1) of the Act, 21 U.S.C.section 360bbb-3(b)(1), unless the authorization is terminated  or revoked sooner.       Influenza A by PCR NEGATIVE NEGATIVE Final   Influenza B by PCR NEGATIVE NEGATIVE Final    Comment: (NOTE) The Xpert Xpress SARS-CoV-2/FLU/RSV plus assay is intended as an aid in the diagnosis of influenza from Nasopharyngeal swab specimens and should not be used as a sole basis for treatment. Nasal washings and aspirates are unacceptable for Xpert Xpress SARS-CoV-2/FLU/RSV testing.  Fact Sheet for Patients: EntrepreneurPulse.com.au  Fact Sheet for Healthcare Providers: IncredibleEmployment.be  This test is not yet approved or cleared by the [place] FDA and has been authorized for detection  and/or diagnosis of SARS-CoV-2 by FDA under an Emergency Use Authorization (EUA). This EUA will remain in effect (meaning this test can be used) for the duration of the COVID-19 declaration under Section 564(b)(1) of the Act, 21 U.S.C. section 360bbb-3(b)(1), unless the authorization is terminated or revoked.  Performed at [facility], [address] [place], [place] [address]   Culture, sputum-assessment     Status: None   Collection Time: 11/16/20 12:29 AM   Specimen: Expectorated Sputum  Result Value Ref Range Status   Specimen Description EXPECTORATED SPUTUM  Final   Special Requests NONE  Final   Sputum evaluation   Final    THIS SPECIMEN IS ACCEPTABLE FOR SPUTUM CULTURE Performed at [facility], [address], [place], [place] [address]    Report Status 11/16/2020 FINAL  Final     Labs: BNP (last 3 results) Recent Labs    10/31/20 0432  BNP 680.8*   Basic Metabolic Panel: Recent Labs  Lab 11/15/20 1253 11/15/20 1700 11/15/20 1943 11/16/20 0605  NA 127* 126*  --  131*  K 2.7* 2.5*  --  3.0*  CL 97* 97*  --  104  CO2 22 22  --  24  GLUCOSE 113* 119*  --  104*  BUN 29* 30*  --  21  CREATININE 1.67* 1.67*  --  1.17  CALCIUM 7.1* 6.8*  --  7.0*  MG 1.0*  --  1.0* 1.8   Liver Function Tests: Recent Labs  Lab 11/15/20 1253 11/15/20 1700 11/16/20 0605  AST 36 36 27  ALT 36 34 28  ALKPHOS 60 58 48  BILITOT 1.0 0.9 0.7  PROT 9.9* 10.0* 8.3*  ALBUMIN 2.0* 2.0* 1.6*   No results for input(s): LIPASE, AMYLASE in the last 168 hours. No results for input(s): AMMONIA in the last 168 hours. CBC: Recent Labs  Lab 11/15/20 1253 11/15/20 1700 11/16/20 0605 11/16/20 1238  WBC 3.9* 4.0 3.6*  --   NEUTROABS 3.2 3.3  --   --   HGB 8.2* 8.1* 7.0* 7.4*  HCT 24.8* 24.4* 21.2* 22.7*  MCV 94.3 93.1 95.5  --   PLT 156 153 134*  --    Cardiac Enzymes: No results for input(s): CKTOTAL, CKMB, CKMBINDEX, TROPONINI in the last 168 hours. BNP: Invalid input(s):  POCBNP CBG: No results for input(s): GLUCAP in the last 168 hours. D-Dimer Recent Labs    11/15/20 1943  DDIMER 3.31*   Hgb A1c No results for input(s): HGBA1C in the last 72 hours. Lipid Profile No results for input(s): CHOL, HDL, LDLCALC, TRIG, CHOLHDL, LDLDIRECT in the last 72 hours. Thyroid function studies No results for input(s): TSH, T4TOTAL, T3FREE, THYROIDAB in the last 72 hours.  Invalid input(s): FREET3 Anemia work up No results for input(s): VITAMINB12, FOLATE, FERRITIN, TIBC, IRON, RETICCTPCT in the last 72 hours. Urinalysis    Component Value Date/Time   COLORURINE YELLOW 10/29/2020 2356   APPEARANCEUR HAZY (A) 10/29/2020 2356   LABSPEC 1.014 10/29/2020 2356   PHURINE 7.0 10/29/2020 2356   GLUCOSEU NEGATIVE 10/29/2020 2356   HGBUR NEGATIVE 10/29/2020 2356   BILIRUBINUR NEGATIVE 10/29/2020 [place] 10/29/2020 2356   PROTEINUR 100 (A) 10/29/2020 2356   NITRITE NEGATIVE 10/29/2020 2356   LEUKOCYTESUR NEGATIVE 10/29/2020 2356   Sepsis Labs Invalid input(s): PROCALCITONIN,  WBC,  LACTICIDVEN Microbiology Recent Results (from the past 240 hour(s))  Culture, blood (x 2)     Status: None (Preliminary result)   Collection Time: 11/15/20  5:00 PM   Specimen: BLOOD LEFT FOREARM  Result Value Ref Range Status   Specimen Description BLOOD LEFT FOREARM  Final   Special Requests   Final    BOTTLES DRAWN AEROBIC AND ANAEROBIC Blood Culture adequate volume   Culture   Final    NO GROWTH < 24 HOURS Performed at [facility], [name], [place], [place] [address]    Report Status PENDING  Incomplete  Culture, blood (x 2)     Status: None (Preliminary result)   Collection Time: 11/15/20  5:00 PM   Specimen: BLOOD  Result Value Ref Range Status   Specimen Description BLOOD LEFT ANTECUBITAL  Final   Special Requests   Final    BOTTLES DRAWN AEROBIC AND ANAEROBIC Blood Culture adequate volume   Culture   Final    NO GROWTH < 24 HOURS Performed at  [facility], [address], [place], [place] [address]    Report Status PENDING  Incomplete  Resp Panel by RT-PCR (Flu A&B, Covid) Nasopharyngeal Swab     Status: None   Collection Time: 11/15/20  5:07 PM   Specimen: Nasopharyngeal Swab; Nasopharyngeal(NP) swabs in vial transport medium  Result Value Ref Range Status   SARS Coronavirus 2 by RT PCR NEGATIVE NEGATIVE Final    Comment: (NOTE) SARS-CoV-2 target nucleic acids are  NOT DETECTED.  The SARS-CoV-2 RNA is generally detectable in upper respiratory specimens during the acute phase of infection. The lowest concentration of SARS-CoV-2 viral copies this assay can detect is 138 copies/mL. A negative result does not preclude SARS-Cov-2 infection and should not be used as the sole basis for treatment or other patient management decisions. A negative result may occur with  improper specimen collection/handling, submission of specimen other than nasopharyngeal swab, presence of viral mutation(s) within the areas targeted by this assay, and inadequate number of viral copies(<138 copies/mL). A negative result must be combined with clinical observations, patient history, and epidemiological information. The expected result is Negative.  Fact Sheet for Patients:  EntrepreneurPulse.com.au  Fact Sheet for Healthcare Providers:  IncredibleEmployment.be  This test is no t yet approved or cleared by the [place] FDA and  has been authorized for detection and/or diagnosis of SARS-CoV-2 by FDA under an Emergency Use Authorization (EUA). This EUA will remain  in effect (meaning this test can be used) for the duration of the COVID-19 declaration under Section 564(b)(1) of the Act, 21 U.S.C.section 360bbb-3(b)(1), unless the authorization is terminated  or revoked sooner.       Influenza A by PCR NEGATIVE NEGATIVE Final   Influenza B by PCR NEGATIVE NEGATIVE Final    Comment: (NOTE) The Xpert Xpress  SARS-CoV-2/FLU/RSV plus assay is intended as an aid in the diagnosis of influenza from Nasopharyngeal swab specimens and should not be used as a sole basis for treatment. Nasal washings and aspirates are unacceptable for Xpert Xpress SARS-CoV-2/FLU/RSV testing.  Fact Sheet for Patients: EntrepreneurPulse.com.au  Fact Sheet for Healthcare Providers: IncredibleEmployment.be  This test is not yet approved or cleared by the [place] FDA and has been authorized for detection and/or diagnosis of SARS-CoV-2 by FDA under an Emergency Use Authorization (EUA). This EUA will remain in effect (meaning this test can be used) for the duration of the COVID-19 declaration under Section 564(b)(1) of the Act, 21 U.S.C. section 360bbb-3(b)(1), unless the authorization is terminated or revoked.  Performed at [facility], [address], [place], [place] [address]   Culture, sputum-assessment     Status: None   Collection Time: 11/16/20 12:29 AM   Specimen: Expectorated Sputum  Result Value Ref Range Status   Specimen Description EXPECTORATED SPUTUM  Final   Special Requests NONE  Final   Sputum evaluation   Final    THIS SPECIMEN IS ACCEPTABLE FOR SPUTUM CULTURE Performed at [facility], [address], [place], [place] [address]    Report Status 11/16/2020 FINAL  Final     Time coordinating discharge: 35 minutes  SIGNED:   Rodena Goldmann, DO [facility] 11/16/2020, 1:39 PM  If 7PM-7AM, please contact night-coverage [email or web address]

## 2020-11-16 NOTE — Consult Note (Signed)
Consultation Note Date: 11/16/2020   Patient Name: Paul Norton  DOB: September 13, 1955  MRN: [id number]  Age / Sex: 65 y.o., male  PCP: Paul Lloyd Paul Eng, PA-C Referring Physician: Rodena Goldmann, DO  Reason for Consultation: Establishing goals of care and Psychosocial/spiritual support  HPI/Patient Profile: 65 y.o. male  with past medical history of new diagnosis of multiple myeloma, HTN/HLD, history of medical noncompliance admitted on 11/15/2020 with acute respiratory failure with hypoxia/pneumonia.   Clinical Assessment and Goals of Care: I have reviewed medical records including EPIC notes, labs and imaging, received report from bedside nursing staff, examined the patient and met at bedside to discuss diagnosis prognosis, GOC, EOL wishes, disposition and options.  I introduced Palliative Medicine as specialized medical care for people living with serious illness. It focuses on providing relief from the symptoms and stress of Norton serious illness.   We discussed current illness and what it means in the larger context of on-going co-morbidities.  Paul Norton tells me that he would like to get treatment for his pneumonia, overcome it as possible.  He tells me that he was supposed to see Dr. Raliegh Norton for cancer treatment today, and is unsure about his follow-ups.  He tells me that, at this point, he would continue to take chemotherapy as offered.  Hospice and Palliative Care services outpatient were explained and offered.  Questions and concerns were addressed.  The family was encouraged to call with questions or concerns.    HCPOA   NEXT OF KIN -Paul Norton names his brother, Paul Norton, as his healthcare surrogate    SUMMARY OF RECOMMENDATIONS   Continue to treat the treatable States he would like to continue chemotherapy as offered Considering short-term rehab if qualified   Code Status/Advance Care Planning:  DNR  Symptom  Management:   Per hospitalist, no additional needs at this time.  Palliative Prophylaxis:   Oral Care  Additional Recommendations (Limitations, Scope, Preferences):  Treat the treatable but no CPR or intubation  Psycho-social/Spiritual:   Desire for further Chaplaincy support:no  Additional Recommendations: Caregiving  Support/Resources  Prognosis:  Unable to determine, based on outcomes. Discharge Planning: To be determined, based on outcomes.      Primary Diagnoses: Present on Admission: . Acute respiratory failure with hypoxia ([place]) . Hyponatremia . Hypokalemia . Severe protein-calorie malnutrition Paul Norton: less than 60% of standard weight) ([place]) . AKI (acute kidney injury) ([place]) . Community acquired pneumonia   I have reviewed the medical record, interviewed the patient and family, and examined the patient. The following aspects are pertinent.  Past Medical History:  Diagnosis Date  . Colonoscopy refused 09/2017  . Hyperlipidemia   . Hypertension   . Refuses treatment 09/2017   refuses screenings such as cancer screening, refuses vaccines, refuses normal preventative care  . Vaccine refused by patient    all vaccines as of 09/2017   Social History   Socioeconomic History  . Marital status: Divorced    Spouse name: Not on file  . Number of children: Not on file  .  Years of education: Not on file  . Highest education level: Not on file  Occupational History  . Not on file  Tobacco Use  . Smoking status: Never Smoker  . Smokeless tobacco: Never Used  Substance and Sexual Activity  . Alcohol use: No  . Drug use: Never  . Sexual activity: Not on file  Other Topics Concern  . Not on file  Social History Narrative  . Not on file   Social Determinants of Health   Financial Resource Strain: Medium Risk  . Difficulty of Paying Living Expenses: Somewhat hard  Food Insecurity: No Food Insecurity  . Worried About Charity fundraiser in the Last Year:  Never true  . Ran Out of Food in the Last Year: Never true  Transportation Needs: No Transportation Needs  . Lack of Transportation (Medical): No  . Lack of Transportation (Non-Medical): No  Physical Activity: Not on file  Stress: Not on file  Social Connections: Not on file   History reviewed. No pertinent family history. Scheduled Meds: . feeding supplement  237 mL Oral TID BM  . heparin  5,000 Units Subcutaneous Q8H  . multivitamin with minerals  1 tablet Oral BID  . potassium chloride  40 mEq Oral BID   Continuous Infusions: . sodium chloride 100 mL/hr at 11/16/20 1147  . azithromycin    . cefTRIAXone (ROCEPHIN)  IV     PRN Meds:.acetaminophen, morphine injection, ondansetron (ZOFRAN) IV Medications Prior to Admission:  Prior to Admission medications   Medication Sig Start Date End Date Taking? Authorizing Provider  acetaminophen (TYLENOL) 325 MG tablet Take 2 tablets (650 mg total) by mouth every 6 (six) hours as needed for mild pain (or Fever >/= 101). 11/09/20  Yes Shahmehdi, Paul A, MD  amoxicillin-clavulanate (AUGMENTIN) 875-125 MG tablet Take 1 tablet by mouth 2 (two) times daily for 7 days. 11/16/20 11/23/20 Yes Paul Norton, Paul D, DO  calcium-vitamin Norton (OSCAL WITH Norton) 500-200 MG-UNIT tablet Take 1 tablet by mouth 2 (two) times daily. 11/09/20 12/09/20 Yes Shahmehdi, Valeria Batman, MD  feeding supplement (ENSURE ENLIVE / ENSURE PLUS) LIQD Take 237 mLs by mouth 3 (three) times daily between meals. 11/09/20  Yes Shahmehdi, Paul A, MD  Ipratropium-Albuterol (COMBIVENT RESPIMAT) 20-100 MCG/ACT AERS respimat Inhale 1 puff into the lungs every 6 (six) hours as needed for wheezing or shortness of breath. 11/16/20  Yes Paul Norton, Paul D, DO  Multiple Vitamins-Minerals (EMERGEN-C IMMUNE PLUS) PACK Take 1 tablet by mouth 2 (two) times daily. 09/06/20  Yes Tysinger, Paul Eng, PA-C  amLODipine (NORVASC) 10 MG tablet Take 1 tablet (10 mg total) by mouth daily. Patient not taking: No sig reported 03/31/20    Tysinger, Paul Eng, PA-C  hydrocortisone (ANUSOL-HC) 25 MG suppository Place 1 suppository (25 mg total) rectally 2 (two) times daily. Patient not taking: No sig reported 11/09/20   Paul James, MD  lenalidomide (REVLIMID) 15 MG capsule Take 1 capsule (15 mg total) by mouth daily. 14 days on, 7 days off Patient not taking: Reported on 11/15/2020 11/15/20   Derek Jack, MD  metoprolol tartrate (LOPRESSOR) 100 MG tablet Take 1 tablet (100 mg total) by mouth 2 (two) times daily. Patient not taking: No sig reported 03/31/20   Tysinger, Paul Eng, PA-C   No Known Allergies Review of Systems  Unable to perform ROS: Other  HENT: Positive for nosebleeds.     Physical Exam Vitals and nursing note reviewed.  Constitutional:      General: He is  not in acute distress.    Appearance: He is not ill-appearing.  HENT:     Mouth/Throat:     Mouth: Mucous membranes are moist.  Cardiovascular:     Rate and Rhythm: Normal rate.  Pulmonary:     Effort: Pulmonary effort is normal. No respiratory distress.  Musculoskeletal:        General: No swelling.  Skin:    General: Skin is warm and dry.  Neurological:     Mental Status: He is alert and oriented to person, place, and time.  Psychiatric:        Mood and Affect: Mood normal.        Behavior: Behavior normal.     Comments: Tangential at times     Vital Signs: BP (!) 121/53   Pulse 85   Temp (!) 97 F (36.1 C) (Axillary)   Resp (!) 32   Ht $R'5\' 9"'uH$  (1.753 m)   Wt 70.2 kg   SpO2 95%   BMI 22.85 kg/m  Pain Scale: 0-10   Pain Score: 0-No pain   SpO2: SpO2: 95 % O2 Device:SpO2: 95 % O2 Flow Rate: .O2 Flow Rate (L/min): 10 L/min  IO: Intake/output summary:   Intake/Output Summary (Last 24 hours) at 11/16/2020 1449 Last data filed at 11/15/2020 1918 Gross per 24 hour  Intake 2575 ml  Output --  Net 2575 ml    LBM: Last BM Date: 11/16/20 Baseline Weight: Weight: 67 kg Most recent weight: Weight: 70.2 kg     Palliative  Assessment/Data:   Flowsheet Rows   Flowsheet Row Most Recent Value  Intake Tab   Referral Department Hospitalist  Unit at Time of Referral Intermediate Care Unit  Palliative Care Primary Diagnosis Pulmonary  Date Notified 11/15/20  Palliative Care Type Return patient Palliative Care  Reason for referral Clarify Goals of Care  Date of Admission 11/15/20  Date first seen by Palliative Care 11/16/20  # of days Palliative referral response time 1 Day(s)  # of days Norton prior to Palliative referral 0  Clinical Assessment   Palliative Performance Scale Score 60%  Pain Max last 24 hours Not able to report  Pain Min Last 24 hours Not able to report  Dyspnea Max Last 24 Hours Not able to report  Dyspnea Min Last 24 hours Not able to report  Psychosocial & Spiritual Assessment   Palliative Care Outcomes       Time In: 0930 Time Out: 1020 Time Total: 50 minutes  Greater than 50%  of this time was spent counseling and coordinating care related to the above assessment and plan.  Signed by: Drue Novel, NP   Please contact Palliative Medicine Team phone at [phone] for questions and concerns.  For individual provider: See Shea Evans

## 2020-11-16 NOTE — Progress Notes (Signed)
Nutrition Follow-up  DOCUMENTATION CODES:      INTERVENTION:  Ensure Enlive po TID, each supplement provides 350 kcal and 20 grams of protein   Regular diet  NUTRITION DIAGNOSIS:   Inadequate oral intake related to poor appetite (taste changes) as evidenced by meal completion < 50% and significant wt loss.    GOAL:   Patient will meet greater than or equal to 90% of their needs  MONITOR:   PO intake,Supplement acceptance,Labs,Weight trends  REASON FOR ASSESSMENT:   Malnutrition Screening Tool    ASSESSMENT: Patient is a 65 yo male with hx of HTN, HLD, non-compliance who presents with shortness of breath. Recent admission - severe hypercalcemia,  multiple bony lytic lesions in the calvarium and spinal column, severe anemia, dehydration and acute renal failure.   Patient seen 4/28[facility] palliative treatment initiation- multiple myeloma.   Patient has been suffering from altered taste changes for the past few months. His breakfast tray is here and ~ 25-50% consumed. 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 He talked a while about his favorite restaurant in town and wants rib eye steak and french fries.   He weighed 88.5 kg in mid September and today weighs 70.2 kg. Loss of 20% x 7 months which is severe.     Medications: Ensure TID, MVI, Klor-con  IVF-NS$RemoveBeforeD'@100'LatHEvcDHNKxgX$  ml/hr  Drips: Zithromax and Rocephin  Intake/Output Summary (Last 24 hours) at 11/16/2020 1217 Last data filed at 11/15/2020 1918 Gross per 24 hour  Intake 2575 ml  Output --  Net 2575 ml     Diet Order:   Diet Order            Diet regular Room service appropriate? Yes; Fluid consistency: Thin  Diet effective now                 EDUCATION NEEDS:  Education needs have been addressed  Skin:  Skin Assessment: Reviewed RN Assessment  Last BM:  5/4  Height:   Ht Readings from Last 1 Encounters:  11/15/20 $RemoveB'5\' 9"'gduwtgur$  (1.753 m)     Weight:   Wt Readings from Last 1 Encounters:  11/15/20 70.2 kg    Ideal Body Weight:   73 kg  BMI:  Body mass index is 22.85 kg/m.  Estimated Nutritional Needs:   Kcal:   2100-2300  Protein:   95-100 gr  Fluid:   >2100 ml daily   Colman Cater MS,RD,CSG,LDN Contact: [email or web address]

## 2020-11-16 NOTE — TOC Progression Note (Signed)
Transition of Care [facility]) - Progression Note    Patient Details  Name: Sukhraj Esquivias MRN: [id number] Date of Birth: 01-Jun-1956  Transition of Care [facility]) CM/SW Contact  Salome Arnt, [place] Phone Number: 11/16/2020, 1:54 PM  Clinical Narrative:  TOC received consult for CHF screening. Discussed with MD and no diagnosis of CHF so LCSW cleared consult.           Expected Discharge Plan and Services           Expected Discharge Date: 11/16/20                                     Social Determinants of Health (SDOH) Interventions    Readmission Risk Interventions Readmission Risk Prevention Plan 11/03/2020 11/03/2020  Transportation Screening Complete -  [place] or [place] Complete Complete  Social Work Consult for [place] Planning/Counseling - Complete  Palliative Care Screening - Complete  Medication Review Press photographer) - Complete  Some recent data might be hidden

## 2020-11-16 NOTE — Progress Notes (Signed)
Chemotherapy and immunotherapy education packet given and discussed with pt and family in detail. Discussed diagnosis and staging, tx regimen, and intent of tx. Reviewed chemotherapy and immunotherapy medications and side effects, as well as pre-medications. Instructed on how to manage side effects at home, and when to call the clinic. Importance of fever/chills discussed with pt and family. Discussed precautions to implement at home after receiving tx, as well as self care strategies. Phone numbers provided for clinic during regular working hours, also how to reach the clinic after hours and on weekends. Pt and family provided the opportunity to ask questions - all questions answered to pt's and family satisfaction.   

## 2020-11-17 ENCOUNTER — Telehealth: Payer: Self-pay

## 2020-11-17 LAB — PROTEIN ELECTROPHORESIS, SERUM
A/G Ratio: 0.4 — ABNORMAL LOW (ref 0.7–1.7)
Albumin ELP: 2.7 g/dL — ABNORMAL LOW (ref 2.9–4.4)
Alpha-1-Globulin: 0.5 g/dL — ABNORMAL HIGH (ref 0.0–0.4)
Alpha-2-Globulin: 1.1 g/dL — ABNORMAL HIGH (ref 0.4–1.0)
Beta Globulin: 0.7 g/dL (ref 0.7–1.3)
Gamma Globulin: 4.4 g/dL — ABNORMAL HIGH (ref 0.4–1.8)
Globulin, Total: 6.8 g/dL — ABNORMAL HIGH (ref 2.2–3.9)
M-Spike, %: 4.3 g/dL — ABNORMAL HIGH
Total Protein ELP: 9.5 g/dL — ABNORMAL HIGH (ref 6.0–8.5)

## 2020-11-17 LAB — LEGIONELLA PNEUMOPHILA SEROGP 1 UR AG: L. pneumophila Serogp 1 Ur Ag: NEGATIVE

## 2020-11-17 NOTE — Telephone Encounter (Signed)
Has he already been discharged?

## 2020-11-17 NOTE — Telephone Encounter (Signed)
Pt. Was recently in the hospital for pneumonia, malnutrition, cough, sepsis, and hypokalemia and I got him scheduled for a f/u here on 11/23/20. Pt. Medications were gone over and reconciled.

## 2020-11-17 NOTE — Telephone Encounter (Signed)
Tammy called for Paul Norton stating they called him in an inhlaer at the hospital but it costs 500 dollars they are trying to get it approved by ins. But it may take 7-10 days. She wanted to know if he could take the [facility] instead that you gave him a while back.

## 2020-11-17 NOTE — Telephone Encounter (Signed)
Yes he is back at home as of yesterday.

## 2020-11-18 ENCOUNTER — Other Ambulatory Visit (HOSPITAL_COMMUNITY): Payer: Self-pay | Admitting: *Deleted

## 2020-11-18 DIAGNOSIS — C9000 Multiple myeloma not having achieved remission: Secondary | ICD-10-CM

## 2020-11-18 LAB — CULTURE, RESPIRATORY W GRAM STAIN: Culture: NORMAL

## 2020-11-18 NOTE — Telephone Encounter (Signed)
Patient has been informed of message from provider. Patient stated he still has the inhaler.

## 2020-11-18 NOTE — Telephone Encounter (Signed)
Yes use the one I gave/Breo if he has this.   If not, we have samples of other similar inhalers if someone wants to come pick up.    Let me know

## 2020-11-19 ENCOUNTER — Other Ambulatory Visit: Payer: Self-pay | Admitting: Family Medicine

## 2020-11-19 LAB — IMMUNOFIXATION ELECTROPHORESIS
IgA: 17 mg/dL — ABNORMAL LOW (ref 61–437)
IgG (Immunoglobin G), Serum: 6533 mg/dL — ABNORMAL HIGH (ref 603–1613)
IgM (Immunoglobulin M), Srm: 13 mg/dL — ABNORMAL LOW (ref 20–172)
Total Protein ELP: 9.4 g/dL — ABNORMAL HIGH (ref 6.0–8.5)

## 2020-11-19 MED ORDER — FLUCONAZOLE 100 MG PO TABS: 100.0000 mg | ORAL_TABLET | Freq: Every day | ORAL | 0 refills | Status: DC

## 2020-11-19 NOTE — Progress Notes (Signed)
Diflucan called in for presumed candidal oral infection

## 2020-11-20 LAB — CULTURE, BLOOD (ROUTINE X 2)
Culture: NO GROWTH
Culture: NO GROWTH
Special Requests: ADEQUATE
Special Requests: ADEQUATE

## 2020-11-21 ENCOUNTER — Other Ambulatory Visit: Payer: Self-pay

## 2020-11-21 ENCOUNTER — Inpatient Hospital Stay (HOSPITAL_COMMUNITY): Payer: Commercial Managed Care - PPO

## 2020-11-21 ENCOUNTER — Encounter (HOSPITAL_COMMUNITY): Payer: Self-pay | Admitting: Hematology

## 2020-11-21 ENCOUNTER — Inpatient Hospital Stay (HOSPITAL_BASED_OUTPATIENT_CLINIC_OR_DEPARTMENT_OTHER): Payer: Commercial Managed Care - PPO | Admitting: Hematology

## 2020-11-21 VITALS — BP 130/61 | HR 74 | Temp 96.9°F | Resp 18

## 2020-11-21 VITALS — BP 136/68 | HR 101 | Temp 97.8°F | Resp 20 | Ht 69.0 in | Wt 152.1 lb

## 2020-11-21 DIAGNOSIS — Z5112 Encounter for antineoplastic immunotherapy: Secondary | ICD-10-CM | POA: Diagnosis present

## 2020-11-21 DIAGNOSIS — C9000 Multiple myeloma not having achieved remission: Secondary | ICD-10-CM

## 2020-11-21 DIAGNOSIS — Z79899 Other long term (current) drug therapy: Secondary | ICD-10-CM | POA: Diagnosis not present

## 2020-11-21 DIAGNOSIS — Z7729 Contact with and (suspected ) exposure to other hazardous substances: Secondary | ICD-10-CM

## 2020-11-21 LAB — CBC WITH DIFFERENTIAL/PLATELET
Band Neutrophils: 4 %
Basophils Absolute: 0 10*3/uL (ref 0.0–0.1)
Basophils Relative: 0 %
Eosinophils Absolute: 0 10*3/uL (ref 0.0–0.5)
Eosinophils Relative: 0 %
HCT: 24.1 % — ABNORMAL LOW (ref 39.0–52.0)
Hemoglobin: 7.6 g/dL — ABNORMAL LOW (ref 13.0–17.0)
Lymphocytes Relative: 9 %
Lymphs Abs: 1.3 10*3/uL (ref 0.7–4.0)
MCH: 30.9 pg (ref 26.0–34.0)
MCHC: 31.5 g/dL (ref 30.0–36.0)
MCV: 98 fL (ref 80.0–100.0)
Metamyelocytes Relative: 2 %
Monocytes Absolute: 0.3 10*3/uL (ref 0.1–1.0)
Monocytes Relative: 2 %
Myelocytes: 3 %
Neutro Abs: 11.6 10*3/uL — ABNORMAL HIGH (ref 1.7–7.7)
Neutrophils Relative %: 78 %
Platelets: 262 10*3/uL (ref 150–400)
Promyelocytes Relative: 2 %
RBC: 2.46 MIL/uL — ABNORMAL LOW (ref 4.22–5.81)
RDW: 19.1 % — ABNORMAL HIGH (ref 11.5–15.5)
WBC: 14.1 10*3/uL — ABNORMAL HIGH (ref 4.0–10.5)
nRBC: 0 % (ref 0.0–0.2)

## 2020-11-21 LAB — COMPREHENSIVE METABOLIC PANEL
ALT: 24 U/L (ref 0–44)
AST: 27 U/L (ref 15–41)
Albumin: 1.7 g/dL — ABNORMAL LOW (ref 3.5–5.0)
Alkaline Phosphatase: 58 U/L (ref 38–126)
Anion gap: 3 — ABNORMAL LOW (ref 5–15)
BUN: 12 mg/dL (ref 8–23)
CO2: 29 mmol/L (ref 22–32)
Calcium: 9.3 mg/dL (ref 8.9–10.3)
Chloride: 99 mmol/L (ref 98–111)
Creatinine, Ser: 1.06 mg/dL (ref 0.61–1.24)
GFR, Estimated: >60 mL/min (ref >60)
Glucose, Bld: 106 mg/dL — ABNORMAL HIGH (ref 70–99)
Potassium: 4 mmol/L (ref 3.5–5.1)
Sodium: 131 mmol/L — ABNORMAL LOW (ref 135–145)
Total Bilirubin: 0.7 mg/dL (ref 0.3–1.2)
Total Protein: 9.9 g/dL — ABNORMAL HIGH (ref 6.5–8.1)

## 2020-11-21 LAB — MAGNESIUM: Magnesium: 1.5 mg/dL — ABNORMAL LOW (ref 1.7–2.4)

## 2020-11-21 MED ORDER — MONTELUKAST SODIUM 10 MG PO TABS
10.0000 mg | ORAL_TABLET | Freq: Once | ORAL | Status: AC
  Administered 2020-11-21: 10 mg via ORAL
  Filled 2020-11-21: qty 1

## 2020-11-21 MED ORDER — MAGNESIUM SULFATE 2 GM/50ML IV SOLN
2.0000 g | Freq: Once | INTRAVENOUS | Status: AC
  Administered 2020-11-21: 2 g via INTRAVENOUS
  Filled 2020-11-21: qty 50

## 2020-11-21 MED ORDER — SODIUM CHLORIDE 0.9 % IV SOLN: Freq: Once | INTRAVENOUS | Status: AC

## 2020-11-21 MED ORDER — DEXAMETHASONE 4 MG PO TABS
40.0000 mg | ORAL_TABLET | Freq: Once | ORAL | Status: AC
  Administered 2020-11-21: 40 mg via ORAL
  Filled 2020-11-21: qty 10

## 2020-11-21 MED ORDER — BORTEZOMIB CHEMO SQ INJECTION 3.5 MG (2.5MG/ML)
1.3000 mg/m2 | Freq: Once | INTRAMUSCULAR | Status: AC
  Administered 2020-11-21: 2.5 mg via SUBCUTANEOUS
  Filled 2020-11-21: qty 1

## 2020-11-21 MED ORDER — DIPHENHYDRAMINE HCL 25 MG PO CAPS
50.0000 mg | ORAL_CAPSULE | Freq: Once | ORAL | Status: AC
  Administered 2020-11-21: 50 mg via ORAL
  Filled 2020-11-21: qty 2

## 2020-11-21 MED ORDER — ACETAMINOPHEN 325 MG PO TABS
650.0000 mg | ORAL_TABLET | Freq: Once | ORAL | Status: AC
  Administered 2020-11-21: 650 mg via ORAL
  Filled 2020-11-21: qty 2

## 2020-11-21 MED ORDER — DARATUMUMAB-HYALURONIDASE-FIHJ 1800-30000 MG-UT/15ML ~~LOC~~ SOLN
1800.0000 mg | Freq: Once | SUBCUTANEOUS | Status: AC
  Administered 2020-11-21: 1800 mg via SUBCUTANEOUS
  Filled 2020-11-21: qty 15

## 2020-11-21 NOTE — Progress Notes (Signed)
Paul Norton, [place] [address]   CLINIC:  Medical Oncology/Hematology  PCP:  Carlena Hurl, PA-C [address] / [place] [address] [phone]   REASON FOR VISIT:  Follow-up for multiple myeloma  PRIOR THERAPY: None  NGS Results: Not done  CURRENT THERAPY: DaraBorD every 3 weeks; Revlimid 15 mg 2/3 weeks  BRIEF ONCOLOGIC HISTORY:  Oncology History  Multiple myeloma not having achieved remission ([facility])  11/01/2020 Initial Diagnosis   Multiple myeloma not having achieved remission ([facility])   11/02/2020 - 11/02/2020 Chemotherapy         11/21/2020 -  Chemotherapy    Patient is on Treatment Plan: MYELOMA NEWLY DIAGNOSED TRANSPLANT CANDIDATE DARAVRD (DARATUMUMAB SQ) Q21D X 6 CYCLES (INDUCTION/CONSOLIDATION)        CANCER STAGING: Cancer Staging No matching staging information was found for the patient.  INTERVAL HISTORY:  [name], a 65 y.o. male, returns for routine follow-up and consideration for first cycle of chemotherapy. Paul Norton was last seen on 11/10/2020.  Due for initiating cycle #1 of DaraBorD today.   Overall, he tells me he has been feeling okay. He complains of having sores and ulcers in his mouth and his appetite is slightly decreased due to a metallic taste. He is drinking 1 can of Ensure daily. He is still taking amoxicillin for 3 more days and denies having N/V/D. He denies having any new pains; he denies having any chronic pains. He is supposed to receive his Revlimid today.  He is staying in [place] with his family. He is trying to walk daily and not stay sedentary. He has never smoked. 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  Overall, he feels ready for first cycle of chemo today.    REVIEW OF SYSTEMS:  Review of Systems  Constitutional: Positive  for appetite change (75%) and fatigue.  HENT:   Positive for mouth sores (ulcers).   Respiratory: Positive for shortness of breath.   Cardiovascular: Positive for leg swelling (feet).  Gastrointestinal: Negative for diarrhea, nausea and vomiting.  Musculoskeletal: Negative for arthralgias and myalgias.  Psychiatric/Behavioral: Positive for sleep disturbance.  All other systems reviewed and are negative.   PAST MEDICAL/SURGICAL HISTORY:  Past Medical History:  Diagnosis Date  . Broken leg   . Colonoscopy refused 09/2017  . Hyperlipidemia   . Hypertension   . Multiple myeloma ([facility])   . Pneumonia   . Refuses treatment 09/2017   refuses screenings such as cancer screening, refuses vaccines, refuses normal preventative care  . Vaccine refused by patient    all vaccines as of 09/2017   Past Surgical History:  Procedure Laterality Date  . COLONOSCOPY     never, declines as of 09/2017    SOCIAL HISTORY:  Social History   Socioeconomic History  . Marital status: Divorced    Spouse name: Not on file  . Number of children: 1  . Years of education: Not on file  . Highest education level: Not on file  Occupational History  . Occupation: Disability  Tobacco Use  . Smoking status: Never Smoker  . Smokeless tobacco: Never Used  Substance and Sexual Activity  . Alcohol use: No  . Drug use: Never  . Sexual activity: Not Currently  Other Topics Concern  . Not on file  Social History Narrative  .  Not on file   Social Determinants of Health   Financial Resource Strain: Medium Risk  . Difficulty of Paying Living Expenses: Somewhat hard  Food Insecurity: No Food Insecurity  . Worried About Charity fundraiser in the Last Year: Never true  . Ran Out of Food in the Last Year: Never true  Transportation Needs: No Transportation Needs  . Lack of Transportation (Medical): No  . Lack of Transportation (Non-Medical): No  Physical Activity: Insufficiently Active  . Days of Exercise per  Week: 5 days  . Minutes of Exercise per Session: 20 min  Stress: No Stress Concern Present  . Feeling of Stress : Not at all  Social Connections: Socially Isolated  . Frequency of Communication with Friends and Family: More than three times a week  . Frequency of Social Gatherings with Friends and Family: More than three times a week  . Attends Religious Services: Never  . Active Member of Clubs or Organizations: No  . Attends Archivist Meetings: Never  . Marital Status: Divorced  Human resources officer Violence: Not At Risk  . Fear of Current or Ex-Partner: No  . Emotionally Abused: No  . Physically Abused: No  . Sexually Abused: No    FAMILY HISTORY:  History reviewed. No pertinent family history.  CURRENT MEDICATIONS:  Current Outpatient Medications  Medication Sig Dispense Refill  . acetaminophen (TYLENOL) 325 MG tablet Take 2 tablets (650 mg total) by mouth every 6 (six) hours as needed for mild pain (or Fever >/= 101). 30 tablet 30  . amoxicillin-clavulanate (AUGMENTIN) 875-125 MG tablet Take 1 tablet by mouth 2 (two) times daily for 7 days. 14 tablet 0  . calcium-vitamin D (OSCAL WITH D) 500-200 MG-UNIT tablet Take 1 tablet by mouth 2 (two) times daily. 60 tablet 0  . feeding supplement (ENSURE ENLIVE / ENSURE PLUS) LIQD Take 237 mLs by mouth 3 (three) times daily between meals. (Patient taking differently: Take 237 mLs by mouth daily.) 237 mL 12  . amLODipine (NORVASC) 10 MG tablet Take 1 tablet (10 mg total) by mouth daily. (Patient not taking: No sig reported) 90 tablet 3  . fluconazole (DIFLUCAN) 100 MG tablet Take 1 tablet (100 mg total) by mouth daily. (Patient not taking: Reported on 11/21/2020) 7 tablet 0  . hydrocortisone (ANUSOL-HC) 25 MG suppository Place 1 suppository (25 mg total) rectally 2 (two) times daily. (Patient not taking: No sig reported) 12 suppository 0  . Ipratropium-Albuterol (COMBIVENT RESPIMAT) 20-100 MCG/ACT AERS respimat Inhale 1 puff into the  lungs every 6 (six) hours as needed for wheezing or shortness of breath. (Patient not taking: Reported on 11/21/2020) 4 g 2  . lenalidomide (REVLIMID) 15 MG capsule Take 1 capsule (15 mg total) by mouth daily. 14 days on, 7 days off (Patient not taking: No sig reported) 14 capsule 0  . metoprolol tartrate (LOPRESSOR) 100 MG tablet Take 1 tablet (100 mg total) by mouth 2 (two) times daily. (Patient not taking: No sig reported) 180 tablet 3   No current facility-administered medications for this visit.    ALLERGIES:  No Known Allergies  PHYSICAL EXAM:  Performance status (ECOG): 1 - Symptomatic but completely ambulatory  Vitals:   11/21/20 0908  BP: 136/68  Pulse: (!) 101  Resp: 20  Temp: 97.8 F (36.6 C)  SpO2: 92%   Wt Readings from Last 3 Encounters:  11/21/20 152 lb 1.9 oz (69 kg)  11/15/20 154 lb 12.2 oz (70.2 kg)  11/14/20 148 lb 6.4  oz (67.3 kg)   Physical Exam Vitals reviewed.  Constitutional:      Appearance: Normal appearance.  HENT:     Mouth/Throat:     Lips: No lesions.     Mouth: No oral lesions.     Dentition: No gum lesions.     Tongue: Lesions (1 cm aphthous ulcer on L lateral edge) present.  Cardiovascular:     Rate and Rhythm: Normal rate and regular rhythm.     Pulses: Normal pulses.     Heart sounds: Normal heart sounds.  Pulmonary:     Effort: Pulmonary effort is normal.     Breath sounds: Normal breath sounds.  Abdominal:     Palpations: Abdomen is soft. There is no hepatomegaly, splenomegaly or mass.     Tenderness: There is no abdominal tenderness.     Hernia: No hernia is present.  Musculoskeletal:     Right lower leg: Edema (1+) present.     Left lower leg: Edema (1+) present.  Lymphadenopathy:     Lower Body: No right inguinal adenopathy. No left inguinal adenopathy.  Neurological:     General: No focal deficit present.     Mental Status: He is alert and oriented to person, place, and time.  Psychiatric:        Mood and Affect: Mood  normal.        Behavior: Behavior normal.     LABORATORY DATA:  I have reviewed the labs as listed.  CBC Latest Ref Rng & Units 11/21/2020 11/16/2020 11/16/2020  WBC 4.0 - 10.5 K/uL 14.1(H) - 3.6(L)  Hemoglobin 13.0 - 17.0 g/dL 7.6(L) 7.4(L) 7.0(L)  Hematocrit 39.0 - 52.0 % 24.1(L) 22.7(L) 21.2(L)  Platelets 150 - 400 K/uL 262 - 134(L)   CMP Latest Ref Rng & Units 11/21/2020 11/16/2020 11/15/2020  Glucose 70 - 99 mg/dL 106(H) 104(H) 119(H)  BUN 8 - 23 mg/dL 12 21 30(H)  Creatinine 0.61 - 1.24 mg/dL 1.06 1.17 1.67(H)  Sodium 135 - 145 mmol/L 131(L) 131(L) 126(L)  Potassium 3.5 - 5.1 mmol/L 4.0 3.0(L) 2.5(LL)  Chloride 98 - 111 mmol/L 99 104 97(L)  CO2 22 - 32 mmol/L _0 Calcium 8.9 - 10.3 mg/dL 9.3 7.0(L) 6.8(L)  Total Protein 6.5 - 8.1 g/dL 9.9(H) 8.3(H) 10.0(H)  Total Bilirubin 0.3 - 1.2 mg/dL 0.7 0.7 0.9  Alkaline Phos 38 - 126 U/L 58 48 58  AST 15 - 41 U/L 27 27 36  ALT 0 - 44 U/L 24 28 34    DIAGNOSTIC IMAGING:  I have independently reviewed the scans and discussed with the patient. CT ABDOMEN PELVIS WO CONTRAST  Result Date: 10/30/2020 CLINICAL DATA:  Cancer of unknown primary, multiple lytic lesions in the calvaria noted incidentally. Dizziness, unintended weight loss. EXAM: CT CHEST, ABDOMEN AND PELVIS WITHOUT CONTRAST TECHNIQUE: Multidetector CT imaging of the chest, abdomen and pelvis was performed following the standard protocol without IV contrast. COMPARISON:  CT head 10/29/2020, chest radiograph 10/29/2020 FINDINGS: CT CHEST FINDINGS Cardiovascular: Cardiomegaly. Coronary artery calcifications. Hypoattenuation of the cardiac blood pool compatible with anemia. The aortic root is suboptimally assessed given cardiac pulsation artifact. Atherosclerotic plaque within the normal caliber aorta. Shared origin of the brachiocephalic and left common carotid arteries. Proximal great vessels are otherwise unremarkable. Central pulmonary arteries are top-normal caliber. No large central  filling defects within limitations of a nonenhanced exam. Mediastinum/Nodes: No mediastinal fluid or gas. Normal thyroid gland and thoracic inlet. No acute abnormality of the trachea or esophagus.  No worrisome mediastinal or axillary adenopathy. Hilar nodal evaluation is limited in the absence of intravenous contrast media. Lungs/Pleura: No consolidation, features of edema, pneumothorax, or effusion. No suspicious pulmonary nodules or masses. Some dependent atelectasis. Evaluation slightly limited by mild respiratory motion artifact. Musculoskeletal: Widespread extensive lytic mottling throughout the axial and appendicular skeleton. Multiple rib fractures are seen in various stages of healing many of which are presumed to be pathologic in nature. Severe degenerative changes are present in the bilateral shoulders including larger heterotopic ossifications seen in the left shoulder recess. Bridging syndesmophytes across much of the thoracic levels and partial bridging across the spinous processes, could reflect some underlying spondyloarthropathy. CT ABDOMEN PELVIS FINDINGS Hepatobiliary: No visible focal liver lesion with limitations of an unenhanced CT. Smooth surface contour. Normal hepatic attenuation. Normal gallbladder and biliary tree without visible calcified gallstone. Pancreas: Mild pancreatic atrophy. No pancreatic ductal dilatation or surrounding inflammatory changes. Spleen: Splenomegaly within enlarged, lobular spleen. No visible focal splenic lesion within the limitations of this unenhanced exam. Adrenals/Urinary Tract: No adrenal mass or hemorrhage. Slight under rotation of the bilateral kidneys, anatomic variant. No visible or contour deforming renal lesion. No urolithiasis or hydronephrosis. Urinary bladder is largely decompressed at the time of exam and therefore poorly evaluated by CT imaging. Mild circumferential bladder wall thickening may be related underdistention or chronic outlet obstruction  given marked indentation of bladder base by an enlarged prostate. Stomach/Bowel: Distal esophagus, stomach and duodenum are unremarkable. No small bowel thickening or dilatation. Normal appendix in the right lower quadrant. Extensive distal colonic diverticulosis without active acute inflammation to suggest active diverticulitis. Some mild mural thickening in the sigmoid may be reflective of prior inflammation though warrants further evaluation with outpatient colonoscopy if not recently performed. Vascular/Lymphatic: Atherosclerotic calcifications within the abdominal aorta and branch vessels. No aneurysm or ectasia. No enlarged abdominopelvic lymph nodes. Reproductive: Enlarged prostate with few typically benign punctate calcifications. Trace right hydrocele. Other: No abdominopelvic free air or fluid. No bowel containing hernia. Musculoskeletal: Extensive lytic foci and bony remodeling throughout the axial and appendicular skeleton of the abdomen and pelvis including the proximal femora as well. Bony ankylosis of the L4-L5 and lumbosacral junction as well as across the bilateral SI joints. IMPRESSION: 1. Extensive lytic foci and bony mottling throughout the axial and appendicular skeleton of the chest, abdomen and pelvis including the proximal femora and humeri as well as multiple bilateral rib fractures in various stages of healing many of which are presumed to be pathologic in nature. Findings are concerning for a diffuse osseous metastatic disease versus multiple myeloma. 2. Mild mural thickening in the sigmoid may be reflective of prior inflammation given the numerous colonic diverticula within the segment though warrants further evaluation with outpatient colonoscopy if not recently performed. 3. Splenomegaly within enlarged, lobular spleen, nonspecific but can be seen in the setting of myeloproliferative disorder. 4. Cardiomegaly and coronary artery calcifications. Hypoattenuation of the cardiac blood pool  suggestive of anemia. 5. Trace right hydrocele. 6. Mild circumferential bladder wall thickening may be related underdistention or chronic outlet obstruction given marked indentation of bladder base by an enlarged prostate. Could correlate with urinalysis to exclude cystitis. 7. Prostatomegaly. 8. Aortic Atherosclerosis (ICD10-I70.0). Electronically Signed   By: Lovena Le M.D.   On: 10/30/2020 01:15   CT Head Wo Contrast  Result Date: 10/29/2020 CLINICAL DATA:  Dizziness, carbon monoxide exposure 2 months prior with COVID few weeks ago. Unintended weight loss. EXAM: CT HEAD WITHOUT CONTRAST TECHNIQUE: Contiguous axial images were  obtained from the base of the skull through the vertex without intravenous contrast. COMPARISON:  None. FINDINGS: Brain: No evidence of acute infarction, hemorrhage, hydrocephalus, extra-axial collection, visible mass lesion or mass effect. Symmetric prominence of the ventricles, cisterns and sulci compatible with parenchymal volume loss. Patchy areas of white matter hypoattenuation are most compatible with chronic microvascular angiopathy. Vascular: Atherosclerotic calcification of the carotid siphons. No hyperdense vessel. Skull: Mottled appearance of the bone particularly towards the clivus and sphenoid bones with multiple sites of lytic lesions, largest in the posterior left parietal bone measuring 19 x 8 mm (3/42) a slightly more expansile lytic focus is also seen in the right temporal bone measuring 15 x 7 mm (3/19) and in the right mastoid measuring 11 x 12 mm (3/9). Additional scattered foci elsewhere. No calvarial fracture. No scalp swelling or hematoma. Sinuses/Orbits: Nodular mural thickening in the paranasal sinuses, predominantly in the maxillary sinuses. Mastoid air cells are predominantly clear. Middle ear cavities are clear. Other: None IMPRESSION: 1. Mottled appearance of the calvaria and skull base with multitude of lytic lesions concerning for metastatic disease or  myeloma. 2. No acute intracranial abnormality is seen within the limitations of an unenhanced CT. Background of chronic microvascular angiopathy and parenchymal volume loss. These results were called by telephone at the time of interpretation on 10/29/2020 at 10:54 pm to provider [facility] ZAMMIT , who verbally acknowledged these results. Electronically Signed   By: Lovena Le M.D.   On: 10/29/2020 22:56   CT Chest Wo Contrast  Result Date: 10/30/2020 CLINICAL DATA:  Cancer of unknown primary, multiple lytic lesions in the calvaria noted incidentally. Dizziness, unintended weight loss. EXAM: CT CHEST, ABDOMEN AND PELVIS WITHOUT CONTRAST TECHNIQUE: Multidetector CT imaging of the chest, abdomen and pelvis was performed following the standard protocol without IV contrast. COMPARISON:  CT head 10/29/2020, chest radiograph 10/29/2020 FINDINGS: CT CHEST FINDINGS Cardiovascular: Cardiomegaly. Coronary artery calcifications. Hypoattenuation of the cardiac blood pool compatible with anemia. The aortic root is suboptimally assessed given cardiac pulsation artifact. Atherosclerotic plaque within the normal caliber aorta. Shared origin of the brachiocephalic and left common carotid arteries. Proximal great vessels are otherwise unremarkable. Central pulmonary arteries are top-normal caliber. No large central filling defects within limitations of a nonenhanced exam. Mediastinum/Nodes: No mediastinal fluid or gas. Normal thyroid gland and thoracic inlet. No acute abnormality of the trachea or esophagus. No worrisome mediastinal or axillary adenopathy. Hilar nodal evaluation is limited in the absence of intravenous contrast media. Lungs/Pleura: No consolidation, features of edema, pneumothorax, or effusion. No suspicious pulmonary nodules or masses. Some dependent atelectasis. Evaluation slightly limited by mild respiratory motion artifact. Musculoskeletal: Widespread extensive lytic mottling throughout the axial and  appendicular skeleton. Multiple rib fractures are seen in various stages of healing many of which are presumed to be pathologic in nature. Severe degenerative changes are present in the bilateral shoulders including larger heterotopic ossifications seen in the left shoulder recess. Bridging syndesmophytes across much of the thoracic levels and partial bridging across the spinous processes, could reflect some underlying spondyloarthropathy. CT ABDOMEN PELVIS FINDINGS Hepatobiliary: No visible focal liver lesion with limitations of an unenhanced CT. Smooth surface contour. Normal hepatic attenuation. Normal gallbladder and biliary tree without visible calcified gallstone. Pancreas: Mild pancreatic atrophy. No pancreatic ductal dilatation or surrounding inflammatory changes. Spleen: Splenomegaly within enlarged, lobular spleen. No visible focal splenic lesion within the limitations of this unenhanced exam. Adrenals/Urinary Tract: No adrenal mass or hemorrhage. Slight under rotation of the bilateral kidneys, anatomic variant. No  visible or contour deforming renal lesion. No urolithiasis or hydronephrosis. Urinary bladder is largely decompressed at the time of exam and therefore poorly evaluated by CT imaging. Mild circumferential bladder wall thickening may be related underdistention or chronic outlet obstruction given marked indentation of bladder base by an enlarged prostate. Stomach/Bowel: Distal esophagus, stomach and duodenum are unremarkable. No small bowel thickening or dilatation. Normal appendix in the right lower quadrant. Extensive distal colonic diverticulosis without active acute inflammation to suggest active diverticulitis. Some mild mural thickening in the sigmoid may be reflective of prior inflammation though warrants further evaluation with outpatient colonoscopy if not recently performed. Vascular/Lymphatic: Atherosclerotic calcifications within the abdominal aorta and branch vessels. No aneurysm or  ectasia. No enlarged abdominopelvic lymph nodes. Reproductive: Enlarged prostate with few typically benign punctate calcifications. Trace right hydrocele. Other: No abdominopelvic free air or fluid. No bowel containing hernia. Musculoskeletal: Extensive lytic foci and bony remodeling throughout the axial and appendicular skeleton of the abdomen and pelvis including the proximal femora as well. Bony ankylosis of the L4-L5 and lumbosacral junction as well as across the bilateral SI joints. IMPRESSION: 1. Extensive lytic foci and bony mottling throughout the axial and appendicular skeleton of the chest, abdomen and pelvis including the proximal femora and humeri as well as multiple bilateral rib fractures in various stages of healing many of which are presumed to be pathologic in nature. Findings are concerning for a diffuse osseous metastatic disease versus multiple myeloma. 2. Mild mural thickening in the sigmoid may be reflective of prior inflammation given the numerous colonic diverticula within the segment though warrants further evaluation with outpatient colonoscopy if not recently performed. 3. Splenomegaly within enlarged, lobular spleen, nonspecific but can be seen in the setting of myeloproliferative disorder. 4. Cardiomegaly and coronary artery calcifications. Hypoattenuation of the cardiac blood pool suggestive of anemia. 5. Trace right hydrocele. 6. Mild circumferential bladder wall thickening may be related underdistention or chronic outlet obstruction given marked indentation of bladder base by an enlarged prostate. Could correlate with urinalysis to exclude cystitis. 7. Prostatomegaly. 8. Aortic Atherosclerosis (ICD10-I70.0). Electronically Signed   By: Lovena Le M.D.   On: 10/30/2020 01:15   DG Chest Port 1 View  Result Date: 11/15/2020 CLINICAL DATA:  Shortness of breath with cough EXAM: PORTABLE CHEST 1 VIEW COMPARISON:  Chest radiograph October 31, 2020 and chest CT October 30, 2020 FINDINGS:  Increased opacity in the left base is concerning for pneumonia. There is underlying interstitial thickening throughout the lungs. The heart size and pulmonary vascularity are normal. No adenopathy. Lytic bone lesions are seen throughout the chest. IMPRESSION: Left base opacity concerning for acute pneumonia. Underlying probable degree of bronchitis. Heart size normal. Widespread lytic bony lesions, an appearance that may well be indicative of multiple myeloma. Widespread metastatic disease could also present in this manner. Electronically Signed   By: Lowella Grip III M.D.   On: 11/15/2020 17:22   DG Chest Port 1 View  Result Date: 10/31/2020 CLINICAL DATA:  Altered mental status EXAM: PORTABLE CHEST 1 VIEW COMPARISON:  10/29/2020 FINDINGS: New elevation of the right hemidiaphragm with right basilar pleuroparenchymal process. Chronic interstitial changes. No pneumothorax. Similar cardiomediastinal contours. IMPRESSION: New elevation of the right hemidiaphragm with right basilar atelectasis/consolidation and possible pleural effusion. Electronically Signed   By: Macy Mis M.D.   On: 10/31/2020 11:44   DG Chest Port 1 View  Result Date: 10/29/2020 CLINICAL DATA:  Weakness EXAM: PORTABLE CHEST 1 VIEW COMPARISON:  None. FINDINGS: Cardiac shadow is enlarged.  Lungs are well aerated bilaterally. Mild vascular congestion is noted without interstitial edema. No focal infiltrate or sizable effusion is seen. Degenerative changes of the shoulder joints and thoracic spine are noted. IMPRESSION: Mild vascular congestion without acute infiltrate or edema. Electronically Signed   By: Inez Catalina M.D.   On: 10/29/2020 23:07   CT BONE MARROW BIOPSY  Addendum Date: 11/14/2020   ADDENDUM REPORT: 11/14/2020 14:18 ADDENDUM: In the third paragraph of the procedure section below, the report reads " The 11 gauge coaxial bone biopsy needle was re-advanced into a slightly different location within the LEFT iliac marrow  space, positioning was confirmed with CT imaging and an additional bone marrow biopsy was obtained." The report should read "the 11 gauge coaxial bone needle was readvanced into a slightly different location within the RIGHT iliac marrow space, positioning was confirmed with CT imaging and additional bone marrow biopsy was obtained. And quotations Ruthann Cancer, MD [facility] Electronically Signed   By: Ruthann Cancer MD   On: 11/14/2020 14:18   Result Date: 11/14/2020 INDICATION: 65 year old male with history of multiple myeloma. EXAM: CT-GUIDED BONE MARROW BIOPSY AND ASPIRATION MEDICATIONS: None ANESTHESIA/SEDATION: Fentanyl 37.5 mcg IV; Versed 1 mg IV Sedation Time: 16 minutes; The patient was continuously monitored during the procedure by the interventional radiology nurse under my direct supervision. COMPLICATIONS: None immediate. PROCEDURE: Informed consent was obtained from the patient following an explanation of the procedure, risks, benefits and alternatives. The patient understands, agrees and consents for the procedure. All questions were addressed. A time out was performed prior to the initiation of the procedure. The patient was positioned prone and non-contrast localization CT was performed of the pelvis to demonstrate the iliac marrow spaces. The operative site was prepped and draped in the usual sterile fashion. Under sterile conditions and local anesthesia, a 22 gauge spinal needle was utilized for procedural planning. Next, an 11 gauge coaxial bone biopsy needle was advanced into the right iliac marrow space. Needle position was confirmed with CT imaging. Initially, a bone marrow aspiration was performed. Next, a bone marrow biopsy was obtained with the 11 gauge outer bone marrow device. The 11 gauge coaxial bone biopsy needle was re-advanced into a slightly different location within the left iliac marrow space, positioning was confirmed  with CT imaging and an additional bone marrow biopsy was obtained. Samples were prepared with the cytotechnologist and deemed adequate. The needle was removed and superficial hemostasis was obtained with manual compression. A dressing was applied. The patient tolerated the procedure well without immediate post procedural complication. IMPRESSION: Successful CT guided right iliac bone marrow aspiration and core biopsy. Ruthann Cancer, MD [facility] Radiology Electronically Signed: By: Ruthann Cancer MD On: 11/01/2020 10:52   ECHOCARDIOGRAM COMPLETE  Result Date: 10/30/2020    ECHOCARDIOGRAM REPORT   Patient Name:   KEADEN GUNNOE Date of Exam: 10/30/2020 Medical Rec #:  [id number]             Height:       69.0 in Accession #:    [id number]            Weight:       151.9 lb Date of Birth:  06/24/1956              BSA:          1.838 m Patient Age:    64 years  BP:           129/58 mmHg Patient Gender: M                     HR:           83 bpm. Exam Location:  [facility] Procedure: 2D Echo, Cardiac Doppler and Color Doppler Indications:    Elevated Troponin  History:        Patient has no prior history of Echocardiogram examinations.                 Risk Factors:Hypertension and Dyslipidemia. COVID-19 virus                 infection, Hypercalcemia, Moderate protein-calorie malnutrition,                 AKI (acute kidney injury) ([place]).  Sonographer:    Alvino Chapel RCS Referring Phys: [facility]  1. Left ventricular ejection fraction, by estimation, is 60 to 65%. The left ventricle has normal function. The left ventricle has no regional wall motion abnormalities. Left ventricular diastolic parameters are indeterminate. Elevated left atrial pressure.  2. Right ventricular systolic function is mildly reduced. The right ventricular size is moderately enlarged. There is moderately elevated pulmonary artery systolic pressure.  3.  Left atrial size was severely dilated.  4. Right atrial size was severely dilated.  5. The mitral valve is normal in structure. Mild mitral valve regurgitation. No evidence of mitral stenosis.  6. The aortic valve is tricuspid. Aortic valve regurgitation is not visualized. No aortic stenosis is present.  7. The inferior vena cava is dilated in size with <50% respiratory variability, suggesting right atrial pressure of 15 mmHg. FINDINGS  Left Ventricle: Left ventricular ejection fraction, by estimation, is 60 to 65%. The left ventricle has normal function. The left ventricle has no regional wall motion abnormalities. The left ventricular internal cavity size was normal in size. There is  no left ventricular hypertrophy. Left ventricular diastolic parameters are indeterminate. Elevated left atrial pressure. Right Ventricle: The right ventricular size is moderately enlarged. Right vetricular wall thickness was not assessed. Right ventricular systolic function is mildly reduced. There is moderately elevated pulmonary artery systolic pressure. The tricuspid regurgitant velocity is 2.99 m/s, and with an assumed right atrial pressure of 15 mmHg, the estimated right ventricular systolic pressure is 77.9 mmHg. Left Atrium: Left atrial size was severely dilated. Right Atrium: Right atrial size was severely dilated. Pericardium: There is no evidence of pericardial effusion. Mitral Valve: The mitral valve is normal in structure. Mild mitral valve regurgitation. No evidence of mitral valve stenosis. Tricuspid Valve: The tricuspid valve is normal in structure. Tricuspid valve regurgitation is mild . No evidence of tricuspid stenosis. Aortic Valve: The aortic valve is tricuspid. Aortic valve regurgitation is not visualized. No aortic stenosis is present. Aortic valve mean gradient measures 5.8 mmHg. Aortic valve peak gradient measures 12.0 mmHg. Aortic valve area, by VTI measures 3.01  cm. Pulmonic Valve: The pulmonic valve was  not well visualized. Pulmonic valve regurgitation is not visualized. No evidence of pulmonic stenosis. Aorta: The aortic root is normal in size and structure. Pulmonary Artery: Moderate pulmonary HTN, PASP is 51 mmHg. Venous: The inferior vena cava is dilated in size with less than 50% respiratory variability, suggesting right atrial pressure of 15 mmHg. IAS/Shunts: No atrial level shunt detected by color flow Doppler.  LEFT VENTRICLE PLAX 2D LVIDd:  5.20 cm  Diastology LVIDs:         3.50 cm  LV e' medial:    6.20 cm/s LV PW:         0.90 cm  LV E/e' medial:  23.2 LV IVS:        1.10 cm  LV e' lateral:   13.80 cm/s LVOT diam:     2.20 cm  LV E/e' lateral: 10.4 LV SV:         98 LV SV Index:   53 LVOT Area:     3.80 cm  RIGHT VENTRICLE RV S prime:     15.90 cm/s TAPSE (M-mode): 2.3 cm LEFT ATRIUM              Index       RIGHT ATRIUM           Index LA diam:        4.90 cm  2.67 cm/m  RA Area:     27.80 cm LA Vol (A2C):   141.0 ml 76.71 ml/m RA Volume:   96.80 ml  52.67 ml/m LA Vol (A4C):   128.0 ml 69.64 ml/m LA Biplane Vol: 138.0 ml 75.08 ml/m  AORTIC VALVE AV Area (Vmax):    2.79 cm AV Area (Vmean):   2.74 cm AV Area (VTI):     3.01 cm AV Vmax:           172.87 cm/s AV Vmean:          112.345 cm/s AV VTI:            0.324 m AV Peak Grad:      12.0 mmHg AV Mean Grad:      5.8 mmHg LVOT Vmax:         127.00 cm/s LVOT Vmean:        80.900 cm/s LVOT VTI:          0.257 m LVOT/AV VTI ratio: 0.79  AORTA Ao Root diam: 3.00 cm MITRAL VALVE                 TRICUSPID VALVE MV Area (PHT): 4.54 cm      TR Peak grad:   35.8 mmHg MV Decel Time: 167 msec      TR Vmax:        299.00 cm/s MR Peak grad:    94.9 mmHg MR Mean grad:    62.0 mmHg   SHUNTS MR Vmax:         487.00 cm/s Systemic VTI:  0.26 m MR Vmean:        371.0 cm/s  Systemic Diam: 2.20 cm MR PISA:         2.26 cm MR PISA Eff ROA: 15 mm MR PISA Radius:  0.60 cm MV E velocity: 144.00 cm/s MV A velocity: 45.90 cm/s MV E/A ratio:  3.14 Carlyle Dolly  MD Electronically signed by Carlyle Dolly MD Signature Date/Time: 10/30/2020/12:07:16 PM    Final      ASSESSMENT:  1.  IgG kappa light chain multiple myeloma: - Admission with hypercalcemia and renal failure. - CT CAP showed extensive lytic foci throughout the axial and appendicular skeleton of the chest, abdomen and pelvis.  Splenomegaly with an enlarged lobular spleen, nonspecific. - Bone marrow biopsy on 11/01/2020 shows 85% atypical plasma cells in the aspirate. - FISH panel positive for 1 p-,-13,14q-,16q-,17p-/-17 and 20q- - Cytogenetics-no metaphases available for analysis. - SPEP with 5.4 g of M spike.  Free light chain  ratio 1030.  LDH normal.  Beta-2 microglobulin 18.7.  24-hour urine total protein 3.6 g.  2.  Social/family history: - He worked as a Administrator.  He lives by himself. - Mother with breast and ovarian cancer.  Father had metastatic kidney cancer.  His brother's daughter has CML.    PLAN:  1.  Stage III IgG kappa light chain multiple myeloma: - We have treated him with dexamethasone and cyclophosphamide on 11/02/2020 while he was inpatient. - We discussed bone marrow biopsy results in detail. - We have also discussed high risk disease based on myeloma FISH panel. - We will start him on Dara with VRD based on [facility] protocol. - We will start him on Revlimid at low-dose of 15 mg 2 weeks on 1 week off.  He will get his medication today and will start taking it once he gets home. - We will start him on subcu Darzalex and Velcade today.  He will receive 40 mg of dexamethasone in our office. - We have discussed side effects in detail. - Because of high risk disease, I will make a referral to bone marrow transplant center once his insurance details are sorted out. - I reviewed labs from today which showed creatinine is 1.06 and calcium of 9.3. - Hemoglobin is 7.6.  White count is elevated at 14.1 and platelets are normal. - We will reevaluate him in 1 week.  2.   Hypomagnesemia: - Magnesium today is 1.5 g.  He will receive IV magnesium.  If it continues to be low will consider oral magnesium.  3.  Elevated carbon monoxide levels: - He was reportedly found to have elevated carbon monoxide level while he was hospitalized.  He is a non-smoker. - We will plan to repeat carbon monoxide level.   Orders placed this encounter:  Orders Placed This Encounter  Procedures  . Carbon Monoxide, Blood   Total time spent 40 minutes with more than 50% of the time spent face-to-face discussing new diagnosis, treatment plan, side effects, counseling and coordination of care.  Derek Jack, MD [place] [phone]   I, Milinda Antis, am acting as a scribe for Dr. Sanda Linger.  I, Derek Jack MD, have reviewed the above documentation for accuracy and completeness, and I agree with the above.

## 2020-11-21 NOTE — Progress Notes (Signed)
Pt here for D1C1 of velcade and dara.  Hemoglobin 7.6. magnesium 1.5.  Give 2 grams of magnesium with treatment today.  Okay for treatment per Dr Irineo Axon with sister in law about taking aspirin 81 mg daily.  Has been added to medication list.  Tolerated treatment well today without incidence.  Stable during and after treatment.  Vial signs stable prior to discharge.  2hr wait time completed.  Discharged in stable condition via wheelchair.

## 2020-11-21 NOTE — Patient Instructions (Signed)
[facility]  Discharge Instructions: Thank you for choosing [facility] to provide your oncology and hematology care.  If you have a lab appointment with the [facility], please come in thru the Main Entrance and check in at the main information desk.  Wear comfortable clothing and clothing appropriate for easy access to any Portacath or PICC line.   We strive to give you quality time with your provider. You may need to reschedule your appointment if you arrive late (15 or more minutes).  Arriving late affects you and other patients whose appointments are after yours.  Also, if you miss three or more appointments without notifying the office, you may be dismissed from the clinic at the provider's discretion.      For prescription refill requests, have your pharmacy contact our office and allow 72 hours for refills to be completed.    Today you received the following chemotherapy and/or immunotherapy agents velcade and dara. Please start taking aspirin 81 mg daily. Please call the clinic if you have any questions or concerns.      To help prevent nausea and vomiting after your treatment, we encourage you to take your nausea medication as directed.  BELOW ARE SYMPTOMS THAT SHOULD BE REPORTED IMMEDIATELY: . *FEVER GREATER THAN 100.4 F (38 C) OR HIGHER . *CHILLS OR SWEATING . *NAUSEA AND VOMITING THAT IS NOT CONTROLLED WITH YOUR NAUSEA MEDICATION . *UNUSUAL SHORTNESS OF BREATH . *UNUSUAL BRUISING OR BLEEDING . *URINARY PROBLEMS (pain or burning when urinating, or frequent urination) . *BOWEL PROBLEMS (unusual diarrhea, constipation, pain near the anus) . TENDERNESS IN MOUTH AND THROAT WITH OR WITHOUT PRESENCE OF ULCERS (sore throat, sores in mouth, or a toothache) . UNUSUAL RASH, SWELLING OR PAIN  . UNUSUAL VAGINAL DISCHARGE OR ITCHING   Items with * indicate a potential emergency and should be followed up as soon as possible or go to the Emergency Department if  any problems should occur.  Please show the CHEMOTHERAPY ALERT CARD or IMMUNOTHERAPY ALERT CARD at check-in to the Emergency Department and triage nurse.  Should you have questions after your visit or need to cancel or reschedule your appointment, please contact [facility] [phone]  and follow the prompts.  Office hours are 8:00 a.m. to 4:30 p.m. Monday - Friday. Please note that voicemails left after 4:00 p.m. may not be returned until the following business day.  We are closed weekends and major holidays. You have access to a nurse at all times for urgent questions. Please call the main number to the clinic [phone] and follow the prompts.  For any non-urgent questions, you may also contact your provider using MyChart. We now offer e-Visits for anyone 74 and older to request care online for non-urgent symptoms. For details visit [email or web address].   Also download the MyChart app! Go to the app store, search "MyChart", open the app, select [facility], and log in with your MyChart username and password.  Due to Covid, a mask is required upon entering the hospital/clinic. If you do not have a mask, one will be given to you upon arrival. For doctor visits, patients may have 1 support person aged 20 or older with them. For treatment visits, patients cannot have anyone with them due to current Covid guidelines and our immunocompromised population.

## 2020-11-21 NOTE — Patient Instructions (Signed)
[place] at [facility] Discharge Instructions  You were seen today by Dr. Delton Coombes. He went over your recent results. You received your first treatment today. Side effects include fatigue, diarrhea or constipation. If you develop diarrhea, purchase Imodium over the counter and take 2 tablets with the first watery bowel movement, then take 1 tablet after every watery BM. If you develop constipation, purchase a stool softener over the counter and take 1 tablet daily. Start taking the Revlimid as soon as your receive it and take it for 2 weeks, then take 1 week off. Purchase Orajel over the counter and apply on your tongue ulcer. Dr. Delton Coombes will see you back in 1 week for labs and follow up.   Thank you for choosing [place] at [facility] to provide your oncology and hematology care.  To afford each patient quality time with our provider, please arrive at least 15 minutes before your scheduled appointment time.   If you have a lab appointment with the [place] please come in thru the Main Entrance and check in at the main information desk  You need to re-schedule your appointment should you arrive 10 or more minutes late.  We strive to give you quality time with our providers, and arriving late affects you and other patients whose appointments are after yours.  Also, if you no show three or more times for appointments you may be dismissed from the clinic at the providers discretion.     Again, thank you for choosing [facility].  Our hope is that these requests will decrease the amount of time that you wait before being seen by our physicians.       _____________________________________________________________  Should you have questions after your visit to [facility], please contact our office at [phone] between the hours of 8:00 a.m. and 4:30 p.m.  Voicemails left after 4:00 p.m. will not be returned until  the following business day.  For prescription refill requests, have your pharmacy contact our office and allow 72 hours.    Cancer Center Support Programs:   > Cancer Support Group  2nd Tuesday of the month 1pm-2pm, Journey Room

## 2020-11-21 NOTE — Progress Notes (Signed)
Patient assessed by Dr Delton Coombes.  No acute distress noted.  Labs reviewed and ok for treatment.

## 2020-11-22 ENCOUNTER — Telehealth (HOSPITAL_COMMUNITY): Payer: Self-pay | Admitting: Emergency Medicine

## 2020-11-22 NOTE — Telephone Encounter (Signed)
24 hr call back, pt doing well.  Has good appetite and has been out walking for exercise.

## 2020-11-23 ENCOUNTER — Encounter: Payer: Self-pay | Admitting: Medical

## 2020-11-23 ENCOUNTER — Ambulatory Visit: Payer: Commercial Managed Care - PPO | Admitting: Medical

## 2020-11-23 VITALS — BP 150/50 | HR 104 | Ht 71.0 in | Wt 153.8 lb

## 2020-11-23 DIAGNOSIS — C799 Secondary malignant neoplasm of unspecified site: Secondary | ICD-10-CM

## 2020-11-23 DIAGNOSIS — N179 Acute kidney failure, unspecified: Secondary | ICD-10-CM | POA: Diagnosis not present

## 2020-11-23 DIAGNOSIS — Z9189 Other specified personal risk factors, not elsewhere classified: Secondary | ICD-10-CM

## 2020-11-23 DIAGNOSIS — E440 Moderate protein-calorie malnutrition: Secondary | ICD-10-CM

## 2020-11-23 DIAGNOSIS — J9601 Acute respiratory failure with hypoxia: Secondary | ICD-10-CM | POA: Diagnosis not present

## 2020-11-23 DIAGNOSIS — Z91199 Patient's noncompliance with other medical treatment and regimen due to unspecified reason: Secondary | ICD-10-CM

## 2020-11-23 DIAGNOSIS — D649 Anemia, unspecified: Secondary | ICD-10-CM

## 2020-11-23 DIAGNOSIS — J189 Pneumonia, unspecified organism: Secondary | ICD-10-CM | POA: Diagnosis not present

## 2020-11-23 DIAGNOSIS — Z9119 Patient's noncompliance with other medical treatment and regimen: Secondary | ICD-10-CM

## 2020-11-23 DIAGNOSIS — Z9289 Personal history of other medical treatment: Secondary | ICD-10-CM

## 2020-11-23 DIAGNOSIS — Z8616 Personal history of COVID-19: Secondary | ICD-10-CM

## 2020-11-23 DIAGNOSIS — I10 Essential (primary) hypertension: Secondary | ICD-10-CM

## 2020-11-23 LAB — PRETREATMENT RBC PHENOTYPE

## 2020-11-23 NOTE — Patient Instructions (Addendum)
Recommendations:  Follow-up with your oncology doctor next week as planned  Continue to monitor blood pressures.  If you are staying <120/70 then you can hold the blood pressure medications  If blood pressures are greater than 130/80, then restart Metoprolol 100mg , 1/2 tablet twice daily.     STOP Amlodipine for now.  Continue activity as tolerated.  Avoid falls . Use hand rails when possible. If you feel unstable or need a cane, then let me know  Finish Augmentin antibiotic.  Your carbon monoxide test from oncology repeat is still pending  I recommend a repeat chest xray in 1 week.   This can be done here or [facility] in [place].   I recommend you use the Combivent inhaler twice daily or as labeled to help your breathing.  Do this for at least the next 10-14 days. You are still showing low oxygen.  If you feel worse breathing in the next few days then call back.

## 2020-11-23 NOTE — Progress Notes (Signed)
Subjective: Chief Complaint  Patient presents with  . Hospitalization Follow-up    Here for hospital follow-up.  Accompanied by no one in the room.  I saw him recently for hospital follow-up, but then the day after I saw him he got acutely ill and was re hospitalized for pneumonia.  So this is a hospital follow-up from that visit  He was hospitalized May 3 through Nov 16, 2020 with [facility]  Diagnoses included: Principal Problem:   Acute respiratory failure with hypoxia (HCC) Active Problems:   AKI (acute kidney injury) (HCC)   Hyponatremia   Hypokalemia   Severe protein-calorie malnutrition ([place]: less than 60% of standard weight) (HCC)   Community acquired pneumonia  Hospital discharge note showed that he completed a course of Augmentin, he was prescribed Combivent to help with wheezing and shortness of breath, otherwise advised to continue his medications and treatment for multiple myeloma  He just saw oncology 2 days ago for follow-up on multiple myeloma.  He notes that he is following their recommendations.  Sees oncology office for treatment again tomorrow.     CODE STATUS at the hospital shows DNR   He is a 65-year-old male with history of noncompliance, high blood pressure, hyperlipidemia with recent diagnosis of multiple myeloma with lytic lesions, recent dehydration and acute kidney injury from the hospitalization before last.  Since his health took a turn for the worst in recent weeks, his brother and sister-in-law have been trying to look out for him and take care of him.  He was on oxygen with this hospitalization and has been weaned off of the oxygen.  He was advised to complete the course of the Augmentin oral antibiotic.    At 1 point with this hospitalization he apparently wanted to go home although the hospitalist felt like he was not done with his complete work-up.  He was alert and oriented and had full decision-making capacity per hospital note and decided to  go home early.  Since the recent hospitalization been trying to re cooperate.   Doing some walking, some home exercises, chair exercise with some light weight dumbbells.  Breathing seems a little better each day.  Has 1 more day left of Augmentin antibiotic.  Not currently on oxygen.    He had covid infection a few months ago  08/2020 and was having ongoing taste changes but currently sees improvement in taste and smell since even last visit 11/15/20.  His family wants by testing for COVID antibodies since he had COVID symptoms a few months ago.  HTN - he notes home BPs continue to be in 120/50s.  He has not yet started back on blood pressure medication   Past Medical History:  Diagnosis Date  . Broken leg   . Colonoscopy refused 09/2017  . Hyperlipidemia   . Hypertension   . Multiple myeloma (HCC)   . Pneumonia   . Refuses treatment 09/2017   refuses screenings such as cancer screening, refuses vaccines, refuses normal preventative care  . Vaccine refused by patient    all vaccines as of 09/2017   Current Outpatient Medications on File Prior to Visit  Medication Sig Dispense Refill  . amoxicillin-clavulanate (AUGMENTIN) 875-125 MG tablet Take 1 tablet by mouth 2 (two) times daily for 7 days. 14 tablet 0  . aspirin 81 MG chewable tablet Chew by mouth daily.    . calcium-vitamin D (OSCAL WITH D) 500-200 MG-UNIT tablet Take 1 tablet by mouth 2 (two) times daily. 60 tablet   0  . feeding supplement (ENSURE ENLIVE / ENSURE PLUS) LIQD Take 237 mLs by mouth 3 (three) times daily between meals. 237 mL 12  . acetaminophen (TYLENOL) 325 MG tablet Take 2 tablets (650 mg total) by mouth every 6 (six) hours as needed for mild pain (or Fever >/= 101). (Patient not taking: Reported on 11/23/2020) 30 tablet 30  . amLODipine (NORVASC) 10 MG tablet Take 1 tablet (10 mg total) by mouth daily. (Patient not taking: No sig reported) 90 tablet 3  . fluconazole (DIFLUCAN) 100 MG tablet Take 1 tablet (100 mg total)  by mouth daily. (Patient not taking: No sig reported) 7 tablet 0  . hydrocortisone (ANUSOL-HC) 25 MG suppository Place 1 suppository (25 mg total) rectally 2 (two) times daily. (Patient not taking: No sig reported) 12 suppository 0  . Ipratropium-Albuterol (COMBIVENT RESPIMAT) 20-100 MCG/ACT AERS respimat Inhale 1 puff into the lungs every 6 (six) hours as needed for wheezing or shortness of breath. (Patient not taking: No sig reported) 4 g 2  . lenalidomide (REVLIMID) 15 MG capsule Take 1 capsule (15 mg total) by mouth daily. 14 days on, 7 days off (Patient not taking: No sig reported) 14 capsule 0  . metoprolol tartrate (LOPRESSOR) 100 MG tablet Take 1 tablet (100 mg total) by mouth 2 (two) times daily. (Patient not taking: No sig reported) 180 tablet 3   No current facility-administered medications on file prior to visit.   ROS as in subjective    Objective: BP (!) 150/50   Pulse (!) 104   Ht 5' 11" (1.803 m)   Wt 153 lb 12.8 oz (69.8 kg)   SpO2 (!) 89%   BMI 21.45 kg/m   Wt Readings from Last 3 Encounters:  11/23/20 153 lb 12.8 oz (69.8 kg)  11/21/20 152 lb 1.9 oz (69 kg)  11/15/20 154 lb 12.2 oz (70.2 kg)    Gen: nad.  White male Somewhat decreased sounds in lower lung fields otherwise lungs clear heart regular rate and rhythm, normal S1,S2 without murmurs No extremity edema 2+ pulses upper and lower extremities Somewhat dry mucous membranes, HEENT otherwise unremarkable He still looks malnourished and underweight Psych: Pleasant, answers questions appropriately Alert and oriented x3     Assessment: Encounter Diagnoses  Name Primary?  . Acute respiratory failure with hypoxia (HCC) Yes  . AKI (acute kidney injury) (HCC)   . Anemia, unspecified type   . Community acquired pneumonia, unspecified laterality   . Essential hypertension, benign   . History of COVID-19   . History of exposure to respiratory irritant   . History of recent hospitalization   .  Hypercalcemia   . Metastatic malignant neoplasm, unspecified site (HCC)   . Moderate protein-calorie malnutrition (HCC)   . Noncompliance      Plan I reviewed his recent discharge summary, medicines reconciled, I reviewed his imaging, labs and recommendations.  He seems to be somewhat improved from pneumonia hospitalization.  He has 1 more day left on antibiotics.  I asked him to repeat chest x-ray in 1 week.  I gave him a paper prescription to have this done if he does up in [place].  If not he can do it here in [place] as discussed  He still needs to significantly increase fluid intake  sob-I advised him to use the Combivent inhaler given by the hospital.  He is not using it currently despite not full lung sounds and still not back to normal with pulse ox.    HTN with hx/o white coat hypertension, but home readings continue to be in the 120/50s.  He will monitor BP and he has been advised to start back on Metoprolol first if BPs rise above 130/80.   For now he is holding both Amlodipine, Metoprolol.    History of carbon monoxide exposure-pending additional test from oncology  Low magnesium-recent IV magnesium through oncology  Multiple myeloma-I reviewed his oncology notes from 2 days ago.  They plan to see him back in 1 week.  He will receive IV magnesium from them given low Mg.   See the specific notes for further information. I have copied and placed below the plan per oncology from 2 days ago  1.  Stage III IgG kappa light chain multiple myeloma: - We have treated him with dexamethasone and cyclophosphamide on 11/02/2020 while he was inpatient. - We discussed bone marrow biopsy results in detail. - We have also discussed high risk disease based on myeloma FISH panel. - We will start him on Dara with VRD based on [facility] protocol. - We will start him on Revlimid at low-dose of 15 mg 2 weeks on 1 week off.  He will get his medication today and will start taking it once  he gets home. - We will start him on subcu Darzalex and Velcade today.  He will receive 40 mg of dexamethasone in our office. - We have discussed side effects in detail. - Because of high risk disease, I will make a referral to bone marrow transplant center once his insurance details are sorted out. - I reviewed labs from today which showed creatinine is 1.06 and calcium of 9.3. - Hemoglobin is 7.6.  White count is elevated at 14.1 and platelets are normal. - We will reevaluate him in 1 week.  2.  Hypomagnesemia: - Magnesium today is 1.5 g.  He will receive IV magnesium.  If it continues to be low will consider oral magnesium.  3.  Elevated carbon monoxide levels: - He was reportedly found to have elevated carbon monoxide level while he was hospitalized.  He is a non-smoker. - We will plan to repeat carbon monoxide level.  Follow-up with oncology in 1 week as planned  [facility] was seen today for hospitalization follow-up.  Diagnoses and all orders for this visit:  Acute respiratory failure with hypoxia (HCC)  AKI (acute kidney injury) (HCC)  Anemia, unspecified type  Community acquired pneumonia, unspecified laterality  Essential hypertension, benign  History of COVID-19  History of exposure to respiratory irritant  History of recent hospitalization  Hypercalcemia  Metastatic malignant neoplasm, unspecified site (HCC)  Moderate protein-calorie malnutrition (HCC)  Noncompliance   F/u with oncology 

## 2020-11-23 NOTE — Telephone Encounter (Signed)
Per office note on 5/9, patient should have received Revlimid on 5/9.  [facility] [place] Phone [phone] Fax [phone] 11/23/2020 12:02 PM

## 2020-11-24 LAB — SAR COV2 SEROLOGY (COVID19)AB(IGG),IA
SARS-CoV-2 Semi-Quant IgG Ab: <13 AU/mL (ref <13.0)
SARS-CoV-2 Spike Ab Interp: NEGATIVE

## 2020-11-26 NOTE — Progress Notes (Signed)
[facility] [address], [place] [address]   CLINIC:  Medical Oncology/Hematology  PCP:  Jac Canavan, PA-C [address] / [place] [address] [phone]   REASON FOR VISIT:  Follow-up for multiple myeloma  PRIOR THERAPY: none  NGS Results: not done  CURRENT THERAPY: DaraBorD every 3 weeks; Revlimid 15 mg 2/3 weeks  BRIEF ONCOLOGIC HISTORY:  Oncology History  Multiple myeloma not having achieved remission (HCC)  11/01/2020 Initial Diagnosis   Multiple myeloma not having achieved remission (HCC)   11/02/2020 - 11/02/2020 Chemotherapy         11/21/2020 -  Chemotherapy    Patient is on Treatment Plan: MYELOMA NEWLY DIAGNOSED TRANSPLANT CANDIDATE DARAVRD (DARATUMUMAB SQ) Q21D X 6 CYCLES (INDUCTION/CONSOLIDATION)        CANCER STAGING: Cancer Staging No matching staging information was found for the patient.  INTERVAL HISTORY:  [name], a 65 y.o. male, returns for routine follow-up and consideration for next cycle of chemotherapy. Paul Norton was last seen on 11/21/2020.  Due for day #8 cycle #1 of DaraVRd today.   Overall, he tells me he has been feeling pretty well. Today he is accompanied by his wife. He denies diarrhea and constipation. He reports mild drowsiness after taking medication. His appetite is good and reports no new pains. He reports improvement with breathing while walking. He reports mild soreness in his mouth, but this does not impede his appetite. He is currently taking calcium, multivitamin, Asprin (81 mg), and revlimid-once daily in the evening. He is no longer taking Norvasc or Albuterol inhaler. He reports feeling increased strength.  Overall, he feels ready for next cycle of chemo today.    REVIEW OF SYSTEMS:  Review of Systems  Constitutional: Negative for appetite change and fatigue.  Gastrointestinal: Negative for constipation and diarrhea.  All other systems reviewed and are  negative.   PAST MEDICAL/SURGICAL HISTORY:  Past Medical History:  Diagnosis Date  . Broken leg   . Colonoscopy refused 09/2017  . Hyperlipidemia   . Hypertension   . Multiple myeloma (HCC)   . Pneumonia   . Refuses treatment 09/2017   refuses screenings such as cancer screening, refuses vaccines, refuses normal preventative care  . Vaccine refused by patient    all vaccines as of 09/2017   Past Surgical History:  Procedure Laterality Date  . COLONOSCOPY     never, declines as of 09/2017    SOCIAL HISTORY:  Social History   Socioeconomic History  . Marital status: Divorced    Spouse name: Not on file  . Number of children: 1  . Years of education: Not on file  . Highest education level: Not on file  Occupational History  . Occupation: Disability  Tobacco Use  . Smoking status: Never Smoker  . Smokeless tobacco: Never Used  Substance and Sexual Activity  . Alcohol use: No  . Drug use: Never  . Sexual activity: Not Currently  Other Topics Concern  . Not on file  Social History Narrative  . Not on file   Social Determinants of Health   Financial Resource Strain: Medium Risk  . Difficulty of Paying Living Expenses: Somewhat hard  Food Insecurity: No Food Insecurity  . Worried About Programme researcher, broadcasting/film/video in the Last Year: Never true  . Ran Out of Food in the Last Year: Never true  Transportation Needs: No Transportation Needs  . Lack of Transportation (Medical): No  . Lack of Transportation (Non-Medical):  No  Physical Activity: Insufficiently Active  . Days of Exercise per Week: 5 days  . Minutes of Exercise per Session: 20 min  Stress: No Stress Concern Present  . Feeling of Stress : Not at all  Social Connections: Socially Isolated  . Frequency of Communication with Friends and Family: More than three times a week  . Frequency of Social Gatherings with Friends and Family: More than three times a week  . Attends Religious Services: Never  . Active Member of  Clubs or Organizations: No  . Attends Archivist Meetings: Never  . Marital Status: Divorced  Human resources officer Violence: Not At Risk  . Fear of Current or Ex-Partner: No  . Emotionally Abused: No  . Physically Abused: No  . Sexually Abused: No    FAMILY HISTORY:  No family history on file.  CURRENT MEDICATIONS:  Current Outpatient Medications  Medication Sig Dispense Refill  . acetaminophen (TYLENOL) 325 MG tablet Take 2 tablets (650 mg total) by mouth every 6 (six) hours as needed for mild pain (or Fever >/= 101). (Patient not taking: Reported on 11/23/2020) 30 tablet 30  . amLODipine (NORVASC) 10 MG tablet Take 1 tablet (10 mg total) by mouth daily. (Patient not taking: No sig reported) 90 tablet 3  . aspirin 81 MG chewable tablet Chew by mouth daily.    . calcium-vitamin D (OSCAL WITH D) 500-200 MG-UNIT tablet Take 1 tablet by mouth 2 (two) times daily. 60 tablet 0  . feeding supplement (ENSURE ENLIVE / ENSURE PLUS) LIQD Take 237 mLs by mouth 3 (three) times daily between meals. 237 mL 12  . fluconazole (DIFLUCAN) 100 MG tablet Take 1 tablet (100 mg total) by mouth daily. (Patient not taking: No sig reported) 7 tablet 0  . hydrocortisone (ANUSOL-HC) 25 MG suppository Place 1 suppository (25 mg total) rectally 2 (two) times daily. (Patient not taking: No sig reported) 12 suppository 0  . Ipratropium-Albuterol (COMBIVENT RESPIMAT) 20-100 MCG/ACT AERS respimat Inhale 1 puff into the lungs every 6 (six) hours as needed for wheezing or shortness of breath. (Patient not taking: No sig reported) 4 g 2  . lenalidomide (REVLIMID) 15 MG capsule Take 1 capsule (15 mg total) by mouth daily. 14 days on, 7 days off (Patient not taking: No sig reported) 14 capsule 0  . metoprolol tartrate (LOPRESSOR) 100 MG tablet Take 1 tablet (100 mg total) by mouth 2 (two) times daily. (Patient not taking: No sig reported) 180 tablet 3   No current facility-administered medications for this visit.     ALLERGIES:  No Known Allergies  PHYSICAL EXAM:  Performance status (ECOG): 1 - Symptomatic but completely ambulatory  There were no vitals filed for this visit. Wt Readings from Last 3 Encounters:  11/23/20 153 lb 12.8 oz (69.8 kg)  11/21/20 152 lb 1.9 oz (69 kg)  11/15/20 154 lb 12.2 oz (70.2 kg)   Physical Exam Vitals reviewed.  Constitutional:      Appearance: Normal appearance.  Cardiovascular:     Rate and Rhythm: Normal rate and regular rhythm.     Pulses: Normal pulses.     Heart sounds: Normal heart sounds.  Pulmonary:     Effort: Pulmonary effort is normal.     Breath sounds: Normal breath sounds.  Abdominal:     Palpations: Abdomen is soft. There is no hepatomegaly or splenomegaly.     Tenderness: There is no abdominal tenderness.  Musculoskeletal:     Right lower leg: No edema.  Left lower leg: No edema.  Neurological:     General: No focal deficit present.     Mental Status: He is alert and oriented to person, place, and time.  Psychiatric:        Mood and Affect: Mood normal.        Behavior: Behavior normal.     LABORATORY DATA:  I have reviewed the labs as listed.  CBC Latest Ref Rng & Units 11/21/2020 11/16/2020 11/16/2020  WBC 4.0 - 10.5 K/uL 14.1(H) - 3.6(L)  Hemoglobin 13.0 - 17.0 g/dL 7.6(L) 7.4(L) 7.0(L)  Hematocrit 39.0 - 52.0 % 24.1(L) 22.7(L) 21.2(L)  Platelets 150 - 400 K/uL 262 - 134(L)   CMP Latest Ref Rng & Units 11/21/2020 11/16/2020 11/15/2020  Glucose 70 - 99 mg/dL 106(H) 104(H) 119(H)  BUN 8 - 23 mg/dL 12 21 30(H)  Creatinine 0.61 - 1.24 mg/dL 1.06 1.17 1.67(H)  Sodium 135 - 145 mmol/L 131(L) 131(L) 126(L)  Potassium 3.5 - 5.1 mmol/L 4.0 3.0(L) 2.5(LL)  Chloride 98 - 111 mmol/L 99 104 97(L)  CO2 22 - 32 mmol/L $RemoveB'29 24 22  '[id number]$ Calcium 8.9 - 10.3 mg/dL 9.3 7.0(L) 6.8(L)  Total Protein 6.5 - 8.1 g/dL 9.9(H) 8.3(H) 10.0(H)  Total Bilirubin 0.3 - 1.2 mg/dL 0.7 0.7 0.9  Alkaline Phos 38 - 126 U/L 58 48 58  AST 15 - 41 U/L 27 27 36  ALT 0 -  44 U/L 24 28 34    DIAGNOSTIC IMAGING:  I have independently reviewed the scans and discussed with the patient. CT ABDOMEN PELVIS WO CONTRAST  Result Date: 10/30/2020 CLINICAL DATA:  Cancer of unknown primary, multiple lytic lesions in the calvaria noted incidentally. Dizziness, unintended weight loss. EXAM: CT CHEST, ABDOMEN AND PELVIS WITHOUT CONTRAST TECHNIQUE: Multidetector CT imaging of the chest, abdomen and pelvis was performed following the standard protocol without IV contrast. COMPARISON:  CT head 10/29/2020, chest radiograph 10/29/2020 FINDINGS: CT CHEST FINDINGS Cardiovascular: Cardiomegaly. Coronary artery calcifications. Hypoattenuation of the cardiac blood pool compatible with anemia. The aortic root is suboptimally assessed given cardiac pulsation artifact. Atherosclerotic plaque within the normal caliber aorta. Shared origin of the brachiocephalic and left common carotid arteries. Proximal great vessels are otherwise unremarkable. Central pulmonary arteries are top-normal caliber. No large central filling defects within limitations of a nonenhanced exam. Mediastinum/Nodes: No mediastinal fluid or gas. Normal thyroid gland and thoracic inlet. No acute abnormality of the trachea or esophagus. No worrisome mediastinal or axillary adenopathy. Hilar nodal evaluation is limited in the absence of intravenous contrast media. Lungs/Pleura: No consolidation, features of edema, pneumothorax, or effusion. No suspicious pulmonary nodules or masses. Some dependent atelectasis. Evaluation slightly limited by mild respiratory motion artifact. Musculoskeletal: Widespread extensive lytic mottling throughout the axial and appendicular skeleton. Multiple rib fractures are seen in various stages of healing many of which are presumed to be pathologic in nature. Severe degenerative changes are present in the bilateral shoulders including larger heterotopic ossifications seen in the left shoulder recess. Bridging  syndesmophytes across much of the thoracic levels and partial bridging across the spinous processes, could reflect some underlying spondyloarthropathy. CT ABDOMEN PELVIS FINDINGS Hepatobiliary: No visible focal liver lesion with limitations of an unenhanced CT. Smooth surface contour. Normal hepatic attenuation. Normal gallbladder and biliary tree without visible calcified gallstone. Pancreas: Mild pancreatic atrophy. No pancreatic ductal dilatation or surrounding inflammatory changes. Spleen: Splenomegaly within enlarged, lobular spleen. No visible focal splenic lesion within the limitations of this unenhanced exam. Adrenals/Urinary Tract: No adrenal mass or hemorrhage. Slight  under rotation of the bilateral kidneys, anatomic variant. No visible or contour deforming renal lesion. No urolithiasis or hydronephrosis. Urinary bladder is largely decompressed at the time of exam and therefore poorly evaluated by CT imaging. Mild circumferential bladder wall thickening may be related underdistention or chronic outlet obstruction given marked indentation of bladder base by an enlarged prostate. Stomach/Bowel: Distal esophagus, stomach and duodenum are unremarkable. No small bowel thickening or dilatation. Normal appendix in the right lower quadrant. Extensive distal colonic diverticulosis without active acute inflammation to suggest active diverticulitis. Some mild mural thickening in the sigmoid may be reflective of prior inflammation though warrants further evaluation with outpatient colonoscopy if not recently performed. Vascular/Lymphatic: Atherosclerotic calcifications within the abdominal aorta and branch vessels. No aneurysm or ectasia. No enlarged abdominopelvic lymph nodes. Reproductive: Enlarged prostate with few typically benign punctate calcifications. Trace right hydrocele. Other: No abdominopelvic free air or fluid. No bowel containing hernia. Musculoskeletal: Extensive lytic foci and bony remodeling  throughout the axial and appendicular skeleton of the abdomen and pelvis including the proximal femora as well. Bony ankylosis of the L4-L5 and lumbosacral junction as well as across the bilateral SI joints. IMPRESSION: 1. Extensive lytic foci and bony mottling throughout the axial and appendicular skeleton of the chest, abdomen and pelvis including the proximal femora and humeri as well as multiple bilateral rib fractures in various stages of healing many of which are presumed to be pathologic in nature. Findings are concerning for a diffuse osseous metastatic disease versus multiple myeloma. 2. Mild mural thickening in the sigmoid may be reflective of prior inflammation given the numerous colonic diverticula within the segment though warrants further evaluation with outpatient colonoscopy if not recently performed. 3. Splenomegaly within enlarged, lobular spleen, nonspecific but can be seen in the setting of myeloproliferative disorder. 4. Cardiomegaly and coronary artery calcifications. Hypoattenuation of the cardiac blood pool suggestive of anemia. 5. Trace right hydrocele. 6. Mild circumferential bladder wall thickening may be related underdistention or chronic outlet obstruction given marked indentation of bladder base by an enlarged prostate. Could correlate with urinalysis to exclude cystitis. 7. Prostatomegaly. 8. Aortic Atherosclerosis (ICD10-I70.0). Electronically Signed   By: Lovena Le M.D.   On: 10/30/2020 01:15   CT Head Wo Contrast  Result Date: 10/29/2020 CLINICAL DATA:  Dizziness, carbon monoxide exposure 2 months prior with COVID few weeks ago. Unintended weight loss. EXAM: CT HEAD WITHOUT CONTRAST TECHNIQUE: Contiguous axial images were obtained from the base of the skull through the vertex without intravenous contrast. COMPARISON:  None. FINDINGS: Brain: No evidence of acute infarction, hemorrhage, hydrocephalus, extra-axial collection, visible mass lesion or mass effect. Symmetric  prominence of the ventricles, cisterns and sulci compatible with parenchymal volume loss. Patchy areas of white matter hypoattenuation are most compatible with chronic microvascular angiopathy. Vascular: Atherosclerotic calcification of the carotid siphons. No hyperdense vessel. Skull: Mottled appearance of the bone particularly towards the clivus and sphenoid bones with multiple sites of lytic lesions, largest in the posterior left parietal bone measuring 19 x 8 mm (3/42) a slightly more expansile lytic focus is also seen in the right temporal bone measuring 15 x 7 mm (3/19) and in the right mastoid measuring 11 x 12 mm (3/9). Additional scattered foci elsewhere. No calvarial fracture. No scalp swelling or hematoma. Sinuses/Orbits: Nodular mural thickening in the paranasal sinuses, predominantly in the maxillary sinuses. Mastoid air cells are predominantly clear. Middle ear cavities are clear. Other: None IMPRESSION: 1. Mottled appearance of the calvaria and skull base with multitude of  lytic lesions concerning for metastatic disease or myeloma. 2. No acute intracranial abnormality is seen within the limitations of an unenhanced CT. Background of chronic microvascular angiopathy and parenchymal volume loss. These results were called by telephone at the time of interpretation on 10/29/2020 at 10:54 pm to provider [facility] ZAMMIT , who verbally acknowledged these results. Electronically Signed   By: Lovena Le M.D.   On: 10/29/2020 22:56   CT Chest Wo Contrast  Result Date: 10/30/2020 CLINICAL DATA:  Cancer of unknown primary, multiple lytic lesions in the calvaria noted incidentally. Dizziness, unintended weight loss. EXAM: CT CHEST, ABDOMEN AND PELVIS WITHOUT CONTRAST TECHNIQUE: Multidetector CT imaging of the chest, abdomen and pelvis was performed following the standard protocol without IV contrast. COMPARISON:  CT head 10/29/2020, chest radiograph 10/29/2020 FINDINGS: CT CHEST FINDINGS Cardiovascular:  Cardiomegaly. Coronary artery calcifications. Hypoattenuation of the cardiac blood pool compatible with anemia. The aortic root is suboptimally assessed given cardiac pulsation artifact. Atherosclerotic plaque within the normal caliber aorta. Shared origin of the brachiocephalic and left common carotid arteries. Proximal great vessels are otherwise unremarkable. Central pulmonary arteries are top-normal caliber. No large central filling defects within limitations of a nonenhanced exam. Mediastinum/Nodes: No mediastinal fluid or gas. Normal thyroid gland and thoracic inlet. No acute abnormality of the trachea or esophagus. No worrisome mediastinal or axillary adenopathy. Hilar nodal evaluation is limited in the absence of intravenous contrast media. Lungs/Pleura: No consolidation, features of edema, pneumothorax, or effusion. No suspicious pulmonary nodules or masses. Some dependent atelectasis. Evaluation slightly limited by mild respiratory motion artifact. Musculoskeletal: Widespread extensive lytic mottling throughout the axial and appendicular skeleton. Multiple rib fractures are seen in various stages of healing many of which are presumed to be pathologic in nature. Severe degenerative changes are present in the bilateral shoulders including larger heterotopic ossifications seen in the left shoulder recess. Bridging syndesmophytes across much of the thoracic levels and partial bridging across the spinous processes, could reflect some underlying spondyloarthropathy. CT ABDOMEN PELVIS FINDINGS Hepatobiliary: No visible focal liver lesion with limitations of an unenhanced CT. Smooth surface contour. Normal hepatic attenuation. Normal gallbladder and biliary tree without visible calcified gallstone. Pancreas: Mild pancreatic atrophy. No pancreatic ductal dilatation or surrounding inflammatory changes. Spleen: Splenomegaly within enlarged, lobular spleen. No visible focal splenic lesion within the limitations of  this unenhanced exam. Adrenals/Urinary Tract: No adrenal mass or hemorrhage. Slight under rotation of the bilateral kidneys, anatomic variant. No visible or contour deforming renal lesion. No urolithiasis or hydronephrosis. Urinary bladder is largely decompressed at the time of exam and therefore poorly evaluated by CT imaging. Mild circumferential bladder wall thickening may be related underdistention or chronic outlet obstruction given marked indentation of bladder base by an enlarged prostate. Stomach/Bowel: Distal esophagus, stomach and duodenum are unremarkable. No small bowel thickening or dilatation. Normal appendix in the right lower quadrant. Extensive distal colonic diverticulosis without active acute inflammation to suggest active diverticulitis. Some mild mural thickening in the sigmoid may be reflective of prior inflammation though warrants further evaluation with outpatient colonoscopy if not recently performed. Vascular/Lymphatic: Atherosclerotic calcifications within the abdominal aorta and branch vessels. No aneurysm or ectasia. No enlarged abdominopelvic lymph nodes. Reproductive: Enlarged prostate with few typically benign punctate calcifications. Trace right hydrocele. Other: No abdominopelvic free air or fluid. No bowel containing hernia. Musculoskeletal: Extensive lytic foci and bony remodeling throughout the axial and appendicular skeleton of the abdomen and pelvis including the proximal femora as well. Bony ankylosis of the L4-L5 and lumbosacral junction as well  as across the bilateral SI joints. IMPRESSION: 1. Extensive lytic foci and bony mottling throughout the axial and appendicular skeleton of the chest, abdomen and pelvis including the proximal femora and humeri as well as multiple bilateral rib fractures in various stages of healing many of which are presumed to be pathologic in nature. Findings are concerning for a diffuse osseous metastatic disease versus multiple myeloma. 2. Mild  mural thickening in the sigmoid may be reflective of prior inflammation given the numerous colonic diverticula within the segment though warrants further evaluation with outpatient colonoscopy if not recently performed. 3. Splenomegaly within enlarged, lobular spleen, nonspecific but can be seen in the setting of myeloproliferative disorder. 4. Cardiomegaly and coronary artery calcifications. Hypoattenuation of the cardiac blood pool suggestive of anemia. 5. Trace right hydrocele. 6. Mild circumferential bladder wall thickening may be related underdistention or chronic outlet obstruction given marked indentation of bladder base by an enlarged prostate. Could correlate with urinalysis to exclude cystitis. 7. Prostatomegaly. 8. Aortic Atherosclerosis (ICD10-I70.0). Electronically Signed   By: Lovena Le M.D.   On: 10/30/2020 01:15   DG Chest Port 1 View  Result Date: 11/15/2020 CLINICAL DATA:  Shortness of breath with cough EXAM: PORTABLE CHEST 1 VIEW COMPARISON:  Chest radiograph October 31, 2020 and chest CT October 30, 2020 FINDINGS: Increased opacity in the left base is concerning for pneumonia. There is underlying interstitial thickening throughout the lungs. The heart size and pulmonary vascularity are normal. No adenopathy. Lytic bone lesions are seen throughout the chest. IMPRESSION: Left base opacity concerning for acute pneumonia. Underlying probable degree of bronchitis. Heart size normal. Widespread lytic bony lesions, an appearance that may well be indicative of multiple myeloma. Widespread metastatic disease could also present in this manner. Electronically Signed   By: Lowella Grip III M.D.   On: 11/15/2020 17:22   DG Chest Port 1 View  Result Date: 10/31/2020 CLINICAL DATA:  Altered mental status EXAM: PORTABLE CHEST 1 VIEW COMPARISON:  10/29/2020 FINDINGS: New elevation of the right hemidiaphragm with right basilar pleuroparenchymal process. Chronic interstitial changes. No pneumothorax.  Similar cardiomediastinal contours. IMPRESSION: New elevation of the right hemidiaphragm with right basilar atelectasis/consolidation and possible pleural effusion. Electronically Signed   By: Macy Mis M.D.   On: 10/31/2020 11:44   DG Chest Port 1 View  Result Date: 10/29/2020 CLINICAL DATA:  Weakness EXAM: PORTABLE CHEST 1 VIEW COMPARISON:  None. FINDINGS: Cardiac shadow is enlarged. Lungs are well aerated bilaterally. Mild vascular congestion is noted without interstitial edema. No focal infiltrate or sizable effusion is seen. Degenerative changes of the shoulder joints and thoracic spine are noted. IMPRESSION: Mild vascular congestion without acute infiltrate or edema. Electronically Signed   By: Inez Catalina M.D.   On: 10/29/2020 23:07   CT BONE MARROW BIOPSY  Addendum Date: 11/14/2020   ADDENDUM REPORT: 11/14/2020 14:18 ADDENDUM: In the third paragraph of the procedure section below, the report reads " The 11 gauge coaxial bone biopsy needle was re-advanced into a slightly different location within the LEFT iliac marrow space, positioning was confirmed with CT imaging and an additional bone marrow biopsy was obtained." The report should read "the 11 gauge coaxial bone needle was readvanced into a slightly different location within the RIGHT iliac marrow space, positioning was confirmed with CT imaging and additional bone marrow biopsy was obtained. And quotations Ruthann Cancer, MD [facility] Electronically Signed   By: Ruthann Cancer MD   On: 11/14/2020 14:18  Result Date: 11/14/2020 INDICATION: 65 year old male with history of multiple myeloma. EXAM: CT-GUIDED BONE MARROW BIOPSY AND ASPIRATION MEDICATIONS: None ANESTHESIA/SEDATION: Fentanyl 37.5 mcg IV; Versed 1 mg IV Sedation Time: 16 minutes; The patient was continuously monitored during the procedure by the interventional radiology nurse under my direct supervision. COMPLICATIONS: None  immediate. PROCEDURE: Informed consent was obtained from the patient following an explanation of the procedure, risks, benefits and alternatives. The patient understands, agrees and consents for the procedure. All questions were addressed. A time out was performed prior to the initiation of the procedure. The patient was positioned prone and non-contrast localization CT was performed of the pelvis to demonstrate the iliac marrow spaces. The operative site was prepped and draped in the usual sterile fashion. Under sterile conditions and local anesthesia, a 22 gauge spinal needle was utilized for procedural planning. Next, an 11 gauge coaxial bone biopsy needle was advanced into the right iliac marrow space. Needle position was confirmed with CT imaging. Initially, a bone marrow aspiration was performed. Next, a bone marrow biopsy was obtained with the 11 gauge outer bone marrow device. The 11 gauge coaxial bone biopsy needle was re-advanced into a slightly different location within the left iliac marrow space, positioning was confirmed with CT imaging and an additional bone marrow biopsy was obtained. Samples were prepared with the cytotechnologist and deemed adequate. The needle was removed and superficial hemostasis was obtained with manual compression. A dressing was applied. The patient tolerated the procedure well without immediate post procedural complication. IMPRESSION: Successful CT guided right iliac bone marrow aspiration and core biopsy. Ruthann Cancer, MD [facility] Radiology Electronically Signed: By: Ruthann Cancer MD On: 11/01/2020 10:52   ECHOCARDIOGRAM COMPLETE  Result Date: 10/30/2020    ECHOCARDIOGRAM REPORT   Patient Name:   LONZELL DORRIS Date of Exam: 10/30/2020 Medical Rec #:  [id number]             Height:       69.0 in Accession #:    [id number]            Weight:       151.9 lb Date of Birth:  [birth year]              BSA:          1.838  m Patient Age:    65 years              BP:           129/58 mmHg Patient Gender: M                     HR:           83 bpm. Exam Location:  [facility] Procedure: 2D Echo, Cardiac Doppler and Color Doppler Indications:    Elevated Troponin  History:        Patient has no prior history of Echocardiogram examinations.                 Risk Factors:Hypertension and Dyslipidemia. COVID-19 virus                 infection, Hypercalcemia, Moderate protein-calorie malnutrition,                 AKI (acute kidney injury) ([place]).  Sonographer:    Alvino Chapel RCS Referring Phys: [place]  1. Left ventricular ejection fraction, by estimation, is 60 to 65%. The left ventricle  has normal function. The left ventricle has no regional wall motion abnormalities. Left ventricular diastolic parameters are indeterminate. Elevated left atrial pressure.  2. Right ventricular systolic function is mildly reduced. The right ventricular size is moderately enlarged. There is moderately elevated pulmonary artery systolic pressure.  3. Left atrial size was severely dilated.  4. Right atrial size was severely dilated.  5. The mitral valve is normal in structure. Mild mitral valve regurgitation. No evidence of mitral stenosis.  6. The aortic valve is tricuspid. Aortic valve regurgitation is not visualized. No aortic stenosis is present.  7. The inferior vena cava is dilated in size with <50% respiratory variability, suggesting right atrial pressure of 15 mmHg. FINDINGS  Left Ventricle: Left ventricular ejection fraction, by estimation, is 60 to 65%. The left ventricle has normal function. The left ventricle has no regional wall motion abnormalities. The left ventricular internal cavity size was normal in size. There is  no left ventricular hypertrophy. Left ventricular diastolic parameters are indeterminate. Elevated left atrial pressure. Right Ventricle: The right ventricular size is moderately enlarged. Right  vetricular wall thickness was not assessed. Right ventricular systolic function is mildly reduced. There is moderately elevated pulmonary artery systolic pressure. The tricuspid regurgitant velocity is 2.99 m/s, and with an assumed right atrial pressure of 15 mmHg, the estimated right ventricular systolic pressure is 50.8 mmHg. Left Atrium: Left atrial size was severely dilated. Right Atrium: Right atrial size was severely dilated. Pericardium: There is no evidence of pericardial effusion. Mitral Valve: The mitral valve is normal in structure. Mild mitral valve regurgitation. No evidence of mitral valve stenosis. Tricuspid Valve: The tricuspid valve is normal in structure. Tricuspid valve regurgitation is mild . No evidence of tricuspid stenosis. Aortic Valve: The aortic valve is tricuspid. Aortic valve regurgitation is not visualized. No aortic stenosis is present. Aortic valve mean gradient measures 5.8 mmHg. Aortic valve peak gradient measures 12.0 mmHg. Aortic valve area, by VTI measures 3.01  cm. Pulmonic Valve: The pulmonic valve was not well visualized. Pulmonic valve regurgitation is not visualized. No evidence of pulmonic stenosis. Aorta: The aortic root is normal in size and structure. Pulmonary Artery: Moderate pulmonary HTN, PASP is 51 mmHg. Venous: The inferior vena cava is dilated in size with less than 50% respiratory variability, suggesting right atrial pressure of 15 mmHg. IAS/Shunts: No atrial level shunt detected by color flow Doppler.  LEFT VENTRICLE PLAX 2D LVIDd:         5.20 cm  Diastology LVIDs:         3.50 cm  LV e' medial:    6.20 cm/s LV PW:         0.90 cm  LV E/e' medial:  23.2 LV IVS:        1.10 cm  LV e' lateral:   13.80 cm/s LVOT diam:     2.20 cm  LV E/e' lateral: 10.4 LV SV:         98 LV SV Index:   53 LVOT Area:     3.80 cm  RIGHT VENTRICLE RV S prime:     15.90 cm/s TAPSE (M-mode): 2.3 cm LEFT ATRIUM              Index       RIGHT ATRIUM           Index LA diam:        4.90  cm  2.67 cm/m  RA Area:     27.80 cm LA Vol (A2C):  141.0 ml 76.71 ml/m RA Volume:   96.80 ml  52.67 ml/m LA Vol (A4C):   128.0 ml 69.64 ml/m LA Biplane Vol: 138.0 ml 75.08 ml/m  AORTIC VALVE AV Area (Vmax):    2.79 cm AV Area (Vmean):   2.74 cm AV Area (VTI):     3.01 cm AV Vmax:           172.87 cm/s AV Vmean:          112.345 cm/s AV VTI:            0.324 m AV Peak Grad:      12.0 mmHg AV Mean Grad:      5.8 mmHg LVOT Vmax:         127.00 cm/s LVOT Vmean:        80.900 cm/s LVOT VTI:          0.257 m LVOT/AV VTI ratio: 0.79  AORTA Ao Root diam: 3.00 cm MITRAL VALVE                 TRICUSPID VALVE MV Area (PHT): 4.54 cm      TR Peak grad:   35.8 mmHg MV Decel Time: 167 msec      TR Vmax:        299.00 cm/s MR Peak grad:    94.9 mmHg MR Mean grad:    62.0 mmHg   SHUNTS MR Vmax:         487.00 cm/s Systemic VTI:  0.26 m MR Vmean:        371.0 cm/s  Systemic Diam: 2.20 cm MR PISA:         2.26 cm MR PISA Eff ROA: 15 mm MR PISA Radius:  0.60 cm MV E velocity: 144.00 cm/s MV A velocity: 45.90 cm/s MV E/A ratio:  3.14 Carlyle Dolly MD Electronically signed by Carlyle Dolly MD Signature Date/Time: 10/30/2020/12:07:16 PM    Final      ASSESSMENT:  1.  IgG kappa light chain multiple myeloma: - Admission with hypercalcemia and renal failure. - CT CAP showed extensive lytic foci throughout the axial and appendicular skeleton of the chest, abdomen and pelvis.  Splenomegaly with an enlarged lobular spleen, nonspecific. - Bone marrow biopsy on 11/01/2020 shows 85% atypical plasma cells in the aspirate. - FISH panel positive for 1 p-,-13,14q-,16q-,17p-/-17 and 20q- - Cytogenetics-no metaphases available for analysis. - SPEP with 5.4 g of M spike.  Free light chain ratio 1030.  LDH normal.  Beta-2 microglobulin 18.7.  24-hour urine total protein 3.6 g. - Dara VRD started on 11/21/2020.  Revlimid 15 mg 2 weeks on 1 week off started on 11/22/2020.  2.  Social/family history: - He worked as a Administrator.   He lives by himself. - Mother with breast and ovarian cancer.  Father had metastatic kidney cancer.  His brother's daughter has CML.   PLAN:  1.  Stage III IgG kappa light chain multiple myeloma: -  He started taking Revlimid 15 mg 2 weeks on 1 week off on 11/22/2020. - He did not report any GI side effects. - He did not have any reactions with daratumumab or side effects from Velcade. - Reviewed his labs today which showed slightly elevated creatinine 1.24.  LFTs are grossly normal with albumin improved to 2.3. - He continues to have anemia with hemoglobin 7.1, likely from bone marrow infiltration from myeloma which will improve with treatments. - We will check his CBC weekly and transfuse as needed. -  He will proceed with day 8 of treatment today. - RTC 2 weeks with repeat labs.  I will check SPEP and free light chains at that time.  2.  Hypomagnesemia: - We will check his magnesium levels at next visit.  3.  Elevated carbon monoxide levels: - He was found to have elevated carbon monoxide levels when he was hospitalized. - His O2 sats today are 93% on room air.  He reportedly dropped his sats to 89% on exertion at his PCPs office. - We will plan to recheck his carboxyhemoglobin.  If they continue to be elevated, we will make a referral to pulmonary. - Lungs are clear to auscultation today.  No need for any imaging.  4.  ID prophylaxis: - We will start him on acyclovir 400 mg twice daily.  Continue aspirin 81 mg daily for thromboprophylaxis.  5.  Myeloma bone disease: - Calcium today is 8.9. - We will plan to start him on denosumab in 2 weeks.   Orders placed this encounter:  No orders of the defined types were placed in this encounter.    Derek Jack, MD [place] [phone]   I, Thana Ates, am acting as a scribe for Dr. Derek Jack.  I, Derek Jack MD, have reviewed the above documentation for accuracy and completeness, and I  agree with the above.

## 2020-11-28 ENCOUNTER — Inpatient Hospital Stay (HOSPITAL_COMMUNITY): Payer: Commercial Managed Care - PPO | Admitting: Dietician

## 2020-11-28 ENCOUNTER — Other Ambulatory Visit: Payer: Self-pay

## 2020-11-28 ENCOUNTER — Inpatient Hospital Stay (HOSPITAL_COMMUNITY): Payer: Commercial Managed Care - PPO

## 2020-11-28 ENCOUNTER — Inpatient Hospital Stay (HOSPITAL_BASED_OUTPATIENT_CLINIC_OR_DEPARTMENT_OTHER): Payer: Commercial Managed Care - PPO | Admitting: Hematology

## 2020-11-28 VITALS — BP 159/68 | HR 106 | Temp 97.2°F | Resp 18 | Wt 151.6 lb

## 2020-11-28 DIAGNOSIS — Z5112 Encounter for antineoplastic immunotherapy: Secondary | ICD-10-CM | POA: Diagnosis not present

## 2020-11-28 DIAGNOSIS — C9000 Multiple myeloma not having achieved remission: Secondary | ICD-10-CM

## 2020-11-28 DIAGNOSIS — Z7729 Contact with and (suspected ) exposure to other hazardous substances: Secondary | ICD-10-CM

## 2020-11-28 LAB — CBC WITH DIFFERENTIAL/PLATELET
Abs Immature Granulocytes: 0.16 10*3/uL — ABNORMAL HIGH (ref 0.00–0.07)
Basophils Absolute: 0 10*3/uL (ref 0.0–0.1)
Basophils Relative: 0 %
Eosinophils Absolute: 0.1 10*3/uL (ref 0.0–0.5)
Eosinophils Relative: 1 %
HCT: 22.5 % — ABNORMAL LOW (ref 39.0–52.0)
Hemoglobin: 7.1 g/dL — ABNORMAL LOW (ref 13.0–17.0)
Immature Granulocytes: 2 %
Lymphocytes Relative: 7 %
Lymphs Abs: 0.5 10*3/uL — ABNORMAL LOW (ref 0.7–4.0)
MCH: 31.1 pg (ref 26.0–34.0)
MCHC: 31.6 g/dL (ref 30.0–36.0)
MCV: 98.7 fL (ref 80.0–100.0)
Monocytes Absolute: 0.6 10*3/uL (ref 0.1–1.0)
Monocytes Relative: 8 %
Neutro Abs: 6 10*3/uL (ref 1.7–7.7)
Neutrophils Relative %: 82 %
Platelets: 314 10*3/uL (ref 150–400)
RBC: 2.28 MIL/uL — ABNORMAL LOW (ref 4.22–5.81)
RDW: 19.5 % — ABNORMAL HIGH (ref 11.5–15.5)
WBC: 7.3 10*3/uL (ref 4.0–10.5)
nRBC: 0 % (ref 0.0–0.2)

## 2020-11-28 LAB — COMPREHENSIVE METABOLIC PANEL
ALT: 33 U/L (ref 0–44)
AST: 33 U/L (ref 15–41)
Albumin: 2.3 g/dL — ABNORMAL LOW (ref 3.5–5.0)
Alkaline Phosphatase: 81 U/L (ref 38–126)
Anion gap: 3 — ABNORMAL LOW (ref 5–15)
BUN: 39 mg/dL — ABNORMAL HIGH (ref 8–23)
CO2: 27 mmol/L (ref 22–32)
Calcium: 8.9 mg/dL (ref 8.9–10.3)
Chloride: 99 mmol/L (ref 98–111)
Creatinine, Ser: 1.24 mg/dL (ref 0.61–1.24)
GFR, Estimated: >60 mL/min (ref >60)
Glucose, Bld: 124 mg/dL — ABNORMAL HIGH (ref 70–99)
Potassium: 4.7 mmol/L (ref 3.5–5.1)
Sodium: 129 mmol/L — ABNORMAL LOW (ref 135–145)
Total Bilirubin: 0.6 mg/dL (ref 0.3–1.2)
Total Protein: 9.5 g/dL — ABNORMAL HIGH (ref 6.5–8.1)

## 2020-11-28 MED ORDER — BORTEZOMIB CHEMO SQ INJECTION 3.5 MG (2.5MG/ML)
1.3000 mg/m2 | Freq: Once | INTRAMUSCULAR | Status: AC
  Administered 2020-11-28: 2.5 mg via SUBCUTANEOUS
  Filled 2020-11-28: qty 1

## 2020-11-28 MED ORDER — DIPHENHYDRAMINE HCL 25 MG PO CAPS
50.0000 mg | ORAL_CAPSULE | Freq: Once | ORAL | Status: AC
  Administered 2020-11-28: 50 mg via ORAL
  Filled 2020-11-28: qty 2

## 2020-11-28 MED ORDER — DARATUMUMAB-HYALURONIDASE-FIHJ 1800-30000 MG-UT/15ML ~~LOC~~ SOLN
1800.0000 mg | Freq: Once | SUBCUTANEOUS | Status: AC
  Administered 2020-11-28: 1800 mg via SUBCUTANEOUS
  Filled 2020-11-28: qty 15

## 2020-11-28 MED ORDER — ACYCLOVIR 400 MG PO TABS: 400.0000 mg | ORAL_TABLET | Freq: Two times a day (BID) | ORAL | 6 refills | Status: DC

## 2020-11-28 MED ORDER — ACETAMINOPHEN 325 MG PO TABS
650.0000 mg | ORAL_TABLET | Freq: Once | ORAL | Status: AC
Start: 2020-11-28 — End: 2020-11-28
  Administered 2020-11-28: 650 mg via ORAL
  Filled 2020-11-28: qty 2

## 2020-11-28 MED ORDER — MONTELUKAST SODIUM 10 MG PO TABS
10.0000 mg | ORAL_TABLET | Freq: Once | ORAL | Status: AC
  Administered 2020-11-28: 10 mg via ORAL
  Filled 2020-11-28: qty 1

## 2020-11-28 MED ORDER — DEXAMETHASONE 4 MG PO TABS
40.0000 mg | ORAL_TABLET | Freq: Once | ORAL | Status: AC
  Administered 2020-11-28: 40 mg via ORAL
  Filled 2020-11-28: qty 10

## 2020-11-28 NOTE — Progress Notes (Signed)
Patient reports taking Revlimid with no adverse side effects. 

## 2020-11-28 NOTE — Progress Notes (Signed)
Patient assessed and labs reviewed by Dr Katragadda.  No distress noted.  Okay for treatment today.  

## 2020-11-28 NOTE — Progress Notes (Signed)
Labs reviewed with MD today. Sodium 129 and BUN of 39 noted by MD. No additional orders. Proceed with treatment as planned per MD.  2nd dose of darzalex given today, patient was observed for on hour post injection per MD protocol.  Treatment given per orders. Patient tolerated it well without problems. Vitals stable and discharged home from clinic via wheelchair.  Follow up as scheduled.

## 2020-11-28 NOTE — Patient Instructions (Signed)
[facility] at [facility] °Discharge Instructions ° °You were seen today by Dr. Katragadda. He went over your recent results. Dr. Katragadda will see you back in 2 weeks for labs and follow up. ° ° °Thank you for choosing [facility] at [facility] to provide your oncology and hematology care.  To afford each patient quality time with our provider, please arrive at least 15 minutes before your scheduled appointment time.  ° °If you have a lab appointment with the Cancer Center please come in thru the Main Entrance and check in at the main information desk ° °You need to re-schedule your appointment should you arrive 10 or more minutes late.  We strive to give you quality time with our providers, and arriving late affects you and other patients whose appointments are after yours.  Also, if you no show three or more times for appointments you may be dismissed from the clinic at the providers discretion.     °Again, thank you for choosing [facility].  Our hope is that these requests will decrease the amount of time that you wait before being seen by our physicians.       °_____________________________________________________________ ° °Should you have questions after your visit to [facility], please contact our office at [phone] between the hours of 8:00 a.m. and 4:30 p.m.  Voicemails left after 4:00 p.m. will not be returned until the following business day.  For prescription refill requests, have your pharmacy contact our office and allow 72 hours.   ° °Cancer Center Support Programs:  ° °> Cancer Support Group  °2nd Tuesday of the month 1pm-2pm, Journey [facility]  ° ° °

## 2020-11-28 NOTE — Progress Notes (Signed)
Nutrition Assessment   Reason for Assessment: MST (+weight loss/poor appetite)   ASSESSMENT: 65 year old male with newly diagnosed myeloma transplant candidate. He is receiving chemotherapy with DaraBorD.  Noted recent hospital admission 5/3-5/4 due to pneumonia.   Met with patient and sister in infusion.  Sister reports patient has lost a lot of weight and muscle mass in the last few months after being diagnosed with Covid. 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 Patient later found to be negative for Covid antibodies, and symptoms thought to be secondary to high level of carbon monoxide found in blood work after being exposed due to malfunctioning exhaust pipe on truck. Patient reports now his appetite is "pretty good" and looking forward to eating at [employer] after treatment today. He reports foods previously having metallic taste but this has resolved. He continues to have some mouth sores, reports these are getting better. Patient reports mouth pain while eating cheese straws, but "they were good, so kept on eating" Patient is drinking water with lemon slices today, reports this irritates his mouth, but likes the way it taste. This morning he had 2 bowls of cheerios, chicken and dumplings with milk and a Boost for breakfast. He has been drinking 2 Boost or Ensure daily and likes these. Patient has been staying with sister who prepares meals. She recalls patient eating a variety of foods such as steak, beans, soups, chicken salad.   Medications: Ensure, Vit D   Labs: Na 129, Glucose 124, BUN 39, Albumin 2.3, Hgb 7.1   Anthropometrics: Weight 151 lb 9.6 oz today decreased 2.3% from 154 lb 12.2 oz on 5/3 and decreased 44 lbs (22%) over the last 8 months which is significant.  Height: 5'11" Weight: 68.8 kg  UBW: 195 lb (03/2020) BMI: 21.14   Estimated Energy Needs  Kcals: 2065-2200 Protein: 100-115 Fluid: 2.2  L   NUTRITION DIAGNOSIS: Unintentional weight loss related to multiple myeloma and recent carbon monoxide poisoning as evidenced by 22% weight decrease from usual weight in 8 months which is significant   INTERVENTION: Educated on importance of adequate calories and protein to maintain strength, weights, nutrition during treatment Discussed soft and moist high protein foods, handout provided Discussed strategies for altered taste, handout provided Encouraged baking soda/salt water rinses before meals, recipe given Educated on supplement types, encouraged to drink 2-3 Ensure Enlive or equivalent daily Complimentary case of Ensure Enlive provided today Noted Ensure ordered via prescription, sister informed and encouraged to contact pharmacy about cost/insurance coverage    MONITORING, EVALUATION, GOAL: Patient will tolerate increased calories and protein to minimize weight loss during treatment   Next Visit: Thursday June 2 via telephone

## 2020-11-28 NOTE — Patient Instructions (Signed)
[facility]  Discharge Instructions: Thank you for choosing [facility] to provide your oncology and hematology care.  If you have a lab appointment with the Cancer Center, please come in thru the Main Entrance and check in at the main information desk.  Wear comfortable clothing and clothing appropriate for easy access to any Portacath or PICC line.   We strive to give you quality time with your provider. You may need to reschedule your appointment if you arrive late (15 or more minutes).  Arriving late affects you and other patients whose appointments are after yours.  Also, if you miss three or more appointments without notifying the office, you may be dismissed from the clinic at the provider's discretion.      For prescription refill requests, have your pharmacy contact our office and allow 72 hours for refills to be completed.        To help prevent nausea and vomiting after your treatment, we encourage you to take your nausea medication as directed.  BELOW ARE SYMPTOMS THAT SHOULD BE REPORTED IMMEDIATELY: *FEVER GREATER THAN 100.4 F (38 C) OR HIGHER *CHILLS OR SWEATING *NAUSEA AND VOMITING THAT IS NOT CONTROLLED WITH YOUR NAUSEA MEDICATION *UNUSUAL SHORTNESS OF BREATH *UNUSUAL BRUISING OR BLEEDING *URINARY PROBLEMS (pain or burning when urinating, or frequent urination) *BOWEL PROBLEMS (unusual diarrhea, constipation, pain near the anus) TENDERNESS IN MOUTH AND THROAT WITH OR WITHOUT PRESENCE OF ULCERS (sore throat, sores in mouth, or a toothache) UNUSUAL RASH, SWELLING OR PAIN  UNUSUAL VAGINAL DISCHARGE OR ITCHING   Items with * indicate a potential emergency and should be followed up as soon as possible or go to the Emergency Department if any problems should occur.  Please show the CHEMOTHERAPY ALERT CARD or IMMUNOTHERAPY ALERT CARD at check-in to the Emergency Department and triage nurse.  Should you have questions after your visit or need to cancel  or reschedule your appointment, please contact [facility] [phone]  and follow the prompts.  Office hours are 8:00 a.m. to 4:30 p.m. Monday - Friday. Please note that voicemails left after 4:00 p.m. may not be returned until the following business day.  We are closed weekends and major holidays. You have access to a nurse at all times for urgent questions. Please call the main number to the clinic [phone] and follow the prompts.  For any non-urgent questions, you may also contact your provider using MyChart. We now offer e-Visits for anyone 18 and older to request care online for non-urgent symptoms. For details visit [email or web address].   Also download the MyChart app! Go to the app store, search "MyChart", open the app, select [facility], and log in with your MyChart username and password.  Due to Covid, a mask is required upon entering the hospital/clinic. If you do not have a mask, one will be given to you upon arrival. For doctor visits, patients may have 1 support person aged 18 or older with them. For treatment visits, patients cannot have anyone with them due to current Covid guidelines and our immunocompromised population.  

## 2020-11-29 ENCOUNTER — Other Ambulatory Visit (HOSPITAL_COMMUNITY): Payer: Self-pay | Admitting: *Deleted

## 2020-11-29 DIAGNOSIS — C9000 Multiple myeloma not having achieved remission: Secondary | ICD-10-CM

## 2020-12-02 ENCOUNTER — Other Ambulatory Visit (HOSPITAL_COMMUNITY): Payer: Self-pay

## 2020-12-02 ENCOUNTER — Other Ambulatory Visit (HOSPITAL_COMMUNITY): Payer: Self-pay | Admitting: *Deleted

## 2020-12-02 ENCOUNTER — Encounter (HOSPITAL_COMMUNITY): Payer: Self-pay

## 2020-12-02 MED ORDER — LENALIDOMIDE 15 MG PO CAPS: 15.0000 mg | ORAL_CAPSULE | Freq: Every day | ORAL | 0 refills | Status: DC

## 2020-12-02 NOTE — Telephone Encounter (Signed)
Chart reviewed. Revlimid refilled per Dr. Delton Coombes

## 2020-12-05 ENCOUNTER — Inpatient Hospital Stay (HOSPITAL_COMMUNITY): Payer: Commercial Managed Care - PPO

## 2020-12-05 ENCOUNTER — Encounter (HOSPITAL_COMMUNITY): Payer: Self-pay | Admitting: Hematology

## 2020-12-05 ENCOUNTER — Encounter (HOSPITAL_COMMUNITY): Payer: Self-pay

## 2020-12-05 ENCOUNTER — Other Ambulatory Visit: Payer: Self-pay

## 2020-12-05 VITALS — BP 132/59 | HR 94 | Temp 96.9°F | Resp 18 | Wt 154.4 lb

## 2020-12-05 DIAGNOSIS — Z5112 Encounter for antineoplastic immunotherapy: Secondary | ICD-10-CM | POA: Diagnosis not present

## 2020-12-05 DIAGNOSIS — C9000 Multiple myeloma not having achieved remission: Secondary | ICD-10-CM

## 2020-12-05 LAB — CBC WITH DIFFERENTIAL/PLATELET
Abs Immature Granulocytes: 0.02 10*3/uL (ref 0.00–0.07)
Basophils Absolute: 0 10*3/uL (ref 0.0–0.1)
Basophils Relative: 1 %
Eosinophils Absolute: 0.1 10*3/uL (ref 0.0–0.5)
Eosinophils Relative: 1 %
HCT: 23 % — ABNORMAL LOW (ref 39.0–52.0)
Hemoglobin: 7.1 g/dL — ABNORMAL LOW (ref 13.0–17.0)
Immature Granulocytes: 1 %
Lymphocytes Relative: 20 %
Lymphs Abs: 0.8 10*3/uL (ref 0.7–4.0)
MCH: 30.6 pg (ref 26.0–34.0)
MCHC: 30.9 g/dL (ref 30.0–36.0)
MCV: 99.1 fL (ref 80.0–100.0)
Monocytes Absolute: 0.7 10*3/uL (ref 0.1–1.0)
Monocytes Relative: 17 %
Neutro Abs: 2.6 10*3/uL (ref 1.7–7.7)
Neutrophils Relative %: 60 %
Platelets: 108 10*3/uL — ABNORMAL LOW (ref 150–400)
RBC: 2.32 MIL/uL — ABNORMAL LOW (ref 4.22–5.81)
RDW: 19.8 % — ABNORMAL HIGH (ref 11.5–15.5)
WBC: 4.3 10*3/uL (ref 4.0–10.5)
nRBC: 0 % (ref 0.0–0.2)

## 2020-12-05 LAB — COMPREHENSIVE METABOLIC PANEL
ALT: 33 U/L (ref 0–44)
AST: 31 U/L (ref 15–41)
Albumin: 2.6 g/dL — ABNORMAL LOW (ref 3.5–5.0)
Alkaline Phosphatase: 97 U/L (ref 38–126)
Anion gap: 6 (ref 5–15)
BUN: 30 mg/dL — ABNORMAL HIGH (ref 8–23)
CO2: 26 mmol/L (ref 22–32)
Calcium: 7.6 mg/dL — ABNORMAL LOW (ref 8.9–10.3)
Chloride: 101 mmol/L (ref 98–111)
Creatinine, Ser: 1.02 mg/dL (ref 0.61–1.24)
GFR, Estimated: >60 mL/min (ref >60)
Glucose, Bld: 120 mg/dL — ABNORMAL HIGH (ref 70–99)
Potassium: 3.7 mmol/L (ref 3.5–5.1)
Sodium: 133 mmol/L — ABNORMAL LOW (ref 135–145)
Total Bilirubin: 0.8 mg/dL (ref 0.3–1.2)
Total Protein: 7.4 g/dL (ref 6.5–8.1)

## 2020-12-05 MED ORDER — ACETAMINOPHEN 325 MG PO TABS
650.0000 mg | ORAL_TABLET | Freq: Once | ORAL | Status: AC
  Administered 2020-12-05: 650 mg via ORAL
  Filled 2020-12-05: qty 2

## 2020-12-05 MED ORDER — MONTELUKAST SODIUM 10 MG PO TABS
10.0000 mg | ORAL_TABLET | Freq: Once | ORAL | Status: AC
  Administered 2020-12-05: 10 mg via ORAL
  Filled 2020-12-05: qty 1

## 2020-12-05 MED ORDER — DEXAMETHASONE 4 MG PO TABS
40.0000 mg | ORAL_TABLET | Freq: Once | ORAL | Status: AC
  Administered 2020-12-05: 40 mg via ORAL
  Filled 2020-12-05: qty 10

## 2020-12-05 MED ORDER — DIPHENHYDRAMINE HCL 25 MG PO CAPS
50.0000 mg | ORAL_CAPSULE | Freq: Once | ORAL | Status: AC
  Administered 2020-12-05: 50 mg via ORAL
  Filled 2020-12-05: qty 2

## 2020-12-05 MED ORDER — DARATUMUMAB-HYALURONIDASE-FIHJ 1800-30000 MG-UT/15ML ~~LOC~~ SOLN
1800.0000 mg | Freq: Once | SUBCUTANEOUS | Status: AC
  Administered 2020-12-05: 1800 mg via SUBCUTANEOUS
  Filled 2020-12-05: qty 15

## 2020-12-05 NOTE — Progress Notes (Signed)
Patient tolerated Daratumumab injection with no complaints voiced. See MAR for details. Lab reviewed, okay to treat per verbal order by Dr. Delton Coombes. Injection site clean and dry with no bruising or swelling noted at site. Band aid applied. Vss with discharge and left in satisfactory condition with nos/s of distress noted.

## 2020-12-05 NOTE — Progress Notes (Signed)
Hgb 7.1 today and ok to treat verbal order Dr. Delton Coombes.  Patient does not need a blood transfusion today but we will recheck labs next week verbal order Dr. Delton Coombes.

## 2020-12-05 NOTE — Patient Instructions (Signed)
[facility]  Discharge Instructions: °Thank you for choosing [facility] to provide your oncology and hematology care.  °If you have a lab appointment with the Cancer Center, please come in thru the Main Entrance and check in at the main information desk. ° °Wear comfortable clothing and clothing appropriate for easy access to any Portacath or PICC line.  ° °We strive to give you quality time with your provider. You may need to reschedule your appointment if you arrive late (15 or more minutes).  Arriving late affects you and other patients whose appointments are after yours.  Also, if you miss three or more appointments without notifying the office, you may be dismissed from the clinic at the provider’s discretion.    °  °For prescription refill requests, have your pharmacy contact our office and allow 72 hours for refills to be completed.   ° °Today you received the following chemotherapy and/or immunotherapy agents Daratumumab °  °To help prevent nausea and vomiting after your treatment, we encourage you to take your nausea medication as directed. ° °BELOW ARE SYMPTOMS THAT SHOULD BE REPORTED IMMEDIATELY: °*FEVER GREATER THAN 100.4 F (38 °C) OR HIGHER °*CHILLS OR SWEATING °*NAUSEA AND VOMITING THAT IS NOT CONTROLLED WITH YOUR NAUSEA MEDICATION °*UNUSUAL SHORTNESS OF BREATH °*UNUSUAL BRUISING OR BLEEDING °*URINARY PROBLEMS (pain or burning when urinating, or frequent urination) °*BOWEL PROBLEMS (unusual diarrhea, constipation, pain near the anus) °TENDERNESS IN MOUTH AND THROAT WITH OR WITHOUT PRESENCE OF ULCERS (sore throat, sores in mouth, or a toothache) °UNUSUAL RASH, SWELLING OR PAIN  °UNUSUAL VAGINAL DISCHARGE OR ITCHING  ° °Items with * indicate a potential emergency and should be followed up as soon as possible or go to the Emergency Department if any problems should occur. ° °Please show the CHEMOTHERAPY ALERT CARD or IMMUNOTHERAPY ALERT CARD at check-in to the Emergency  Department and triage nurse. ° °Should you have questions after your visit or need to cancel or reschedule your appointment, please contact [facility] [phone]  and follow the prompts.  Office hours are 8:00 a.m. to 4:30 p.m. Monday - Friday. Please note that voicemails left after 4:00 p.m. may not be returned until the following business day.  We are closed weekends and major holidays. You have access to a nurse at all times for urgent questions. Please call the main number to the clinic [phone] and follow the prompts. ° °For any non-urgent questions, you may also contact your provider using MyChart. We now offer e-Visits for anyone 18 and older to request care online for non-urgent symptoms. For details visit [email or web address]. °  °Also download the MyChart app! Go to the app store, search "MyChart", open the app, select , and log in with your MyChart username and password. ° °Due to Covid, a mask is required upon entering the hospital/clinic. If you do not have a mask, one will be given to you upon arrival. For doctor visits, patients may have 1 support person aged 18 or older with them. For treatment visits, patients cannot have anyone with them due to current Covid guidelines and our immunocompromised population.  °

## 2020-12-08 NOTE — Progress Notes (Signed)
[facility] [address], [place] [address]   CLINIC:  Medical Oncology/Hematology  PCP:  Jac Canavan, PA-C [address] / [place] [address] [phone]   REASON FOR VISIT:  Follow-up for multiple myeloma  PRIOR THERAPY: none  NGS Results: not done  CURRENT THERAPY: DaraBorD every 3 weeks; Revlimid 15 mg 2/3 weeks  BRIEF ONCOLOGIC HISTORY:  Oncology History  Multiple myeloma not having achieved remission (HCC)  11/01/2020 Initial Diagnosis   Multiple myeloma not having achieved remission (HCC)   11/02/2020 - 11/02/2020 Chemotherapy         11/21/2020 -  Chemotherapy    Patient is on Treatment Plan: MYELOMA NEWLY DIAGNOSED TRANSPLANT CANDIDATE DARAVRD (DARATUMUMAB SQ) Q21D X 6 CYCLES (INDUCTION/CONSOLIDATION)        CANCER STAGING: Cancer Staging No matching staging information was found for the patient.  INTERVAL HISTORY:  Paul Norton, a 65 y.o. male, returns for routine follow-up and consideration for next cycle of chemotherapy. Paul Norton was last seen on 11/28/2020.  Due for cycle #2 of DaraVRd today.   Overall, he tells me he has been feeling pretty well. Today he is accompanied by his sister-in-law. He has not been taking Acyclovir due to wariness of potential side-effects. He has been taking Revlimid and tolerating it well. He has had a good appetite. He is taking Asprin, a multivitamin, and calcium+D.    Overall, he feels ready for next cycle of chemo today.   REVIEW OF SYSTEMS:  Review of Systems  Constitutional: Positive for appetite change (75%) and fatigue (75%).  All other systems reviewed and are negative.   PAST MEDICAL/SURGICAL HISTORY:  Past Medical History:  Diagnosis Date  . Broken leg   . Colonoscopy refused 09/2017  . Hyperlipidemia   . Hypertension   . Multiple myeloma (HCC)   . Pneumonia   . Refuses treatment 09/2017   refuses screenings such as cancer screening, refuses vaccines,  refuses normal preventative care  . Vaccine refused by patient    all vaccines as of 09/2017   Past Surgical History:  Procedure Laterality Date  . COLONOSCOPY     never, declines as of 09/2017    SOCIAL HISTORY:  Social History   Socioeconomic History  . Marital status: Divorced    Spouse name: Not on file  . Number of children: 1  . Years of education: Not on file  . Highest education level: Not on file  Occupational History  . Occupation: Disability  Tobacco Use  . Smoking status: Never Smoker  . Smokeless tobacco: Never Used  Substance and Sexual Activity  . Alcohol use: No  . Drug use: Never  . Sexual activity: Not Currently  Other Topics Concern  . Not on file  Social History Narrative  . Not on file   Social Determinants of Health   Financial Resource Strain: Medium Risk  . Difficulty of Paying Living Expenses: Somewhat hard  Food Insecurity: No Food Insecurity  . Worried About Programme researcher, broadcasting/film/video in the Last Year: Never true  . Ran Out of Food in the Last Year: Never true  Transportation Needs: No Transportation Needs  . Lack of Transportation (Medical): No  . Lack of Transportation (Non-Medical): No  Physical Activity: Insufficiently Active  . Days of Exercise per Week: 5 days  . Minutes of Exercise per Session: 20 min  Stress: No Stress Concern Present  . Feeling of Stress : Not at all  Social Connections:  Socially Isolated  . Frequency of Communication with Friends and Family: More than three times a week  . Frequency of Social Gatherings with Friends and Family: More than three times a week  . Attends Religious Services: Never  . Active Member of Clubs or Organizations: No  . Attends Archivist Meetings: Never  . Marital Status: Divorced  Human resources officer Violence: Not At Risk  . Fear of Current or Ex-Partner: No  . Emotionally Abused: No  . Physically Abused: No  . Sexually Abused: No    FAMILY HISTORY:  No family history on  file.  CURRENT MEDICATIONS:  Current Outpatient Medications  Medication Sig Dispense Refill  . acetaminophen (TYLENOL) 325 MG tablet Take 2 tablets (650 mg total) by mouth every 6 (six) hours as needed for mild pain (or Fever >/= 101). 30 tablet 30  . acyclovir (ZOVIRAX) 400 MG tablet Take 1 tablet (400 mg total) by mouth 2 (two) times daily. 60 tablet 6  . amLODipine (NORVASC) 10 MG tablet Take 1 tablet (10 mg total) by mouth daily. 90 tablet 3  . aspirin 81 MG chewable tablet Chew by mouth daily.    . calcium-vitamin D (OSCAL WITH D) 500-200 MG-UNIT tablet Take 1 tablet by mouth 2 (two) times daily. 60 tablet 0  . feeding supplement (ENSURE ENLIVE / ENSURE PLUS) LIQD Take 237 mLs by mouth 3 (three) times daily between meals. 237 mL 12  . lenalidomide (REVLIMID) 15 MG capsule Take 1 capsule (15 mg total) by mouth daily. 14 days on, 7 days off 14 capsule 0   No current facility-administered medications for this visit.    ALLERGIES:  No Known Allergies  PHYSICAL EXAM:  Performance status (ECOG): 1 - Symptomatic but completely ambulatory  There were no vitals filed for this visit. Wt Readings from Last 3 Encounters:  12/05/20 154 lb 6.4 oz (70 kg)  11/28/20 151 lb 9.6 oz (68.8 kg)  11/23/20 153 lb 12.8 oz (69.8 kg)   Physical Exam Vitals reviewed.  Constitutional:      Appearance: Normal appearance.  Cardiovascular:     Rate and Rhythm: Normal rate and regular rhythm.     Pulses: Normal pulses.     Heart sounds: Normal heart sounds.  Pulmonary:     Effort: Pulmonary effort is normal.     Breath sounds: Normal breath sounds.  Abdominal:     Palpations: Abdomen is soft. There is no hepatomegaly, splenomegaly or mass.     Tenderness: There is no abdominal tenderness.  Neurological:     General: No focal deficit present.     Mental Status: He is alert and oriented to person, place, and time.  Psychiatric:        Mood and Affect: Mood normal.        Behavior: Behavior normal.      LABORATORY DATA:  I have reviewed the labs as listed.  CBC Latest Ref Rng & Units 12/05/2020 11/28/2020 11/21/2020  WBC 4.0 - 10.5 K/uL 4.3 7.3 14.1(H)  Hemoglobin 13.0 - 17.0 g/dL 7.1(L) 7.1(L) 7.6(L)  Hematocrit 39.0 - 52.0 % 23.0(L) 22.5(L) 24.1(L)  Platelets 150 - 400 K/uL 108(L) 314 262   CMP Latest Ref Rng & Units 12/05/2020 11/28/2020 11/21/2020  Glucose 70 - 99 mg/dL 120(H) 124(H) 106(H)  BUN 8 - 23 mg/dL 30(H) 39(H) 12  Creatinine 0.61 - 1.24 mg/dL 1.02 1.24 1.06  Sodium 135 - 145 mmol/L 133(L) 129(L) 131(L)  Potassium 3.5 - 5.1 mmol/L 3.7 4.7  4.0  Chloride 98 - 111 mmol/L 101 99 99  CO2 22 - 32 mmol/L $RemoveB'26 27 29  'FInaTMzF$ Calcium 8.9 - 10.3 mg/dL 7.6(L) 8.9 9.3  Total Protein 6.5 - 8.1 g/dL 7.4 9.5(H) 9.9(H)  Total Bilirubin 0.3 - 1.2 mg/dL 0.8 0.6 0.7  Alkaline Phos 38 - 126 U/L 97 81 58  AST 15 - 41 U/L 31 33 27  ALT 0 - 44 U/L 33 33 24    DIAGNOSTIC IMAGING:  I have independently reviewed the scans and discussed with the patient. DG Chest Port 1 View  Result Date: 11/15/2020 CLINICAL DATA:  Shortness of breath with cough EXAM: PORTABLE CHEST 1 VIEW COMPARISON:  Chest radiograph October 31, 2020 and chest CT October 30, 2020 FINDINGS: Increased opacity in the left base is concerning for pneumonia. There is underlying interstitial thickening throughout the lungs. The heart size and pulmonary vascularity are normal. No adenopathy. Lytic bone lesions are seen throughout the chest. IMPRESSION: Left base opacity concerning for acute pneumonia. Underlying probable degree of bronchitis. Heart size normal. Widespread lytic bony lesions, an appearance that may well be indicative of multiple myeloma. Widespread metastatic disease could also present in this manner. Electronically Signed   By: Lowella Grip III M.D.   On: 11/15/2020 17:22     ASSESSMENT:  1. IgG kappa light chain multiple myeloma: -Admission with hypercalcemia and renal failure. -CT CAP showed extensive lytic foci throughout  the axial and appendicular skeleton of the chest, abdomen and pelvis. Splenomegaly with an enlarged lobular spleen, nonspecific. -Bone marrow biopsy on 11/01/2020 shows 85% atypical plasma cells in the aspirate. -FISH panel positive for 1 p-,-13,14q-,16q-,17p-/-17 and 20q- -Cytogenetics-no metaphases available for analysis. -SPEP with 5.4 g of M spike. Free light chain ratio 1030. LDH normal. Beta-2 microglobulin 18.7. 24-hour urine total protein 3.6 g. - Dara VRD started on 11/21/2020.  Revlimid 15 mg 2 weeks on 1 week off started on 11/22/2020.  2. 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 -Mother with breast and ovarian cancer. Father had metastatic kidney cancer. His brother's daughter has CML.   PLAN:  1.Stage IIIIgG kappa light chain multiple myeloma, high risk: - He has completed first cycle of Dara VRD. - He did not experience any major side effects.  His systolic blood pressure is 155 today.  He is not taking any blood pressure medication.  He reports systolic blood pressure at home is between 120 and 130.  No tingling or numbness in the extremities. - He will start Revlimid 15 mg 2 weeks on 1 week off today. - If his renal function continues to be good, we will plan to increase Revlimid dose. - Reviewed his CBC which showed hemoglobin improved to 8.8.  LFTs are normal. - Proceed with cycle 2 of Dara VRD today.  RTC 3 weeks for follow-up. - We will follow-up on myeloma panel from today. - Also talked to him about bone marrow transplant given his high risk disease.  He would like to think about it and will let us know next week where to make the referral.  2. Hypomagnesemia: - He had low magnesium in the past.  We will check magnesium levels at next visit.  3. Elevated carbon monoxide levels: - He had previously increased carboxyhemoglobin levels.  Oxygen saturation is 100% today on room air. - His breathing is better.  No further  testing needed.  4.  ID prophylaxis: - He did not start acyclovir as he is  afraid of the side effects. - I have reassured him and told him to start taking acyclovir twice daily.  Continue aspirin 81 mg daily.  5.  Myeloma bone disease: - Calcium today is 8.3. - We will plan to start him on denosumab soon.   Orders placed this encounter:  No orders of the defined types were placed in this encounter.    Derek Jack, MD [place] [phone]   I, Thana Ates, am acting as a scribe for Dr. Derek Jack.  I, Derek Jack MD, have reviewed the above documentation for accuracy and completeness, and I agree with the above.

## 2020-12-13 ENCOUNTER — Inpatient Hospital Stay (HOSPITAL_COMMUNITY): Payer: Commercial Managed Care - PPO

## 2020-12-13 ENCOUNTER — Inpatient Hospital Stay (HOSPITAL_BASED_OUTPATIENT_CLINIC_OR_DEPARTMENT_OTHER): Payer: Commercial Managed Care - PPO | Admitting: Hematology

## 2020-12-13 ENCOUNTER — Encounter (HOSPITAL_COMMUNITY): Payer: Self-pay | Admitting: Hematology

## 2020-12-13 ENCOUNTER — Other Ambulatory Visit: Payer: Self-pay

## 2020-12-13 VITALS — HR 82 | Temp 97.0°F | Resp 18 | Wt 159.9 lb

## 2020-12-13 DIAGNOSIS — C9000 Multiple myeloma not having achieved remission: Secondary | ICD-10-CM

## 2020-12-13 DIAGNOSIS — Z5112 Encounter for antineoplastic immunotherapy: Secondary | ICD-10-CM | POA: Diagnosis not present

## 2020-12-13 LAB — COMPREHENSIVE METABOLIC PANEL
ALT: 18 U/L (ref 0–44)
AST: 22 U/L (ref 15–41)
Albumin: 2.9 g/dL — ABNORMAL LOW (ref 3.5–5.0)
Alkaline Phosphatase: 125 U/L (ref 38–126)
Anion gap: 5 (ref 5–15)
BUN: 26 mg/dL — ABNORMAL HIGH (ref 8–23)
CO2: 28 mmol/L (ref 22–32)
Calcium: 8.3 mg/dL — ABNORMAL LOW (ref 8.9–10.3)
Chloride: 103 mmol/L (ref 98–111)
Creatinine, Ser: 0.93 mg/dL (ref 0.61–1.24)
GFR, Estimated: >60 mL/min (ref >60)
Glucose, Bld: 130 mg/dL — ABNORMAL HIGH (ref 70–99)
Potassium: 4.4 mmol/L (ref 3.5–5.1)
Sodium: 136 mmol/L (ref 135–145)
Total Bilirubin: 0.6 mg/dL (ref 0.3–1.2)
Total Protein: 6.7 g/dL (ref 6.5–8.1)

## 2020-12-13 LAB — CBC WITH DIFFERENTIAL/PLATELET
Abs Immature Granulocytes: 0.04 10*3/uL (ref 0.00–0.07)
Basophils Absolute: 0 10*3/uL (ref 0.0–0.1)
Basophils Relative: 0 %
Eosinophils Absolute: 0.1 10*3/uL (ref 0.0–0.5)
Eosinophils Relative: 1 %
HCT: 28.4 % — ABNORMAL LOW (ref 39.0–52.0)
Hemoglobin: 8.8 g/dL — ABNORMAL LOW (ref 13.0–17.0)
Immature Granulocytes: 1 %
Lymphocytes Relative: 34 %
Lymphs Abs: 1.7 10*3/uL (ref 0.7–4.0)
MCH: 31.2 pg (ref 26.0–34.0)
MCHC: 31 g/dL (ref 30.0–36.0)
MCV: 100.7 fL — ABNORMAL HIGH (ref 80.0–100.0)
Monocytes Absolute: 0.9 10*3/uL (ref 0.1–1.0)
Monocytes Relative: 17 %
Neutro Abs: 2.4 10*3/uL (ref 1.7–7.7)
Neutrophils Relative %: 47 %
Platelets: 184 10*3/uL (ref 150–400)
RBC: 2.82 MIL/uL — ABNORMAL LOW (ref 4.22–5.81)
RDW: 19.9 % — ABNORMAL HIGH (ref 11.5–15.5)
WBC: 5.1 10*3/uL (ref 4.0–10.5)
nRBC: 0 % (ref 0.0–0.2)

## 2020-12-13 MED ORDER — DEXAMETHASONE 4 MG PO TABS
40.0000 mg | ORAL_TABLET | Freq: Once | ORAL | Status: AC
  Administered 2020-12-13: 40 mg via ORAL
  Filled 2020-12-13: qty 10

## 2020-12-13 MED ORDER — BORTEZOMIB CHEMO SQ INJECTION 3.5 MG (2.5MG/ML)
1.3000 mg/m2 | Freq: Once | INTRAMUSCULAR | Status: AC
Start: 2020-12-13 — End: 2020-12-13
  Administered 2020-12-13: 2.5 mg via SUBCUTANEOUS
  Filled 2020-12-13: qty 1

## 2020-12-13 MED ORDER — DIPHENHYDRAMINE HCL 25 MG PO CAPS
50.0000 mg | ORAL_CAPSULE | Freq: Once | ORAL | Status: AC
Start: 2020-12-13 — End: 2020-12-13
  Administered 2020-12-13: 50 mg via ORAL
  Filled 2020-12-13: qty 2

## 2020-12-13 MED ORDER — ACETAMINOPHEN 325 MG PO TABS
650.0000 mg | ORAL_TABLET | Freq: Once | ORAL | Status: AC
  Administered 2020-12-13: 650 mg via ORAL
  Filled 2020-12-13: qty 2

## 2020-12-13 MED ORDER — DARATUMUMAB-HYALURONIDASE-FIHJ 1800-30000 MG-UT/15ML ~~LOC~~ SOLN
1800.0000 mg | Freq: Once | SUBCUTANEOUS | Status: AC
  Administered 2020-12-13: 1800 mg via SUBCUTANEOUS
  Filled 2020-12-13: qty 15

## 2020-12-13 NOTE — Patient Instructions (Signed)
[facility]  Discharge Instructions: Thank you for choosing [facility] to provide your oncology and hematology care.  If you have a lab appointment with the Cancer Center, please come in thru the Main Entrance and check in at the main information desk.  Wear comfortable clothing and clothing appropriate for easy access to any Portacath or PICC line.   We strive to give you quality time with your provider. You may need to reschedule your appointment if you arrive late (15 or more minutes).  Arriving late affects you and other patients whose appointments are after yours.  Also, if you miss three or more appointments without notifying the office, you may be dismissed from the clinic at the provider's discretion.      For prescription refill requests, have your pharmacy contact our office and allow 72 hours for refills to be completed.        To help prevent nausea and vomiting after your treatment, we encourage you to take your nausea medication as directed.  BELOW ARE SYMPTOMS THAT SHOULD BE REPORTED IMMEDIATELY: *FEVER GREATER THAN 100.4 F (38 C) OR HIGHER *CHILLS OR SWEATING *NAUSEA AND VOMITING THAT IS NOT CONTROLLED WITH YOUR NAUSEA MEDICATION *UNUSUAL SHORTNESS OF BREATH *UNUSUAL BRUISING OR BLEEDING *URINARY PROBLEMS (pain or burning when urinating, or frequent urination) *BOWEL PROBLEMS (unusual diarrhea, constipation, pain near the anus) TENDERNESS IN MOUTH AND THROAT WITH OR WITHOUT PRESENCE OF ULCERS (sore throat, sores in mouth, or a toothache) UNUSUAL RASH, SWELLING OR PAIN  UNUSUAL VAGINAL DISCHARGE OR ITCHING   Items with * indicate a potential emergency and should be followed up as soon as possible or go to the Emergency Department if any problems should occur.  Please show the CHEMOTHERAPY ALERT CARD or IMMUNOTHERAPY ALERT CARD at check-in to the Emergency Department and triage nurse.  Should you have questions after your visit or need to cancel  or reschedule your appointment, please contact [facility] [phone]  and follow the prompts.  Office hours are 8:00 a.m. to 4:30 p.m. Monday - Friday. Please note that voicemails left after 4:00 p.m. may not be returned until the following business day.  We are closed weekends and major holidays. You have access to a nurse at all times for urgent questions. Please call the main number to the clinic [phone] and follow the prompts.  For any non-urgent questions, you may also contact your provider using MyChart. We now offer e-Visits for anyone 18 and older to request care online for non-urgent symptoms. For details visit [email or web address].   Also download the MyChart app! Go to the app store, search "MyChart", open the app, select [place], and log in with your MyChart username and password.  Due to Covid, a mask is required upon entering the hospital/clinic. If you do not have a mask, one will be given to you upon arrival. For doctor visits, patients may have 1 support person aged 18 or older with them. For treatment visits, patients cannot have anyone with them due to current Covid guidelines and our immunocompromised population.  

## 2020-12-13 NOTE — Progress Notes (Signed)
Labs reviewed with MD today. Proceed with treatment per MD.   Treatment given per orders. Patient tolerated it well without problems. Vitals stable and discharged home from clinic ambulatory. Follow up as scheduled.  

## 2020-12-13 NOTE — Patient Instructions (Signed)
[place] at [facility] Discharge Instructions  You were seen today by Dr. Delton Coombes. He went over your recent results, and you received your treatment today. Dr. Delton Coombes will see you back in 3 weeks for labs and follow up.   Thank you for choosing [place] at [facility] to provide your oncology and hematology care.  To afford each patient quality time with our provider, please arrive at least 15 minutes before your scheduled appointment time.   If you have a lab appointment with the [facility] please come in thru the Main Entrance and check in at the main information desk  You need to re-schedule your appointment should you arrive 10 or more minutes late.  We strive to give you quality time with our providers, and arriving late affects you and other patients whose appointments are after yours.  Also, if you no show three or more times for appointments you may be dismissed from the clinic at the providers discretion.     Again, thank you for choosing [facility].  Our hope is that these requests will decrease the amount of time that you wait before being seen by our physicians.       _____________________________________________________________  Should you have questions after your visit to [facility], please contact our office at [phone] between the hours of 8:00 a.m. and 4:30 p.m.  Voicemails left after 4:00 p.m. will not be returned until the following business day.  For prescription refill requests, have your pharmacy contact our office and allow 72 hours.    Cancer Center Support Programs:   > Cancer Support Group  2nd Tuesday of the month 1pm-2pm, Journey Room

## 2020-12-13 NOTE — Progress Notes (Signed)
Patient has been assessed, vital signs and labs have been reviewed by Dr. Katragadda. ANC, Creatinine, LFTs, and Platelets are within treatment parameters per Dr. Katragadda. The patient is good to proceed with treatment at this time. Primary RN and pharmacy aware.  

## 2020-12-14 ENCOUNTER — Encounter (HOSPITAL_COMMUNITY): Payer: Self-pay | Admitting: Hematology

## 2020-12-14 LAB — KAPPA/LAMBDA LIGHT CHAINS
Kappa free light chain: 372.6 mg/L — ABNORMAL HIGH (ref 3.3–19.4)
Kappa, lambda light chain ratio: 35.83 — ABNORMAL HIGH (ref 0.26–1.65)
Lambda free light chains: 10.4 mg/L (ref 5.7–26.3)

## 2020-12-15 ENCOUNTER — Ambulatory Visit (HOSPITAL_COMMUNITY): Payer: Commercial Managed Care - PPO | Admitting: Dietician

## 2020-12-15 NOTE — Progress Notes (Signed)
Nutrition Follow-up:  Patient with myeloma transplant candidate. He is receiving chemotherapy with DaraBorD  Spoke with sister via telephone. She reports patient has a great appetite and eating very well. He really likes the Ensure supplements, says he has already consumed 3 this morning along with 2 bowls of cereal for breakfast. Sister reports following up on Ensure ordered by PCP for pharmacy pick up and cost was $67 per case. Sister is asking for additional sample case of vouchers for supplement.   Medications: reviewed  Labs: reviewed  Anthropometrics: Weight 159 lb 14.4 oz on 5/31 increased 154 lb 6.4 oz on 5/23 and 151 lb 9.6 oz on 5/16  5/9 - 152 lb 1.9 oz 5/2 - 148 lb 6.4 oz 4/27 - 152 lb 8.9 oz   NUTRITION DIAGNOSIS: Inadequate oral intake improved    INTERVENTION:  Continue eating high calorie, high protein foods to promote weight gain Recommend limiting intake of Ensure Plus (1-2/day) given increased appetite and food intake Will mail Ensure coupons  Will leave complimentary case per request for pick up on 6/14 Contact information has been provided     MONITORING, EVALUATION, GOAL: weight trends, intake   NEXT VISIT: via telephone ~4-5 weeks

## 2020-12-16 LAB — MULTIPLE MYELOMA PANEL, SERUM
Albumin SerPl Elph-Mcnc: 3.2 g/dL (ref 2.9–4.4)
Albumin/Glob SerPl: 1 (ref 0.7–1.7)
Alpha 1: 0.3 g/dL (ref 0.0–0.4)
Alpha2 Glob SerPl Elph-Mcnc: 0.7 g/dL (ref 0.4–1.0)
B-Globulin SerPl Elph-Mcnc: 0.9 g/dL (ref 0.7–1.3)
Gamma Glob SerPl Elph-Mcnc: 1.7 g/dL (ref 0.4–1.8)
Globulin, Total: 3.5 g/dL (ref 2.2–3.9)
IgA: 32 mg/dL — ABNORMAL LOW (ref 61–437)
IgG (Immunoglobin G), Serum: 2075 mg/dL — ABNORMAL HIGH (ref 603–1613)
IgM (Immunoglobulin M), Srm: 30 mg/dL (ref 20–172)
M Protein SerPl Elph-Mcnc: 1.5 g/dL — ABNORMAL HIGH
Total Protein ELP: 6.7 g/dL (ref 6.0–8.5)

## 2020-12-19 ENCOUNTER — Other Ambulatory Visit (HOSPITAL_COMMUNITY): Payer: Self-pay

## 2020-12-19 MED ORDER — LENALIDOMIDE 15 MG PO CAPS: 15.0000 mg | ORAL_CAPSULE | Freq: Every day | ORAL | 0 refills | Status: DC

## 2020-12-19 NOTE — Telephone Encounter (Signed)
Chart reviewed. Revlimid refilled per Dr. Delton Coombes

## 2020-12-20 ENCOUNTER — Other Ambulatory Visit: Payer: Self-pay

## 2020-12-20 ENCOUNTER — Inpatient Hospital Stay (HOSPITAL_COMMUNITY): Payer: Commercial Managed Care - PPO | Attending: Hematology

## 2020-12-20 ENCOUNTER — Inpatient Hospital Stay (HOSPITAL_COMMUNITY): Payer: Commercial Managed Care - PPO

## 2020-12-20 VITALS — BP 132/52 | HR 60 | Temp 97.0°F | Resp 18 | Wt 163.2 lb

## 2020-12-20 DIAGNOSIS — Z79899 Other long term (current) drug therapy: Secondary | ICD-10-CM | POA: Diagnosis not present

## 2020-12-20 DIAGNOSIS — C9000 Multiple myeloma not having achieved remission: Secondary | ICD-10-CM | POA: Insufficient documentation

## 2020-12-20 DIAGNOSIS — Z5112 Encounter for antineoplastic immunotherapy: Secondary | ICD-10-CM | POA: Diagnosis present

## 2020-12-20 LAB — COMPREHENSIVE METABOLIC PANEL
ALT: 20 U/L (ref 0–44)
AST: 24 U/L (ref 15–41)
Albumin: 3.1 g/dL — ABNORMAL LOW (ref 3.5–5.0)
Alkaline Phosphatase: 126 U/L (ref 38–126)
Anion gap: 6 (ref 5–15)
BUN: 23 mg/dL (ref 8–23)
CO2: 28 mmol/L (ref 22–32)
Calcium: 7.9 mg/dL — ABNORMAL LOW (ref 8.9–10.3)
Chloride: 104 mmol/L (ref 98–111)
Creatinine, Ser: 0.98 mg/dL (ref 0.61–1.24)
GFR, Estimated: >60 mL/min (ref >60)
Glucose, Bld: 126 mg/dL — ABNORMAL HIGH (ref 70–99)
Potassium: 4.4 mmol/L (ref 3.5–5.1)
Sodium: 138 mmol/L (ref 135–145)
Total Bilirubin: 0.7 mg/dL (ref 0.3–1.2)
Total Protein: 6.3 g/dL — ABNORMAL LOW (ref 6.5–8.1)

## 2020-12-20 LAB — CBC WITH DIFFERENTIAL/PLATELET
Abs Immature Granulocytes: 0.02 10*3/uL (ref 0.00–0.07)
Basophils Absolute: 0 10*3/uL (ref 0.0–0.1)
Basophils Relative: 0 %
Eosinophils Absolute: 0.2 10*3/uL (ref 0.0–0.5)
Eosinophils Relative: 3 %
HCT: 30.7 % — ABNORMAL LOW (ref 39.0–52.0)
Hemoglobin: 9.5 g/dL — ABNORMAL LOW (ref 13.0–17.0)
Immature Granulocytes: 0 %
Lymphocytes Relative: 23 %
Lymphs Abs: 1.4 10*3/uL (ref 0.7–4.0)
MCH: 31 pg (ref 26.0–34.0)
MCHC: 30.9 g/dL (ref 30.0–36.0)
MCV: 100.3 fL — ABNORMAL HIGH (ref 80.0–100.0)
Monocytes Absolute: 0.6 10*3/uL (ref 0.1–1.0)
Monocytes Relative: 10 %
Neutro Abs: 3.9 10*3/uL (ref 1.7–7.7)
Neutrophils Relative %: 64 %
Platelets: 182 10*3/uL (ref 150–400)
RBC: 3.06 MIL/uL — ABNORMAL LOW (ref 4.22–5.81)
RDW: 18.6 % — ABNORMAL HIGH (ref 11.5–15.5)
WBC: 6.1 10*3/uL (ref 4.0–10.5)
nRBC: 0 % (ref 0.0–0.2)

## 2020-12-20 MED ORDER — BORTEZOMIB CHEMO SQ INJECTION 3.5 MG (2.5MG/ML)
1.3000 mg/m2 | Freq: Once | INTRAMUSCULAR | Status: AC
  Administered 2020-12-20: 2.5 mg via SUBCUTANEOUS
  Filled 2020-12-20: qty 1

## 2020-12-20 MED ORDER — DEXAMETHASONE 4 MG PO TABS
40.0000 mg | ORAL_TABLET | Freq: Once | ORAL | Status: AC
  Administered 2020-12-20: 40 mg via ORAL

## 2020-12-20 MED ORDER — DARATUMUMAB-HYALURONIDASE-FIHJ 1800-30000 MG-UT/15ML ~~LOC~~ SOLN
1800.0000 mg | Freq: Once | SUBCUTANEOUS | Status: AC
  Administered 2020-12-20: 1800 mg via SUBCUTANEOUS
  Filled 2020-12-20: qty 15

## 2020-12-20 MED ORDER — DEXAMETHASONE 4 MG PO TABS
ORAL_TABLET | ORAL | Status: AC
  Filled 2020-12-20: qty 10

## 2020-12-20 MED ORDER — DIPHENHYDRAMINE HCL 25 MG PO CAPS
ORAL_CAPSULE | ORAL | Status: AC
  Filled 2020-12-20: qty 2

## 2020-12-20 MED ORDER — ACETAMINOPHEN 325 MG PO TABS
ORAL_TABLET | ORAL | Status: AC
  Filled 2020-12-20: qty 2

## 2020-12-20 MED ORDER — ACETAMINOPHEN 325 MG PO TABS
650.0000 mg | ORAL_TABLET | Freq: Once | ORAL | Status: AC
  Administered 2020-12-20: 650 mg via ORAL

## 2020-12-20 MED ORDER — DIPHENHYDRAMINE HCL 25 MG PO CAPS
50.0000 mg | ORAL_CAPSULE | Freq: Once | ORAL | Status: AC
  Administered 2020-12-20: 50 mg via ORAL

## 2020-12-20 NOTE — Progress Notes (Signed)
The following Assist/Replace Program for Darzalex FasPro from [employer] has been terminated due to Patient has active [facility] coverage .  Last DOS: 12/13/2020.  The following Assist/Replace Program for Velcade from velcade Remb has been terminated due to Patient has active [facility] coverage .  Last DOS: 12/13/2020.  Madalyn Rob, CPhT IV Drug Replacement Specialist  [place] Phone: [phone]

## 2020-12-20 NOTE — Patient Instructions (Signed)
[facility]  Discharge Instructions: Thank you for choosing [facility] to provide your oncology and hematology care.  If you have a lab appointment with the [facility], please come in thru the Main Entrance and check in at the main information desk.  Wear comfortable clothing and clothing appropriate for easy access to any Portacath or PICC line.   We strive to give you quality time with your provider. You may need to reschedule your appointment if you arrive late (15 or more minutes).  Arriving late affects you and other patients whose appointments are after yours.  Also, if you miss three or more appointments without notifying the office, you may be dismissed from the clinic at the provider's discretion.      For prescription refill requests, have your pharmacy contact our office and allow 72 hours for refills to be completed.    Today you received the following chemotherapy and/or immunotherapy agents Velcade and Daratumumab injections.    To help prevent nausea and vomiting after your treatment, we encourage you to take your nausea medication as directed.  BELOW ARE SYMPTOMS THAT SHOULD BE REPORTED IMMEDIATELY: . *FEVER GREATER THAN 100.4 F (38 C) OR HIGHER . *CHILLS OR SWEATING . *NAUSEA AND VOMITING THAT IS NOT CONTROLLED WITH YOUR NAUSEA MEDICATION . *UNUSUAL SHORTNESS OF BREATH . *UNUSUAL BRUISING OR BLEEDING . *URINARY PROBLEMS (pain or burning when urinating, or frequent urination) . *BOWEL PROBLEMS (unusual diarrhea, constipation, pain near the anus) . TENDERNESS IN MOUTH AND THROAT WITH OR WITHOUT PRESENCE OF ULCERS (sore throat, sores in mouth, or a toothache) . UNUSUAL RASH, SWELLING OR PAIN  . UNUSUAL VAGINAL DISCHARGE OR ITCHING   Items with * indicate a potential emergency and should be followed up as soon as possible or go to the Emergency Department if any problems should occur.  Please show the CHEMOTHERAPY ALERT CARD or IMMUNOTHERAPY  ALERT CARD at check-in to the Emergency Department and triage nurse.  Should you have questions after your visit or need to cancel or reschedule your appointment, please contact [facility] [phone]  and follow the prompts.  Office hours are 8:00 a.m. to 4:30 p.m. Monday - Friday. Please note that voicemails left after 4:00 p.m. may not be returned until the following business day.  We are closed weekends and major holidays. You have access to a nurse at all times for urgent questions. Please call the main number to the clinic [phone] and follow the prompts.  For any non-urgent questions, you may also contact your provider using MyChart. We now offer e-Visits for anyone 64 and older to request care online for non-urgent symptoms. For details visit [email or web address].   Also download the MyChart app! Go to the app store, search "MyChart", open the app, select [place], and log in with your MyChart username and password.  Due to Covid, a mask is required upon entering the hospital/clinic. If you do not have a mask, one will be given to you upon arrival. For doctor visits, patients may have 1 support person aged 64 or older with them. For treatment visits, patients cannot have anyone with them due to current Covid guidelines and our immunocompromised population.

## 2020-12-20 NOTE — Progress Notes (Signed)
Patient presents today for Velcade and Daratumumab injections per MD order.  Vital signs within parameters for treatment.  Labs pending.  Patient has no new complaints since last visit.    Labs reviewed and within parameters for treatment.  Stable during Velcade and Daratumumab administration without incident; injection site WNL; see MAR for injection details.  Patient tolerated procedure well and without incident.  No questions or complaints noted at this time.  Vital signs stable. Discharge from clinic ambulatory in stable condition.  Alert and oriented X 3.  Follow up with [facility] as scheduled.

## 2020-12-27 ENCOUNTER — Other Ambulatory Visit: Payer: Self-pay

## 2020-12-27 ENCOUNTER — Encounter (HOSPITAL_COMMUNITY): Payer: Self-pay

## 2020-12-27 ENCOUNTER — Inpatient Hospital Stay (HOSPITAL_COMMUNITY): Payer: Commercial Managed Care - PPO

## 2020-12-27 VITALS — BP 142/66 | HR 69 | Temp 96.7°F | Resp 18 | Wt 166.7 lb

## 2020-12-27 DIAGNOSIS — C9000 Multiple myeloma not having achieved remission: Secondary | ICD-10-CM

## 2020-12-27 DIAGNOSIS — Z5112 Encounter for antineoplastic immunotherapy: Secondary | ICD-10-CM | POA: Diagnosis not present

## 2020-12-27 LAB — CBC WITH DIFFERENTIAL/PLATELET
Abs Immature Granulocytes: 0.05 10*3/uL (ref 0.00–0.07)
Basophils Absolute: 0.1 10*3/uL (ref 0.0–0.1)
Basophils Relative: 1 %
Eosinophils Absolute: 0.1 10*3/uL (ref 0.0–0.5)
Eosinophils Relative: 1 %
HCT: 32.9 % — ABNORMAL LOW (ref 39.0–52.0)
Hemoglobin: 10.1 g/dL — ABNORMAL LOW (ref 13.0–17.0)
Immature Granulocytes: 1 %
Lymphocytes Relative: 15 %
Lymphs Abs: 1.4 10*3/uL (ref 0.7–4.0)
MCH: 30.7 pg (ref 26.0–34.0)
MCHC: 30.7 g/dL (ref 30.0–36.0)
MCV: 100 fL (ref 80.0–100.0)
Monocytes Absolute: 1.4 10*3/uL — ABNORMAL HIGH (ref 0.1–1.0)
Monocytes Relative: 15 %
Neutro Abs: 6.4 10*3/uL (ref 1.7–7.7)
Neutrophils Relative %: 67 %
Platelets: 203 10*3/uL (ref 150–400)
RBC: 3.29 MIL/uL — ABNORMAL LOW (ref 4.22–5.81)
RDW: 17.3 % — ABNORMAL HIGH (ref 11.5–15.5)
WBC: 9.4 10*3/uL (ref 4.0–10.5)
nRBC: 0 % (ref 0.0–0.2)

## 2020-12-27 LAB — COMPREHENSIVE METABOLIC PANEL
ALT: 18 U/L (ref 0–44)
AST: 21 U/L (ref 15–41)
Albumin: 3.3 g/dL — ABNORMAL LOW (ref 3.5–5.0)
Alkaline Phosphatase: 112 U/L (ref 38–126)
Anion gap: 7 (ref 5–15)
BUN: 25 mg/dL — ABNORMAL HIGH (ref 8–23)
CO2: 27 mmol/L (ref 22–32)
Calcium: 7.9 mg/dL — ABNORMAL LOW (ref 8.9–10.3)
Chloride: 104 mmol/L (ref 98–111)
Creatinine, Ser: 0.96 mg/dL (ref 0.61–1.24)
GFR, Estimated: >60 mL/min (ref >60)
Glucose, Bld: 105 mg/dL — ABNORMAL HIGH (ref 70–99)
Potassium: 4.2 mmol/L (ref 3.5–5.1)
Sodium: 138 mmol/L (ref 135–145)
Total Bilirubin: 0.9 mg/dL (ref 0.3–1.2)
Total Protein: 5.9 g/dL — ABNORMAL LOW (ref 6.5–8.1)

## 2020-12-27 LAB — MAGNESIUM: Magnesium: 1.8 mg/dL (ref 1.7–2.4)

## 2020-12-27 MED ORDER — DEXAMETHASONE 4 MG PO TABS
40.0000 mg | ORAL_TABLET | Freq: Once | ORAL | Status: AC
  Administered 2020-12-27: 40 mg via ORAL
  Filled 2020-12-27: qty 10

## 2020-12-27 MED ORDER — ACETAMINOPHEN 325 MG PO TABS
650.0000 mg | ORAL_TABLET | Freq: Once | ORAL | Status: AC
Start: 2020-12-27 — End: 2020-12-27
  Administered 2020-12-27: 650 mg via ORAL
  Filled 2020-12-27: qty 2

## 2020-12-27 MED ORDER — BORTEZOMIB CHEMO SQ INJECTION 3.5 MG (2.5MG/ML)
1.3000 mg/m2 | Freq: Once | INTRAMUSCULAR | Status: AC
  Administered 2020-12-27: 2.5 mg via SUBCUTANEOUS
  Filled 2020-12-27: qty 1

## 2020-12-27 MED ORDER — DIPHENHYDRAMINE HCL 25 MG PO CAPS
50.0000 mg | ORAL_CAPSULE | Freq: Once | ORAL | Status: AC
Start: 2020-12-27 — End: 2020-12-27
  Administered 2020-12-27: 50 mg via ORAL
  Filled 2020-12-27: qty 2

## 2020-12-27 MED ORDER — DARATUMUMAB-HYALURONIDASE-FIHJ 1800-30000 MG-UT/15ML ~~LOC~~ SOLN
1800.0000 mg | Freq: Once | SUBCUTANEOUS | Status: AC
Start: 2020-12-27 — End: 2020-12-27
  Administered 2020-12-27: 1800 mg via SUBCUTANEOUS
  Filled 2020-12-27: qty 15

## 2020-12-27 NOTE — Patient Instructions (Signed)
[facility]  Discharge Instructions: Thank you for choosing [facility] to provide your oncology and hematology care.  If you have a lab appointment with the [facility], please come in thru the Main Entrance and check in at the main information desk.  Wear comfortable clothing and clothing appropriate for easy access to any Portacath or PICC line.   We strive to give you quality time with your provider. You may need to reschedule your appointment if you arrive late (15 or more minutes).  Arriving late affects you and other patients whose appointments are after yours.  Also, if you miss three or more appointments without notifying the office, you may be dismissed from the clinic at the provider's discretion.      For prescription refill requests, have your pharmacy contact our office and allow 72 hours for refills to be completed.    Today you received the following chemotherapy and/or immunotherapy agents: Dara and Velcade   To help prevent nausea and vomiting after your treatment, we encourage you to take your nausea medication as directed.  BELOW ARE SYMPTOMS THAT SHOULD BE REPORTED IMMEDIATELY: *FEVER GREATER THAN 100.4 F (38 C) OR HIGHER *CHILLS OR SWEATING *NAUSEA AND VOMITING THAT IS NOT CONTROLLED WITH YOUR NAUSEA MEDICATION *UNUSUAL SHORTNESS OF BREATH *UNUSUAL BRUISING OR BLEEDING *URINARY PROBLEMS (pain or burning when urinating, or frequent urination) *BOWEL PROBLEMS (unusual diarrhea, constipation, pain near the anus) TENDERNESS IN MOUTH AND THROAT WITH OR WITHOUT PRESENCE OF ULCERS (sore throat, sores in mouth, or a toothache) UNUSUAL RASH, SWELLING OR PAIN  UNUSUAL VAGINAL DISCHARGE OR ITCHING   Items with * indicate a potential emergency and should be followed up as soon as possible or go to the Emergency Department if any problems should occur.  Please show the CHEMOTHERAPY ALERT CARD or IMMUNOTHERAPY ALERT CARD at check-in to the Emergency  Department and triage nurse.  Should you have questions after your visit or need to cancel or reschedule your appointment, please contact [facility] [phone]  and follow the prompts.  Office hours are 8:00 a.m. to 4:30 p.m. Monday - Friday. Please note that voicemails left after 4:00 p.m. may not be returned until the following business day.  We are closed weekends and major holidays. You have access to a nurse at all times for urgent questions. Please call the main number to the clinic [phone] and follow the prompts.  For any non-urgent questions, you may also contact your provider using MyChart. We now offer e-Visits for anyone 82 and older to request care online for non-urgent symptoms. For details visit [email or web address].   Also download the MyChart app! Go to the app store, search "MyChart", open the app, select [facility], and log in with your MyChart username and password.  Due to Covid, a mask is required upon entering the hospital/clinic. If you do not have a mask, one will be given to you upon arrival. For doctor visits, patients may have 1 support person aged 9 or older with them. For treatment visits, patients cannot have anyone with them due to current Covid guidelines and our immunocompromised population.

## 2020-12-27 NOTE — Progress Notes (Signed)
Patient tolerated Velcade and Dara injection with no complaints voiced. Site clean and dry with no bruising or swelling noted at site. See MAR for details. Band aid applied.  Patient stable during and after injection. VSS with discharge and left in satisfactory condition with no s/s of distress noted.

## 2020-12-28 LAB — PROTEIN ELECTROPHORESIS, SERUM
A/G Ratio: 1.4 (ref 0.7–1.7)
Albumin ELP: 3.3 g/dL (ref 2.9–4.4)
Alpha-1-Globulin: 0.2 g/dL (ref 0.0–0.4)
Alpha-2-Globulin: 0.5 g/dL (ref 0.4–1.0)
Beta Globulin: 0.8 g/dL (ref 0.7–1.3)
Gamma Globulin: 0.7 g/dL (ref 0.4–1.8)
Globulin, Total: 2.3 g/dL (ref 2.2–3.9)
M-Spike, %: 0.6 g/dL — ABNORMAL HIGH
Total Protein ELP: 5.6 g/dL — ABNORMAL LOW (ref 6.0–8.5)

## 2020-12-28 LAB — KAPPA/LAMBDA LIGHT CHAINS
Kappa free light chain: 59.9 mg/L — ABNORMAL HIGH (ref 3.3–19.4)
Kappa, lambda light chain ratio: 6.11 — ABNORMAL HIGH (ref 0.26–1.65)
Lambda free light chains: 9.8 mg/L (ref 5.7–26.3)

## 2021-01-03 ENCOUNTER — Inpatient Hospital Stay (HOSPITAL_COMMUNITY): Payer: Commercial Managed Care - PPO

## 2021-01-03 ENCOUNTER — Inpatient Hospital Stay (HOSPITAL_BASED_OUTPATIENT_CLINIC_OR_DEPARTMENT_OTHER): Payer: Commercial Managed Care - PPO | Admitting: Hematology and Oncology

## 2021-01-03 ENCOUNTER — Encounter (HOSPITAL_COMMUNITY): Payer: Self-pay

## 2021-01-03 ENCOUNTER — Other Ambulatory Visit: Payer: Self-pay

## 2021-01-03 VITALS — BP 177/64 | HR 80 | Temp 97.0°F | Resp 18 | Wt 171.5 lb

## 2021-01-03 DIAGNOSIS — C9000 Multiple myeloma not having achieved remission: Secondary | ICD-10-CM

## 2021-01-03 DIAGNOSIS — Z5112 Encounter for antineoplastic immunotherapy: Secondary | ICD-10-CM | POA: Diagnosis not present

## 2021-01-03 LAB — CBC WITH DIFFERENTIAL/PLATELET
Abs Immature Granulocytes: 0.01 10*3/uL (ref 0.00–0.07)
Basophils Absolute: 0.1 10*3/uL (ref 0.0–0.1)
Basophils Relative: 1 %
Eosinophils Absolute: 0 10*3/uL (ref 0.0–0.5)
Eosinophils Relative: 1 %
HCT: 32.9 % — ABNORMAL LOW (ref 39.0–52.0)
Hemoglobin: 10.4 g/dL — ABNORMAL LOW (ref 13.0–17.0)
Immature Granulocytes: 0 %
Lymphocytes Relative: 18 %
Lymphs Abs: 1.2 10*3/uL (ref 0.7–4.0)
MCH: 30.6 pg (ref 26.0–34.0)
MCHC: 31.6 g/dL (ref 30.0–36.0)
MCV: 96.8 fL (ref 80.0–100.0)
Monocytes Absolute: 1 10*3/uL (ref 0.1–1.0)
Monocytes Relative: 15 %
Neutro Abs: 4.2 10*3/uL (ref 1.7–7.7)
Neutrophils Relative %: 65 %
Platelets: 200 10*3/uL (ref 150–400)
RBC: 3.4 MIL/uL — ABNORMAL LOW (ref 4.22–5.81)
RDW: 16.5 % — ABNORMAL HIGH (ref 11.5–15.5)
WBC: 6.4 10*3/uL (ref 4.0–10.5)
nRBC: 0 % (ref 0.0–0.2)

## 2021-01-03 LAB — COMPREHENSIVE METABOLIC PANEL
ALT: 14 U/L (ref 0–44)
AST: 20 U/L (ref 15–41)
Albumin: 3.4 g/dL — ABNORMAL LOW (ref 3.5–5.0)
Alkaline Phosphatase: 112 U/L (ref 38–126)
Anion gap: 6 (ref 5–15)
BUN: 24 mg/dL — ABNORMAL HIGH (ref 8–23)
CO2: 26 mmol/L (ref 22–32)
Calcium: 8.3 mg/dL — ABNORMAL LOW (ref 8.9–10.3)
Chloride: 106 mmol/L (ref 98–111)
Creatinine, Ser: 0.93 mg/dL (ref 0.61–1.24)
GFR, Estimated: >60 mL/min (ref >60)
Glucose, Bld: 128 mg/dL — ABNORMAL HIGH (ref 70–99)
Potassium: 4.1 mmol/L (ref 3.5–5.1)
Sodium: 138 mmol/L (ref 135–145)
Total Bilirubin: 0.7 mg/dL (ref 0.3–1.2)
Total Protein: 6.1 g/dL — ABNORMAL LOW (ref 6.5–8.1)

## 2021-01-03 MED ORDER — ACETAMINOPHEN 325 MG PO TABS
650.0000 mg | ORAL_TABLET | Freq: Once | ORAL | Status: AC
  Administered 2021-01-03: 650 mg via ORAL
  Filled 2021-01-03: qty 2

## 2021-01-03 MED ORDER — BORTEZOMIB CHEMO SQ INJECTION 3.5 MG (2.5MG/ML)
1.3000 mg/m2 | Freq: Once | INTRAMUSCULAR | Status: AC
  Administered 2021-01-03: 2.5 mg via SUBCUTANEOUS
  Filled 2021-01-03: qty 1

## 2021-01-03 MED ORDER — DEXAMETHASONE 4 MG PO TABS
40.0000 mg | ORAL_TABLET | Freq: Once | ORAL | Status: AC
  Administered 2021-01-03: 40 mg via ORAL
  Filled 2021-01-03: qty 10

## 2021-01-03 MED ORDER — DIPHENHYDRAMINE HCL 25 MG PO CAPS
50.0000 mg | ORAL_CAPSULE | Freq: Once | ORAL | Status: AC
  Administered 2021-01-03: 50 mg via ORAL
  Filled 2021-01-03: qty 2

## 2021-01-03 MED ORDER — DARATUMUMAB-HYALURONIDASE-FIHJ 1800-30000 MG-UT/15ML ~~LOC~~ SOLN
1800.0000 mg | Freq: Once | SUBCUTANEOUS | Status: AC
Start: 2021-01-03 — End: 2021-01-03
  Administered 2021-01-03: 1800 mg via SUBCUTANEOUS
  Filled 2021-01-03: qty 15

## 2021-01-03 NOTE — Progress Notes (Signed)
Labs reviewed today with Dr. Lorenso Courier, will proceed as planned.   Treatment given per orders. Patient tolerated it well without problems. Vitals stable and discharged home from clinic ambulatory, escorted by Nurse tech to person at main entrance to pick pt up.  Follow up as scheduled.

## 2021-01-03 NOTE — Progress Notes (Addendum)
Paul Norton, [place] [address]   CLINIC:  Medical Oncology/Hematology  PCP:  Paul Hurl, PA-C [address] / [place] [address] [phone]   REASON FOR VISIT:  Follow-up for multiple myeloma  PRIOR THERAPY: none  NGS Results: not done  CURRENT THERAPY: DaraBorD every 3 weeks; Revlimid 15 mg 2/3 weeks  BRIEF ONCOLOGIC HISTORY:  Oncology History  Multiple myeloma not having achieved remission ([facility])  11/01/2020 Initial Diagnosis   Multiple myeloma not having achieved remission ([facility])    11/02/2020 - 11/02/2020 Chemotherapy          11/21/2020 -  Chemotherapy    Patient is on Treatment Plan: MYELOMA NEWLY DIAGNOSED TRANSPLANT CANDIDATE DARAVRD (DARATUMUMAB SQ) Q21D X 6 CYCLES (INDUCTION/CONSOLIDATION)         CANCER STAGING: Cancer Staging No matching staging information was found for the patient.  INTERVAL HISTORY:  [name], a 65 y.o. male, returns for routine follow-up and consideration for next cycle of chemotherapy. Paul Norton was last seen on 12/13/2020.  Due for cycle #3 of DaraVRd today.   [name].  He reports that he tries to walk and get exercise on a daily basis.  He notes that his energy is quite good on most days.  He denies having issues with nausea, vomiting, or diarrhea.  He denies any fevers, chills, sweats.  He reports he does not have some occasional fatigue the day after he receives a shot but otherwise has been quite well.  He denies any numbness or tingling or other new symptoms.  He is willing and able to proceed with treatment today.  REVIEW OF SYSTEMS:  Review of Systems  Constitutional:  Positive for appetite change (75%) and fatigue (75%).  All other systems reviewed and are negative.  PAST MEDICAL/SURGICAL HISTORY:  Past Medical History:  Diagnosis Date   Broken leg    Colonoscopy refused 09/2017   Hyperlipidemia     Hypertension    Multiple myeloma ([facility])    Pneumonia    Refuses treatment 09/2017   refuses screenings such as cancer screening, refuses vaccines, refuses normal preventative care   Vaccine refused by patient    all vaccines as of 09/2017   Past Surgical History:  Procedure Laterality Date   COLONOSCOPY     never, declines as of 09/2017    SOCIAL HISTORY:  Social History   Socioeconomic History   Marital status: Divorced    Spouse name: Not on file   Number of children: 1   Years of education: Not on file   Highest education level: Not on file  Occupational History   Occupation: Disability  Tobacco Use   Smoking status: Never   Smokeless tobacco: Never  Substance and Sexual Activity   Alcohol use: No   Drug use: Never   Sexual activity: Not Currently  Other Topics Concern   Not on file  Social History Narrative   Not on file   Social Determinants of Health   Financial Resource Strain: Medium Risk   Difficulty of Paying Living Expenses: Somewhat hard  Food Insecurity: No Food Insecurity   Worried About Charity fundraiser in the Last Year: Never true   [place] in the Last Year: Never true  Transportation Needs: No Transportation Needs   Lack of Transportation (Medical): No   Lack of Transportation (Non-Medical): No  Physical Activity: Insufficiently Active  Days of Exercise per Week: 5 days   Minutes of Exercise per Session: 20 min  Stress: No Stress Concern Present   Feeling of Stress : Not at all  Social Connections: Socially Isolated   Frequency of Communication with Friends and Family: More than three times a week   Frequency of Social Gatherings with Friends and Family: More than three times a week   Attends Religious Services: Never   Marine scientist or Organizations: No   Attends Music therapist: Never   Marital Status: Divorced  Human resources officer Violence: Not At Risk   Fear of Current or Ex-Partner: No   Emotionally  Abused: No   Physically Abused: No   Sexually Abused: No    FAMILY HISTORY:  No family history on file.  CURRENT MEDICATIONS:  Current Outpatient Medications  Medication Sig Dispense Refill   acetaminophen (TYLENOL) 325 MG tablet Take 2 tablets (650 mg total) by mouth every 6 (six) hours as needed for mild pain (or Fever >/= 101). 30 tablet 30   acyclovir (ZOVIRAX) 400 MG tablet Take 1 tablet (400 mg total) by mouth 2 (two) times daily. 60 tablet 6   aspirin 81 MG chewable tablet Chew by mouth daily.     feeding supplement (ENSURE ENLIVE / ENSURE PLUS) LIQD Take 237 mLs by mouth 3 (three) times daily between meals. 237 mL 12   lenalidomide (REVLIMID) 15 MG capsule Take 1 capsule (15 mg total) by mouth daily. 14 days on, 7 days off 14 capsule 0   calcium-vitamin D (OSCAL WITH D) 500-200 MG-UNIT tablet Take 1 tablet by mouth 2 (two) times daily. 60 tablet 0   No current facility-administered medications for this visit.   Facility-Administered Medications Ordered in Other Visits  Medication Dose Route Frequency Provider Last Rate Last Admin   acetaminophen (TYLENOL) tablet 650 mg  650 mg Oral Once Derek Jack, MD       bortezomib SQ (VELCADE) chemo injection (2.5mg /mL concentration) 2.5 mg  1.3 mg/m2 (Treatment Plan Recorded) Subcutaneous Once Derek Jack, MD       daratumumab-hyaluronidase-[facility] FASPRO) 1800-30000 MG-UT/15ML chemo SQ injection 1,800 mg  1,800 mg Subcutaneous Once Derek Jack, MD       dexamethasone (DECADRON) tablet 40 mg  40 mg Oral Once Derek Jack, MD       diphenhydrAMINE (BENADRYL) capsule 50 mg  50 mg Oral Once Derek Jack, MD        ALLERGIES:  No Known Allergies  PHYSICAL EXAM:  Performance status (ECOG): 1 - Symptomatic but completely ambulatory  Vitals:   01/03/21 0916  BP: (!) 177/64  Pulse: 80  Resp: 18  Temp: (!) 97 F (36.1 C)  SpO2: 100%   Wt Readings from Last 3 Encounters:  01/03/21 171  lb 8 oz (77.8 kg)  12/27/20 166 lb 11.2 oz (75.6 kg)  12/20/20 163 lb 3.2 oz (74 kg)   Physical Exam Vitals reviewed.  Constitutional:      Appearance: Normal appearance.  Cardiovascular:     Rate and Rhythm: Normal rate and regular rhythm.     Pulses: Normal pulses.     Heart sounds: Normal heart sounds.  Pulmonary:     Effort: Pulmonary effort is normal.     Breath sounds: Normal breath sounds.  Abdominal:     Palpations: Abdomen is soft. There is no hepatomegaly, splenomegaly or mass.     Tenderness: There is no abdominal tenderness.  Neurological:     General:  No focal deficit present.     Mental Status: He is alert and oriented to person, place, and time.  Psychiatric:        Mood and Affect: Mood normal.        Behavior: Behavior normal.    LABORATORY DATA:  I have reviewed the labs as listed.  CBC Latest Ref Rng & Units 01/03/2021 12/27/2020 12/20/2020  WBC 4.0 - 10.5 K/uL 6.4 9.4 6.1  Hemoglobin 13.0 - 17.0 g/dL 10.4(L) 10.1(L) 9.5(L)  Hematocrit 39.0 - 52.0 % 32.9(L) 32.9(L) 30.7(L)  Platelets 150 - 400 K/uL 200 203 182   CMP Latest Ref Rng & Units 01/03/2021 12/27/2020 12/20/2020  Glucose 70 - 99 mg/dL 128(H) 105(H) 126(H)  BUN 8 - 23 mg/dL 24(H) 25(H) 23  Creatinine 0.61 - 1.24 mg/dL 0.93 0.96 0.98  Sodium 135 - 145 mmol/L 138 138 138  Potassium 3.5 - 5.1 mmol/L 4.1 4.2 4.4  Chloride 98 - 111 mmol/L 106 104 104  CO2 22 - 32 mmol/L $RemoveB'26 27 28  'PHxPzMua$ Calcium 8.9 - 10.3 mg/dL 8.3(L) 7.9(L) 7.9(L)  Total Protein 6.5 - 8.1 g/dL 6.1(L) 5.9(L) 6.3(L)  Total Bilirubin 0.3 - 1.2 mg/dL 0.7 0.9 0.7  Alkaline Phos 38 - 126 U/L 112 112 126  AST 15 - 41 U/L $Remo'20 21 24  'qPGWw$ ALT 0 - 44 U/L $Remo'14 18 20    'LLmfc$ DIAGNOSTIC IMAGING:  I have independently reviewed the scans and discussed with the patient. No results found.    ASSESSMENT:  1.  IgG kappa light chain multiple myeloma: - Admission with hypercalcemia and renal failure. - CT CAP showed extensive lytic foci throughout the axial and  appendicular skeleton of the chest, abdomen and pelvis.  Splenomegaly with an enlarged lobular spleen, nonspecific. - Bone marrow biopsy on 11/01/2020 shows 85% atypical plasma cells in the aspirate. - FISH panel positive for 1 p-,-13,14q-,16q-,17p-/-17 and 20q- - Cytogenetics-no metaphases available for analysis. - SPEP with 5.4 g of M spike.  Free light chain ratio 1030.  LDH normal.  Beta-2 microglobulin 18.7.  24-hour urine total protein 3.6 g. - Dara VRD started on 11/21/2020.  Revlimid 15 mg 2 weeks on 1 week off started on 11/22/2020.   2.  Social/family history: - He worked as a Administrator.  He lives by himself. - Mother with breast and ovarian cancer.  Father had metastatic kidney cancer.  His brother's daughter has CML.  PLAN:  1.  Stage III IgG kappa light chain multiple myeloma, high risk: - He has completed 2 cycles of Dara VRD. - He did not experience any major side effects.  His systolic blood pressure is 177 today.  He is not taking any blood pressure medication.  He reports systolic blood pressure at home is between 120 and 130.  No tingling or numbness in the extremities. - continue Revlimid 15 mg 2 weeks on 1 week off today. He has been getting these on time.  - If his renal function continues to be good, we will plan to increase Revlimid dose. - Reviewed his CBC which showed hemoglobin improved to 10.4.  LFTs are normal. - Proceed with cycle 3 of Dara VRD today.  RTC 3 weeks for follow-up. - Myeloma panel from last week shows excellent response to treatment - Also talked to him about bone marrow transplant given his high risk disease.  He would like to think about it and will let us know next week where to make the referral.   2.  Hypomagnesemia: - He had low magnesium in the past.   --Mag today 1.8   3.  Elevated carbon monoxide levels: - He had previously increased carboxyhemoglobin levels.  Oxygen saturation is 100% today on room air. - His breathing is better.  No  further testing needed.   4.  ID prophylaxis: - patient declines to take acyclovir twice daily.  Continue aspirin 81 mg daily.   5.  Myeloma bone disease: - Calcium today is 8.3. - We will plan to start him on denosumab soon.   Orders placed this encounter:  No orders of the defined types were placed in this encounter.   Paul Peoples, MD Department of Hematology/Oncology [facility] Phone: [phone] Pager: [phone] Email: [email or web address]

## 2021-01-03 NOTE — Patient Instructions (Signed)
[facility]  Discharge Instructions: Thank you for choosing [facility] to provide your oncology and hematology care.  If you have a lab appointment with the Cancer Center, please come in thru the Main Entrance and check in at the main information desk.  Wear comfortable clothing and clothing appropriate for easy access to any Portacath or PICC line.   We strive to give you quality time with your provider. You may need to reschedule your appointment if you arrive late (15 or more minutes).  Arriving late affects you and other patients whose appointments are after yours.  Also, if you miss three or more appointments without notifying the office, you may be dismissed from the clinic at the provider's discretion.      For prescription refill requests, have your pharmacy contact our office and allow 72 hours for refills to be completed.    Today you received the following chemotherapy and/or immunotherapy agents    To help prevent nausea and vomiting after your treatment, we encourage you to take your nausea medication as directed.  BELOW ARE SYMPTOMS THAT SHOULD BE REPORTED IMMEDIATELY: . *FEVER GREATER THAN 100.4 F (38 C) OR HIGHER . *CHILLS OR SWEATING . *NAUSEA AND VOMITING THAT IS NOT CONTROLLED WITH YOUR NAUSEA MEDICATION . *UNUSUAL SHORTNESS OF BREATH . *UNUSUAL BRUISING OR BLEEDING . *URINARY PROBLEMS (pain or burning when urinating, or frequent urination) . *BOWEL PROBLEMS (unusual diarrhea, constipation, pain near the anus) . TENDERNESS IN MOUTH AND THROAT WITH OR WITHOUT PRESENCE OF ULCERS (sore throat, sores in mouth, or a toothache) . UNUSUAL RASH, SWELLING OR PAIN  . UNUSUAL VAGINAL DISCHARGE OR ITCHING   Items with * indicate a potential emergency and should be followed up as soon as possible or go to the Emergency Department if any problems should occur.  Please show the CHEMOTHERAPY ALERT CARD or IMMUNOTHERAPY ALERT CARD at check-in to the  Emergency Department and triage nurse.  Should you have questions after your visit or need to cancel or reschedule your appointment, please contact [facility] [phone]  and follow the prompts.  Office hours are 8:00 a.m. to 4:30 p.m. Monday - Friday. Please note that voicemails left after 4:00 p.m. may not be returned until the following business day.  We are closed weekends and major holidays. You have access to a nurse at all times for urgent questions. Please call the main number to the clinic [phone] and follow the prompts.  For any non-urgent questions, you may also contact your provider using MyChart. We now offer e-Visits for anyone 18 and older to request care online for non-urgent symptoms. For details visit [email or web address].   Also download the MyChart app! Go to the app store, search "MyChart", open the app, select , and log in with your MyChart username and password.  Due to Covid, a mask is required upon entering the hospital/clinic. If you do not have a mask, one will be given to you upon arrival. For doctor visits, patients may have 1 support person aged 18 or older with them. For treatment visits, patients cannot have anyone with them due to current Covid guidelines and our immunocompromised population.  

## 2021-01-04 ENCOUNTER — Other Ambulatory Visit (HOSPITAL_COMMUNITY): Payer: Self-pay

## 2021-01-04 DIAGNOSIS — C9000 Multiple myeloma not having achieved remission: Secondary | ICD-10-CM

## 2021-01-05 ENCOUNTER — Encounter: Payer: Self-pay | Admitting: Hematology and Oncology

## 2021-01-06 ENCOUNTER — Other Ambulatory Visit (HOSPITAL_COMMUNITY): Payer: Self-pay

## 2021-01-06 MED ORDER — LENALIDOMIDE 15 MG PO CAPS: 15.0000 mg | ORAL_CAPSULE | Freq: Every day | ORAL | 0 refills | Status: DC

## 2021-01-06 NOTE — Telephone Encounter (Signed)
Chart reviewed. Revlimid refilled per last office visit with Dr. Lorenso Courier.

## 2021-01-10 ENCOUNTER — Inpatient Hospital Stay (HOSPITAL_COMMUNITY): Payer: Commercial Managed Care - PPO

## 2021-01-10 ENCOUNTER — Encounter (HOSPITAL_COMMUNITY): Payer: Self-pay

## 2021-01-10 ENCOUNTER — Other Ambulatory Visit: Payer: Self-pay

## 2021-01-10 VITALS — BP 163/62 | HR 68 | Temp 96.8°F | Resp 18 | Wt 174.0 lb

## 2021-01-10 DIAGNOSIS — Z5112 Encounter for antineoplastic immunotherapy: Secondary | ICD-10-CM | POA: Diagnosis not present

## 2021-01-10 DIAGNOSIS — C9000 Multiple myeloma not having achieved remission: Secondary | ICD-10-CM

## 2021-01-10 LAB — COMPREHENSIVE METABOLIC PANEL
ALT: 14 U/L (ref 0–44)
AST: 18 U/L (ref 15–41)
Albumin: 3.4 g/dL — ABNORMAL LOW (ref 3.5–5.0)
Alkaline Phosphatase: 112 U/L (ref 38–126)
Anion gap: 7 (ref 5–15)
BUN: 24 mg/dL — ABNORMAL HIGH (ref 8–23)
CO2: 27 mmol/L (ref 22–32)
Calcium: 8.2 mg/dL — ABNORMAL LOW (ref 8.9–10.3)
Chloride: 104 mmol/L (ref 98–111)
Creatinine, Ser: 0.92 mg/dL (ref 0.61–1.24)
GFR, Estimated: >60 mL/min (ref >60)
Glucose, Bld: 114 mg/dL — ABNORMAL HIGH (ref 70–99)
Potassium: 3.9 mmol/L (ref 3.5–5.1)
Sodium: 138 mmol/L (ref 135–145)
Total Bilirubin: 0.9 mg/dL (ref 0.3–1.2)
Total Protein: 5.7 g/dL — ABNORMAL LOW (ref 6.5–8.1)

## 2021-01-10 LAB — CBC WITH DIFFERENTIAL/PLATELET
Abs Immature Granulocytes: 0.04 10*3/uL (ref 0.00–0.07)
Basophils Absolute: 0 10*3/uL (ref 0.0–0.1)
Basophils Relative: 1 %
Eosinophils Absolute: 0.5 10*3/uL (ref 0.0–0.5)
Eosinophils Relative: 6 %
HCT: 33.2 % — ABNORMAL LOW (ref 39.0–52.0)
Hemoglobin: 10.4 g/dL — ABNORMAL LOW (ref 13.0–17.0)
Immature Granulocytes: 1 %
Lymphocytes Relative: 11 %
Lymphs Abs: 0.9 10*3/uL (ref 0.7–4.0)
MCH: 29.6 pg (ref 26.0–34.0)
MCHC: 31.3 g/dL (ref 30.0–36.0)
MCV: 94.6 fL (ref 80.0–100.0)
Monocytes Absolute: 0.8 10*3/uL (ref 0.1–1.0)
Monocytes Relative: 10 %
Neutro Abs: 5.8 10*3/uL (ref 1.7–7.7)
Neutrophils Relative %: 71 %
Platelets: 182 10*3/uL (ref 150–400)
RBC: 3.51 MIL/uL — ABNORMAL LOW (ref 4.22–5.81)
RDW: 15.9 % — ABNORMAL HIGH (ref 11.5–15.5)
WBC: 7.9 10*3/uL (ref 4.0–10.5)
nRBC: 0 % (ref 0.0–0.2)

## 2021-01-10 LAB — MAGNESIUM: Magnesium: 1.7 mg/dL (ref 1.7–2.4)

## 2021-01-10 MED ORDER — ACETAMINOPHEN 325 MG PO TABS
650.0000 mg | ORAL_TABLET | Freq: Once | ORAL | Status: AC
  Administered 2021-01-10: 650 mg via ORAL
  Filled 2021-01-10: qty 2

## 2021-01-10 MED ORDER — DARATUMUMAB-HYALURONIDASE-FIHJ 1800-30000 MG-UT/15ML ~~LOC~~ SOLN
1800.0000 mg | Freq: Once | SUBCUTANEOUS | Status: AC
  Administered 2021-01-10: 1800 mg via SUBCUTANEOUS
  Filled 2021-01-10: qty 15

## 2021-01-10 MED ORDER — BORTEZOMIB CHEMO SQ INJECTION 3.5 MG (2.5MG/ML)
1.3000 mg/m2 | Freq: Once | INTRAMUSCULAR | Status: AC
  Administered 2021-01-10: 2.5 mg via SUBCUTANEOUS
  Filled 2021-01-10: qty 1

## 2021-01-10 MED ORDER — DEXAMETHASONE 4 MG PO TABS
40.0000 mg | ORAL_TABLET | Freq: Once | ORAL | Status: AC
  Administered 2021-01-10: 40 mg via ORAL
  Filled 2021-01-10: qty 10

## 2021-01-10 MED ORDER — DIPHENHYDRAMINE HCL 25 MG PO CAPS
50.0000 mg | ORAL_CAPSULE | Freq: Once | ORAL | Status: AC
Start: 2021-01-10 — End: 2021-01-10
  Administered 2021-01-10: 50 mg via ORAL
  Filled 2021-01-10: qty 2

## 2021-01-10 NOTE — Progress Notes (Signed)
Labs reviewed and within treatment parameters. Patient tolerated Velcade injection with no complaints voiced. Lab work reviewed. See MAR for details. Injection site clean and dry with no bruising or swelling noted. Patient stable during and after injection. Band aid applied.   Patient tolerated Daratumumab injection with no complaints voiced. See MAR for details. Lab reviewed. Injection site clean and dry with no bruising or swelling noted at site. Band aid applied. Vss with discharge and left ambulatory  in satisfactory condition with nos/s of distress noted.

## 2021-01-10 NOTE — Patient Instructions (Signed)
[facility]  Discharge Instructions: Thank you for choosing [facility] to provide your oncology and hematology care.  If you have a lab appointment with the [facility], please come in thru the Main Entrance and check in at the main information desk.  Wear comfortable clothing and clothing appropriate for easy access to any Portacath or PICC line.   We strive to give you quality time with your provider. You may need to reschedule your appointment if you arrive late (15 or more minutes).  Arriving late affects you and other patients whose appointments are after yours.  Also, if you miss three or more appointments without notifying the office, you may be dismissed from the clinic at the provider's discretion.      For prescription refill requests, have your pharmacy contact our office and allow 72 hours for refills to be completed.    Today you received the following chemotherapy and/or immunotherapy agents Velcade & Daratumumab. Returned as scheduled.    To help prevent nausea and vomiting after your treatment, we encourage you to take your nausea medication as directed.  BELOW ARE SYMPTOMS THAT SHOULD BE REPORTED IMMEDIATELY: *FEVER GREATER THAN 100.4 F (38 C) OR HIGHER *CHILLS OR SWEATING *NAUSEA AND VOMITING THAT IS NOT CONTROLLED WITH YOUR NAUSEA MEDICATION *UNUSUAL SHORTNESS OF BREATH *UNUSUAL BRUISING OR BLEEDING *URINARY PROBLEMS (pain or burning when urinating, or frequent urination) *BOWEL PROBLEMS (unusual diarrhea, constipation, pain near the anus) TENDERNESS IN MOUTH AND THROAT WITH OR WITHOUT PRESENCE OF ULCERS (sore throat, sores in mouth, or a toothache) UNUSUAL RASH, SWELLING OR PAIN  UNUSUAL VAGINAL DISCHARGE OR ITCHING   Items with * indicate a potential emergency and should be followed up as soon as possible or go to the Emergency Department if any problems should occur.  Please show the CHEMOTHERAPY ALERT CARD or IMMUNOTHERAPY ALERT CARD  at check-in to the Emergency Department and triage nurse.  Should you have questions after your visit or need to cancel or reschedule your appointment, please contact [facility] [phone]  and follow the prompts.  Office hours are 8:00 a.m. to 4:30 p.m. Monday - Friday. Please note that voicemails left after 4:00 p.m. may not be returned until the following business day.  We are closed weekends and major holidays. You have access to a nurse at all times for urgent questions. Please call the main number to the clinic [phone] and follow the prompts.  For any non-urgent questions, you may also contact your provider using MyChart. We now offer e-Visits for anyone 45 and older to request care online for non-urgent symptoms. For details visit [email or web address].   Also download the MyChart app! Go to the app store, search "MyChart", open the app, select [facility], and log in with your MyChart username and password.  Due to Covid, a mask is required upon entering the hospital/clinic. If you do not have a mask, one will be given to you upon arrival. For doctor visits, patients may have 1 support person aged 94 or older with them. For treatment visits, patients cannot have anyone with them due to current Covid guidelines and our immunocompromised population.

## 2021-01-17 ENCOUNTER — Encounter (HOSPITAL_COMMUNITY): Payer: Self-pay

## 2021-01-17 ENCOUNTER — Inpatient Hospital Stay (HOSPITAL_COMMUNITY): Payer: Commercial Managed Care - PPO

## 2021-01-17 ENCOUNTER — Inpatient Hospital Stay (HOSPITAL_COMMUNITY): Payer: Commercial Managed Care - PPO | Attending: Hematology

## 2021-01-17 ENCOUNTER — Other Ambulatory Visit: Payer: Self-pay

## 2021-01-17 VITALS — BP 168/78 | HR 77 | Temp 97.2°F | Resp 20 | Wt 172.6 lb

## 2021-01-17 DIAGNOSIS — C9000 Multiple myeloma not having achieved remission: Secondary | ICD-10-CM | POA: Diagnosis present

## 2021-01-17 DIAGNOSIS — Z5112 Encounter for antineoplastic immunotherapy: Secondary | ICD-10-CM | POA: Insufficient documentation

## 2021-01-17 DIAGNOSIS — Z79899 Other long term (current) drug therapy: Secondary | ICD-10-CM | POA: Insufficient documentation

## 2021-01-17 LAB — CBC WITH DIFFERENTIAL/PLATELET
Abs Immature Granulocytes: 0.04 10*3/uL (ref 0.00–0.07)
Basophils Absolute: 0.1 10*3/uL (ref 0.0–0.1)
Basophils Relative: 1 %
Eosinophils Absolute: 0.2 10*3/uL (ref 0.0–0.5)
Eosinophils Relative: 2 %
HCT: 35.3 % — ABNORMAL LOW (ref 39.0–52.0)
Hemoglobin: 11.1 g/dL — ABNORMAL LOW (ref 13.0–17.0)
Immature Granulocytes: 1 %
Lymphocytes Relative: 12 %
Lymphs Abs: 1 10*3/uL (ref 0.7–4.0)
MCH: 29.2 pg (ref 26.0–34.0)
MCHC: 31.4 g/dL (ref 30.0–36.0)
MCV: 92.9 fL (ref 80.0–100.0)
Monocytes Absolute: 1.3 10*3/uL — ABNORMAL HIGH (ref 0.1–1.0)
Monocytes Relative: 16 %
Neutro Abs: 5.6 10*3/uL (ref 1.7–7.7)
Neutrophils Relative %: 68 %
Platelets: 214 10*3/uL (ref 150–400)
RBC: 3.8 MIL/uL — ABNORMAL LOW (ref 4.22–5.81)
RDW: 15.6 % — ABNORMAL HIGH (ref 11.5–15.5)
WBC: 8.2 10*3/uL (ref 4.0–10.5)
nRBC: 0 % (ref 0.0–0.2)

## 2021-01-17 LAB — COMPREHENSIVE METABOLIC PANEL
ALT: 17 U/L (ref 0–44)
AST: 20 U/L (ref 15–41)
Albumin: 3.6 g/dL (ref 3.5–5.0)
Alkaline Phosphatase: 110 U/L (ref 38–126)
Anion gap: 9 (ref 5–15)
BUN: 25 mg/dL — ABNORMAL HIGH (ref 8–23)
CO2: 26 mmol/L (ref 22–32)
Calcium: 8 mg/dL — ABNORMAL LOW (ref 8.9–10.3)
Chloride: 101 mmol/L (ref 98–111)
Creatinine, Ser: 1.05 mg/dL (ref 0.61–1.24)
GFR, Estimated: >60 mL/min (ref >60)
Glucose, Bld: 136 mg/dL — ABNORMAL HIGH (ref 70–99)
Potassium: 4.1 mmol/L (ref 3.5–5.1)
Sodium: 136 mmol/L (ref 135–145)
Total Bilirubin: 0.8 mg/dL (ref 0.3–1.2)
Total Protein: 6.1 g/dL — ABNORMAL LOW (ref 6.5–8.1)

## 2021-01-17 LAB — MAGNESIUM: Magnesium: 1.8 mg/dL (ref 1.7–2.4)

## 2021-01-17 LAB — LACTATE DEHYDROGENASE: LDH: 133 U/L (ref 98–192)

## 2021-01-17 MED ORDER — DIPHENHYDRAMINE HCL 25 MG PO CAPS
50.0000 mg | ORAL_CAPSULE | Freq: Once | ORAL | Status: AC
Start: 2021-01-17 — End: 2021-01-17
  Administered 2021-01-17: 50 mg via ORAL
  Filled 2021-01-17: qty 2

## 2021-01-17 MED ORDER — DARATUMUMAB-HYALURONIDASE-FIHJ 1800-30000 MG-UT/15ML ~~LOC~~ SOLN
1800.0000 mg | Freq: Once | SUBCUTANEOUS | Status: AC
  Administered 2021-01-17: 1800 mg via SUBCUTANEOUS
  Filled 2021-01-17: qty 15

## 2021-01-17 MED ORDER — ACETAMINOPHEN 325 MG PO TABS
650.0000 mg | ORAL_TABLET | Freq: Once | ORAL | Status: AC
Start: 2021-01-17 — End: 2021-01-17
  Administered 2021-01-17: 650 mg via ORAL
  Filled 2021-01-17: qty 2

## 2021-01-17 MED ORDER — DEXAMETHASONE 4 MG PO TABS
40.0000 mg | ORAL_TABLET | Freq: Once | ORAL | Status: AC
  Administered 2021-01-17: 40 mg via ORAL
  Filled 2021-01-17: qty 10

## 2021-01-17 MED ORDER — BORTEZOMIB CHEMO SQ INJECTION 3.5 MG (2.5MG/ML)
1.3000 mg/m2 | Freq: Once | INTRAMUSCULAR | Status: AC
Start: 2021-01-17 — End: 2021-01-17
  Administered 2021-01-17: 2.5 mg via SUBCUTANEOUS
  Filled 2021-01-17: qty 1

## 2021-01-17 NOTE — Progress Notes (Signed)
Patient tolerated Velcade injection with no complaints voiced. Lab work reviewed. See MAR for details. Injection site clean and dry with no bruising or swelling noted. Patient stable during and after injection. Band aid applied.   Patient tolerated Daratumumab injection with no complaints voiced. See MAR for details. Lab reviewed. Injection site clean and dry with no bruising or swelling noted at site. Band aid applied. Vss with discharge and left in satisfactory condition with nos/s of distress noted.

## 2021-01-17 NOTE — Patient Instructions (Signed)
[facility]  Discharge Instructions: Thank you for choosing [facility] to provide your oncology and hematology care.  If you have a lab appointment with the [facility], please come in thru the Main Entrance and check in at the main information desk.  Wear comfortable clothing and clothing appropriate for easy access to any Portacath or PICC line.   We strive to give you quality time with your provider. You may need to reschedule your appointment if you arrive late (15 or more minutes).  Arriving late affects you and other patients whose appointments are after yours.  Also, if you miss three or more appointments without notifying the office, you may be dismissed from the clinic at the provider's discretion.      For prescription refill requests, have your pharmacy contact our office and allow 72 hours for refills to be completed.    Today you received the following chemotherapy and/or immunotherapy agents: Velcade and Daratumumab, return as scheduled.   To help prevent nausea and vomiting after your treatment, we encourage you to take your nausea medication as directed.  BELOW ARE SYMPTOMS THAT SHOULD BE REPORTED IMMEDIATELY: *FEVER GREATER THAN 100.4 F (38 C) OR HIGHER *CHILLS OR SWEATING *NAUSEA AND VOMITING THAT IS NOT CONTROLLED WITH YOUR NAUSEA MEDICATION *UNUSUAL SHORTNESS OF BREATH *UNUSUAL BRUISING OR BLEEDING *URINARY PROBLEMS (pain or burning when urinating, or frequent urination) *BOWEL PROBLEMS (unusual diarrhea, constipation, pain near the anus) TENDERNESS IN MOUTH AND THROAT WITH OR WITHOUT PRESENCE OF ULCERS (sore throat, sores in mouth, or a toothache) UNUSUAL RASH, SWELLING OR PAIN  UNUSUAL VAGINAL DISCHARGE OR ITCHING   Items with * indicate a potential emergency and should be followed up as soon as possible or go to the Emergency Department if any problems should occur.  Please show the CHEMOTHERAPY ALERT CARD or IMMUNOTHERAPY ALERT CARD  at check-in to the Emergency Department and triage nurse.  Should you have questions after your visit or need to cancel or reschedule your appointment, please contact [facility] [phone]  and follow the prompts.  Office hours are 8:00 a.m. to 4:30 p.m. Monday - Friday. Please note that voicemails left after 4:00 p.m. may not be returned until the following business day.  We are closed weekends and major holidays. You have access to a nurse at all times for urgent questions. Please call the main number to the clinic [phone] and follow the prompts.  For any non-urgent questions, you may also contact your provider using MyChart. We now offer e-Visits for anyone 21 and older to request care online for non-urgent symptoms. For details visit [email or web address].   Also download the MyChart app! Go to the app store, search "MyChart", open the app, select , and log in with your MyChart username and password.  Due to Covid, a mask is required upon entering the hospital/clinic. If you do not have a mask, one will be given to you upon arrival. For doctor visits, patients may have 1 support person aged 4 or older with them. For treatment visits, patients cannot have anyone with them due to current Covid guidelines and our immunocompromised population.

## 2021-01-18 LAB — KAPPA/LAMBDA LIGHT CHAINS
Kappa free light chain: 22.7 mg/L — ABNORMAL HIGH (ref 3.3–19.4)
Kappa, lambda light chain ratio: 1.97 — ABNORMAL HIGH (ref 0.26–1.65)
Lambda free light chains: 11.5 mg/L (ref 5.7–26.3)

## 2021-01-18 LAB — PROTEIN ELECTROPHORESIS, SERUM
A/G Ratio: 1.6 (ref 0.7–1.7)
Albumin ELP: 3.4 g/dL (ref 2.9–4.4)
Alpha-1-Globulin: 0.2 g/dL (ref 0.0–0.4)
Alpha-2-Globulin: 0.7 g/dL (ref 0.4–1.0)
Beta Globulin: 0.8 g/dL (ref 0.7–1.3)
Gamma Globulin: 0.4 g/dL (ref 0.4–1.8)
Globulin, Total: 2.1 g/dL — ABNORMAL LOW (ref 2.2–3.9)
M-Spike, %: 0.3 g/dL — ABNORMAL HIGH
Total Protein ELP: 5.5 g/dL — ABNORMAL LOW (ref 6.0–8.5)

## 2021-01-23 NOTE — Progress Notes (Signed)
[place], [place] [address]   CLINIC:  Medical Oncology/Hematology  PCP:  Carlena Hurl, PA-C [address] / [place] [address] [phone]   REASON FOR VISIT:  Follow-up for multiple myeloma  PRIOR THERAPY: none  NGS Results: not done  CURRENT THERAPY: DaraBorD every 3 weeks; Revlimid 15 mg 2/3 weeks  BRIEF ONCOLOGIC HISTORY:  Oncology History  Multiple myeloma not having achieved remission ([facility])  11/01/2020 Initial Diagnosis   Multiple myeloma not having achieved remission ([facility])    11/02/2020 - 11/02/2020 Chemotherapy          11/21/2020 -  Chemotherapy    Patient is on Treatment Plan: MYELOMA NEWLY DIAGNOSED TRANSPLANT CANDIDATE DARAVRD (DARATUMUMAB SQ) Q21D X 6 CYCLES (INDUCTION/CONSOLIDATION)         CANCER STAGING: Cancer Staging No matching staging information was found for the patient.  Virtual Visit via Video Note  I connected with Oliver Pila on 01/24/21 at 10:30 AM EDT by a video enabled telemedicine application and verified that I am speaking with the correct person using two identifiers.  Location: Patient: In the clinic Provider: At home Person present: Margaretmary Lombard, RN   I discussed the limitations of evaluation and management by telemedicine and the availability of in person appointments. The patient expressed understanding and agreed to proceed.  INTERVAL HISTORY:  Mr. Hawke Villalpando, a 65 y.o. male, returns for routine follow-up and consideration for next cycle of chemotherapy. [place]arton was last seen on 12/13/20.  Due for cycle #4 of DaraVRd today.   Overall, he tells me he has been feeling pretty well. He reports that he is tolerating the treatment well. He denies tingling or numbness in hands or feet, dental issues, n/v/d, or new pains. He reports good appetite.   Overall, he feels ready for next cycle of chemo today.  REVIEW OF SYSTEMS:  Review of Systems   Constitutional:  Negative for appetite change.  Gastrointestinal:  Negative for diarrhea, nausea and vomiting.  Musculoskeletal:  Negative for arthralgias and myalgias.  Neurological:  Negative for numbness.   PAST MEDICAL/SURGICAL HISTORY:  Past Medical History:  Diagnosis Date   Broken leg    Colonoscopy refused 09/2017   Hyperlipidemia    Hypertension    Multiple myeloma ([place])    Pneumonia    Refuses treatment 09/2017   refuses screenings such as cancer screening, refuses vaccines, refuses normal preventative care   Vaccine refused by patient    all vaccines as of 09/2017   Past Surgical History:  Procedure Laterality Date   COLONOSCOPY     never, declines as of 09/2017    SOCIAL HISTORY:  Social History   Socioeconomic History   Marital status: Divorced    Spouse name: Not on file   Number of children: 1   Years of education: Not on file   Highest education level: Not on file  Occupational History   Occupation: Disability  Tobacco Use   Smoking status: Never   Smokeless tobacco: Never  Substance and Sexual Activity   Alcohol use: No   Drug use: Never   Sexual activity: Not Currently  Other Topics Concern   Not on file  Social History Narrative   Not on file   Social Determinants of Health   Financial Resource Strain: Medium Risk   Difficulty of Paying Living Expenses: Somewhat hard  Food Insecurity: No Food Insecurity   Worried About [place] in the Last  Year: Never true   [place] in the Last Year: Never true  Transportation Needs: No Transportation Needs   Lack of Transportation (Medical): No   Lack of Transportation (Non-Medical): No  Physical Activity: Insufficiently Active   Days of Exercise per Week: 5 days   Minutes of Exercise per Session: 20 min  Stress: No Stress Concern Present   Feeling of Stress : Not at all  Social Connections: Socially Isolated   Frequency of Communication with Friends and Family: More than three  times a week   Frequency of Social Gatherings with Friends and Family: More than three times a week   Attends Religious Services: Never   Marine scientist or Organizations: No   Attends Music therapist: Never   Marital Status: Divorced  Human resources officer Violence: Not At Risk   Fear of Current or Ex-Partner: No   Emotionally Abused: No   Physically Abused: No   Sexually Abused: No    FAMILY HISTORY:  No family history on file.  CURRENT MEDICATIONS:  Current Outpatient Medications  Medication Sig Dispense Refill   acetaminophen (TYLENOL) 325 MG tablet Take 2 tablets (650 mg total) by mouth every 6 (six) hours as needed for mild pain (or Fever >/= 101). 30 tablet 30   acyclovir (ZOVIRAX) 400 MG tablet Take 1 tablet (400 mg total) by mouth 2 (two) times daily. 60 tablet 6   aspirin 81 MG chewable tablet Chew by mouth daily.     calcium-vitamin D (OSCAL WITH D) 500-200 MG-UNIT tablet Take 1 tablet by mouth 2 (two) times daily. 60 tablet 0   feeding supplement (ENSURE ENLIVE / ENSURE PLUS) LIQD Take 237 mLs by mouth 3 (three) times daily between meals. 237 mL 12   lenalidomide (REVLIMID) 15 MG capsule Take 1 capsule (15 mg total) by mouth daily. 14 days on, 7 days off 14 capsule 0   No current facility-administered medications for this visit.    ALLERGIES:  No Known Allergies  Performance status (ECOG): 1 - Symptomatic but completely ambulatory  There were no vitals filed for this visit. Wt Readings from Last 3 Encounters:  01/17/21 172 lb 9.6 oz (78.3 kg)  01/10/21 174 lb (78.9 kg)  01/03/21 171 lb 8 oz (77.8 kg)    LABORATORY DATA:  I have reviewed the labs as listed.  CBC Latest Ref Rng & Units 01/17/2021 01/10/2021 01/03/2021  WBC 4.0 - 10.5 K/uL 8.2 7.9 6.4  Hemoglobin 13.0 - 17.0 g/dL 11.1(L) 10.4(L) 10.4(L)  Hematocrit 39.0 - 52.0 % 35.3(L) 33.2(L) 32.9(L)  Platelets 150 - 400 K/uL 214 182 200   CMP Latest Ref Rng & Units 01/17/2021 01/10/2021 01/03/2021   Glucose 70 - 99 mg/dL 136(H) 114(H) 128(H)  BUN 8 - 23 mg/dL 25(H) 24(H) 24(H)  Creatinine 0.61 - 1.24 mg/dL 1.05 0.92 0.93  Sodium 135 - 145 mmol/L 136 138 138  Potassium 3.5 - 5.1 mmol/L 4.1 3.9 4.1  Chloride 98 - 111 mmol/L 101 104 106  CO2 22 - 32 mmol/L _0 Calcium 8.9 - 10.3 mg/dL 8.0(L) 8.2(L) 8.3(L)  Total Protein 6.5 - 8.1 g/dL 6.1(L) 5.7(L) 6.1(L)  Total Bilirubin 0.3 - 1.2 mg/dL 0.8 0.9 0.7  Alkaline Phos 38 - 126 U/L 110 112 112  AST 15 - 41 U/L _1 ALT 0 - 44 U/L _2 DIAGNOSTIC IMAGING:  I have independently reviewed the scans and discussed with the patient.  No results found.   ASSESSMENT:  1.  IgG kappa light chain multiple myeloma: - Admission with hypercalcemia and renal failure. - CT CAP showed extensive lytic foci throughout the axial and appendicular skeleton of the chest, abdomen and pelvis.  Splenomegaly with an enlarged lobular spleen, nonspecific. - Bone marrow biopsy on 11/01/2020 shows 85% atypical plasma cells in the aspirate. - FISH panel positive for 1 p-,-13,14q-,16q-,17p-/-17 and 20q- - Cytogenetics-no metaphases available for analysis. - SPEP with 5.4 g of M spike.  Free light chain ratio 1030.  LDH normal.  Beta-2 microglobulin 18.7.  24-hour urine total protein 3.6 g. - Dara VRD started on 11/21/2020.  Revlimid 15 mg 2 weeks on 1 week off started on 11/22/2020.   2.  Social/family history: - He worked as a Administrator.  He lives by himself. - Mother with breast and ovarian cancer.  Father had metastatic kidney cancer.  His brother's daughter has CML.   PLAN:  1.  Stage III IgG kappa light chain multiple myeloma, high risk: - He has completed 3 cycles of Dara VRD. - He is taking Revlimid 15 mg 2 weeks on 1 week off. - Denies any tingling or numbness in the extremities.  Denies any GI side effects. - Reviewed myeloma labs from 01/17/2021.  M spike improved to 0.3 g.  Free light chain ratio has improved to 1.97. - I have  previously talked to him about bone marrow transplant given his high risk disease.  He has not made a decision yet. - I will go ahead and make a referral to bone marrow transplant team at [name].  He is willing to at least talk to them and get their opinion.  I have also called the patient's brother [name] and left a message on his phone. - I have reviewed his labs today which shows normal CBC.  Hemoglobin improved 11.6.  Creatinine is 1.05 calcium was 8.4.  Other electrolytes were normal.  He will start day 1 cycle 4 treatment today. - RTC 3 weeks for follow-up.   2.  Hypomagnesemia: - Magnesium was 1.8 on 01/17/2021.   3.  Hypertension: - Blood pressure today is elevated at 200/80.  He reports blood pressure is around 732 systolic at home. - He will receive clonidine 0.2 mg and Norvasc 10 mg daily. - We will start him on Norvasc 10 mg daily at home.   4.  ID prophylaxis: - It is not clear whether he was taking acyclovir.  I have asked him to take acyclovir twice daily for shingles prophylaxis.  Also continue aspirin 81 mg daily for thromboprophylaxis.   5.  Myeloma bone disease: - We talked about initiating him on denosumab.  We discussed side effects including hypocalcemia, flulike symptoms and rare chance of ONJ.  He reportedly goes to the dentist as needed and does not report any dental issues. - We will proceed with denosumab injection today.   Orders placed this encounter:  No orders of the defined types were placed in this encounter.  I provided 30 minutes of non-face-to-face time during this encounter.  Derek Jack, MD [place] [phone]   I, Thana Ates, am acting as a scribe for Dr. Derek Jack.  I, Derek Jack MD, have reviewed the above documentation for accuracy and completeness, and I agree with the above.

## 2021-01-24 ENCOUNTER — Other Ambulatory Visit: Payer: Self-pay

## 2021-01-24 ENCOUNTER — Inpatient Hospital Stay (HOSPITAL_BASED_OUTPATIENT_CLINIC_OR_DEPARTMENT_OTHER): Payer: Commercial Managed Care - PPO | Admitting: Hematology

## 2021-01-24 ENCOUNTER — Inpatient Hospital Stay (HOSPITAL_COMMUNITY): Payer: Commercial Managed Care - PPO

## 2021-01-24 DIAGNOSIS — C9000 Multiple myeloma not having achieved remission: Secondary | ICD-10-CM

## 2021-01-24 DIAGNOSIS — Z5112 Encounter for antineoplastic immunotherapy: Secondary | ICD-10-CM | POA: Diagnosis not present

## 2021-01-24 LAB — CBC WITH DIFFERENTIAL/PLATELET
Abs Immature Granulocytes: 0.03 10*3/uL (ref 0.00–0.07)
Basophils Absolute: 0.1 10*3/uL (ref 0.0–0.1)
Basophils Relative: 1 %
Eosinophils Absolute: 0.1 10*3/uL (ref 0.0–0.5)
Eosinophils Relative: 1 %
HCT: 36.6 % — ABNORMAL LOW (ref 39.0–52.0)
Hemoglobin: 11.6 g/dL — ABNORMAL LOW (ref 13.0–17.0)
Immature Granulocytes: 0 %
Lymphocytes Relative: 19 %
Lymphs Abs: 1.4 10*3/uL (ref 0.7–4.0)
MCH: 29 pg (ref 26.0–34.0)
MCHC: 31.7 g/dL (ref 30.0–36.0)
MCV: 91.5 fL (ref 80.0–100.0)
Monocytes Absolute: 1 10*3/uL (ref 0.1–1.0)
Monocytes Relative: 14 %
Neutro Abs: 4.8 10*3/uL (ref 1.7–7.7)
Neutrophils Relative %: 65 %
Platelets: 215 10*3/uL (ref 150–400)
RBC: 4 MIL/uL — ABNORMAL LOW (ref 4.22–5.81)
RDW: 15.3 % (ref 11.5–15.5)
WBC: 7.4 10*3/uL (ref 4.0–10.5)
nRBC: 0 % (ref 0.0–0.2)

## 2021-01-24 LAB — COMPREHENSIVE METABOLIC PANEL
ALT: 16 U/L (ref 0–44)
AST: 18 U/L (ref 15–41)
Albumin: 3.7 g/dL (ref 3.5–5.0)
Alkaline Phosphatase: 114 U/L (ref 38–126)
Anion gap: 6 (ref 5–15)
BUN: 27 mg/dL — ABNORMAL HIGH (ref 8–23)
CO2: 27 mmol/L (ref 22–32)
Calcium: 8.4 mg/dL — ABNORMAL LOW (ref 8.9–10.3)
Chloride: 104 mmol/L (ref 98–111)
Creatinine, Ser: 1.05 mg/dL (ref 0.61–1.24)
GFR, Estimated: >60 mL/min (ref >60)
Glucose, Bld: 118 mg/dL — ABNORMAL HIGH (ref 70–99)
Potassium: 3.8 mmol/L (ref 3.5–5.1)
Sodium: 137 mmol/L (ref 135–145)
Total Bilirubin: 0.9 mg/dL (ref 0.3–1.2)
Total Protein: 6.1 g/dL — ABNORMAL LOW (ref 6.5–8.1)

## 2021-01-24 LAB — MAGNESIUM: Magnesium: 1.8 mg/dL (ref 1.7–2.4)

## 2021-01-24 LAB — LACTATE DEHYDROGENASE: LDH: 157 U/L (ref 98–192)

## 2021-01-24 MED ORDER — DARATUMUMAB-HYALURONIDASE-FIHJ 1800-30000 MG-UT/15ML ~~LOC~~ SOLN
1800.0000 mg | Freq: Once | SUBCUTANEOUS | Status: AC
  Administered 2021-01-24: 1800 mg via SUBCUTANEOUS
  Filled 2021-01-24: qty 15

## 2021-01-24 MED ORDER — AMLODIPINE BESYLATE 10 MG PO TABS
10.0000 mg | ORAL_TABLET | Freq: Once | ORAL | Status: DC
  Filled 2021-01-24: qty 1

## 2021-01-24 MED ORDER — DENOSUMAB 120 MG/1.7ML ~~LOC~~ SOLN
120.0000 mg | Freq: Once | SUBCUTANEOUS | Status: AC
Start: 2021-01-24 — End: 2021-01-24
  Administered 2021-01-24: 120 mg via SUBCUTANEOUS
  Filled 2021-01-24: qty 1.7

## 2021-01-24 MED ORDER — CLONIDINE HCL 0.1 MG PO TABS: 0.2000 mg | ORAL_TABLET | Freq: Once | ORAL | Status: DC

## 2021-01-24 MED ORDER — CLONIDINE HCL 0.1 MG PO TABS
0.2000 mg | ORAL_TABLET | Freq: Once | ORAL | Status: AC
Start: 2021-01-24 — End: 2021-01-24
  Administered 2021-01-24: 0.2 mg via ORAL
  Filled 2021-01-24: qty 2

## 2021-01-24 MED ORDER — ACETAMINOPHEN 325 MG PO TABS
650.0000 mg | ORAL_TABLET | Freq: Once | ORAL | Status: AC
Start: 2021-01-24 — End: 2021-01-24
  Administered 2021-01-24: 650 mg via ORAL
  Filled 2021-01-24: qty 2

## 2021-01-24 MED ORDER — AMLODIPINE BESYLATE 10 MG PO TABS: 10.0000 mg | ORAL_TABLET | Freq: Every day | ORAL | 3 refills | Status: DC

## 2021-01-24 MED ORDER — BORTEZOMIB CHEMO SQ INJECTION 3.5 MG (2.5MG/ML)
1.3000 mg/m2 | Freq: Once | INTRAMUSCULAR | Status: AC
  Administered 2021-01-24: 2.5 mg via SUBCUTANEOUS
  Filled 2021-01-24: qty 1

## 2021-01-24 MED ORDER — DEXAMETHASONE 4 MG PO TABS
40.0000 mg | ORAL_TABLET | Freq: Once | ORAL | Status: AC
  Administered 2021-01-24: 40 mg via ORAL
  Filled 2021-01-24: qty 10

## 2021-01-24 MED ORDER — DIPHENHYDRAMINE HCL 25 MG PO CAPS
50.0000 mg | ORAL_CAPSULE | Freq: Once | ORAL | Status: AC
Start: 2021-01-24 — End: 2021-01-24
  Administered 2021-01-24: 50 mg via ORAL
  Filled 2021-01-24: qty 2

## 2021-01-24 MED ORDER — AMLODIPINE BESYLATE 5 MG PO TABS
10.0000 mg | ORAL_TABLET | Freq: Once | ORAL | Status: AC
  Administered 2021-01-24: 10 mg via ORAL
  Filled 2021-01-24: qty 2

## 2021-01-24 NOTE — Progress Notes (Signed)
Labs reviewed with MD today. Will proceed as planned.   Treatment given per orders. Patient tolerated it well without problems. Vitals stable and discharged home from clinic ambulatory. Follow up as scheduled.

## 2021-01-24 NOTE — Progress Notes (Signed)
Patient has been assessed, vital signs and labs have been reviewed by Dr. Katragadda. ANC, Creatinine, LFTs, and Platelets are within treatment parameters per Dr. Katragadda. The patient is good to proceed with treatment at this time. Primary RN and pharmacy aware.  

## 2021-01-24 NOTE — Patient Instructions (Signed)
[facility]  Discharge Instructions: Thank you for choosing [facility] to provide your oncology and hematology care.  If you have a lab appointment with the Cancer Center, please come in thru the Main Entrance and check in at the main information desk.  Wear comfortable clothing and clothing appropriate for easy access to any Portacath or PICC line.   We strive to give you quality time with your provider. You may need to reschedule your appointment if you arrive late (15 or more minutes).  Arriving late affects you and other patients whose appointments are after yours.  Also, if you miss three or more appointments without notifying the office, you may be dismissed from the clinic at the provider's discretion.      For prescription refill requests, have your pharmacy contact our office and allow 72 hours for refills to be completed.        To help prevent nausea and vomiting after your treatment, we encourage you to take your nausea medication as directed.  BELOW ARE SYMPTOMS THAT SHOULD BE REPORTED IMMEDIATELY: *FEVER GREATER THAN 100.4 F (38 C) OR HIGHER *CHILLS OR SWEATING *NAUSEA AND VOMITING THAT IS NOT CONTROLLED WITH YOUR NAUSEA MEDICATION *UNUSUAL SHORTNESS OF BREATH *UNUSUAL BRUISING OR BLEEDING *URINARY PROBLEMS (pain or burning when urinating, or frequent urination) *BOWEL PROBLEMS (unusual diarrhea, constipation, pain near the anus) TENDERNESS IN MOUTH AND THROAT WITH OR WITHOUT PRESENCE OF ULCERS (sore throat, sores in mouth, or a toothache) UNUSUAL RASH, SWELLING OR PAIN  UNUSUAL VAGINAL DISCHARGE OR ITCHING   Items with * indicate a potential emergency and should be followed up as soon as possible or go to the Emergency Department if any problems should occur.  Please show the CHEMOTHERAPY ALERT CARD or IMMUNOTHERAPY ALERT CARD at check-in to the Emergency Department and triage nurse.  Should you have questions after your visit or need to cancel  or reschedule your appointment, please contact [facility] [phone]  and follow the prompts.  Office hours are 8:00 a.m. to 4:30 p.m. Monday - Friday. Please note that voicemails left after 4:00 p.m. may not be returned until the following business day.  We are closed weekends and major holidays. You have access to a nurse at all times for urgent questions. Please call the main number to the clinic [phone] and follow the prompts.  For any non-urgent questions, you may also contact your provider using MyChart. We now offer e-Visits for anyone 18 and older to request care online for non-urgent symptoms. For details visit [email or web address].   Also download the MyChart app! Go to the app store, search "MyChart", open the app, select [facility], and log in with your MyChart username and password.  Due to Covid, a mask is required upon entering the hospital/clinic. If you do not have a mask, one will be given to you upon arrival. For doctor visits, patients may have 1 support person aged 18 or older with them. For treatment visits, patients cannot have anyone with them due to current Covid guidelines and our immunocompromised population.  

## 2021-01-24 NOTE — Patient Instructions (Addendum)
[facility] at [facility] Discharge Instructions  You were seen today by Dr. Delton Coombes. He went over your recent results. You will be referred to [facility] for a bone marrow transplant consultation. Dr. Delton Coombes will see you back in 1 week for labs and follow up.   Thank you for choosing [place] [facility] at [facility] to provide your oncology and hematology care.  To afford each patient quality time with our provider, please arrive at least 15 minutes before your scheduled appointment time.   If you have a lab appointment with the [facility] please come in thru the Main Entrance and check in at the main information desk  You need to re-schedule your appointment should you arrive 10 or more minutes late.  We strive to give you quality time with our providers, and arriving late affects you and other patients whose appointments are after yours.  Also, if you no show three or more times for appointments you may be dismissed from the clinic at the providers discretion.     Again, thank you for choosing [facility].  Our hope is that these requests will decrease the amount of time that you wait before being seen by our physicians.       _____________________________________________________________  Should you have questions after your visit to [facility], please contact our office at [phone] between the hours of 8:00 a.m. and 4:30 p.m.  Voicemails left after 4:00 p.m. will not be returned until the following business day.  For prescription refill requests, have your pharmacy contact our office and allow 72 hours.    Cancer Center Support Programs:   > Cancer Support Group  2nd Tuesday of the month 1pm-2pm, Journey Room

## 2021-01-25 LAB — KAPPA/LAMBDA LIGHT CHAINS
Kappa free light chain: 15.2 mg/L (ref 3.3–19.4)
Kappa, lambda light chain ratio: 2.05 — ABNORMAL HIGH (ref 0.26–1.65)
Lambda free light chains: 7.4 mg/L (ref 5.7–26.3)

## 2021-01-26 ENCOUNTER — Other Ambulatory Visit (HOSPITAL_COMMUNITY): Payer: Self-pay

## 2021-01-26 MED ORDER — LENALIDOMIDE 15 MG PO CAPS: 15.0000 mg | ORAL_CAPSULE | Freq: Every day | ORAL | 0 refills | Status: DC

## 2021-01-26 NOTE — Telephone Encounter (Signed)
Chart reviewed. Revlimid refilled per Dr. Delton Coombes

## 2021-01-31 ENCOUNTER — Inpatient Hospital Stay (HOSPITAL_COMMUNITY): Payer: Commercial Managed Care - PPO

## 2021-01-31 ENCOUNTER — Other Ambulatory Visit: Payer: Self-pay

## 2021-01-31 VITALS — BP 170/76 | HR 93 | Temp 97.1°F | Resp 18 | Wt 176.4 lb

## 2021-01-31 DIAGNOSIS — C9000 Multiple myeloma not having achieved remission: Secondary | ICD-10-CM

## 2021-01-31 DIAGNOSIS — Z5112 Encounter for antineoplastic immunotherapy: Secondary | ICD-10-CM | POA: Diagnosis not present

## 2021-01-31 LAB — CBC WITH DIFFERENTIAL/PLATELET
Abs Immature Granulocytes: 0.05 10*3/uL (ref 0.00–0.07)
Basophils Absolute: 0 10*3/uL (ref 0.0–0.1)
Basophils Relative: 0 %
Eosinophils Absolute: 0.3 10*3/uL (ref 0.0–0.5)
Eosinophils Relative: 3 %
HCT: 37.2 % — ABNORMAL LOW (ref 39.0–52.0)
Hemoglobin: 11.9 g/dL — ABNORMAL LOW (ref 13.0–17.0)
Immature Granulocytes: 1 %
Lymphocytes Relative: 14 %
Lymphs Abs: 1.2 10*3/uL (ref 0.7–4.0)
MCH: 29.3 pg (ref 26.0–34.0)
MCHC: 32 g/dL (ref 30.0–36.0)
MCV: 91.6 fL (ref 80.0–100.0)
Monocytes Absolute: 0.9 10*3/uL (ref 0.1–1.0)
Monocytes Relative: 11 %
Neutro Abs: 5.8 10*3/uL (ref 1.7–7.7)
Neutrophils Relative %: 71 %
Platelets: 162 10*3/uL (ref 150–400)
RBC: 4.06 MIL/uL — ABNORMAL LOW (ref 4.22–5.81)
RDW: 15.5 % (ref 11.5–15.5)
WBC: 8.2 10*3/uL (ref 4.0–10.5)
nRBC: 0 % (ref 0.0–0.2)

## 2021-01-31 LAB — COMPREHENSIVE METABOLIC PANEL
ALT: 15 U/L (ref 0–44)
AST: 16 U/L (ref 15–41)
Albumin: 3.8 g/dL (ref 3.5–5.0)
Alkaline Phosphatase: 113 U/L (ref 38–126)
Anion gap: 8 (ref 5–15)
BUN: 25 mg/dL — ABNORMAL HIGH (ref 8–23)
CO2: 29 mmol/L (ref 22–32)
Calcium: 8.3 mg/dL — ABNORMAL LOW (ref 8.9–10.3)
Chloride: 100 mmol/L (ref 98–111)
Creatinine, Ser: 1.05 mg/dL (ref 0.61–1.24)
GFR, Estimated: >60 mL/min (ref >60)
Glucose, Bld: 132 mg/dL — ABNORMAL HIGH (ref 70–99)
Potassium: 4.2 mmol/L (ref 3.5–5.1)
Sodium: 137 mmol/L (ref 135–145)
Total Bilirubin: 0.9 mg/dL (ref 0.3–1.2)
Total Protein: 6.1 g/dL — ABNORMAL LOW (ref 6.5–8.1)

## 2021-01-31 MED ORDER — ACETAMINOPHEN 325 MG PO TABS
650.0000 mg | ORAL_TABLET | Freq: Once | ORAL | Status: AC
  Administered 2021-01-31: 650 mg via ORAL

## 2021-01-31 MED ORDER — DEXAMETHASONE 4 MG PO TABS
ORAL_TABLET | ORAL | Status: AC
  Filled 2021-01-31: qty 10

## 2021-01-31 MED ORDER — DIPHENHYDRAMINE HCL 25 MG PO CAPS
50.0000 mg | ORAL_CAPSULE | Freq: Once | ORAL | Status: AC
  Administered 2021-01-31: 50 mg via ORAL

## 2021-01-31 MED ORDER — BORTEZOMIB CHEMO SQ INJECTION 3.5 MG (2.5MG/ML)
1.3000 mg/m2 | Freq: Once | INTRAMUSCULAR | Status: AC
  Administered 2021-01-31: 2.5 mg via SUBCUTANEOUS
  Filled 2021-01-31: qty 1

## 2021-01-31 MED ORDER — ACETAMINOPHEN 325 MG PO TABS
ORAL_TABLET | ORAL | Status: AC
  Filled 2021-01-31: qty 2

## 2021-01-31 MED ORDER — DEXAMETHASONE 4 MG PO TABS
40.0000 mg | ORAL_TABLET | Freq: Once | ORAL | Status: AC
  Administered 2021-01-31: 40 mg via ORAL

## 2021-01-31 MED ORDER — DARATUMUMAB-HYALURONIDASE-FIHJ 1800-30000 MG-UT/15ML ~~LOC~~ SOLN
1800.0000 mg | Freq: Once | SUBCUTANEOUS | Status: AC
  Administered 2021-01-31: 1800 mg via SUBCUTANEOUS
  Filled 2021-01-31: qty 15

## 2021-01-31 MED ORDER — DIPHENHYDRAMINE HCL 25 MG PO CAPS
ORAL_CAPSULE | ORAL | Status: AC
  Filled 2021-01-31: qty 2

## 2021-01-31 NOTE — Progress Notes (Signed)
[place] presents today for Velcade and Daratumumab injections per providers order.  Vital signs and labs within parameters for treatment.  Patient has no new complaints since last visit.  Velcade and Daratumumab administration without incident; injection site WNL; see MAR for injection details.  Patient tolerated procedure well and without incident.  No questions or complaints noted at this time. Discharge from clinic ambulatory in stable condition.  Alert and oriented X 3.  Follow up with [facility] as scheduled.

## 2021-01-31 NOTE — Patient Instructions (Signed)
[facility]  Discharge Instructions: Thank you for choosing [facility] to provide your oncology and hematology care.  If you have a lab appointment with the [facility], please come in thru the Main Entrance and check in at the main information desk.  Wear comfortable clothing and clothing appropriate for easy access to any Portacath or PICC line.   We strive to give you quality time with your provider. You may need to reschedule your appointment if you arrive late (15 or more minutes).  Arriving late affects you and other patients whose appointments are after yours.  Also, if you miss three or more appointments without notifying the office, you may be dismissed from the clinic at the provider's discretion.      For prescription refill requests, have your pharmacy contact our office and allow 72 hours for refills to be completed.    Today you received the following chemotherapy and/or immunotherapy agents Velcade/Daratumumab      To help prevent nausea and vomiting after your treatment, we encourage you to take your nausea medication as directed.  BELOW ARE SYMPTOMS THAT SHOULD BE REPORTED IMMEDIATELY: *FEVER GREATER THAN 100.4 F (38 C) OR HIGHER *CHILLS OR SWEATING *NAUSEA AND VOMITING THAT IS NOT CONTROLLED WITH YOUR NAUSEA MEDICATION *UNUSUAL SHORTNESS OF BREATH *UNUSUAL BRUISING OR BLEEDING *URINARY PROBLEMS (pain or burning when urinating, or frequent urination) *BOWEL PROBLEMS (unusual diarrhea, constipation, pain near the anus) TENDERNESS IN MOUTH AND THROAT WITH OR WITHOUT PRESENCE OF ULCERS (sore throat, sores in mouth, or a toothache) UNUSUAL RASH, SWELLING OR PAIN  UNUSUAL VAGINAL DISCHARGE OR ITCHING   Items with * indicate a potential emergency and should be followed up as soon as possible or go to the Emergency Department if any problems should occur.  Please show the CHEMOTHERAPY ALERT CARD or IMMUNOTHERAPY ALERT CARD at check-in to the  Emergency Department and triage nurse.  Should you have questions after your visit or need to cancel or reschedule your appointment, please contact [facility] [phone]  and follow the prompts.  Office hours are 8:00 a.m. to 4:30 p.m. Monday - Friday. Please note that voicemails left after 4:00 p.m. may not be returned until the following business day.  We are closed weekends and major holidays. You have access to a nurse at all times for urgent questions. Please call the main number to the clinic [phone] and follow the prompts.  For any non-urgent questions, you may also contact your provider using MyChart. We now offer e-Visits for anyone 28 and older to request care online for non-urgent symptoms. For details visit [email or web address].   Also download the MyChart app! Go to the app store, search "MyChart", open the app, select [facility], and log in with your MyChart username and password.  Due to Covid, a mask is required upon entering the hospital/clinic. If you do not have a mask, one will be given to you upon arrival. For doctor visits, patients may have 1 support person aged 73 or older with them. For treatment visits, patients cannot have anyone with them due to current Covid guidelines and our immunocompromised population.

## 2021-02-02 ENCOUNTER — Encounter (HOSPITAL_COMMUNITY): Payer: Self-pay

## 2021-02-07 ENCOUNTER — Inpatient Hospital Stay (HOSPITAL_COMMUNITY): Payer: Commercial Managed Care - PPO

## 2021-02-07 ENCOUNTER — Other Ambulatory Visit: Payer: Self-pay

## 2021-02-07 VITALS — BP 171/57 | HR 72 | Temp 97.0°F | Resp 18 | Wt 174.6 lb

## 2021-02-07 DIAGNOSIS — Z5112 Encounter for antineoplastic immunotherapy: Secondary | ICD-10-CM | POA: Diagnosis not present

## 2021-02-07 DIAGNOSIS — C9000 Multiple myeloma not having achieved remission: Secondary | ICD-10-CM

## 2021-02-07 LAB — CBC WITH DIFFERENTIAL/PLATELET
Abs Immature Granulocytes: 0.05 10*3/uL (ref 0.00–0.07)
Basophils Absolute: 0 10*3/uL (ref 0.0–0.1)
Basophils Relative: 0 %
Eosinophils Absolute: 0.2 10*3/uL (ref 0.0–0.5)
Eosinophils Relative: 2 %
HCT: 39 % (ref 39.0–52.0)
Hemoglobin: 12.3 g/dL — ABNORMAL LOW (ref 13.0–17.0)
Immature Granulocytes: 1 %
Lymphocytes Relative: 11 %
Lymphs Abs: 1.1 10*3/uL (ref 0.7–4.0)
MCH: 28.3 pg (ref 26.0–34.0)
MCHC: 31.5 g/dL (ref 30.0–36.0)
MCV: 89.9 fL (ref 80.0–100.0)
Monocytes Absolute: 1.5 10*3/uL — ABNORMAL HIGH (ref 0.1–1.0)
Monocytes Relative: 15 %
Neutro Abs: 7.4 10*3/uL (ref 1.7–7.7)
Neutrophils Relative %: 71 %
Platelets: 194 10*3/uL (ref 150–400)
RBC: 4.34 MIL/uL (ref 4.22–5.81)
RDW: 15.1 % (ref 11.5–15.5)
WBC: 10.3 10*3/uL (ref 4.0–10.5)
nRBC: 0 % (ref 0.0–0.2)

## 2021-02-07 LAB — COMPREHENSIVE METABOLIC PANEL
ALT: 16 U/L (ref 0–44)
AST: 17 U/L (ref 15–41)
Albumin: 3.7 g/dL (ref 3.5–5.0)
Alkaline Phosphatase: 99 U/L (ref 38–126)
Anion gap: 7 (ref 5–15)
BUN: 23 mg/dL (ref 8–23)
CO2: 26 mmol/L (ref 22–32)
Calcium: 7.8 mg/dL — ABNORMAL LOW (ref 8.9–10.3)
Chloride: 102 mmol/L (ref 98–111)
Creatinine, Ser: 1.03 mg/dL (ref 0.61–1.24)
GFR, Estimated: >60 mL/min (ref >60)
Glucose, Bld: 156 mg/dL — ABNORMAL HIGH (ref 70–99)
Potassium: 4.1 mmol/L (ref 3.5–5.1)
Sodium: 135 mmol/L (ref 135–145)
Total Bilirubin: 0.9 mg/dL (ref 0.3–1.2)
Total Protein: 6.1 g/dL — ABNORMAL LOW (ref 6.5–8.1)

## 2021-02-07 LAB — LACTATE DEHYDROGENASE: LDH: 145 U/L (ref 98–192)

## 2021-02-07 LAB — MAGNESIUM: Magnesium: 2 mg/dL (ref 1.7–2.4)

## 2021-02-07 MED ORDER — DIPHENHYDRAMINE HCL 25 MG PO CAPS
50.0000 mg | ORAL_CAPSULE | Freq: Once | ORAL | Status: AC
  Administered 2021-02-07: 50 mg via ORAL
  Filled 2021-02-07: qty 2

## 2021-02-07 MED ORDER — BORTEZOMIB CHEMO SQ INJECTION 3.5 MG (2.5MG/ML)
1.3000 mg/m2 | Freq: Once | INTRAMUSCULAR | Status: AC
  Administered 2021-02-07: 2.5 mg via SUBCUTANEOUS
  Filled 2021-02-07: qty 1

## 2021-02-07 MED ORDER — ACETAMINOPHEN 325 MG PO TABS
650.0000 mg | ORAL_TABLET | Freq: Once | ORAL | Status: AC
  Administered 2021-02-07: 650 mg via ORAL
  Filled 2021-02-07: qty 2

## 2021-02-07 MED ORDER — DEXAMETHASONE 4 MG PO TABS
40.0000 mg | ORAL_TABLET | Freq: Once | ORAL | Status: AC
  Administered 2021-02-07: 40 mg via ORAL
  Filled 2021-02-07: qty 10

## 2021-02-07 MED ORDER — DARATUMUMAB-HYALURONIDASE-FIHJ 1800-30000 MG-UT/15ML ~~LOC~~ SOLN
1800.0000 mg | Freq: Once | SUBCUTANEOUS | Status: AC
  Administered 2021-02-07: 1800 mg via SUBCUTANEOUS
  Filled 2021-02-07: qty 15

## 2021-02-07 NOTE — Progress Notes (Signed)
Pt here for velcade and [place] dara.  Labs and vitals signs WNL for treatment today.

## 2021-02-07 NOTE — Progress Notes (Signed)
Velcade and [place] dara given today per MD orders. Tolerated infusion without adverse affects. Vital signs stable. No complaints at this time. Discharged from clinic ambulatory in stable condition. Alert and oriented x 3. F/U with [facility] as scheduled.

## 2021-02-07 NOTE — Patient Instructions (Signed)
[facility]  Discharge Instructions: Thank you for choosing [facility] to provide your oncology and hematology care.  If you have a lab appointment with the [facility], please come in thru the Main Entrance and check in at the main information desk.  Wear comfortable clothing and clothing appropriate for easy access to any Portacath or PICC line.   We strive to give you quality time with your provider. You may need to reschedule your appointment if you arrive late (15 or more minutes).  Arriving late affects you and other patients whose appointments are after yours.  Also, if you miss three or more appointments without notifying the office, you may be dismissed from the clinic at the provider's discretion.      For prescription refill requests, have your pharmacy contact our office and allow 72 hours for refills to be completed.    Today you received the following chemotherapy and/or immunotherapy agents Velcade and Dara   To help prevent nausea and vomiting after your treatment, we encourage you to take your nausea medication as directed.  BELOW ARE SYMPTOMS THAT SHOULD BE REPORTED IMMEDIATELY: *FEVER GREATER THAN 100.4 F (38 C) OR HIGHER *CHILLS OR SWEATING *NAUSEA AND VOMITING THAT IS NOT CONTROLLED WITH YOUR NAUSEA MEDICATION *UNUSUAL SHORTNESS OF BREATH *UNUSUAL BRUISING OR BLEEDING *URINARY PROBLEMS (pain or burning when urinating, or frequent urination) *BOWEL PROBLEMS (unusual diarrhea, constipation, pain near the anus) TENDERNESS IN MOUTH AND THROAT WITH OR WITHOUT PRESENCE OF ULCERS (sore throat, sores in mouth, or a toothache) UNUSUAL RASH, SWELLING OR PAIN  UNUSUAL VAGINAL DISCHARGE OR ITCHING   Items with * indicate a potential emergency and should be followed up as soon as possible or go to the Emergency Department if any problems should occur.  Please show the CHEMOTHERAPY ALERT CARD or IMMUNOTHERAPY ALERT CARD at check-in to the Emergency  Department and triage nurse.  Should you have questions after your visit or need to cancel or reschedule your appointment, please contact [facility] [phone]  and follow the prompts.  Office hours are 8:00 a.m. to 4:30 p.m. Monday - Friday. Please note that voicemails left after 4:00 p.m. may not be returned until the following business day.  We are closed weekends and major holidays. You have access to a nurse at all times for urgent questions. Please call the main number to the clinic [phone] and follow the prompts.  For any non-urgent questions, you may also contact your provider using MyChart. We now offer e-Visits for anyone 64 and older to request care online for non-urgent symptoms. For details visit [email or web address].   Also download the MyChart app! Go to the app store, search "MyChart", open the app, select [facility], and log in with your MyChart username and password.  Due to Covid, a mask is required upon entering the hospital/clinic. If you do not have a mask, one will be given to you upon arrival. For doctor visits, patients may have 1 support person aged 64 or older with them. For treatment visits, patients cannot have anyone with them due to current Covid guidelines and our immunocompromised population.

## 2021-02-08 LAB — KAPPA/LAMBDA LIGHT CHAINS
Kappa free light chain: 16 mg/L (ref 3.3–19.4)
Kappa, lambda light chain ratio: 2.03 — ABNORMAL HIGH (ref 0.26–1.65)
Lambda free light chains: 7.9 mg/L (ref 5.7–26.3)

## 2021-02-09 LAB — PROTEIN ELECTROPHORESIS, SERUM
A/G Ratio: 1.6 (ref 0.7–1.7)
Albumin ELP: 3.4 g/dL (ref 2.9–4.4)
Alpha-1-Globulin: 0.2 g/dL (ref 0.0–0.4)
Alpha-2-Globulin: 0.7 g/dL (ref 0.4–1.0)
Beta Globulin: 0.8 g/dL (ref 0.7–1.3)
Gamma Globulin: 0.4 g/dL (ref 0.4–1.8)
Globulin, Total: 2.1 g/dL — ABNORMAL LOW (ref 2.2–3.9)
M-Spike, %: 0.1 g/dL — ABNORMAL HIGH
Total Protein ELP: 5.5 g/dL — ABNORMAL LOW (ref 6.0–8.5)

## 2021-02-13 ENCOUNTER — Encounter (HOSPITAL_COMMUNITY): Payer: Self-pay

## 2021-02-13 NOTE — Progress Notes (Signed)
[facility] [address], [place] [address]   CLINIC:  Medical Oncology/Hematology  PCP:  Jac Canavan, PA-C [address] / [place] [address] [phone]   REASON FOR VISIT:  Follow-up for multiple myeloma  PRIOR THERAPY: none  NGS Results: not done  CURRENT THERAPY:  Da DaraBorD every 3 weeks; Revlimid 15 mg 2/3 weeks raBorD every 3 weeks; Revlimid 15 mg 2/3 weeks  BRIEF ONCOLOGIC HISTORY:  Oncology History  Multiple myeloma not having achieved remission (HCC)  11/01/2020 Initial Diagnosis   Multiple myeloma not having achieved remission (HCC)    11/02/2020 - 11/02/2020 Chemotherapy          11/21/2020 -  Chemotherapy    Patient is on Treatment Plan: MYELOMA NEWLY DIAGNOSED TRANSPLANT CANDIDATE DARAVRD (DARATUMUMAB SQ) Q21D X 6 CYCLES (INDUCTION/CONSOLIDATION)         CANCER STAGING: Cancer Staging No matching staging information was found for the patient.  INTERVAL HISTORY:  Mr. Renel Ende, a 65 y.o. male, returns for routine follow-up and consideration for next cycle of chemotherapy. Shreyas was last seen on 12/13/20.  Due for cycle #5 of DaraVRd today.   Overall, he tells me he has been feeling pretty well. He denies any n/v/d, numbness or tingling, new pains, and ankle swellings, and he reports good energy and activity levels.   Overall, he feels ready for next cycle of chemo today.   REVIEW OF SYSTEMS:  Review of Systems  Constitutional:  Negative for appetite change and fatigue.  Cardiovascular:  Negative for leg swelling.  Gastrointestinal:  Negative for diarrhea and nausea.  Neurological:  Negative for numbness.  All other systems reviewed and are negative.  PAST MEDICAL/SURGICAL HISTORY:  Past Medical History:  Diagnosis Date   Broken leg    Colonoscopy refused 09/2017   Hyperlipidemia    Hypertension    Multiple myeloma (HCC)    Pneumonia    Refuses treatment 09/2017   refuses screenings  such as cancer screening, refuses vaccines, refuses normal preventative care   Vaccine refused by patient    all vaccines as of 09/2017   Past Surgical History:  Procedure Laterality Date   COLONOSCOPY     never, declines as of 09/2017    SOCIAL HISTORY:  Social History   Socioeconomic History   Marital status: Divorced    Spouse name: Not on file   Number of children: 1   Years of education: Not on file   Highest education level: Not on file  Occupational History   Occupation: Disability  Tobacco Use   Smoking status: Never   Smokeless tobacco: Never  Substance and Sexual Activity   Alcohol use: No   Drug use: Never   Sexual activity: Not Currently  Other Topics Concern   Not on file  Social History Narrative   Not on file   Social Determinants of Health   Financial Resource Strain: Medium Risk   Difficulty of Paying Living Expenses: Somewhat hard  Food Insecurity: No Food Insecurity   Worried About Programme researcher, broadcasting/film/video in the Last Year: Never true   Ran Out of Food in the Last Year: Never true  Transportation Needs: No Transportation Needs   Lack of Transportation (Medical): No   Lack of Transportation (Non-Medical): No  Physical Activity: Insufficiently Active   Days of Exercise per Week: 5 days   Minutes of Exercise per Session: 20 min  Stress: No Stress Concern Present   Feeling of  Stress : Not at all  Social Connections: Socially Isolated   Frequency of Communication with Friends and Family: More than three times a week   Frequency of Social Gatherings with Friends and Family: More than three times a week   Attends Religious Services: Never   Marine scientist or Organizations: No   Attends Music therapist: Never   Marital Status: Divorced  Human resources officer Violence: Not At Risk   Fear of Current or Ex-Partner: No   Emotionally Abused: No   Physically Abused: No   Sexually Abused: No    FAMILY HISTORY:  No family history on  file.  CURRENT MEDICATIONS:  Current Outpatient Medications  Medication Sig Dispense Refill   acyclovir (ZOVIRAX) 400 MG tablet Take 1 tablet (400 mg total) by mouth 2 (two) times daily. 60 tablet 6   amLODipine (NORVASC) 10 MG tablet Take 1 tablet (10 mg total) by mouth daily. 30 tablet 3   aspirin 81 MG chewable tablet Chew by mouth daily.     feeding supplement (ENSURE ENLIVE / ENSURE PLUS) LIQD Take 237 mLs by mouth 3 (three) times daily between meals. 237 mL 12   lenalidomide (REVLIMID) 15 MG capsule Take 1 capsule (15 mg total) by mouth daily. 14 days on, 7 days off 14 capsule 0   acetaminophen (TYLENOL) 325 MG tablet Take 2 tablets (650 mg total) by mouth every 6 (six) hours as needed for mild pain (or Fever >/= 101). (Patient not taking: Reported on 02/14/2021) 30 tablet 30   calcium-vitamin D (OSCAL WITH D) 500-200 MG-UNIT tablet Take 1 tablet by mouth 2 (two) times daily. 60 tablet 0   No current facility-administered medications for this visit.    ALLERGIES:  No Known Allergies  PHYSICAL EXAM:  Performance status (ECOG): 1 - Symptomatic but completely ambulatory  Vitals:   02/14/21 0949  BP: (!) 182/72  Pulse: 72  Resp: 16  Temp: 98.5 F (36.9 C)  SpO2: 99%   Wt Readings from Last 3 Encounters:  02/14/21 174 lb 14.4 oz (79.3 kg)  02/07/21 174 lb 9.6 oz (79.2 kg)  01/31/21 176 lb 6.4 oz (80 kg)   Physical Exam Vitals reviewed.  Constitutional:      Appearance: Normal appearance.  Cardiovascular:     Rate and Rhythm: Normal rate and regular rhythm.     Pulses: Normal pulses.     Heart sounds: Normal heart sounds.  Pulmonary:     Effort: Pulmonary effort is normal.     Breath sounds: Normal breath sounds.  Neurological:     General: No focal deficit present.     Mental Status: He is alert and oriented to person, place, and time.  Psychiatric:        Mood and Affect: Mood normal.        Behavior: Behavior normal.    LABORATORY DATA:  I have reviewed the  labs as listed.  CBC Latest Ref Rng & Units 02/14/2021 02/07/2021 01/31/2021  WBC 4.0 - 10.5 K/uL 9.0 10.3 8.2  Hemoglobin 13.0 - 17.0 g/dL 11.7(L) 12.3(L) 11.9(L)  Hematocrit 39.0 - 52.0 % 36.7(L) 39.0 37.2(L)  Platelets 150 - 400 K/uL 197 194 162   CMP Latest Ref Rng & Units 02/14/2021 02/07/2021 01/31/2021  Glucose 70 - 99 mg/dL 135(H) 156(H) 132(H)  BUN 8 - 23 mg/dL 36(H) 23 25(H)  Creatinine 0.61 - 1.24 mg/dL 1.00 1.03 1.05  Sodium 135 - 145 mmol/L 139 135 137  Potassium 3.5 -  5.1 mmol/L 4.3 4.1 4.2  Chloride 98 - 111 mmol/L 106 102 100  CO2 22 - 32 mmol/L $RemoveB'27 26 29  'CVjJjynw$ Calcium 8.9 - 10.3 mg/dL 8.4(L) 7.8(L) 8.3(L)  Total Protein 6.5 - 8.1 g/dL 6.0(L) 6.1(L) 6.1(L)  Total Bilirubin 0.3 - 1.2 mg/dL 0.6 0.9 0.9  Alkaline Phos 38 - 126 U/L 106 99 113  AST 15 - 41 U/L $Remo'17 17 16  'uXtXa$ ALT 0 - 44 U/L $Remo'17 16 15    'SDuMe$ DIAGNOSTIC IMAGING:  I have independently reviewed the scans and discussed with the patient. No results found.   ASSESSMENT:  1.  IgG kappa light chain multiple myeloma: - Admission with hypercalcemia and renal failure. - CT CAP showed extensive lytic foci throughout the axial and appendicular skeleton of the chest, abdomen and pelvis.  Splenomegaly with an enlarged lobular spleen, nonspecific. - Bone marrow biopsy on 11/01/2020 shows 85% atypical plasma cells in the aspirate. - FISH panel positive for 1 p-,-13,14q-,16q-,17p-/-17 and 20q- - Cytogenetics-no metaphases available for analysis. - SPEP with 5.4 g of M spike.  Free light chain ratio 1030.  LDH normal.  Beta-2 microglobulin 18.7.  24-hour urine total protein 3.6 g. - Dara VRD started on 11/21/2020.  Revlimid 15 mg 2 weeks on 1 week off started on 11/22/2020.   2.  Social/family history: - He worked as a Administrator.  He lives by himself. - Mother with breast and ovarian cancer.  Father had metastatic kidney cancer.  His brother's daughter has CML.   PLAN:  1.  IgG kappa light chain multiple myeloma: - We have discussed myeloma  panel from 02/07/2021.  M spike improved to 0.1 from 5.4 g prior to therapy.  Free light chain ratio is 2.03, down from 1278.  Kappa light chains are 16. - He has completed 4 cycles of Dara RVD. - We have made a referral to B[facility] specialist at [facility].  He has an appointment to see Dr. Montel Clock on 02/24/2021. - I have told him to discontinue Revlimid as it might interfere with stem cell mobilization. - His Dara will be switched to every 2 weeks for this cycle.  We will continue Velcade.  He is receiving dexamethasone 40 mg weekly. - RTC 3 weeks for follow-up.   2.  Myeloma bone disease: - Continue denosumab monthly. - Continue calcium and vitamin D supplements.  3.  ID prophylaxis: - Continue acyclovir twice daily.  Continue aspirin 81 mg daily for thromboprophylaxis.   Orders placed this encounter:  No orders of the defined types were placed in this encounter.    Derek Jack, MD [place] [phone]   I, Thana Ates, am acting as a scribe for Dr. Derek Jack.  I, Derek Jack MD, have reviewed the above documentation for accuracy and completeness, and I agree with the above.

## 2021-02-14 ENCOUNTER — Inpatient Hospital Stay (HOSPITAL_COMMUNITY): Payer: Medicaid Other

## 2021-02-14 ENCOUNTER — Encounter (HOSPITAL_COMMUNITY): Payer: Self-pay

## 2021-02-14 ENCOUNTER — Other Ambulatory Visit: Payer: Self-pay

## 2021-02-14 ENCOUNTER — Other Ambulatory Visit (HOSPITAL_COMMUNITY): Payer: Self-pay

## 2021-02-14 ENCOUNTER — Inpatient Hospital Stay (HOSPITAL_BASED_OUTPATIENT_CLINIC_OR_DEPARTMENT_OTHER): Payer: Medicaid Other | Admitting: Hematology

## 2021-02-14 ENCOUNTER — Inpatient Hospital Stay (HOSPITAL_COMMUNITY): Payer: Medicaid Other | Attending: Hematology

## 2021-02-14 VITALS — BP 182/72 | HR 72 | Temp 98.5°F | Resp 16 | Wt 174.9 lb

## 2021-02-14 DIAGNOSIS — Z79899 Other long term (current) drug therapy: Secondary | ICD-10-CM | POA: Diagnosis not present

## 2021-02-14 DIAGNOSIS — Z5112 Encounter for antineoplastic immunotherapy: Secondary | ICD-10-CM | POA: Insufficient documentation

## 2021-02-14 DIAGNOSIS — C9000 Multiple myeloma not having achieved remission: Secondary | ICD-10-CM

## 2021-02-14 LAB — CBC WITH DIFFERENTIAL/PLATELET
Abs Immature Granulocytes: 0.05 10*3/uL (ref 0.00–0.07)
Basophils Absolute: 0.1 10*3/uL (ref 0.0–0.1)
Basophils Relative: 1 %
Eosinophils Absolute: 0.1 10*3/uL (ref 0.0–0.5)
Eosinophils Relative: 1 %
HCT: 36.7 % — ABNORMAL LOW (ref 39.0–52.0)
Hemoglobin: 11.7 g/dL — ABNORMAL LOW (ref 13.0–17.0)
Immature Granulocytes: 1 %
Lymphocytes Relative: 14 %
Lymphs Abs: 1.2 10*3/uL (ref 0.7–4.0)
MCH: 28.5 pg (ref 26.0–34.0)
MCHC: 31.9 g/dL (ref 30.0–36.0)
MCV: 89.3 fL (ref 80.0–100.0)
Monocytes Absolute: 1.3 10*3/uL — ABNORMAL HIGH (ref 0.1–1.0)
Monocytes Relative: 15 %
Neutro Abs: 6.3 10*3/uL (ref 1.7–7.7)
Neutrophils Relative %: 68 %
Platelets: 197 10*3/uL (ref 150–400)
RBC: 4.11 MIL/uL — ABNORMAL LOW (ref 4.22–5.81)
RDW: 15.3 % (ref 11.5–15.5)
WBC: 9 10*3/uL (ref 4.0–10.5)
nRBC: 0 % (ref 0.0–0.2)

## 2021-02-14 LAB — COMPREHENSIVE METABOLIC PANEL
ALT: 17 U/L (ref 0–44)
AST: 17 U/L (ref 15–41)
Albumin: 3.7 g/dL (ref 3.5–5.0)
Alkaline Phosphatase: 106 U/L (ref 38–126)
Anion gap: 6 (ref 5–15)
BUN: 36 mg/dL — ABNORMAL HIGH (ref 8–23)
CO2: 27 mmol/L (ref 22–32)
Calcium: 8.4 mg/dL — ABNORMAL LOW (ref 8.9–10.3)
Chloride: 106 mmol/L (ref 98–111)
Creatinine, Ser: 1 mg/dL (ref 0.61–1.24)
GFR, Estimated: >60 mL/min (ref >60)
Glucose, Bld: 135 mg/dL — ABNORMAL HIGH (ref 70–99)
Potassium: 4.3 mmol/L (ref 3.5–5.1)
Sodium: 139 mmol/L (ref 135–145)
Total Bilirubin: 0.6 mg/dL (ref 0.3–1.2)
Total Protein: 6 g/dL — ABNORMAL LOW (ref 6.5–8.1)

## 2021-02-14 MED ORDER — LENALIDOMIDE 15 MG PO CAPS: 15.0000 mg | ORAL_CAPSULE | Freq: Every day | ORAL | 0 refills | Status: DC

## 2021-02-14 MED ORDER — DEXAMETHASONE 4 MG PO TABS
40.0000 mg | ORAL_TABLET | Freq: Once | ORAL | Status: AC
  Administered 2021-02-14: 40 mg via ORAL
  Filled 2021-02-14: qty 10

## 2021-02-14 MED ORDER — BORTEZOMIB CHEMO SQ INJECTION 3.5 MG (2.5MG/ML)
1.3000 mg/m2 | Freq: Once | INTRAMUSCULAR | Status: AC
  Administered 2021-02-14: 2.5 mg via SUBCUTANEOUS
  Filled 2021-02-14: qty 1

## 2021-02-14 MED ORDER — ACETAMINOPHEN 325 MG PO TABS
650.0000 mg | ORAL_TABLET | Freq: Once | ORAL | Status: AC
  Administered 2021-02-14: 650 mg via ORAL
  Filled 2021-02-14: qty 2

## 2021-02-14 MED ORDER — DARATUMUMAB-HYALURONIDASE-FIHJ 1800-30000 MG-UT/15ML ~~LOC~~ SOLN
1800.0000 mg | Freq: Once | SUBCUTANEOUS | Status: AC
  Administered 2021-02-14: 1800 mg via SUBCUTANEOUS
  Filled 2021-02-14: qty 15

## 2021-02-14 MED ORDER — DIPHENHYDRAMINE HCL 25 MG PO CAPS
50.0000 mg | ORAL_CAPSULE | Freq: Once | ORAL | Status: AC
  Administered 2021-02-14: 50 mg via ORAL
  Filled 2021-02-14: qty 2

## 2021-02-14 NOTE — Progress Notes (Signed)
Patient has been assessed and labs reviewed by Dr. Delton Coombes. Okay to proceed with treatment today per Dr. Delton Coombes. Primary RN and Pharmacy made aware.

## 2021-02-14 NOTE — Patient Instructions (Addendum)
[facility] at [facility] Discharge Instructions  You were seen and examined today by Dr. Delton Coombes. Dr. Delton Coombes discussed Bone Marrow Transplant with you today. This is the best treatment option for long-term management of your Multiple Myeloma. This will be done at [facility]. When you go for transplant consultation, it is highly encouraged that you take a family member with you so that they are able to assist you through the entire transplant process.   Dr. Delton Coombes has recommended that you stop Revlimid in preparation for bone marrow transplant. You will continue getting the injections here in the cancer center.  Follow-up at scheduled.   Thank you for choosing [facility] to provide your oncology and hematology care.  To afford each patient quality time with our provider, please arrive at least 15 minutes before your scheduled appointment time.   If you have a lab appointment with the [facility] please come in thru the Main Entrance and check in at the main information desk.  You need to re-schedule your appointment should you arrive 10 or more minutes late.  We strive to give you quality time with our providers, and arriving late affects you and other patients whose appointments are after yours.  Also, if you no show three or more times for appointments you may be dismissed from the clinic at the providers discretion.     Again, thank you for choosing [facility].  Our hope is that these requests will decrease the amount of time that you wait before being seen by our physicians.       _____________________________________________________________  Should you have questions after your visit to [facility], please contact our office at [phone] and follow the prompts.  Our office hours are 8:00 a.m. and 4:30 p.m. Monday - Friday.  Please note that voicemails left after 4:00 p.m. may not  be returned until the following business day.  We are closed weekends and major holidays.  You do have access to a nurse 24-7, just call the main number to the clinic [phone] and do not press any options, hold on the line and a nurse will answer the phone.    For prescription refill requests, have your pharmacy contact our office and allow 72 hours.    Due to Covid, you will need to wear a mask upon entering the hospital. If you do not have a mask, a mask will be given to you at the Main Entrance upon arrival. For doctor visits, patients may have 1 support person age 25 or older with them. For treatment visits, patients can not have anyone with them due to social distancing guidelines and our immunocompromised population.

## 2021-02-14 NOTE — Patient Instructions (Signed)
[facility]  Discharge Instructions: Thank you for choosing [facility] to provide your oncology and hematology care.  If you have a lab appointment with the [facility], please come in thru the Main Entrance and check in at the main information desk.  Wear comfortable clothing and clothing appropriate for easy access to any Portacath or PICC line.   We strive to give you quality time with your provider. You may need to reschedule your appointment if you arrive late (15 or more minutes).  Arriving late affects you and other patients whose appointments are after yours.  Also, if you miss three or more appointments without notifying the office, you may be dismissed from the clinic at the provider's discretion.      For prescription refill requests, have your pharmacy contact our office and allow 72 hours for refills to be completed.    Today you received the following chemotherapy and/or immunotherapy agents: Velcade & Daratumumab. Return as scheduled.   To help prevent nausea and vomiting after your treatment, we encourage you to take your nausea medication as directed.  BELOW ARE SYMPTOMS THAT SHOULD BE REPORTED IMMEDIATELY: *FEVER GREATER THAN 100.4 F (38 C) OR HIGHER *CHILLS OR SWEATING *NAUSEA AND VOMITING THAT IS NOT CONTROLLED WITH YOUR NAUSEA MEDICATION *UNUSUAL SHORTNESS OF BREATH *UNUSUAL BRUISING OR BLEEDING *URINARY PROBLEMS (pain or burning when urinating, or frequent urination) *BOWEL PROBLEMS (unusual diarrhea, constipation, pain near the anus) TENDERNESS IN MOUTH AND THROAT WITH OR WITHOUT PRESENCE OF ULCERS (sore throat, sores in mouth, or a toothache) UNUSUAL RASH, SWELLING OR PAIN  UNUSUAL VAGINAL DISCHARGE OR ITCHING   Items with * indicate a potential emergency and should be followed up as soon as possible or go to the Emergency Department if any problems should occur.  Please show the CHEMOTHERAPY ALERT CARD or IMMUNOTHERAPY ALERT CARD at  check-in to the Emergency Department and triage nurse.  Should you have questions after your visit or need to cancel or reschedule your appointment, please contact [facility] [phone]  and follow the prompts.  Office hours are 8:00 a.m. to 4:30 p.m. Monday - Friday. Please note that voicemails left after 4:00 p.m. may not be returned until the following business day.  We are closed weekends and major holidays. You have access to a nurse at all times for urgent questions. Please call the main number to the clinic [phone] and follow the prompts.  For any non-urgent questions, you may also contact your provider using MyChart. We now offer e-Visits for anyone 56 and older to request care online for non-urgent symptoms. For details visit [email or web address].   Also download the MyChart app! Go to the app store, search "MyChart", open the app, select [place], and log in with your MyChart username and password.  Due to Covid, a mask is required upon entering the hospital/clinic. If you do not have a mask, one will be given to you upon arrival. For doctor visits, patients may have 1 support person aged 4 or older with them. For treatment visits, patients cannot have anyone with them due to current Covid guidelines and our immunocompromised population.

## 2021-02-14 NOTE — Telephone Encounter (Signed)
Chart reviewed. Revlimid refilled per last office visit with Dr. Delton Coombes

## 2021-02-14 NOTE — Progress Notes (Signed)
Patient presents today for Velcade and Daratumumab. Okay to proceed with treatment per Dr. Delton Coombes.  Patient tolerated Velcade and Daratumumab injection with no complaints voiced. See MAR for details. Lab reviewed. Injection site clean and dry with no bruising or swelling noted at site. Band aid applied. Patient given office visit AVS and new schedule. Vss with discharge and left ambulatory in satisfactory condition with no s/s of distress noted.

## 2021-02-15 ENCOUNTER — Encounter (HOSPITAL_COMMUNITY): Payer: Self-pay | Admitting: Hematology

## 2021-02-17 ENCOUNTER — Encounter (HOSPITAL_COMMUNITY): Payer: Self-pay | Admitting: Hematology

## 2021-02-21 ENCOUNTER — Inpatient Hospital Stay (HOSPITAL_COMMUNITY): Payer: Medicaid Other

## 2021-02-21 ENCOUNTER — Encounter (HOSPITAL_COMMUNITY): Payer: Self-pay

## 2021-02-21 ENCOUNTER — Other Ambulatory Visit: Payer: Self-pay

## 2021-02-21 VITALS — BP 176/80 | HR 67 | Temp 97.1°F | Resp 17 | Wt 181.0 lb

## 2021-02-21 DIAGNOSIS — C9000 Multiple myeloma not having achieved remission: Secondary | ICD-10-CM

## 2021-02-21 DIAGNOSIS — Z5112 Encounter for antineoplastic immunotherapy: Secondary | ICD-10-CM | POA: Diagnosis not present

## 2021-02-21 LAB — COMPREHENSIVE METABOLIC PANEL
ALT: 16 U/L (ref 0–44)
AST: 17 U/L (ref 15–41)
Albumin: 3.6 g/dL (ref 3.5–5.0)
Alkaline Phosphatase: 113 U/L (ref 38–126)
Anion gap: 8 (ref 5–15)
BUN: 29 mg/dL — ABNORMAL HIGH (ref 8–23)
CO2: 27 mmol/L (ref 22–32)
Calcium: 8.1 mg/dL — ABNORMAL LOW (ref 8.9–10.3)
Chloride: 103 mmol/L (ref 98–111)
Creatinine, Ser: 1.15 mg/dL (ref 0.61–1.24)
GFR, Estimated: >60 mL/min (ref >60)
Glucose, Bld: 145 mg/dL — ABNORMAL HIGH (ref 70–99)
Potassium: 3.7 mmol/L (ref 3.5–5.1)
Sodium: 138 mmol/L (ref 135–145)
Total Bilirubin: 0.9 mg/dL (ref 0.3–1.2)
Total Protein: 6 g/dL — ABNORMAL LOW (ref 6.5–8.1)

## 2021-02-21 LAB — CBC WITH DIFFERENTIAL/PLATELET
Abs Immature Granulocytes: 0.1 10*3/uL — ABNORMAL HIGH (ref 0.00–0.07)
Basophils Absolute: 0 10*3/uL (ref 0.0–0.1)
Basophils Relative: 0 %
Eosinophils Absolute: 0.4 10*3/uL (ref 0.0–0.5)
Eosinophils Relative: 4 %
HCT: 36.7 % — ABNORMAL LOW (ref 39.0–52.0)
Hemoglobin: 11.8 g/dL — ABNORMAL LOW (ref 13.0–17.0)
Immature Granulocytes: 1 %
Lymphocytes Relative: 12 %
Lymphs Abs: 1 10*3/uL (ref 0.7–4.0)
MCH: 28 pg (ref 26.0–34.0)
MCHC: 32.2 g/dL (ref 30.0–36.0)
MCV: 87.2 fL (ref 80.0–100.0)
Monocytes Absolute: 1 10*3/uL (ref 0.1–1.0)
Monocytes Relative: 11 %
Neutro Abs: 6.1 10*3/uL (ref 1.7–7.7)
Neutrophils Relative %: 72 %
Platelets: 148 10*3/uL — ABNORMAL LOW (ref 150–400)
RBC: 4.21 MIL/uL — ABNORMAL LOW (ref 4.22–5.81)
RDW: 15.8 % — ABNORMAL HIGH (ref 11.5–15.5)
WBC: 8.6 10*3/uL (ref 4.0–10.5)
nRBC: 0 % (ref 0.0–0.2)

## 2021-02-21 MED ORDER — BORTEZOMIB CHEMO SQ INJECTION 3.5 MG (2.5MG/ML)
1.3000 mg/m2 | Freq: Once | INTRAMUSCULAR | Status: AC
  Administered 2021-02-21: 2.5 mg via SUBCUTANEOUS
  Filled 2021-02-21: qty 1

## 2021-02-21 MED ORDER — PROCHLORPERAZINE MALEATE 10 MG PO TABS
10.0000 mg | ORAL_TABLET | Freq: Once | ORAL | Status: AC
  Administered 2021-02-21: 10 mg via ORAL
  Filled 2021-02-21: qty 1

## 2021-02-21 NOTE — Progress Notes (Signed)
Patient presents today for treatment. Velcade injection . Vital signs within parameters for treatment. MAR reviewed and updated. Patient states he is taking Revlimid as prescribed with no side effects noted. Patient states his brother gives his medicine to him per patient's words. Patient denies any significant changes.   Treatment given today per MD orders. Tolerated without adverse affects. Vital signs stable. No complaints at this time. Discharged from clinic ambulatory in stable condition. Alert and oriented x 3. F/U with [facility] as scheduled.

## 2021-02-21 NOTE — Patient Instructions (Signed)
[facility]  Discharge Instructions: Thank you for choosing [facility] to provide your oncology and hematology care.  If you have a lab appointment with the [facility], please come in thru the Main Entrance and check in at the main information desk.  Wear comfortable clothing and clothing appropriate for easy access to any Portacath or PICC line.   We strive to give you quality time with your provider. You may need to reschedule your appointment if you arrive late (15 or more minutes).  Arriving late affects you and other patients whose appointments are after yours.  Also, if you miss three or more appointments without notifying the office, you may be dismissed from the clinic at the provider's discretion.      For prescription refill requests, have your pharmacy contact our office and allow 72 hours for refills to be completed.    Today you received the following chemotherapy and/or immunotherapy agents Velcade.       To help prevent nausea and vomiting after your treatment, we encourage you to take your nausea medication as directed.  BELOW ARE SYMPTOMS THAT SHOULD BE REPORTED IMMEDIATELY: *FEVER GREATER THAN 100.4 F (38 C) OR HIGHER *CHILLS OR SWEATING *NAUSEA AND VOMITING THAT IS NOT CONTROLLED WITH YOUR NAUSEA MEDICATION *UNUSUAL SHORTNESS OF BREATH *UNUSUAL BRUISING OR BLEEDING *URINARY PROBLEMS (pain or burning when urinating, or frequent urination) *BOWEL PROBLEMS (unusual diarrhea, constipation, pain near the anus) TENDERNESS IN MOUTH AND THROAT WITH OR WITHOUT PRESENCE OF ULCERS (sore throat, sores in mouth, or a toothache) UNUSUAL RASH, SWELLING OR PAIN  UNUSUAL VAGINAL DISCHARGE OR ITCHING   Items with * indicate a potential emergency and should be followed up as soon as possible or go to the Emergency Department if any problems should occur.  Please show the CHEMOTHERAPY ALERT CARD or IMMUNOTHERAPY ALERT CARD at check-in to the Emergency  Department and triage nurse.  Should you have questions after your visit or need to cancel or reschedule your appointment, please contact [facility] [phone]  and follow the prompts.  Office hours are 8:00 a.m. to 4:30 p.m. Monday - Friday. Please note that voicemails left after 4:00 p.m. may not be returned until the following business day.  We are closed weekends and major holidays. You have access to a nurse at all times for urgent questions. Please call the main number to the clinic [phone] and follow the prompts.  For any non-urgent questions, you may also contact your provider using MyChart. We now offer e-Visits for anyone 37 and older to request care online for non-urgent symptoms. For details visit [email or web address].   Also download the MyChart app! Go to the app store, search "MyChart", open the app, select [place] [facility], and log in with your MyChart username and password.  Due to Covid, a mask is required upon entering the hospital/clinic. If you do not have a mask, one will be given to you upon arrival. For doctor visits, patients may have 1 support person aged 84 or older with them. For treatment visits, patients cannot have anyone with them due to current Covid guidelines and our immunocompromised population.

## 2021-02-23 ENCOUNTER — Encounter (HOSPITAL_COMMUNITY): Payer: Self-pay

## 2021-02-24 ENCOUNTER — Telehealth: Payer: Self-pay | Admitting: Hematology and Oncology

## 2021-02-24 NOTE — Telephone Encounter (Signed)
Received a pt referral from [facility] for a 2nd opinion for multiple myeloma. Paul Norton has been scheduled to see Dr. Lorenso Courier on 8/25 at [facility] [place] date and time has been given to the pt's sister in law. Aware to arrive 20 minutes early.

## 2021-02-27 ENCOUNTER — Encounter (HOSPITAL_COMMUNITY): Payer: Self-pay

## 2021-02-27 ENCOUNTER — Other Ambulatory Visit (HOSPITAL_COMMUNITY): Payer: Self-pay

## 2021-02-28 ENCOUNTER — Encounter (HOSPITAL_COMMUNITY): Payer: Self-pay

## 2021-02-28 ENCOUNTER — Other Ambulatory Visit: Payer: Self-pay

## 2021-02-28 ENCOUNTER — Inpatient Hospital Stay (HOSPITAL_COMMUNITY): Payer: Medicaid Other

## 2021-02-28 ENCOUNTER — Other Ambulatory Visit (HOSPITAL_COMMUNITY): Payer: Self-pay

## 2021-02-28 VITALS — BP 180/70 | HR 73 | Temp 97.0°F | Resp 17 | Wt 178.8 lb

## 2021-02-28 DIAGNOSIS — C9000 Multiple myeloma not having achieved remission: Secondary | ICD-10-CM

## 2021-02-28 DIAGNOSIS — Z5112 Encounter for antineoplastic immunotherapy: Secondary | ICD-10-CM | POA: Diagnosis not present

## 2021-02-28 LAB — CBC WITH DIFFERENTIAL/PLATELET
Abs Immature Granulocytes: 0.03 10*3/uL (ref 0.00–0.07)
Basophils Absolute: 0 10*3/uL (ref 0.0–0.1)
Basophils Relative: 1 %
Eosinophils Absolute: 0.2 10*3/uL (ref 0.0–0.5)
Eosinophils Relative: 2 %
HCT: 35.8 % — ABNORMAL LOW (ref 39.0–52.0)
Hemoglobin: 11.3 g/dL — ABNORMAL LOW (ref 13.0–17.0)
Immature Granulocytes: 0 %
Lymphocytes Relative: 10 %
Lymphs Abs: 0.7 10*3/uL (ref 0.7–4.0)
MCH: 27.6 pg (ref 26.0–34.0)
MCHC: 31.6 g/dL (ref 30.0–36.0)
MCV: 87.3 fL (ref 80.0–100.0)
Monocytes Absolute: 1.2 10*3/uL — ABNORMAL HIGH (ref 0.1–1.0)
Monocytes Relative: 17 %
Neutro Abs: 5.1 10*3/uL (ref 1.7–7.7)
Neutrophils Relative %: 70 %
Platelets: 172 10*3/uL (ref 150–400)
RBC: 4.1 MIL/uL — ABNORMAL LOW (ref 4.22–5.81)
RDW: 15.4 % (ref 11.5–15.5)
WBC: 7.2 10*3/uL (ref 4.0–10.5)
nRBC: 0 % (ref 0.0–0.2)

## 2021-02-28 LAB — COMPREHENSIVE METABOLIC PANEL
ALT: 16 U/L (ref 0–44)
AST: 18 U/L (ref 15–41)
Albumin: 3.4 g/dL — ABNORMAL LOW (ref 3.5–5.0)
Alkaline Phosphatase: 72 U/L (ref 38–126)
Anion gap: 7 (ref 5–15)
BUN: 20 mg/dL (ref 8–23)
CO2: 28 mmol/L (ref 22–32)
Calcium: 7.4 mg/dL — ABNORMAL LOW (ref 8.9–10.3)
Chloride: 103 mmol/L (ref 98–111)
Creatinine, Ser: 0.96 mg/dL (ref 0.61–1.24)
GFR, Estimated: >60 mL/min (ref >60)
Glucose, Bld: 130 mg/dL — ABNORMAL HIGH (ref 70–99)
Potassium: 4.1 mmol/L (ref 3.5–5.1)
Sodium: 138 mmol/L (ref 135–145)
Total Bilirubin: 0.8 mg/dL (ref 0.3–1.2)
Total Protein: 6.1 g/dL — ABNORMAL LOW (ref 6.5–8.1)

## 2021-02-28 LAB — LACTATE DEHYDROGENASE: LDH: 129 U/L (ref 98–192)

## 2021-02-28 LAB — MAGNESIUM: Magnesium: 1.9 mg/dL (ref 1.7–2.4)

## 2021-02-28 MED ORDER — CLONIDINE HCL 0.1 MG PO TABS
0.2000 mg | ORAL_TABLET | Freq: Once | ORAL | Status: AC
  Administered 2021-02-28: 0.2 mg via ORAL
  Filled 2021-02-28: qty 2

## 2021-02-28 MED ORDER — LENALIDOMIDE 15 MG PO CAPS: 15.0000 mg | ORAL_CAPSULE | Freq: Every day | ORAL | 0 refills | Status: DC

## 2021-02-28 MED ORDER — DEXAMETHASONE 4 MG PO TABS
40.0000 mg | ORAL_TABLET | Freq: Once | ORAL | Status: AC
  Administered 2021-02-28: 40 mg via ORAL
  Filled 2021-02-28: qty 10

## 2021-02-28 MED ORDER — DIPHENHYDRAMINE HCL 25 MG PO CAPS
50.0000 mg | ORAL_CAPSULE | Freq: Once | ORAL | Status: AC
  Administered 2021-02-28: 50 mg via ORAL
  Filled 2021-02-28: qty 2

## 2021-02-28 MED ORDER — ACETAMINOPHEN 325 MG PO TABS
650.0000 mg | ORAL_TABLET | Freq: Once | ORAL | Status: AC
  Administered 2021-02-28: 650 mg via ORAL
  Filled 2021-02-28: qty 2

## 2021-02-28 MED ORDER — BORTEZOMIB CHEMO SQ INJECTION 3.5 MG (2.5MG/ML)
1.3000 mg/m2 | Freq: Once | INTRAMUSCULAR | Status: AC
  Administered 2021-02-28: 2.5 mg via SUBCUTANEOUS
  Filled 2021-02-28: qty 1

## 2021-02-28 MED ORDER — DARATUMUMAB-HYALURONIDASE-FIHJ 1800-30000 MG-UT/15ML ~~LOC~~ SOLN
1800.0000 mg | Freq: Once | SUBCUTANEOUS | Status: AC
  Administered 2021-02-28: 1800 mg via SUBCUTANEOUS
  Filled 2021-02-28: qty 15

## 2021-02-28 NOTE — Patient Instructions (Signed)
[facility]  Discharge Instructions: Thank you for choosing [facility] to provide your oncology and hematology care.  If you have a lab appointment with the [facility], please come in thru the Main Entrance and check in at the main information desk.  Wear comfortable clothing and clothing appropriate for easy access to any Portacath or PICC line.   We strive to give you quality time with your provider. You may need to reschedule your appointment if you arrive late (15 or more minutes).  Arriving late affects you and other patients whose appointments are after yours.  Also, if you miss three or more appointments without notifying the office, you may be dismissed from the clinic at the provider's discretion.      For prescription refill requests, have your pharmacy contact our office and allow 72 hours for refills to be completed.    Today you received the following chemotherapy and/or immunotherapy agents Velcade and Darzalex Faspro [place].       To help prevent nausea and vomiting after your treatment, we encourage you to take your nausea medication as directed.  BELOW ARE SYMPTOMS THAT SHOULD BE REPORTED IMMEDIATELY: *FEVER GREATER THAN 100.4 F (38 C) OR HIGHER *CHILLS OR SWEATING *NAUSEA AND VOMITING THAT IS NOT CONTROLLED WITH YOUR NAUSEA MEDICATION *UNUSUAL SHORTNESS OF BREATH *UNUSUAL BRUISING OR BLEEDING *URINARY PROBLEMS (pain or burning when urinating, or frequent urination) *BOWEL PROBLEMS (unusual diarrhea, constipation, pain near the anus) TENDERNESS IN MOUTH AND THROAT WITH OR WITHOUT PRESENCE OF ULCERS (sore throat, sores in mouth, or a toothache) UNUSUAL RASH, SWELLING OR PAIN  UNUSUAL VAGINAL DISCHARGE OR ITCHING   Items with * indicate a potential emergency and should be followed up as soon as possible or go to the Emergency Department if any problems should occur.  Please show the CHEMOTHERAPY ALERT CARD or IMMUNOTHERAPY ALERT CARD at check-in  to the Emergency Department and triage nurse.  Should you have questions after your visit or need to cancel or reschedule your appointment, please contact [facility] [phone]  and follow the prompts.  Office hours are 8:00 a.m. to 4:30 p.m. Monday - Friday. Please note that voicemails left after 4:00 p.m. may not be returned until the following business day.  We are closed weekends and major holidays. You have access to a nurse at all times for urgent questions. Please call the main number to the clinic [phone] and follow the prompts.  For any non-urgent questions, you may also contact your provider using MyChart. We now offer e-Visits for anyone 64 and older to request care online for non-urgent symptoms. For details visit [email or web address].   Also download the MyChart app! Go to the app store, search "MyChart", open the app, select , and log in with your MyChart username and password.  Due to Covid, a mask is required upon entering the hospital/clinic. If you do not have a mask, one will be given to you upon arrival. For doctor visits, patients may have 1 support person aged 64 or older with them. For treatment visits, patients cannot have anyone with them due to current Covid guidelines and our immunocompromised population.

## 2021-02-28 NOTE — Telephone Encounter (Signed)
Call placed to [facility] by [employer], previous Revlimid prescription from 02/14/21 discontinued in their system for transplant preparation; however, patient has decided against transplant. New prescription sent for Revlimid to [facility] by [employer] per Dr. Delton Coombes verbal order.

## 2021-02-28 NOTE — Progress Notes (Signed)
Patient presents today for treatment. Blood pressure elevated on arrival. Patient denies any headaches, blurred vision, or dizziness. MAR reviewed and updated. Patient has no complaints of side effects from treatment. Patient states he took his Amlodipine 10 mg PO at 0800 am. Patient states his blood pressure is always elevated on arrival. Blood pressure reported to Dr. Delton Coombes / Eda Keys Rn through secure chat.   Blood pressure recheck 192/80.   Labs within parameters for treatment today. Vital signs/ blood pressure out of parameters. Message sent to Dr. Delton Coombes.   Message received from Dr. Delton Coombes / Eda Keys RN to give Clonidine 0.2 mg PO x 1 dose now. Per Dr. Delton Coombes give Clonidine 0.'2mg'$  po now and when blood pressure is back to patient's base line may treat at that time.   Reported blood pressure 180/70. Treatment plan released. Patient's baseline.   Treatment given today per MD orders. Tolerated without adverse affects. Vital signs stable. No complaints at this time. Discharged from clinic ambulatory in stable condition. Alert and oriented x 3. F/U with [facility] as scheduled.

## 2021-03-01 ENCOUNTER — Encounter (HOSPITAL_COMMUNITY): Payer: Self-pay

## 2021-03-01 LAB — KAPPA/LAMBDA LIGHT CHAINS
Kappa free light chain: 15.3 mg/L (ref 3.3–19.4)
Kappa, lambda light chain ratio: 1.42 (ref 0.26–1.65)
Lambda free light chains: 10.8 mg/L (ref 5.7–26.3)

## 2021-03-01 NOTE — Progress Notes (Signed)
LATE ENTRY:  I met with the patient 02/28/2021 during injection appointment. Discussed plan of care with the patient and support system. Patient states "I won't say that I gave up, but that's what I did." I ask the patient to elaborate on what he means and if he feels that he has a full understanding of his disease state and treatment plan. Patient then states "they do everything for me, my medication, my money, everything, I just don't care." Patient does explicitly give permission for [facility] to speak with Tammy and Sabino Dick, but does not engage in further discussion about his treatment plan. I attempted to discuss patient's appointment with [facility], but he does not engage in further conversation. I encourage the patient to reach out to the clinic with questions or concerns.

## 2021-03-02 LAB — PROTEIN ELECTROPHORESIS, SERUM
A/G Ratio: 1.2 (ref 0.7–1.7)
Albumin ELP: 2.9 g/dL (ref 2.9–4.4)
Alpha-1-Globulin: 0.3 g/dL (ref 0.0–0.4)
Alpha-2-Globulin: 0.9 g/dL (ref 0.4–1.0)
Beta Globulin: 0.8 g/dL (ref 0.7–1.3)
Gamma Globulin: 0.4 g/dL (ref 0.4–1.8)
Globulin, Total: 2.4 g/dL (ref 2.2–3.9)
M-Spike, %: 0.1 g/dL — ABNORMAL HIGH
Total Protein ELP: 5.3 g/dL — ABNORMAL LOW (ref 6.0–8.5)

## 2021-03-06 NOTE — Progress Notes (Signed)
[place], [place] [address]   CLINIC:  Medical Oncology/Hematology  PCP:  Carlena Hurl, PA-C [address] / [place] [address] [phone]   REASON FOR VISIT:  Follow-up for multiple myeloma  PRIOR THERAPY: none  NGS Results: not done  CURRENT THERAPY: DaraBorD every 3 weeks; Revlimid 15 mg 2/3 weeks raBorD every 3 weeks; Revlimid 15 mg 2/3 weeks  BRIEF ONCOLOGIC HISTORY:  Oncology History  Multiple myeloma not having achieved remission ([place])  11/01/2020 Initial Diagnosis   Multiple myeloma not having achieved remission ([place])   11/02/2020 - 11/02/2020 Chemotherapy          11/21/2020 -  Chemotherapy    Patient is on Treatment Plan: MYELOMA NEWLY DIAGNOSED TRANSPLANT CANDIDATE DARAVRD (DARATUMUMAB SQ) Q21D X 6 CYCLES (INDUCTION/CONSOLIDATION)         CANCER STAGING: Cancer Staging No matching staging information was found for the patient.  INTERVAL HISTORY:  [name], a 65 y.o. male, returns for routine follow-up and consideration for next cycle of chemotherapy. Paul Norton was last seen on 02/14/21.  Due for cycle #6 of DaraVRd today.   Overall, he tells me he has been feeling fair. He reports anxiety and elevated BP while at the clinic, but reports that this does not occur at home. He denies tingling/numbness, n/v/d, headaches, and palpitations. He stopped taking acyclovir due to abdominal cramping and loss of appetite.   Overall, he feels ready for next cycle of chemo today.   REVIEW OF SYSTEMS:  Review of Systems  Constitutional:  Negative for appetite change (75%) and fatigue (75%).  Cardiovascular:  Negative for palpitations.  Gastrointestinal:  Negative for diarrhea, nausea and vomiting.  Neurological:  Negative for headaches and numbness.  All other systems reviewed and are negative.  PAST MEDICAL/SURGICAL HISTORY:  Past Medical History:  Diagnosis Date   Broken leg    Colonoscopy  refused 09/2017   Hyperlipidemia    Hypertension    Multiple myeloma ([place])    Pneumonia    Refuses treatment 09/2017   refuses screenings such as cancer screening, refuses vaccines, refuses normal preventative care   Vaccine refused by patient    all vaccines as of 09/2017   Past Surgical History:  Procedure Laterality Date   COLONOSCOPY     never, declines as of 09/2017    SOCIAL HISTORY:  Social History   Socioeconomic History   Marital status: Divorced    Spouse name: Not on file   Number of children: 1   Years of education: Not on file   Highest education level: Not on file  Occupational History   Occupation: Disability  Tobacco Use   Smoking status: Never   Smokeless tobacco: Never  Substance and Sexual Activity   Alcohol use: No   Drug use: Never   Sexual activity: Not Currently  Other Topics Concern   Not on file  Social History Narrative   Not on file   Social Determinants of Health   Financial Resource Strain: Medium Risk   Difficulty of Paying Living Expenses: Somewhat hard  Food Insecurity: No Food Insecurity   Worried About Charity fundraiser in the Last Year: Never true   [place] in the Last Year: Never true  Transportation Needs: No Transportation Needs   Lack of Transportation (Medical): No   Lack of Transportation (Non-Medical): No  Physical Activity: Insufficiently Active   Days of Exercise per Week: 5 days  Minutes of Exercise per Session: 20 min  Stress: No Stress Concern Present   Feeling of Stress : Not at all  Social Connections: Socially Isolated   Frequency of Communication with Friends and Family: More than three times a week   Frequency of Social Gatherings with Friends and Family: More than three times a week   Attends Religious Services: Never   Marine scientist or Organizations: No   Attends Music therapist: Never   Marital Status: Divorced  Human resources officer Violence: Not At Risk   Fear of Current  or Ex-Partner: No   Emotionally Abused: No   Physically Abused: No   Sexually Abused: No    FAMILY HISTORY:  No family history on file.  CURRENT MEDICATIONS:  Current Outpatient Medications  Medication Sig Dispense Refill   acetaminophen (TYLENOL) 325 MG tablet Take 2 tablets (650 mg total) by mouth every 6 (six) hours as needed for mild pain (or Fever >/= 101). (Patient not taking: Reported on 03/07/2021) 30 tablet 30   acyclovir (ZOVIRAX) 400 MG tablet Take 1 tablet (400 mg total) by mouth 2 (two) times daily. 60 tablet 6   amLODipine (NORVASC) 10 MG tablet Take 1 tablet (10 mg total) by mouth daily. 30 tablet 3   aspirin 81 MG chewable tablet Chew by mouth daily.     calcium-vitamin D (OSCAL WITH D) 500-200 MG-UNIT tablet Take 1 tablet by mouth 2 (two) times daily. 60 tablet 0   feeding supplement (ENSURE ENLIVE / ENSURE PLUS) LIQD Take 237 mLs by mouth 3 (three) times daily between meals. 237 mL 12   lenalidomide (REVLIMID) 15 MG capsule Take 1 capsule (15 mg total) by mouth daily. 14 days on, 7 days off 14 capsule 0   No current facility-administered medications for this visit.    ALLERGIES:  No Known Allergies  PHYSICAL EXAM:  Performance status (ECOG): 1 - Symptomatic but completely ambulatory  Vitals:   03/07/21 0929  Pulse: 88  Resp: 18  Temp: (!) 97 F (36.1 C)  SpO2: 99%   Wt Readings from Last 3 Encounters:  03/07/21 181 lb 6.4 oz (82.3 kg)  02/28/21 178 lb 12.8 oz (81.1 kg)  02/21/21 181 lb (82.1 kg)   Physical Exam Vitals reviewed.  Constitutional:      Appearance: Normal appearance.  Cardiovascular:     Rate and Rhythm: Normal rate and regular rhythm.     Pulses: Normal pulses.     Heart sounds: Normal heart sounds.  Pulmonary:     Effort: Pulmonary effort is normal.     Breath sounds: Normal breath sounds.  Neurological:     General: No focal deficit present.     Mental Status: He is alert and oriented to person, place, and time.  Psychiatric:         Mood and Affect: Mood normal.        Behavior: Behavior normal.    LABORATORY DATA:  I have reviewed the labs as listed.  CBC Latest Ref Rng & Units 03/07/2021 02/28/2021 02/21/2021  WBC 4.0 - 10.5 K/uL 8.2 7.2 8.6  Hemoglobin 13.0 - 17.0 g/dL 12.0(L) 11.3(L) 11.8(L)  Hematocrit 39.0 - 52.0 % 37.4(L) 35.8(L) 36.7(L)  Platelets 150 - 400 K/uL 223 172 148(L)   CMP Latest Ref Rng & Units 03/07/2021 02/28/2021 02/21/2021  Glucose 70 - 99 mg/dL 166(H) 130(H) 145(H)  BUN 8 - 23 mg/dL 26(H) 20 29(H)  Creatinine 0.61 - 1.24 mg/dL 0.99 0.96 1.15  Sodium 135 - 145 mmol/L 139 138 138  Potassium 3.5 - 5.1 mmol/L 4.1 4.1 3.7  Chloride 98 - 111 mmol/L 103 103 103  CO2 22 - 32 mmol/L _0 Calcium 8.9 - 10.3 mg/dL 7.9(L) 7.4(L) 8.1(L)  Total Protein 6.5 - 8.1 g/dL 6.2(L) 6.1(L) 6.0(L)  Total Bilirubin 0.3 - 1.2 mg/dL 0.7 0.8 0.9  Alkaline Phos 38 - 126 U/L 84 72 113  AST 15 - 41 U/L _1 ALT 0 - 44 U/L _2 DIAGNOSTIC IMAGING:  I have independently reviewed the scans and discussed with the patient. No results found.   ASSESSMENT:  1.  IgG kappa light chain multiple myeloma: - Admission with hypercalcemia and renal failure. - CT CAP showed extensive lytic foci throughout the axial and appendicular skeleton of the chest, abdomen and pelvis.  Splenomegaly with an enlarged lobular spleen, nonspecific. - Bone marrow biopsy on 11/01/2020 shows 85% atypical plasma cells in the aspirate. - FISH panel positive for 1 p-,-13,14q-,16q-,17p-/-17 and 20q- - Cytogenetics-no metaphases available for analysis. - SPEP with 5.4 g of M spike.  Free light chain ratio 1030.  LDH normal.  Beta-2 microglobulin 18.7.  24-hour urine total protein 3.6 g. - Dara VRD started on 11/21/2020.  Revlimid 15 mg 2 weeks on 1 week off started on 11/22/2020.   2.  Social/family history: - He worked as a Administrator.  He lives by himself. - Mother with breast and ovarian cancer.  Father had metastatic kidney cancer.   His brother's daughter has CML.   PLAN:  1.  IgG kappa light chain multiple myeloma, high risk: - He has completed 5 cycles of Dara RVD. - We reviewed myeloma panel from 02/28/2021 which showed M spike 0.1 g.  Free light chain ratio improved to 1.42. - He has gotten excellent response with induction therapy. - He is not interested in pursuing transplant option.  He reportedly went to the [facility] of [facility] but was not able to go to the appointment due to anxiety. - We will proceed with 1 more cycle of Dara RVD and will start him on Revlimid and Darzalex maintenance at that time given his high risk disease. - He is agreeable to the plan.  He is very anxious today in the office.  His blood pressure was very high.  He reports that his blood pressure usually goes up when he comes to our office.  He reports that he checks it at home and it is usually around 354 systolic. - I plan to repeat his myeloma labs in 2 weeks and see him back in 3 weeks to initiate maintenance therapy.   2.  Myeloma bone disease: - Continue denosumab monthly.  He is tolerating well. - Continue calcium and vitamin D supplements.  3.  ID prophylaxis: - Continue acyclovir twice daily. - Continue aspirin 81 mg daily for thromboprophylaxis.   Orders placed this encounter:  No orders of the defined types were placed in this encounter.    Derek Jack, MD [facility] [phone]   I, Thana Ates, am acting as a scribe for Dr. Derek Jack.  I, Derek Jack MD, have reviewed the above documentation for accuracy and completeness, and I agree with the above.

## 2021-03-07 ENCOUNTER — Encounter (HOSPITAL_COMMUNITY): Payer: Self-pay

## 2021-03-07 ENCOUNTER — Inpatient Hospital Stay (HOSPITAL_BASED_OUTPATIENT_CLINIC_OR_DEPARTMENT_OTHER): Payer: Medicaid Other | Admitting: Hematology

## 2021-03-07 ENCOUNTER — Other Ambulatory Visit: Payer: Self-pay

## 2021-03-07 ENCOUNTER — Inpatient Hospital Stay (HOSPITAL_COMMUNITY): Payer: Medicaid Other

## 2021-03-07 VITALS — BP 210/80 | HR 88 | Temp 97.0°F | Resp 18 | Wt 181.4 lb

## 2021-03-07 DIAGNOSIS — C9000 Multiple myeloma not having achieved remission: Secondary | ICD-10-CM

## 2021-03-07 DIAGNOSIS — Z5112 Encounter for antineoplastic immunotherapy: Secondary | ICD-10-CM | POA: Diagnosis not present

## 2021-03-07 LAB — MAGNESIUM: Magnesium: 1.8 mg/dL (ref 1.7–2.4)

## 2021-03-07 LAB — CBC WITH DIFFERENTIAL/PLATELET
Abs Immature Granulocytes: 0.05 10*3/uL (ref 0.00–0.07)
Basophils Absolute: 0.1 10*3/uL (ref 0.0–0.1)
Basophils Relative: 1 %
Eosinophils Absolute: 0.4 10*3/uL (ref 0.0–0.5)
Eosinophils Relative: 4 %
HCT: 37.4 % — ABNORMAL LOW (ref 39.0–52.0)
Hemoglobin: 12 g/dL — ABNORMAL LOW (ref 13.0–17.0)
Immature Granulocytes: 1 %
Lymphocytes Relative: 13 %
Lymphs Abs: 1.1 10*3/uL (ref 0.7–4.0)
MCH: 27.9 pg (ref 26.0–34.0)
MCHC: 32.1 g/dL (ref 30.0–36.0)
MCV: 87 fL (ref 80.0–100.0)
Monocytes Absolute: 1.3 10*3/uL — ABNORMAL HIGH (ref 0.1–1.0)
Monocytes Relative: 15 %
Neutro Abs: 5.4 10*3/uL (ref 1.7–7.7)
Neutrophils Relative %: 66 %
Platelets: 223 10*3/uL (ref 150–400)
RBC: 4.3 MIL/uL (ref 4.22–5.81)
RDW: 15.9 % — ABNORMAL HIGH (ref 11.5–15.5)
WBC: 8.2 10*3/uL (ref 4.0–10.5)
nRBC: 0 % (ref 0.0–0.2)

## 2021-03-07 LAB — COMPREHENSIVE METABOLIC PANEL
ALT: 15 U/L (ref 0–44)
AST: 16 U/L (ref 15–41)
Albumin: 3.5 g/dL (ref 3.5–5.0)
Alkaline Phosphatase: 84 U/L (ref 38–126)
Anion gap: 9 (ref 5–15)
BUN: 26 mg/dL — ABNORMAL HIGH (ref 8–23)
CO2: 27 mmol/L (ref 22–32)
Calcium: 7.9 mg/dL — ABNORMAL LOW (ref 8.9–10.3)
Chloride: 103 mmol/L (ref 98–111)
Creatinine, Ser: 0.99 mg/dL (ref 0.61–1.24)
GFR, Estimated: >60 mL/min (ref >60)
Glucose, Bld: 166 mg/dL — ABNORMAL HIGH (ref 70–99)
Potassium: 4.1 mmol/L (ref 3.5–5.1)
Sodium: 139 mmol/L (ref 135–145)
Total Bilirubin: 0.7 mg/dL (ref 0.3–1.2)
Total Protein: 6.2 g/dL — ABNORMAL LOW (ref 6.5–8.1)

## 2021-03-07 MED ORDER — BORTEZOMIB CHEMO SQ INJECTION 3.5 MG (2.5MG/ML)
1.3000 mg/m2 | Freq: Once | INTRAMUSCULAR | Status: AC
  Administered 2021-03-07: 2.5 mg via SUBCUTANEOUS
  Filled 2021-03-07: qty 1

## 2021-03-07 MED ORDER — DARATUMUMAB-HYALURONIDASE-FIHJ 1800-30000 MG-UT/15ML ~~LOC~~ SOLN
1800.0000 mg | Freq: Once | SUBCUTANEOUS | Status: AC
  Administered 2021-03-07: 1800 mg via SUBCUTANEOUS
  Filled 2021-03-07: qty 15

## 2021-03-07 MED ORDER — DIPHENHYDRAMINE HCL 25 MG PO CAPS
50.0000 mg | ORAL_CAPSULE | Freq: Once | ORAL | Status: AC
  Administered 2021-03-07: 50 mg via ORAL
  Filled 2021-03-07: qty 2

## 2021-03-07 MED ORDER — DEXAMETHASONE 4 MG PO TABS
40.0000 mg | ORAL_TABLET | Freq: Once | ORAL | Status: AC
  Administered 2021-03-07: 40 mg via ORAL
  Filled 2021-03-07: qty 10

## 2021-03-07 MED ORDER — ACETAMINOPHEN 325 MG PO TABS
650.0000 mg | ORAL_TABLET | Freq: Once | ORAL | Status: AC
  Administered 2021-03-07: 650 mg via ORAL
  Filled 2021-03-07: qty 2

## 2021-03-07 MED ORDER — DENOSUMAB 120 MG/1.7ML ~~LOC~~ SOLN
120.0000 mg | Freq: Once | SUBCUTANEOUS | Status: AC
  Administered 2021-03-07: 120 mg via SUBCUTANEOUS
  Filled 2021-03-07: qty 1.7

## 2021-03-07 NOTE — Patient Instructions (Addendum)
[facility] Discharge Instructions  You were seen today by Dr. Katragadda. He went over your recent results, and you received your treatment. Dr. Katragadda will see you back in 3 weeks for labs and follow up.   Thank you for choosing [facility] at [facility] to provide your oncology and hematology care.  To afford each patient quality time with our provider, please arrive at least 15 minutes before your scheduled appointment time.   If you have a lab appointment with the Cancer Center please come in thru the Main Entrance and check in at the main information desk  You need to re-schedule your appointment should you arrive 10 or more minutes late.  We strive to give you quality time with our providers, and arriving late affects you and other patients whose appointments are after yours.  Also, if you no show three or more times for appointments you may be dismissed from the clinic at the providers discretion.     Again, thank you for choosing [facility].  Our hope is that these requests will decrease the amount of time that you wait before being seen by our physicians.       _____________________________________________________________  Should you have questions after your visit to  Cancer Center, please contact our office at [phone] between the hours of 8:00 a.m. and 4:30 p.m.  Voicemails left after 4:00 p.m. will not be returned until the following business day.  For prescription refill requests, have your pharmacy contact our office and allow 72 hours.    Cancer Center Support Programs:   > Cancer Support Group  2nd Tuesday of the month 1pm-2pm, Journey Room   

## 2021-03-07 NOTE — Progress Notes (Signed)
Patient presents today for treatment and follow up visit with Dr. Delton Coombes. Labs within parameters for treatment. Blood pressure on arrival elevated. 210/80. Per nursing staff.   Message received form Dr. Delton Coombes / Eda Keys RN proceed with treatment today and okay to treat per MD orders with current vital signs. No more repeat blood pressures today per Dr. Delton Coombes / Eda Keys RN due to the patient is anxious per instant message secure chat.   Patient states he is taking his Revlimid as prescribed with no side effects.   Treatment given today per MD orders. Tolerated without adverse affects. Vital signs stable. No complaints at this time. Discharged from clinic ambulatory in stable condition. Alert and oriented x 3. F/U with [facility] as scheduled.

## 2021-03-07 NOTE — Patient Instructions (Signed)
[facility]  Discharge Instructions: Thank you for choosing [facility] to provide your oncology and hematology care.  If you have a lab appointment with the [facility], please come in thru the Main Entrance and check in at the main information desk.  Wear comfortable clothing and clothing appropriate for easy access to any Portacath or PICC line.   We strive to give you quality time with your provider. You may need to reschedule your appointment if you arrive late (15 or more minutes).  Arriving late affects you and other patients whose appointments are after yours.  Also, if you miss three or more appointments without notifying the office, you may be dismissed from the clinic at the provider's discretion.      For prescription refill requests, have your pharmacy contact our office and allow 72 hours for refills to be completed.    Today you received the following chemotherapy and/or immunotherapy agents Velcade/ Darzalex Faspro [place]      To help prevent nausea and vomiting after your treatment, we encourage you to take your nausea medication as directed.  BELOW ARE SYMPTOMS THAT SHOULD BE REPORTED IMMEDIATELY: *FEVER GREATER THAN 100.4 F (38 C) OR HIGHER *CHILLS OR SWEATING *NAUSEA AND VOMITING THAT IS NOT CONTROLLED WITH YOUR NAUSEA MEDICATION *UNUSUAL SHORTNESS OF BREATH *UNUSUAL BRUISING OR BLEEDING *URINARY PROBLEMS (pain or burning when urinating, or frequent urination) *BOWEL PROBLEMS (unusual diarrhea, constipation, pain near the anus) TENDERNESS IN MOUTH AND THROAT WITH OR WITHOUT PRESENCE OF ULCERS (sore throat, sores in mouth, or a toothache) UNUSUAL RASH, SWELLING OR PAIN  UNUSUAL VAGINAL DISCHARGE OR ITCHING   Items with * indicate a potential emergency and should be followed up as soon as possible or go to the Emergency Department if any problems should occur.  Please show the CHEMOTHERAPY ALERT CARD or IMMUNOTHERAPY ALERT CARD at check-in to  the Emergency Department and triage nurse.  Should you have questions after your visit or need to cancel or reschedule your appointment, please contact [facility] [place] [phone]  and follow the prompts.  Office hours are 8:00 a.m. to 4:30 p.m. Monday - Friday. Please note that voicemails left after 4:00 p.m. may not be returned until the following business day.  We are closed weekends and major holidays. You have access to a nurse at all times for urgent questions. Please call the main number to the clinic [phone] and follow the prompts.  For any non-urgent questions, you may also contact your provider using MyChart. We now offer e-Visits for anyone 60 and older to request care online for non-urgent symptoms. For details visit [email or web address].   Also download the MyChart app! Go to the app store, search "MyChart", open the app, select [facility], and log in with your MyChart username and password.  Due to Covid, a mask is required upon entering the hospital/clinic. If you do not have a mask, one will be given to you upon arrival. For doctor visits, patients may have 1 support person aged 57 or older with them. For treatment visits, patients cannot have anyone with them due to current Covid guidelines and our immunocompromised population.

## 2021-03-07 NOTE — Progress Notes (Signed)
Patient has been assessed, vital signs and labs have been reviewed by Dr. Delton Coombes. ANC, Creatinine, LFTs, and Platelets are within treatment parameters per Dr. Delton Coombes. The patient is good to proceed with treatment at this time.  Dr Delton Coombes aware of BP today. Primary RN and pharmacy aware.

## 2021-03-08 ENCOUNTER — Encounter (HOSPITAL_COMMUNITY): Payer: Self-pay

## 2021-03-09 ENCOUNTER — Inpatient Hospital Stay: Payer: Commercial Managed Care - PPO

## 2021-03-09 ENCOUNTER — Inpatient Hospital Stay: Payer: Commercial Managed Care - PPO | Admitting: Hematology and Oncology

## 2021-03-14 ENCOUNTER — Inpatient Hospital Stay (HOSPITAL_COMMUNITY): Payer: Medicaid Other

## 2021-03-14 ENCOUNTER — Other Ambulatory Visit: Payer: Self-pay

## 2021-03-14 ENCOUNTER — Encounter (HOSPITAL_COMMUNITY): Payer: Self-pay

## 2021-03-14 VITALS — BP 167/70 | HR 80 | Temp 97.6°F | Resp 18

## 2021-03-14 DIAGNOSIS — Z5112 Encounter for antineoplastic immunotherapy: Secondary | ICD-10-CM | POA: Diagnosis not present

## 2021-03-14 DIAGNOSIS — C9000 Multiple myeloma not having achieved remission: Secondary | ICD-10-CM

## 2021-03-14 LAB — CBC WITH DIFFERENTIAL/PLATELET
Abs Immature Granulocytes: 0.07 10*3/uL (ref 0.00–0.07)
Basophils Absolute: 0.1 10*3/uL (ref 0.0–0.1)
Basophils Relative: 1 %
Eosinophils Absolute: 0.5 10*3/uL (ref 0.0–0.5)
Eosinophils Relative: 6 %
HCT: 38 % — ABNORMAL LOW (ref 39.0–52.0)
Hemoglobin: 12 g/dL — ABNORMAL LOW (ref 13.0–17.0)
Immature Granulocytes: 1 %
Lymphocytes Relative: 11 %
Lymphs Abs: 1 10*3/uL (ref 0.7–4.0)
MCH: 27.5 pg (ref 26.0–34.0)
MCHC: 31.6 g/dL (ref 30.0–36.0)
MCV: 87.2 fL (ref 80.0–100.0)
Monocytes Absolute: 1 10*3/uL (ref 0.1–1.0)
Monocytes Relative: 11 %
Neutro Abs: 6.4 10*3/uL (ref 1.7–7.7)
Neutrophils Relative %: 70 %
Platelets: 214 10*3/uL (ref 150–400)
RBC: 4.36 MIL/uL (ref 4.22–5.81)
RDW: 16.1 % — ABNORMAL HIGH (ref 11.5–15.5)
WBC: 9 10*3/uL (ref 4.0–10.5)
nRBC: 0 % (ref 0.0–0.2)

## 2021-03-14 LAB — COMPREHENSIVE METABOLIC PANEL
ALT: 15 U/L (ref 0–44)
AST: 17 U/L (ref 15–41)
Albumin: 3.7 g/dL (ref 3.5–5.0)
Alkaline Phosphatase: 76 U/L (ref 38–126)
Anion gap: 11 (ref 5–15)
BUN: 23 mg/dL (ref 8–23)
CO2: 27 mmol/L (ref 22–32)
Calcium: 8 mg/dL — ABNORMAL LOW (ref 8.9–10.3)
Chloride: 99 mmol/L (ref 98–111)
Creatinine, Ser: 1.1 mg/dL (ref 0.61–1.24)
GFR, Estimated: >60 mL/min (ref >60)
Glucose, Bld: 149 mg/dL — ABNORMAL HIGH (ref 70–99)
Potassium: 3.8 mmol/L (ref 3.5–5.1)
Sodium: 137 mmol/L (ref 135–145)
Total Bilirubin: 0.9 mg/dL (ref 0.3–1.2)
Total Protein: 6 g/dL — ABNORMAL LOW (ref 6.5–8.1)

## 2021-03-14 LAB — MAGNESIUM: Magnesium: 1.8 mg/dL (ref 1.7–2.4)

## 2021-03-14 MED ORDER — BORTEZOMIB CHEMO SQ INJECTION 3.5 MG (2.5MG/ML)
1.3000 mg/m2 | Freq: Once | INTRAMUSCULAR | Status: AC
  Administered 2021-03-14: 2.5 mg via SUBCUTANEOUS
  Filled 2021-03-14: qty 1

## 2021-03-14 MED ORDER — PROCHLORPERAZINE MALEATE 10 MG PO TABS
10.0000 mg | ORAL_TABLET | Freq: Once | ORAL | Status: AC
  Administered 2021-03-14: 10 mg via ORAL
  Filled 2021-03-14: qty 1

## 2021-03-14 NOTE — Patient Instructions (Signed)
[facility]  Discharge Instructions: Thank you for choosing [facility] to provide your oncology and hematology care.  If you have a lab appointment with the [facility], please come in thru the Main Entrance and check in at the main information desk.  Wear comfortable clothing and clothing appropriate for easy access to any Portacath or PICC line.   We strive to give you quality time with your provider. You may need to reschedule your appointment if you arrive late (15 or more minutes).  Arriving late affects you and other patients whose appointments are after yours.  Also, if you miss three or more appointments without notifying the office, you may be dismissed from the clinic at the provider's discretion.      For prescription refill requests, have your pharmacy contact our office and allow 72 hours for refills to be completed.    Today you received the following chemotherapy and/or immunotherapy agents Velcade.      To help prevent nausea and vomiting after your treatment, we encourage you to take your nausea medication as directed.  BELOW ARE SYMPTOMS THAT SHOULD BE REPORTED IMMEDIATELY: *FEVER GREATER THAN 100.4 F (38 C) OR HIGHER *CHILLS OR SWEATING *NAUSEA AND VOMITING THAT IS NOT CONTROLLED WITH YOUR NAUSEA MEDICATION *UNUSUAL SHORTNESS OF BREATH *UNUSUAL BRUISING OR BLEEDING *URINARY PROBLEMS (pain or burning when urinating, or frequent urination) *BOWEL PROBLEMS (unusual diarrhea, constipation, pain near the anus) TENDERNESS IN MOUTH AND THROAT WITH OR WITHOUT PRESENCE OF ULCERS (sore throat, sores in mouth, or a toothache) UNUSUAL RASH, SWELLING OR PAIN  UNUSUAL VAGINAL DISCHARGE OR ITCHING   Items with * indicate a potential emergency and should be followed up as soon as possible or go to the Emergency Department if any problems should occur.  Please show the CHEMOTHERAPY ALERT CARD or IMMUNOTHERAPY ALERT CARD at check-in to the Emergency  Department and triage nurse.  Should you have questions after your visit or need to cancel or reschedule your appointment, please contact [facility] [phone]  and follow the prompts.  Office hours are 8:00 a.m. to 4:30 p.m. Monday - Friday. Please note that voicemails left after 4:00 p.m. may not be returned until the following business day.  We are closed weekends and major holidays. You have access to a nurse at all times for urgent questions. Please call the main number to the clinic [phone] and follow the prompts.  For any non-urgent questions, you may also contact your provider using MyChart. We now offer e-Visits for anyone 64 and older to request care online for non-urgent symptoms. For details visit [email or web address].   Also download the MyChart app! Go to the app store, search "MyChart", open the app, select [facility], and log in with your MyChart username and password.  Due to Covid, a mask is required upon entering the hospital/clinic. If you do not have a mask, one will be given to you upon arrival. For doctor visits, patients may have 1 support person aged 64 or older with them. For treatment visits, patients cannot have anyone with them due to current Covid guidelines and our immunocompromised population.

## 2021-03-14 NOTE — Progress Notes (Signed)
Patient presents today for treatment . Vital signs within parameters for today's treatment. Calcium 8.0 today. Labs are within parameters for treatment. Patient states he is taking Revlimid as prescribed with no apparent issues or side effects. Patient denies any changes since his last treatment. Blood pressure on arrival 167/70.  Patient states he is taking calcium and vitamin D tablets twice a day.   Treatment given today per MD orders. Tolerated without adverse affects. Vital signs stable. No complaints at this time. Discharged from clinic ambulatory in stable condition. Alert and oriented x 3. F/U with [facility] as scheduled.

## 2021-03-21 ENCOUNTER — Other Ambulatory Visit (HOSPITAL_COMMUNITY): Payer: Self-pay

## 2021-03-21 ENCOUNTER — Inpatient Hospital Stay (HOSPITAL_COMMUNITY): Payer: Medicaid Other

## 2021-03-21 ENCOUNTER — Inpatient Hospital Stay (HOSPITAL_COMMUNITY): Payer: Medicaid Other | Attending: Hematology

## 2021-03-21 ENCOUNTER — Encounter (HOSPITAL_COMMUNITY): Payer: Self-pay

## 2021-03-21 ENCOUNTER — Other Ambulatory Visit: Payer: Self-pay

## 2021-03-21 VITALS — BP 174/65 | HR 79 | Temp 97.9°F | Resp 18 | Wt 182.0 lb

## 2021-03-21 DIAGNOSIS — C9000 Multiple myeloma not having achieved remission: Secondary | ICD-10-CM | POA: Insufficient documentation

## 2021-03-21 DIAGNOSIS — Z79899 Other long term (current) drug therapy: Secondary | ICD-10-CM | POA: Diagnosis not present

## 2021-03-21 DIAGNOSIS — Z5112 Encounter for antineoplastic immunotherapy: Secondary | ICD-10-CM | POA: Diagnosis present

## 2021-03-21 LAB — CBC WITH DIFFERENTIAL/PLATELET
Abs Immature Granulocytes: 0.03 10*3/uL (ref 0.00–0.07)
Basophils Absolute: 0.1 10*3/uL (ref 0.0–0.1)
Basophils Relative: 1 %
Eosinophils Absolute: 0.2 10*3/uL (ref 0.0–0.5)
Eosinophils Relative: 2 %
HCT: 37.9 % — ABNORMAL LOW (ref 39.0–52.0)
Hemoglobin: 11.9 g/dL — ABNORMAL LOW (ref 13.0–17.0)
Immature Granulocytes: 0 %
Lymphocytes Relative: 10 %
Lymphs Abs: 0.9 10*3/uL (ref 0.7–4.0)
MCH: 26.7 pg (ref 26.0–34.0)
MCHC: 31.4 g/dL (ref 30.0–36.0)
MCV: 85 fL (ref 80.0–100.0)
Monocytes Absolute: 1.3 10*3/uL — ABNORMAL HIGH (ref 0.1–1.0)
Monocytes Relative: 14 %
Neutro Abs: 6.6 10*3/uL (ref 1.7–7.7)
Neutrophils Relative %: 73 %
Platelets: 194 10*3/uL (ref 150–400)
RBC: 4.46 MIL/uL (ref 4.22–5.81)
RDW: 15.9 % — ABNORMAL HIGH (ref 11.5–15.5)
WBC: 9.1 10*3/uL (ref 4.0–10.5)
nRBC: 0 % (ref 0.0–0.2)

## 2021-03-21 LAB — COMPREHENSIVE METABOLIC PANEL
ALT: 14 U/L (ref 0–44)
AST: 17 U/L (ref 15–41)
Albumin: 3.6 g/dL (ref 3.5–5.0)
Alkaline Phosphatase: 71 U/L (ref 38–126)
Anion gap: 9 (ref 5–15)
BUN: 25 mg/dL — ABNORMAL HIGH (ref 8–23)
CO2: 28 mmol/L (ref 22–32)
Calcium: 8.3 mg/dL — ABNORMAL LOW (ref 8.9–10.3)
Chloride: 102 mmol/L (ref 98–111)
Creatinine, Ser: 1.07 mg/dL (ref 0.61–1.24)
GFR, Estimated: >60 mL/min (ref >60)
Glucose, Bld: 149 mg/dL — ABNORMAL HIGH (ref 70–99)
Potassium: 3.7 mmol/L (ref 3.5–5.1)
Sodium: 139 mmol/L (ref 135–145)
Total Bilirubin: 0.8 mg/dL (ref 0.3–1.2)
Total Protein: 6.3 g/dL — ABNORMAL LOW (ref 6.5–8.1)

## 2021-03-21 LAB — MAGNESIUM: Magnesium: 1.8 mg/dL (ref 1.7–2.4)

## 2021-03-21 LAB — LACTATE DEHYDROGENASE: LDH: 125 U/L (ref 98–192)

## 2021-03-21 MED ORDER — DEXAMETHASONE 4 MG PO TABS
40.0000 mg | ORAL_TABLET | Freq: Once | ORAL | Status: AC
  Administered 2021-03-21: 40 mg via ORAL
  Filled 2021-03-21: qty 10

## 2021-03-21 MED ORDER — DIPHENHYDRAMINE HCL 25 MG PO CAPS
50.0000 mg | ORAL_CAPSULE | Freq: Once | ORAL | Status: AC
  Administered 2021-03-21: 50 mg via ORAL
  Filled 2021-03-21: qty 2

## 2021-03-21 MED ORDER — BORTEZOMIB CHEMO SQ INJECTION 3.5 MG (2.5MG/ML)
1.3000 mg/m2 | Freq: Once | INTRAMUSCULAR | Status: AC
  Administered 2021-03-21: 2.5 mg via SUBCUTANEOUS
  Filled 2021-03-21: qty 1

## 2021-03-21 MED ORDER — DARATUMUMAB-HYALURONIDASE-FIHJ 1800-30000 MG-UT/15ML ~~LOC~~ SOLN
1800.0000 mg | Freq: Once | SUBCUTANEOUS | Status: AC
  Administered 2021-03-21: 1800 mg via SUBCUTANEOUS
  Filled 2021-03-21: qty 15

## 2021-03-21 MED ORDER — ACETAMINOPHEN 325 MG PO TABS
650.0000 mg | ORAL_TABLET | Freq: Once | ORAL | Status: AC
  Administered 2021-03-21: 650 mg via ORAL
  Filled 2021-03-21: qty 2

## 2021-03-21 MED ORDER — LENALIDOMIDE 15 MG PO CAPS: 15.0000 mg | ORAL_CAPSULE | Freq: Every day | ORAL | 0 refills | Status: DC

## 2021-03-21 NOTE — Telephone Encounter (Signed)
Chart reviewed. Revlimid refilled per last office note with Dr. Katragadda.  

## 2021-03-21 NOTE — Patient Instructions (Signed)
[facility]  Discharge Instructions: Thank you for choosing [facility] to provide your oncology and hematology care.  If you have a lab appointment with the [facility], please come in thru the Main Entrance and check in at the main information desk.  Wear comfortable clothing and clothing appropriate for easy access to any Portacath or PICC line.   We strive to give you quality time with your provider. You may need to reschedule your appointment if you arrive late (15 or more minutes).  Arriving late affects you and other patients whose appointments are after yours.  Also, if you miss three or more appointments without notifying the office, you may be dismissed from the clinic at the provider's discretion.      For prescription refill requests, have your pharmacy contact our office and allow 72 hours for refills to be completed.    Today you received the following chemotherapy and/or immunotherapy agents dara [place]rot[place]es and velcade      To help prevent nausea and vomiting after your treatment, we encourage you to take your nausea medication as directed.  BELOW ARE SYMPTOMS THAT SHOULD BE REPORTED IMMEDIATELY: *FEVER GREATER THAN 100.4 F (38 C) OR HIGHER *CHILLS OR SWEATING *NAUSEA AND VOMITING THAT IS NOT CONTROLLED WITH YOUR NAUSEA MEDICATION *UNUSUAL SHORTNESS OF BREATH *UNUSUAL BRUISING OR BLEEDING *URINARY PROBLEMS (pain or burning when urinating, or frequent urination) *BOWEL PROBLEMS (unusual diarrhea, constipation, pain near the anus) TENDERNESS IN MOUTH AND THROAT WITH OR WITHOUT PRESENCE OF ULCERS (sore throat, sores in mouth, or a toothache) UNUSUAL RASH, SWELLING OR PAIN  UNUSUAL VAGINAL DISCHARGE OR ITCHING   Items with * indicate a potential emergency and should be followed up as soon as possible or go to the Emergency Department if any problems should occur.  Please show the CHEMOTHERAPY ALERT CARD or IMMUNOTHERAPY ALERT CARD at check-in to the  Emergency Department and triage nurse.  Should you have questions after your visit or need to cancel or reschedule your appointment, please contact [facility] [phone]  and follow the prompts.  Office hours are 8:00 a.m. to 4:30 p.m. Monday - Friday. Please note that voicemails left after 4:00 p.m. may not be returned until the following business day.  We are closed weekends and major holidays. You have access to a nurse at all times for urgent questions. Please call the main number to the clinic [phone] and follow the prompts.  For any non-urgent questions, you may also contact your provider using MyChart. We now offer e-Visits for anyone 64 and older to request care online for non-urgent symptoms. For details visit [email or web address].   Also download the MyChart app! Go to the app store, search "MyChart", open the app, select [facility], and log in with your MyChart username and password.  Due to Covid, a mask is required upon entering the hospital/clinic. If you do not have a mask, one will be given to you upon arrival. For doctor visits, patients may have 1 support person aged 64 or older with them. For treatment visits, patients cannot have anyone with them due to current Covid guidelines and our immunocompromised population.   Daratumumab; Hyaluronidase Injection What is this medication? DARATUMUMAB; HYALURONIDASE (dar a toom ue mab / hye al ur ON i dase) is a monoclonal antibody. Hyaluronidase is used to improve the effects of daratumumab. It treats certain types of cancer. Some of the cancers treated are multiple myeloma and light-chain amyloidosis. This medicine may be  used for other purposes; ask your health care provider or pharmacist if you have questions. COMMON BRAND NAME(S): DARZALEX FASPRO What should I tell my care team before I take this medication? They need to know if you have any of these conditions: heart disease infection especially a viral  infection such as chickenpox, cold sores, herpes, or hepatitis B lung or breathing disease an unusual or allergic reaction to daratumumab, hyaluronidase, other medicines, foods, dyes, or preservatives pregnant or trying to get pregnant breast-feeding How should I use this medication? This medicine is for injection under the skin. It is given by a health care professional in a hospital or clinic setting. Talk to your pediatrician regarding the use of this medicine in children. Special care may be needed. Overdosage: If you think you have taken too much of this medicine contact a poison control center or emergency room at once. NOTE: This medicine is only for you. Do not share this medicine with others. What if I miss a dose? Keep appointments for follow-up doses as directed. It is important not to miss your dose. Call your doctor or health care professional if you are unable to keep an appointment. What may interact with this medication? Interactions have not been studied. This list may not describe all possible interactions. Give your health care provider a list of all the medicines, herbs, non-prescription drugs, or dietary supplements you use. Also tell them if you smoke, drink alcohol, or use illegal drugs. Some items may interact with your medicine. What should I watch for while using this medication? Your condition will be monitored carefully while you are receiving this medicine. This medicine can cause serious allergic reactions. To reduce your risk, your health care provider may give you other medicine to take before receiving this one. Be sure to follow the directions from your health care provider. This medicine can affect the results of blood tests to match your blood type. These changes can last for up to 6 months after the final dose. Your healthcare provider will do blood tests to match your blood type before you start treatment. Tell all of your healthcare providers that you are  being treated with this medicine before receiving a blood transfusion. This medicine can affect the results of some tests used to determine treatment response; extra tests may be needed to evaluate response. Do not become pregnant while taking this medicine or for 3 months after stopping it. Women should inform their health care provider if they wish to become pregnant or think they might be pregnant. There is a potential for serious side effects to an unborn child. Talk to your health care provider for more information. Do not breast-feed an infant while taking this medicine. What side effects may I notice from receiving this medication? Side effects that you should report to your doctor or health care professional as soon as possible: allergic reactions like skin rash, itching or hives, swelling of the face, lips, or tongue blurred vision breathing problems chest pain cough dizziness fast heartbeat feeling faint or lightheaded headache low blood counts - this medicine may decrease the number of white blood cells, red blood cells and platelets. You may be at increased risk for infections and bleeding. nausea, vomiting shortness of breath signs of decreased platelets or bleeding - bruising, pinpoint red spots on the skin, black, tarry stools, blood in the urine signs of decreased red blood cells - unusually weak or tired, feeling faint or lightheaded, falls signs of infection - fever or  chills, cough, sore throat, pain or difficulty passing urine Side effects that usually do not require medical attention (report these to your doctor or health care professional if they continue or are bothersome): back pain constipation diarrhea pain, tingling, numbness in the hands or feet muscle cramps swelling of the ankles, feet, hands tiredness trouble sleeping This list may not describe all possible side effects. Call your doctor for medical advice about side effects. You may report side effects to  FDA at 1-800-FDA-1088. Where should I keep my medication? This drug is given in a hospital or clinic and will not be stored at home. NOTE: This sheet is a summary. It may not cover all possible information. If you have questions about this medicine, talk to your doctor, pharmacist, or health care provider.  2022 Elsevier/Gold Standard (2020-08-11 12:51:54)  Bortezomib injection What is this medication? BORTEZOMIB (bor TEZ oh mib) targets proteins in cancer cells and stops the cancer cells from growing. It treats multiple myeloma and mantle cell lymphoma. This medicine may be used for other purposes; ask your health care provider or pharmacist if you have questions. COMMON BRAND NAME(S): Velcade What should I tell my care team before I take this medication? They need to know if you have any of these conditions: dehydration diabetes (high blood sugar) heart disease liver disease tingling of the fingers or toes or other nerve disorder an unusual or allergic reaction to bortezomib, mannitol, boron, other medicines, foods, dyes, or preservatives pregnant or trying to get pregnant breast-feeding How should I use this medication? This medicine is injected into a vein or under the skin. It is given by a health care provider in a hospital or clinic setting. Talk to your health care provider about the use of this medicine in children. Special care may be needed. Overdosage: If you think you have taken too much of this medicine contact a poison control center or emergency room at once. NOTE: This medicine is only for you. Do not share this medicine with others. What if I miss a dose? Keep appointments for follow-up doses. It is important not to miss your dose. Call your health care provider if you are unable to keep an appointment. What may interact with this medication? This medicine may interact with the following medications: ketoconazole rifampin This list may not describe all possible  interactions. Give your health care provider a list of all the medicines, herbs, non-prescription drugs, or dietary supplements you use. Also tell them if you smoke, drink alcohol, or use illegal drugs. Some items may interact with your medicine. What should I watch for while using this medication? Your condition will be monitored carefully while you are receiving this medicine. You may need blood work done while you are taking this medicine. You may get drowsy or dizzy. Do not drive, use machinery, or do anything that needs mental alertness until you know how this medicine affects you. Do not stand up or sit up quickly, especially if you are an older patient. This reduces the risk of dizzy or fainting spells This medicine may increase your risk of getting an infection. Call your health care provider for advice if you get a fever, chills, sore throat, or other symptoms of a cold or flu. Do not treat yourself. Try to avoid being around people who are sick. Check with your health care provider if you have severe diarrhea, nausea, and vomiting, or if you sweat a lot. The loss of too much body fluid may make it  dangerous for you to take this medicine. Do not become pregnant while taking this medicine or for 7 months after stopping it. Women should inform their health care provider if they wish to become pregnant or think they might be pregnant. Men should not father a child while taking this medicine and for 4 months after stopping it. There is a potential for serious harm to an unborn child. Talk to your health care provider for more information. Do not breast-feed an infant while taking this medicine or for 2 months after stopping it. This medicine may make it more difficult to get pregnant or father a child. Talk to your health care provider if you are concerned about your fertility. What side effects may I notice from receiving this medication? Side effects that you should report to your doctor or health  care professional as soon as possible: allergic reactions (skin rash; itching or hives; swelling of the face, lips, or tongue) bleeding (bloody or black, tarry stools; red or dark brown urine; spitting up blood or brown material that looks like coffee grounds; red spots on the skin; unusual bruising or bleeding from the eye, gums, or nose) blurred vision or changes in vision confusion constipation headache heart failure (trouble breathing; fast, irregular heartbeat; sudden weight gain; swelling of the ankles, feet, hands) infection (fever, chills, cough, sore throat, pain or trouble passing urine) lack or loss of appetite liver injury (dark yellow or brown urine; general ill feeling or flu-like symptoms; loss of appetite, right upper belly pain; yellowing of the eyes or skin) low blood pressure (dizziness; feeling faint or lightheaded, falls; unusually weak or tired) muscle cramps pain, redness, or irritation at site where injected pain, tingling, numbness in the hands or feet seizures trouble breathing unusual bruising or bleeding Side effects that usually do not require medical attention (report to your doctor or health care professional if they continue or are bothersome): diarrhea nausea stomach pain trouble sleeping vomiting This list may not describe all possible side effects. Call your doctor for medical advice about side effects. You may report side effects to FDA at 1-800-FDA-1088. Where should I keep my medication? This medicine is given in a hospital or clinic. It will not be stored at home. NOTE: This sheet is a summary. It may not cover all possible information. If you have questions about this medicine, talk to your doctor, pharmacist, or health care provider.  2022 Elsevier/Gold Standard (2020-06-23 13:22:53)

## 2021-03-21 NOTE — Progress Notes (Signed)
Pt here for daratumumab subcutaneous and velcade. Vital signs and labs WNL for treatment today.  Patient is taking revlimid as prescribed.  They have not missed any doses and report no side effects at this time.    Tolerated treatment well today without incidence.  Stable during and after treatment.  AVS reviewed.  Discharged in stable condition ambulatory.

## 2021-03-22 LAB — KAPPA/LAMBDA LIGHT CHAINS
Kappa free light chain: 15.4 mg/L (ref 3.3–19.4)
Kappa, lambda light chain ratio: 1.56 (ref 0.26–1.65)
Lambda free light chains: 9.9 mg/L (ref 5.7–26.3)

## 2021-03-23 LAB — PROTEIN ELECTROPHORESIS, SERUM
A/G Ratio: 1.3 (ref 0.7–1.7)
Albumin ELP: 3.1 g/dL (ref 2.9–4.4)
Alpha-1-Globulin: 0.3 g/dL (ref 0.0–0.4)
Alpha-2-Globulin: 0.9 g/dL (ref 0.4–1.0)
Beta Globulin: 0.8 g/dL (ref 0.7–1.3)
Gamma Globulin: 0.3 g/dL — ABNORMAL LOW (ref 0.4–1.8)
Globulin, Total: 2.4 g/dL (ref 2.2–3.9)
M-Spike, %: 0.1 g/dL — ABNORMAL HIGH
Total Protein ELP: 5.5 g/dL — ABNORMAL LOW (ref 6.0–8.5)

## 2021-03-25 NOTE — Progress Notes (Signed)
[place], [place] [address]   CLINIC:  Medical Oncology/Hematology  PCP:  Carlena Hurl, PA-C [address] / [place] [address] [phone]   REASON FOR VISIT:  Follow-up for multiple myeloma  PRIOR THERAPY: none  NGS Results: not done  CURRENT THERAPY: DaraBorD every 3 weeks; Revlimid 10 mg 3/2 weeks raBorD every 3 weeks; Revlimid 10 mg 3/2 weeks  BRIEF ONCOLOGIC HISTORY:  Oncology History  Multiple myeloma not having achieved remission ([place])  11/01/2020 Initial Diagnosis   Multiple myeloma not having achieved remission ([place])   11/02/2020 - 11/02/2020 Chemotherapy          11/21/2020 -  Chemotherapy    Patient is on Treatment Plan: MYELOMA NEWLY DIAGNOSED TRANSPLANT CANDIDATE DARAVRD (DARATUMUMAB SQ) Q21D X 6 CYCLES (INDUCTION/CONSOLIDATION)         CANCER STAGING: Cancer Staging No matching staging information was found for the patient.  INTERVAL HISTORY:  [name], a 65 y.o. male, returns for routine follow-up of his multiple myeloma. Paul Norton was last seen on 03/27/2021.   Today he reports feeling good. He denis tingling and numbness in the hands and feet.  REVIEW OF SYSTEMS:  Review of Systems  Constitutional:  Negative for appetite change and fatigue.  Neurological:  Negative for numbness.  All other systems reviewed and are negative.  PAST MEDICAL/SURGICAL HISTORY:  Past Medical History:  Diagnosis Date   Broken leg    Colonoscopy refused 09/2017   Hyperlipidemia    Hypertension    Multiple myeloma ([place])    Pneumonia    Refuses treatment 09/2017   refuses screenings such as cancer screening, refuses vaccines, refuses normal preventative care   Vaccine refused by patient    all vaccines as of 09/2017   Past Surgical History:  Procedure Laterality Date   COLONOSCOPY     never, declines as of 09/2017    SOCIAL HISTORY:  Social History   Socioeconomic History   Marital  status: Divorced    Spouse name: Not on file   Number of children: 1   Years of education: Not on file   Highest education level: Not on file  Occupational History   Occupation: Disability  Tobacco Use   Smoking status: Never   Smokeless tobacco: Never  Substance and Sexual Activity   Alcohol use: No   Drug use: Never   Sexual activity: Not Currently  Other Topics Concern   Not on file  Social History Narrative   Not on file   Social Determinants of Health   Financial Resource Strain: Medium Risk   Difficulty of Paying Living Expenses: Somewhat hard  Food Insecurity: No Food Insecurity   Worried About Charity fundraiser in the Last Year: Never true   [place] in the Last Year: Never true  Transportation Needs: No Transportation Needs   Lack of Transportation (Medical): No   Lack of Transportation (Non-Medical): No  Physical Activity: Insufficiently Active   Days of Exercise per Week: 5 days   Minutes of Exercise per Session: 20 min  Stress: No Stress Concern Present   Feeling of Stress : Not at all  Social Connections: Socially Isolated   Frequency of Communication with Friends and Family: More than three times a week   Frequency of Social Gatherings with Friends and Family: More than three times a week   Attends Religious Services: Never   Marine scientist or Organizations: No  Attends Archivist Meetings: Never   Marital Status: Divorced  Human resources officer Violence: Not At Risk   Fear of Current or Ex-Partner: No   Emotionally Abused: No   Physically Abused: No   Sexually Abused: No    FAMILY HISTORY:  History reviewed. No pertinent family history.  CURRENT MEDICATIONS:  Current Outpatient Medications  Medication Sig Dispense Refill   acetaminophen (TYLENOL) 325 MG tablet Take 2 tablets (650 mg total) by mouth every 6 (six) hours as needed for mild pain (or Fever >/= 101). 30 tablet 30   acyclovir (ZOVIRAX) 400 MG tablet Take 1 tablet  (400 mg total) by mouth 2 (two) times daily. 60 tablet 6   amLODipine (NORVASC) 10 MG tablet Take 1 tablet (10 mg total) by mouth daily. 30 tablet 3   aspirin 81 MG chewable tablet Chew by mouth daily.     feeding supplement (ENSURE ENLIVE / ENSURE PLUS) LIQD Take 237 mLs by mouth 3 (three) times daily between meals. 237 mL 12   lenalidomide (REVLIMID) 15 MG capsule Take 1 capsule (15 mg total) by mouth daily. 14 days on, 7 days off 14 capsule 0   calcium-vitamin D (OSCAL WITH D) 500-200 MG-UNIT tablet Take 1 tablet by mouth 2 (two) times daily. 60 tablet 0   No current facility-administered medications for this visit.    ALLERGIES:  No Known Allergies  PHYSICAL EXAM:  Performance status (ECOG): 1 - Symptomatic but completely ambulatory  Vitals:   03/27/21 1416  BP: (!) 157/79  Pulse: 90  Resp: 18  Temp: (!) 97.2 F (36.2 C)  SpO2: 99%   Wt Readings from Last 3 Encounters:  03/27/21 181 lb 3.2 oz (82.2 kg)  03/21/21 182 lb (82.6 kg)  03/07/21 181 lb 6.4 oz (82.3 kg)   Physical Exam Vitals reviewed.  Constitutional:      Appearance: Normal appearance.  Cardiovascular:     Rate and Rhythm: Normal rate and regular rhythm.     Pulses: Normal pulses.     Heart sounds: Normal heart sounds.  Pulmonary:     Effort: Pulmonary effort is normal.     Breath sounds: Normal breath sounds.  Neurological:     General: No focal deficit present.     Mental Status: He is alert and oriented to person, place, and time.  Psychiatric:        Mood and Affect: Mood normal.        Behavior: Behavior normal.     LABORATORY DATA:  I have reviewed the labs as listed.  CBC Latest Ref Rng & Units 03/21/2021 03/14/2021 03/07/2021  WBC 4.0 - 10.5 K/uL 9.1 9.0 8.2  Hemoglobin 13.0 - 17.0 g/dL 11.9(L) 12.0(L) 12.0(L)  Hematocrit 39.0 - 52.0 % 37.9(L) 38.0(L) 37.4(L)  Platelets 150 - 400 K/uL 194 214 223   CMP Latest Ref Rng & Units 03/21/2021 03/14/2021 03/07/2021  Glucose 70 - 99 mg/dL 149(H) 149(H)  166(H)  BUN 8 - 23 mg/dL 25(H) 23 26(H)  Creatinine 0.61 - 1.24 mg/dL 1.07 1.10 0.99  Sodium 135 - 145 mmol/L 139 137 139  Potassium 3.5 - 5.1 mmol/L 3.7 3.8 4.1  Chloride 98 - 111 mmol/L 102 99 103  CO2 22 - 32 mmol/L _0 Calcium 8.9 - 10.3 mg/dL 8.3(L) 8.0(L) 7.9(L)  Total Protein 6.5 - 8.1 g/dL 6.3(L) 6.0(L) 6.2(L)  Total Bilirubin 0.3 - 1.2 mg/dL 0.8 0.9 0.7  Alkaline Phos 38 - 126 U/L 71 76 84  AST 15 - 41 U/L _0 ALT 0 - 44 U/L _1 DIAGNOSTIC IMAGING:  I have independently reviewed the scans and discussed with the patient. No results found.   ASSESSMENT:  1.  IgG kappa light chain multiple myeloma: - Admission with hypercalcemia and renal failure. - CT CAP showed extensive lytic foci throughout the axial and appendicular skeleton of the chest, abdomen and pelvis.  Splenomegaly with an enlarged lobular spleen, nonspecific. - Bone marrow biopsy on 11/01/2020 shows 85% atypical plasma cells in the aspirate. - FISH panel positive for 1 p-,-13,14q-,16q-,17p-/-17 and 20q- - Cytogenetics-no metaphases available for analysis. - SPEP with 5.4 g of M spike.  Free light chain ratio 1030.  LDH normal.  Beta-2 microglobulin 18.7.  24-hour urine total protein 3.6 g. - 6 cycles of Dara VRD from 11/21/2020 through 03/07/2021. - Patient declined bone marrow transplant. - Maintenance Revlimid and monthly daratumumab started on 03/27/2021.  2.  Social/family history: - He worked as a Administrator.  He lives by himself. - Mother with breast and ovarian cancer.  Father had metastatic kidney cancer.  His brother's daughter has CML.   PLAN:  1.  IgG kappa light chain multiple myeloma, high risk: - He has completed 6 cycles of Dara VRD.  He did not have any significant side effects. - Reviewed myeloma labs from 03/21/2021.  M spike is 0.1 g.  Free light chain ratio is 1.56 and kappa light chains are 15.4. - Reviewed routine labs which shows creatinine 1.07 and normal calcium.  LFTs  are normal. - Based on very good response, I have recommended change in treatment at this time. - We discussed maintenance Revlimid 10 mg 3 weeks on 1 week off.  He will also receive maintenance daratumumab once every 4 weeks. - He currently has Revlimid 15 mg which she will complete. - RTC 4 weeks for follow-up and treatment.   2.  Myeloma bone disease: - Continue denosumab monthly.  He is tolerating it very well. - Continue calcium and vitamin D supplements.  3.  ID prophylaxis: - Continue acyclovir twice daily. - Continue aspirin 81 mg daily for thromboprophylaxis.   Orders placed this encounter:  Orders Placed This Encounter  Procedures   Immunofixation electrophoresis     Derek Jack, MD [place] [phone]   I, Thana Ates, am acting as a scribe for Dr. Derek Jack.  I, Derek Jack MD, have reviewed the above documentation for accuracy and completeness, and I agree with the above.

## 2021-03-27 ENCOUNTER — Encounter (HOSPITAL_COMMUNITY): Payer: Self-pay | Admitting: Hematology

## 2021-03-27 ENCOUNTER — Other Ambulatory Visit: Payer: Self-pay

## 2021-03-27 ENCOUNTER — Inpatient Hospital Stay (HOSPITAL_BASED_OUTPATIENT_CLINIC_OR_DEPARTMENT_OTHER): Payer: Medicaid Other | Admitting: Hematology

## 2021-03-27 ENCOUNTER — Encounter (HOSPITAL_COMMUNITY): Payer: Self-pay

## 2021-03-27 VITALS — BP 157/79 | HR 90 | Temp 97.2°F | Resp 18 | Wt 181.2 lb

## 2021-03-27 DIAGNOSIS — C9000 Multiple myeloma not having achieved remission: Secondary | ICD-10-CM | POA: Diagnosis not present

## 2021-03-27 DIAGNOSIS — Z5112 Encounter for antineoplastic immunotherapy: Secondary | ICD-10-CM | POA: Diagnosis not present

## 2021-03-27 NOTE — Patient Instructions (Addendum)
[place] at [facility] Discharge Instructions  You were seen today by Dr. Delton Coombes. Dr. Delton Coombes reviewed your recent lab results.  Please continue taking Acyclovir and Aspirin.  Dr. Delton Coombes has recommended transitioning to maintenance therapy for your Multiple Myeloma. This will mean taking Darzalex injections once every 4 weeks and Revlimid 8m for 3 weeks on, 1 week off. Complete your current bottle of Revlimid and take 7 days off prior to changing dosage.   [facility].  To afford each patient quality time with our provider, please arrive at least 15 minutes before your scheduled appointment time.   If you have a [facility] appointment with the [facility] thru the Main Entrance and check in at the main information desk.  You need to re-schedule your appointment should you arrive 10 or more minutes late.  We strive to give you quality time with our providers, and arriving late affects you and other patients whose appointments are after yours.  Also, if you no show three or more times for appointments you may be dismissed from the clinic at the providers discretion.     Again, [facility]  Our hope is that these requests will decrease the amount of time that you wait before being seen by our physicians.       _____________________________________________________________  Should you have questions [facility] please contact our office at ([phone] follow the prompts.  Our office hours are 8:00 a.m. and 4:30 p.m. Monday - Friday.  Please note that voicemails left after 4:00 p.m. may not be returned until the following business day.  We are closed weekends and major holidays.  You do have access to a nurse 24-7, just call the main number to the clinic [phone] do  not press any options, hold on the line and a nurse will answer the phone.    For prescription refill requests, have your pharmacy contact our office and allow 72 hours.    Due to Covid, you will need to wear a mask upon entering the hospital. If you do not have a mask, a mask will be given to you at the Main Entrance upon arrival. For doctor visits, patients may have 1 support person age 532or older with them. For treatment visits, patients can not have anyone with them due to social distancing guidelines and our immunocompromised population.

## 2021-03-30 ENCOUNTER — Telehealth (HOSPITAL_COMMUNITY): Payer: Self-pay | Admitting: Dietician

## 2021-03-30 ENCOUNTER — Encounter (HOSPITAL_COMMUNITY): Payer: Commercial Managed Care - PPO | Admitting: Dietician

## 2021-03-30 NOTE — Telephone Encounter (Signed)
Nutrition Follow-up:  Patient receiving DaraBorD for myeloma.   Spoke with sister via telephone. She reports patient has a great appetite and eats "really well" at every meal and drinks 2 Boost everyday. This morning he ate a bowl of cereal with whole milk and a bowl of pinto beans for breakfast. He drank a Boost for snack and had a pimento cheese sandwich for lunch. He ate roast, potatoes, carrots for dinner. Sister reports he was eating a lot of ice cream, she does not think he has been eating as much lately. Sister reports he is "wide open" and walks 2-3 miles a day. She reports he "slows down" for a few days after infusions. She denies nutrition impact symptoms.    Medications: reviewed  Labs: 9/6 - Glucose 149  Anthropometrics: Last weight 181 lb 3.2 oz on 9/12 stable  8/23 - 181 lb 6.4 oz 8/16 - 178 lb 12.8 oz 8/2 - 174 lb 14.4 oz 7/5 - 172 lb 9.6 oz   NUTRITION DIAGNOSIS: Inadequate oral intake stable  INTERVENTION:  Continue eating high calorie, high protein foods for weight maintenance Recommended drinking Boost supplement as needed with decreased appetite/poor intake Encouraged activity as able Patient has contact information   MONITORING, EVALUATION, GOAL: weight trends, intake   NEXT VISIT: No follow-up scheduled at this time

## 2021-04-04 ENCOUNTER — Other Ambulatory Visit (HOSPITAL_COMMUNITY): Payer: Commercial Managed Care - PPO

## 2021-04-04 ENCOUNTER — Ambulatory Visit (HOSPITAL_COMMUNITY): Payer: Commercial Managed Care - PPO

## 2021-04-06 ENCOUNTER — Other Ambulatory Visit (HOSPITAL_COMMUNITY): Payer: Self-pay

## 2021-04-06 MED ORDER — LENALIDOMIDE 10 MG PO CAPS: 10.0000 mg | ORAL_CAPSULE | Freq: Every day | ORAL | 0 refills | Status: DC

## 2021-04-06 NOTE — Telephone Encounter (Signed)
Chart reviewed. Revlimid 10mg  daily for 21 days on, 7 days off prescription sent to patient's pharmacy per Dr. Delton Coombes

## 2021-04-10 ENCOUNTER — Other Ambulatory Visit (HOSPITAL_COMMUNITY): Payer: Self-pay

## 2021-04-10 MED ORDER — LENALIDOMIDE 10 MG PO CAPS: 10.0000 mg | ORAL_CAPSULE | Freq: Every day | ORAL | 0 refills | Status: DC

## 2021-04-10 NOTE — Telephone Encounter (Signed)
Revlimid prescription forwards to [facility] in [place], [place] per pharmacy request.

## 2021-04-17 ENCOUNTER — Encounter (HOSPITAL_COMMUNITY): Payer: Self-pay | Admitting: Hematology

## 2021-04-22 NOTE — Progress Notes (Signed)
[place], [place] [address]   CLINIC:  Medical Oncology/Hematology  PCP:  Carlena Hurl, PA-C [address] / [place] [address] [phone]   REASON FOR VISIT:  Follow-up for multiple myeloma  PRIOR THERAPY: none  NGS Results: not done  CURRENT THERAPY: DaraBorD every 3 weeks; Revlimid 10 mg 3/2 weeks raBorD every 3 weeks; Revlimid 10 mg 3/2 weeks  BRIEF ONCOLOGIC HISTORY:  Oncology History  Multiple myeloma not having achieved remission ([place])  11/01/2020 Initial Diagnosis   Multiple myeloma not having achieved remission ([place])   11/02/2020 - 11/02/2020 Chemotherapy          11/21/2020 -  Chemotherapy   Patient is on Treatment Plan : MYELOMA NEWLY DIAGNOSED TRANSPLANT CANDIDATE DaraVRd (Daratumumab SQ) q21d x 6 Cycles (Induction/Consolidation)       CANCER STAGING: Cancer Staging No matching staging information was found for the patient.  INTERVAL HISTORY:  [name], a 65 y.o. male, returns for routine follow-up and consideration for next cycle of chemotherapy. Paul Norton was last seen on 03/27/2021.  Due for cycle #7 of DaraVRd today.   Overall, he tells me he has been feeling pretty well. He is taking Revlimid as prescribed and tolerating it well. He reports good activity and energy levels. He denies any recent fatigue, n/v/d/c, tingling/numbness, new pains, fevers, chills, and night sweats.   Overall, he feels ready for next cycle of chemo today.   REVIEW OF SYSTEMS:  Review of Systems  Constitutional:  Negative for appetite change, chills, fatigue and fever. Unexpected weight change: +9 lbs. Gastrointestinal:  Negative for constipation, diarrhea, nausea and vomiting.  Musculoskeletal:  Negative for arthralgias and myalgias.  Neurological:  Negative for numbness.  All other systems reviewed and are negative.  PAST MEDICAL/SURGICAL HISTORY:  Past Medical History:  Diagnosis Date   Broken leg     Colonoscopy refused 09/2017   Hyperlipidemia    Hypertension    Multiple myeloma ([place])    Pneumonia    Refuses treatment 09/2017   refuses screenings such as cancer screening, refuses vaccines, refuses normal preventative care   Vaccine refused by patient    all vaccines as of 09/2017   Past Surgical History:  Procedure Laterality Date   COLONOSCOPY     never, declines as of 09/2017    SOCIAL HISTORY:  Social History   Socioeconomic History   Marital status: Divorced    Spouse name: Not on file   Number of children: 1   Years of education: Not on file   Highest education level: Not on file  Occupational History   Occupation: Disability  Tobacco Use   Smoking status: Never   Smokeless tobacco: Never  Substance and Sexual Activity   Alcohol use: No   Drug use: Never   Sexual activity: Not Currently  Other Topics Concern   Not on file  Social History Narrative   Not on file   Social Determinants of Health   Financial Resource Strain: Medium Risk   Difficulty of Paying Living Expenses: Somewhat hard  Food Insecurity: No Food Insecurity   Worried About Charity fundraiser in the Last Year: Never true   [place] in the Last Year: Never true  Transportation Needs: No Transportation Needs   Lack of Transportation (Medical): No   Lack of Transportation (Non-Medical): No  Physical Activity: Insufficiently Active   Days of Exercise per Week: 5 days   Minutes of  Exercise per Session: 20 min  Stress: No Stress Concern Present   Feeling of Stress : Not at all  Social Connections: Socially Isolated   Frequency of Communication with Friends and Family: More than three times a week   Frequency of Social Gatherings with Friends and Family: More than three times a week   Attends Religious Services: Never   Marine scientist or Organizations: No   Attends Music therapist: Never   Marital Status: Divorced  Human resources officer Violence: Not At Risk    Fear of Current or Ex-Partner: No   Emotionally Abused: No   Physically Abused: No   Sexually Abused: No    FAMILY HISTORY:  No family history on file.  CURRENT MEDICATIONS:  Current Outpatient Medications  Medication Sig Dispense Refill   acyclovir (ZOVIRAX) 400 MG tablet Take 1 tablet (400 mg total) by mouth 2 (two) times daily. 60 tablet 6   amLODipine (NORVASC) 10 MG tablet Take 1 tablet (10 mg total) by mouth daily. 30 tablet 3   aspirin 81 MG chewable tablet Chew by mouth daily.     feeding supplement (ENSURE ENLIVE / ENSURE PLUS) LIQD Take 237 mLs by mouth 3 (three) times daily between meals. 237 mL 12   lenalidomide (REVLIMID) 10 MG capsule Take 1 capsule (10 mg total) by mouth daily. 21 days on, 7 days off 21 capsule 0   acetaminophen (TYLENOL) 325 MG tablet Take 2 tablets (650 mg total) by mouth every 6 (six) hours as needed for mild pain (or Fever >/= 101). (Patient not taking: Reported on 04/24/2021) 30 tablet 30   calcium-vitamin D (OSCAL WITH D) 500-200 MG-UNIT tablet Take 1 tablet by mouth 2 (two) times daily. 60 tablet 0   No current facility-administered medications for this visit.    ALLERGIES:  No Known Allergies  PHYSICAL EXAM:  Performance status (ECOG): 1 - Symptomatic but completely ambulatory  Vitals:   04/24/21 1350  BP: (!) 200/82  Pulse: 76  Temp: (!) 97 F (36.1 C)  SpO2: 98%   Wt Readings from Last 3 Encounters:  04/24/21 190 lb 4.1 oz (86.3 kg)  03/27/21 181 lb 3.2 oz (82.2 kg)  03/21/21 182 lb (82.6 kg)   Physical Exam Vitals reviewed.  Constitutional:      Appearance: Normal appearance.  Cardiovascular:     Rate and Rhythm: Normal rate and regular rhythm.     Pulses: Normal pulses.     Heart sounds: Normal heart sounds.  Pulmonary:     Effort: Pulmonary effort is normal.     Breath sounds: Normal breath sounds.  Neurological:     General: No focal deficit present.     Mental Status: He is alert and oriented to person, place, and  time.  Psychiatric:        Mood and Affect: Mood normal.        Behavior: Behavior normal.    LABORATORY DATA:  I have reviewed the labs as listed.  CBC Latest Ref Rng & Units 04/24/2021 03/21/2021 03/14/2021  WBC 4.0 - 10.5 K/uL 6.6 9.1 9.0  Hemoglobin 13.0 - 17.0 g/dL 11.6(L) 11.9(L) 12.0(L)  Hematocrit 39.0 - 52.0 % 36.4(L) 37.9(L) 38.0(L)  Platelets 150 - 400 K/uL 204 194 214   CMP Latest Ref Rng & Units 04/24/2021 03/21/2021 03/14/2021  Glucose 70 - 99 mg/dL 142(H) 149(H) 149(H)  BUN 8 - 23 mg/dL 28(H) 25(H) 23  Creatinine 0.61 - 1.24 mg/dL 1.03 1.07 1.10  Sodium  135 - 145 mmol/L 138 139 137  Potassium 3.5 - 5.1 mmol/L 3.9 3.7 3.8  Chloride 98 - 111 mmol/L 102 102 99  CO2 22 - 32 mmol/L $RemoveB'28 28 27  'hNimwQBf$ Calcium 8.9 - 10.3 mg/dL 8.4(L) 8.3(L) 8.0(L)  Total Protein 6.5 - 8.1 g/dL 6.5 6.3(L) 6.0(L)  Total Bilirubin 0.3 - 1.2 mg/dL 1.3(H) 0.8 0.9  Alkaline Phos 38 - 126 U/L 70 71 76  AST 15 - 41 U/L $Remo'17 17 17  'BGWSE$ ALT 0 - 44 U/L $Remo'15 14 15    'ryRJR$ DIAGNOSTIC IMAGING:  I have independently reviewed the scans and discussed with the patient. No results found.   ASSESSMENT:  1.  IgG kappa light chain multiple myeloma: - Admission with hypercalcemia and renal failure. - CT CAP showed extensive lytic foci throughout the axial and appendicular skeleton of the chest, abdomen and pelvis.  Splenomegaly with an enlarged lobular spleen, nonspecific. - Bone marrow biopsy on 11/01/2020 shows 85% atypical plasma cells in the aspirate. - FISH panel positive for 1 p-,-13,14q-,16q-,17p-/-17 and 20q- - Cytogenetics-no metaphases available for analysis. - SPEP with 5.4 g of M spike.  Free light chain ratio 1030.  LDH normal.  Beta-2 microglobulin 18.7.  24-hour urine total protein 3.6 g. - 6 cycles of Dara VRD from 11/21/2020 through 03/07/2021. - Patient declined bone marrow transplant. - Maintenance Revlimid and monthly daratumumab started on 03/27/2021.   2.  Social/family history: - He worked as a Administrator.   He lives by himself. - Mother with breast and ovarian cancer.  Father had metastatic kidney cancer.  His brother's daughter has CML.   PLAN:  1.  IgG kappa light chain multiple myeloma, high risk: - We reviewed myeloma labs from 03/21/2021.  M spike is stable at 0.1 g.  Free light chain ratio is 1.56 with kappa light chains normal at 15.4. - Reviewed labs from today which showed normal CBC with mild normocytic anemia.  Creatinine is 1.03 and calcium 8.4.  Total bilirubin is mildly elevated at 1.3. - He will continue Revlimid 10 mg 21 days on/7 days off. - He will continue daratumumab every 4 weeks. - We will follow-up on myeloma panel from today.  RTC 4 weeks for follow-up.   2.  Myeloma bone disease: - Continue calcium and vitamin D supplements. - Calcium today is 8.4.  Continue monthly denosumab.  3.  ID prophylaxis: - Continue acyclovir twice daily. - Continue aspirin 81 mg daily for thromboprophylaxis.   Orders placed this encounter:  No orders of the defined types were placed in this encounter.    Derek Jack, MD [place] [phone]   I, Thana Ates, am acting as a scribe for Dr. Derek Jack.  I, Derek Jack MD, have reviewed the above documentation for accuracy and completeness, and I agree with the above.

## 2021-04-24 ENCOUNTER — Other Ambulatory Visit: Payer: Self-pay

## 2021-04-24 ENCOUNTER — Inpatient Hospital Stay (HOSPITAL_BASED_OUTPATIENT_CLINIC_OR_DEPARTMENT_OTHER): Payer: Medicaid Other | Admitting: Hematology

## 2021-04-24 ENCOUNTER — Inpatient Hospital Stay (HOSPITAL_COMMUNITY): Payer: Medicaid Other | Attending: Hematology

## 2021-04-24 ENCOUNTER — Encounter (HOSPITAL_COMMUNITY): Payer: Self-pay

## 2021-04-24 ENCOUNTER — Inpatient Hospital Stay (HOSPITAL_COMMUNITY): Payer: Medicaid Other

## 2021-04-24 VITALS — BP 200/82 | HR 76 | Temp 97.0°F | Wt 190.3 lb

## 2021-04-24 DIAGNOSIS — C9000 Multiple myeloma not having achieved remission: Secondary | ICD-10-CM

## 2021-04-24 DIAGNOSIS — Z5112 Encounter for antineoplastic immunotherapy: Secondary | ICD-10-CM | POA: Insufficient documentation

## 2021-04-24 DIAGNOSIS — Z79899 Other long term (current) drug therapy: Secondary | ICD-10-CM | POA: Diagnosis not present

## 2021-04-24 LAB — CBC WITH DIFFERENTIAL/PLATELET
Abs Immature Granulocytes: 0.03 10*3/uL (ref 0.00–0.07)
Basophils Absolute: 0.1 10*3/uL (ref 0.0–0.1)
Basophils Relative: 1 %
Eosinophils Absolute: 0.3 10*3/uL (ref 0.0–0.5)
Eosinophils Relative: 4 %
HCT: 36.4 % — ABNORMAL LOW (ref 39.0–52.0)
Hemoglobin: 11.6 g/dL — ABNORMAL LOW (ref 13.0–17.0)
Immature Granulocytes: 1 %
Lymphocytes Relative: 15 %
Lymphs Abs: 1 10*3/uL (ref 0.7–4.0)
MCH: 26.8 pg (ref 26.0–34.0)
MCHC: 31.9 g/dL (ref 30.0–36.0)
MCV: 84.1 fL (ref 80.0–100.0)
Monocytes Absolute: 1.3 10*3/uL — ABNORMAL HIGH (ref 0.1–1.0)
Monocytes Relative: 20 %
Neutro Abs: 4 10*3/uL (ref 1.7–7.7)
Neutrophils Relative %: 59 %
Platelets: 204 10*3/uL (ref 150–400)
RBC: 4.33 MIL/uL (ref 4.22–5.81)
RDW: 17.1 % — ABNORMAL HIGH (ref 11.5–15.5)
WBC: 6.6 10*3/uL (ref 4.0–10.5)
nRBC: 0 % (ref 0.0–0.2)

## 2021-04-24 LAB — COMPREHENSIVE METABOLIC PANEL
ALT: 15 U/L (ref 0–44)
AST: 17 U/L (ref 15–41)
Albumin: 4 g/dL (ref 3.5–5.0)
Alkaline Phosphatase: 70 U/L (ref 38–126)
Anion gap: 8 (ref 5–15)
BUN: 28 mg/dL — ABNORMAL HIGH (ref 8–23)
CO2: 28 mmol/L (ref 22–32)
Calcium: 8.4 mg/dL — ABNORMAL LOW (ref 8.9–10.3)
Chloride: 102 mmol/L (ref 98–111)
Creatinine, Ser: 1.03 mg/dL (ref 0.61–1.24)
GFR, Estimated: >60 mL/min (ref >60)
Glucose, Bld: 142 mg/dL — ABNORMAL HIGH (ref 70–99)
Potassium: 3.9 mmol/L (ref 3.5–5.1)
Sodium: 138 mmol/L (ref 135–145)
Total Bilirubin: 1.3 mg/dL — ABNORMAL HIGH (ref 0.3–1.2)
Total Protein: 6.5 g/dL (ref 6.5–8.1)

## 2021-04-24 LAB — LACTATE DEHYDROGENASE: LDH: 142 U/L (ref 98–192)

## 2021-04-24 LAB — MAGNESIUM: Magnesium: 1.7 mg/dL (ref 1.7–2.4)

## 2021-04-24 MED ORDER — DENOSUMAB 120 MG/1.7ML ~~LOC~~ SOLN
120.0000 mg | Freq: Once | SUBCUTANEOUS | Status: AC
  Administered 2021-04-24: 120 mg via SUBCUTANEOUS
  Filled 2021-04-24: qty 1.7

## 2021-04-24 MED ORDER — DEXAMETHASONE 4 MG PO TABS
40.0000 mg | ORAL_TABLET | Freq: Once | ORAL | Status: AC
  Administered 2021-04-24: 40 mg via ORAL
  Filled 2021-04-24: qty 10

## 2021-04-24 MED ORDER — DARATUMUMAB-HYALURONIDASE-FIHJ 1800-30000 MG-UT/15ML ~~LOC~~ SOLN
1800.0000 mg | Freq: Once | SUBCUTANEOUS | Status: AC
  Administered 2021-04-24: 1800 mg via SUBCUTANEOUS
  Filled 2021-04-24: qty 15

## 2021-04-24 MED ORDER — DIPHENHYDRAMINE HCL 25 MG PO CAPS
50.0000 mg | ORAL_CAPSULE | Freq: Once | ORAL | Status: AC
  Administered 2021-04-24: 50 mg via ORAL
  Filled 2021-04-24: qty 2

## 2021-04-24 MED ORDER — ACETAMINOPHEN 325 MG PO TABS
650.0000 mg | ORAL_TABLET | Freq: Once | ORAL | Status: AC
  Administered 2021-04-24: 650 mg via ORAL
  Filled 2021-04-24: qty 2

## 2021-04-24 NOTE — Progress Notes (Signed)
Patient has been assessed, vital signs and labs have been reviewed by Dr. Delton Coombes. ANC, Creatinine, LFTs, and Platelets are within treatment parameters per Dr. Delton Coombes. The patient is good to proceed with treatment Xgeva and Dara at this time.  Primary RN and pharmacy aware.

## 2021-04-24 NOTE — Patient Instructions (Signed)
[facility] at [facility] Discharge Instructions  You were seen and examined today by Dr. Katragadda. Please follow up as scheduled.    Thank you for choosing [facility] at [facility] to provide your oncology and hematology care.  To afford each patient quality time with our provider, please arrive at least 15 minutes before your scheduled appointment time.   If you have a lab appointment with the Cancer Center please come in thru the Main Entrance and check in at the main information desk.  You need to re-schedule your appointment should you arrive 10 or more minutes late.  We strive to give you quality time with our providers, and arriving late affects you and other patients whose appointments are after yours.  Also, if you no show three or more times for appointments you may be dismissed from the clinic at the providers discretion.     Again, thank you for choosing [facility].  Our hope is that these requests will decrease the amount of time that you wait before being seen by our physicians.       _____________________________________________________________  Should you have questions after your visit to [facility], please contact our office at [phone] and follow the prompts.  Our office hours are 8:00 a.m. and 4:30 p.m. Monday - Friday.  Please note that voicemails left after 4:00 p.m. may not be returned until the following business day.  We are closed weekends and major holidays.  You do have access to a nurse 24-7, just call the main number to the clinic [phone] and do not press any options, hold on the line and a nurse will answer the phone.    For prescription refill requests, have your pharmacy contact our office and allow 72 hours.    Due to Covid, you will need to wear a mask upon entering the hospital. If you do not have a mask, a mask will be given to you at the Main Entrance upon arrival. For  doctor visits, patients may have 1 support person age 18 or older with them. For treatment visits, patients can not have anyone with them due to social distancing guidelines and our immunocompromised population.      

## 2021-04-24 NOTE — Patient Instructions (Signed)
[facility]  Discharge Instructions: Thank you for choosing [facility] to provide your oncology and hematology care.  If you have a lab appointment with the Cancer Center, please come in thru the Main Entrance and check in at the main information desk.  Wear comfortable clothing and clothing appropriate for easy access to any Portacath or PICC line.   We strive to give you quality time with your provider. You may need to reschedule your appointment if you arrive late (15 or more minutes).  Arriving late affects you and other patients whose appointments are after yours.  Also, if you miss three or more appointments without notifying the office, you may be dismissed from the clinic at the provider's discretion.      For prescription refill requests, have your pharmacy contact our office and allow 72 hours for refills to be completed.        To help prevent nausea and vomiting after your treatment, we encourage you to take your nausea medication as directed.  BELOW ARE SYMPTOMS THAT SHOULD BE REPORTED IMMEDIATELY: *FEVER GREATER THAN 100.4 F (38 C) OR HIGHER *CHILLS OR SWEATING *NAUSEA AND VOMITING THAT IS NOT CONTROLLED WITH YOUR NAUSEA MEDICATION *UNUSUAL SHORTNESS OF BREATH *UNUSUAL BRUISING OR BLEEDING *URINARY PROBLEMS (pain or burning when urinating, or frequent urination) *BOWEL PROBLEMS (unusual diarrhea, constipation, pain near the anus) TENDERNESS IN MOUTH AND THROAT WITH OR WITHOUT PRESENCE OF ULCERS (sore throat, sores in mouth, or a toothache) UNUSUAL RASH, SWELLING OR PAIN  UNUSUAL VAGINAL DISCHARGE OR ITCHING   Items with * indicate a potential emergency and should be followed up as soon as possible or go to the Emergency Department if any problems should occur.  Please show the CHEMOTHERAPY ALERT CARD or IMMUNOTHERAPY ALERT CARD at check-in to the Emergency Department and triage nurse.  Should you have questions after your visit or need to cancel  or reschedule your appointment, please contact [facility] [phone]  and follow the prompts.  Office hours are 8:00 a.m. to 4:30 p.m. Monday - Friday. Please note that voicemails left after 4:00 p.m. may not be returned until the following business day.  We are closed weekends and major holidays. You have access to a nurse at all times for urgent questions. Please call the main number to the clinic [phone] and follow the prompts.  For any non-urgent questions, you may also contact your provider using MyChart. We now offer e-Visits for anyone 18 and older to request care online for non-urgent symptoms. For details visit [email or web address].   Also download the MyChart app! Go to the app store, search "MyChart", open the app, select [facility], and log in with your MyChart username and password.  Due to Covid, a mask is required upon entering the hospital/clinic. If you do not have a mask, one will be given to you upon arrival. For doctor visits, patients may have 1 support person aged 18 or older with them. For treatment visits, patients cannot have anyone with them due to current Covid guidelines and our immunocompromised population.  

## 2021-04-24 NOTE — Progress Notes (Signed)
Patient presents today for chemotherapy injection and Xgeva injection.  Patient is in satisfactory condition with no new complaints voiced.  Labs reviewed by Dr. Delton Coombes during his office visit.  We will proceed with treatment per MD orders.   Patient tolerated Xgeva injection with no complaints voiced.  Site clean and dry with no bruising or swelling noted.  No complaints of pain.   Patient tolerated Dara Altheimer treatment well with no complaints voiced.  Patient left ambulatory in stable condition.  Vital signs stable at discharge.  Follow up as scheduled.

## 2021-04-25 LAB — KAPPA/LAMBDA LIGHT CHAINS
Kappa free light chain: 14.2 mg/L (ref 3.3–19.4)
Kappa, lambda light chain ratio: 2.06 — ABNORMAL HIGH (ref 0.26–1.65)
Lambda free light chains: 6.9 mg/L (ref 5.7–26.3)

## 2021-04-26 LAB — PROTEIN ELECTROPHORESIS, SERUM
A/G Ratio: 1.7 (ref 0.7–1.7)
Albumin ELP: 3.5 g/dL (ref 2.9–4.4)
Alpha-1-Globulin: 0.3 g/dL (ref 0.0–0.4)
Alpha-2-Globulin: 0.7 g/dL (ref 0.4–1.0)
Beta Globulin: 0.8 g/dL (ref 0.7–1.3)
Gamma Globulin: 0.3 g/dL — ABNORMAL LOW (ref 0.4–1.8)
Globulin, Total: 2.1 g/dL — ABNORMAL LOW (ref 2.2–3.9)
M-Spike, %: 0.1 g/dL — ABNORMAL HIGH
Total Protein ELP: 5.6 g/dL — ABNORMAL LOW (ref 6.0–8.5)

## 2021-04-26 LAB — IMMUNOFIXATION ELECTROPHORESIS
IgA: 32 mg/dL — ABNORMAL LOW (ref 61–437)
IgG (Immunoglobin G), Serum: 378 mg/dL — ABNORMAL LOW (ref 603–1613)
IgM (Immunoglobulin M), Srm: 16 mg/dL — ABNORMAL LOW (ref 20–172)
Total Protein ELP: 5.9 g/dL — ABNORMAL LOW (ref 6.0–8.5)

## 2021-05-01 ENCOUNTER — Encounter (HOSPITAL_COMMUNITY): Payer: Self-pay

## 2021-05-01 ENCOUNTER — Other Ambulatory Visit (HOSPITAL_COMMUNITY): Payer: Self-pay

## 2021-05-02 ENCOUNTER — Ambulatory Visit (HOSPITAL_COMMUNITY): Payer: Commercial Managed Care - PPO

## 2021-05-02 ENCOUNTER — Other Ambulatory Visit (HOSPITAL_COMMUNITY): Payer: Commercial Managed Care - PPO

## 2021-05-02 ENCOUNTER — Ambulatory Visit (HOSPITAL_COMMUNITY): Payer: Commercial Managed Care - PPO | Admitting: Hematology

## 2021-05-02 DIAGNOSIS — Z209 Contact with and (suspected) exposure to unspecified communicable disease: Secondary | ICD-10-CM

## 2021-05-03 ENCOUNTER — Encounter (HOSPITAL_COMMUNITY): Payer: Self-pay | Admitting: Hematology

## 2021-05-03 MED ORDER — LENALIDOMIDE 10 MG PO CAPS: 10.0000 mg | ORAL_CAPSULE | Freq: Every day | ORAL | 0 refills | Status: DC

## 2021-05-03 NOTE — Telephone Encounter (Signed)
Chart reviewed. Revlimid refilled per last office note with Dr. Katragadda.  

## 2021-05-17 ENCOUNTER — Other Ambulatory Visit (HOSPITAL_COMMUNITY): Payer: Self-pay | Admitting: Hematology

## 2021-05-20 ENCOUNTER — Encounter (HOSPITAL_COMMUNITY): Payer: Self-pay

## 2021-05-20 NOTE — Progress Notes (Signed)
[place], [place] [address]   CLINIC:  Medical Oncology/Hematology  PCP:  Carlena Hurl, PA-C [address] / [place] [address] [phone]   REASON FOR VISIT:  Follow-up for multiple myeloma  PRIOR THERAPY: none  NGS Results: not done  CURRENT THERAPY: DaraBorD every 3 weeks; Revlimid 10 mg 3/2 weeks raBorD every 3 weeks; Revlimid 10 mg 3/2 weeks  BRIEF ONCOLOGIC HISTORY:  Oncology History  Multiple myeloma not having achieved remission ([place])  11/01/2020 Initial Diagnosis   Multiple myeloma not having achieved remission ([place])   11/02/2020 - 11/02/2020 Chemotherapy          11/21/2020 -  Chemotherapy   Patient is on Treatment Plan : MYELOMA NEWLY DIAGNOSED TRANSPLANT CANDIDATE DaraVRd (Daratumumab SQ) q21d x 6 Cycles (Induction/Consolidation)       CANCER STAGING: Cancer Staging No matching staging information was found for the patient.  INTERVAL HISTORY:  [name], a 65 y.o. male, returns for routine follow-up and consideration for next cycle of chemotherapy. Paul Norton was last seen on 04/24/2021.  Due for cycle #8 of Darzalex Faspro today.   Overall, he tells me he has been feeling pretty well. He is taking Revlimid 3 weeks on and 1 week off as prescribed. He denies recent infections, fevers, n/v/d/c, tingling/numbness, and new pains. He remains active.   Overall, he feels ready for next cycle of chemo today.   REVIEW OF SYSTEMS:  Review of Systems  Constitutional:  Negative for appetite change, fatigue and fever.  Gastrointestinal:  Negative for constipation, diarrhea, nausea and vomiting.  Musculoskeletal:  Negative for arthralgias and myalgias.  Neurological:  Negative for numbness.  All other systems reviewed and are negative.  PAST MEDICAL/SURGICAL HISTORY:  Past Medical History:  Diagnosis Date   Broken leg    Colonoscopy refused 09/2017   Hyperlipidemia    Hypertension    Multiple  myeloma ([place])    Pneumonia    Refuses treatment 09/2017   refuses screenings such as cancer screening, refuses vaccines, refuses normal preventative care   Vaccine refused by patient    all vaccines as of 09/2017   Past Surgical History:  Procedure Laterality Date   COLONOSCOPY     never, declines as of 09/2017    SOCIAL HISTORY:  Social History   Socioeconomic History   Marital status: Divorced    Spouse name: Not on file   Number of children: 1   Years of education: Not on file   Highest education level: Not on file  Occupational History   Occupation: Disability  Tobacco Use   Smoking status: Never   Smokeless tobacco: Never  Substance and Sexual Activity   Alcohol use: No   Drug use: Never   Sexual activity: Not Currently  Other Topics Concern   Not on file  Social History Narrative   Not on file   Social Determinants of Health   Financial Resource Strain: Medium Risk   Difficulty of Paying Living Expenses: Somewhat hard  Food Insecurity: No Food Insecurity   Worried About Charity fundraiser in the Last Year: Never true   [place] in the Last Year: Never true  Transportation Needs: No Transportation Needs   Lack of Transportation (Medical): No   Lack of Transportation (Non-Medical): No  Physical Activity: Insufficiently Active   Days of Exercise per Week: 5 days   Minutes of Exercise per Session: 20 min  Stress: No Stress  Concern Present   Feeling of Stress : Not at all  Social Connections: Socially Isolated   Frequency of Communication with Friends and Family: More than three times a week   Frequency of Social Gatherings with Friends and Family: More than three times a week   Attends Religious Services: Never   Marine scientist or Organizations: No   Attends Music therapist: Never   Marital Status: Divorced  Human resources officer Violence: Not At Risk   Fear of Current or Ex-Partner: No   Emotionally Abused: No   Physically Abused:  No   Sexually Abused: No    FAMILY HISTORY:  No family history on file.  CURRENT MEDICATIONS:  Current Outpatient Medications  Medication Sig Dispense Refill   acyclovir (ZOVIRAX) 400 MG tablet Take 1 tablet (400 mg total) by mouth 2 (two) times daily. 60 tablet 6   amLODipine (NORVASC) 10 MG tablet TAKE ONE TABLET BY MOUTH ONCE DAILY 30 tablet 3   aspirin 81 MG chewable tablet Chew by mouth daily.     feeding supplement (ENSURE ENLIVE / ENSURE PLUS) LIQD Take 237 mLs by mouth 3 (three) times daily between meals. 237 mL 12   lenalidomide (REVLIMID) 10 MG capsule Take 1 capsule (10 mg total) by mouth daily. 21 days on, 7 days off 21 capsule 0   acetaminophen (TYLENOL) 325 MG tablet Take 2 tablets (650 mg total) by mouth every 6 (six) hours as needed for mild pain (or Fever >/= 101). (Patient not taking: Reported on 05/22/2021) 30 tablet 30   calcium-vitamin D (OSCAL WITH D) 500-200 MG-UNIT tablet Take 1 tablet by mouth 2 (two) times daily. 60 tablet 0   No current facility-administered medications for this visit.    ALLERGIES:  No Known Allergies  PHYSICAL EXAM:  Performance status (ECOG): 1 - Symptomatic but completely ambulatory  Vitals:   05/22/21 1352  Pulse: 75  Resp: 18  Temp: (!) 97.5 F (36.4 C)  SpO2: 99%   Wt Readings from Last 3 Encounters:  05/22/21 187 lb 3.2 oz (84.9 kg)  04/24/21 190 lb 4.1 oz (86.3 kg)  03/27/21 181 lb 3.2 oz (82.2 kg)   Physical Exam Vitals reviewed.  Constitutional:      Appearance: Normal appearance.  Cardiovascular:     Rate and Rhythm: Normal rate and regular rhythm.     Pulses: Normal pulses.     Heart sounds: Normal heart sounds.  Pulmonary:     Effort: Pulmonary effort is normal.     Breath sounds: Normal breath sounds.  Neurological:     General: No focal deficit present.     Mental Status: He is alert and oriented to person, place, and time.  Psychiatric:        Mood and Affect: Mood normal.        Behavior: Behavior  normal.    LABORATORY DATA:  I have reviewed the labs as listed.  CBC Latest Ref Rng & Units 05/22/2021 04/24/2021 03/21/2021  WBC 4.0 - 10.5 K/uL 7.5 6.6 9.1  Hemoglobin 13.0 - 17.0 g/dL 12.2(L) 11.6(L) 11.9(L)  Hematocrit 39.0 - 52.0 % 38.2(L) 36.4(L) 37.9(L)  Platelets 150 - 400 K/uL 199 204 194   CMP Latest Ref Rng & Units 05/22/2021 04/24/2021 03/21/2021  Glucose 70 - 99 mg/dL 150(H) 142(H) 149(H)  BUN 8 - 23 mg/dL 21 28(H) 25(H)  Creatinine 0.61 - 1.24 mg/dL 1.16 1.03 1.07  Sodium 135 - 145 mmol/L 137 138 139  Potassium 3.5 -  5.1 mmol/L 3.6 3.9 3.7  Chloride 98 - 111 mmol/L 99 102 102  CO2 22 - 32 mmol/L _0 Calcium 8.9 - 10.3 mg/dL 8.7(L) 8.4(L) 8.3(L)  Total Protein 6.5 - 8.1 g/dL 6.8 6.5 6.3(L)  Total Bilirubin 0.3 - 1.2 mg/dL 0.9 1.3(H) 0.8  Alkaline Phos 38 - 126 U/L 72 70 71  AST 15 - 41 U/L _1 ALT 0 - 44 U/L _2 DIAGNOSTIC IMAGING:  I have independently reviewed the scans and discussed with the patient. No results found.   ASSESSMENT:  1.  IgG kappa light chain multiple myeloma: - Admission with hypercalcemia and renal failure. - CT CAP showed extensive lytic foci throughout the axial and appendicular skeleton of the chest, abdomen and pelvis.  Splenomegaly with an enlarged lobular spleen, nonspecific. - Bone marrow biopsy on 11/01/2020 shows 85% atypical plasma cells in the aspirate. - FISH panel positive for 1 p-,-13,14q-,16q-,17p-/-17 and 20q- - Cytogenetics-no metaphases available for analysis. - SPEP with 5.4 g of M spike.  Free light chain ratio 1030.  LDH normal.  Beta-2 microglobulin 18.7.  24-hour urine total protein 3.6 g. - 6 cycles of Dara VRD from 11/21/2020 through 03/07/2021. - Patient declined bone marrow transplant. - Maintenance Revlimid and monthly daratumumab started on 03/27/2021.   2.  Social/family history: - He worked as a Administrator.  He lives by himself. - Mother with breast and ovarian cancer.  Father had metastatic  kidney cancer.  His brother's daughter has CML.    PLAN:  1.  IgG kappa light chain multiple myeloma, high risk: - I have reviewed myeloma labs from 04/24/2021.  M spike is stable at 0.1 g.  Immunofixation shows IgG kappa.  Free light chain ratio is 2.06.  Kappa light chains are 14.2. - Reviewed labs today which shows normal CBC.  LFTs were also normal. - Continue Revlimid 10 mg 3 weeks on/1 week off. - Continue monthly Darzalex. - RTC 8 weeks with myeloma panel done prior.   2.  Myeloma bone disease: - Calcium today is 8.7.  Continue calcium and vitamin D supplements. - Continue monthly denosumab.  No dental issues.  3.  ID prophylaxis: - Continue acyclovir twice daily. - Continue aspirin 81 mg daily for thromboprophylaxis.   Orders placed this encounter:  No orders of the defined types were placed in this encounter.    Derek Jack, MD [facility] [phone]   I, Thana Ates, am acting as a scribe for Dr. Derek Jack.  I, Derek Jack MD, have reviewed the above documentation for accuracy and completeness, and I agree with the above.

## 2021-05-22 ENCOUNTER — Inpatient Hospital Stay (HOSPITAL_BASED_OUTPATIENT_CLINIC_OR_DEPARTMENT_OTHER): Payer: Medicaid Other | Admitting: Hematology

## 2021-05-22 ENCOUNTER — Inpatient Hospital Stay (HOSPITAL_COMMUNITY): Payer: Medicaid Other

## 2021-05-22 ENCOUNTER — Inpatient Hospital Stay (HOSPITAL_COMMUNITY): Payer: Medicaid Other | Attending: Hematology

## 2021-05-22 ENCOUNTER — Other Ambulatory Visit: Payer: Self-pay

## 2021-05-22 VITALS — BP 201/81

## 2021-05-22 VITALS — HR 75 | Temp 97.5°F | Resp 18 | Wt 187.2 lb

## 2021-05-22 DIAGNOSIS — Z79899 Other long term (current) drug therapy: Secondary | ICD-10-CM | POA: Insufficient documentation

## 2021-05-22 DIAGNOSIS — C9000 Multiple myeloma not having achieved remission: Secondary | ICD-10-CM | POA: Diagnosis present

## 2021-05-22 DIAGNOSIS — Z5112 Encounter for antineoplastic immunotherapy: Secondary | ICD-10-CM | POA: Diagnosis not present

## 2021-05-22 LAB — COMPREHENSIVE METABOLIC PANEL
ALT: 15 U/L (ref 0–44)
AST: 17 U/L (ref 15–41)
Albumin: 4.1 g/dL (ref 3.5–5.0)
Alkaline Phosphatase: 72 U/L (ref 38–126)
Anion gap: 8 (ref 5–15)
BUN: 21 mg/dL (ref 8–23)
CO2: 30 mmol/L (ref 22–32)
Calcium: 8.7 mg/dL — ABNORMAL LOW (ref 8.9–10.3)
Chloride: 99 mmol/L (ref 98–111)
Creatinine, Ser: 1.16 mg/dL (ref 0.61–1.24)
GFR, Estimated: >60 mL/min (ref >60)
Glucose, Bld: 150 mg/dL — ABNORMAL HIGH (ref 70–99)
Potassium: 3.6 mmol/L (ref 3.5–5.1)
Sodium: 137 mmol/L (ref 135–145)
Total Bilirubin: 0.9 mg/dL (ref 0.3–1.2)
Total Protein: 6.8 g/dL (ref 6.5–8.1)

## 2021-05-22 LAB — CBC WITH DIFFERENTIAL/PLATELET
Abs Immature Granulocytes: 0.03 10*3/uL (ref 0.00–0.07)
Basophils Absolute: 0.1 10*3/uL (ref 0.0–0.1)
Basophils Relative: 1 %
Eosinophils Absolute: 0.2 10*3/uL (ref 0.0–0.5)
Eosinophils Relative: 2 %
HCT: 38.2 % — ABNORMAL LOW (ref 39.0–52.0)
Hemoglobin: 12.2 g/dL — ABNORMAL LOW (ref 13.0–17.0)
Immature Granulocytes: 0 %
Lymphocytes Relative: 14 %
Lymphs Abs: 1 10*3/uL (ref 0.7–4.0)
MCH: 26.8 pg (ref 26.0–34.0)
MCHC: 31.9 g/dL (ref 30.0–36.0)
MCV: 83.8 fL (ref 80.0–100.0)
Monocytes Absolute: 1.5 10*3/uL — ABNORMAL HIGH (ref 0.1–1.0)
Monocytes Relative: 20 %
Neutro Abs: 4.7 10*3/uL (ref 1.7–7.7)
Neutrophils Relative %: 63 %
Platelets: 199 10*3/uL (ref 150–400)
RBC: 4.56 MIL/uL (ref 4.22–5.81)
RDW: 17 % — ABNORMAL HIGH (ref 11.5–15.5)
WBC: 7.5 10*3/uL (ref 4.0–10.5)
nRBC: 0 % (ref 0.0–0.2)

## 2021-05-22 LAB — MAGNESIUM: Magnesium: 1.8 mg/dL (ref 1.7–2.4)

## 2021-05-22 MED ORDER — DARATUMUMAB-HYALURONIDASE-FIHJ 1800-30000 MG-UT/15ML ~~LOC~~ SOLN
1800.0000 mg | Freq: Once | SUBCUTANEOUS | Status: AC
  Administered 2021-05-22: 1800 mg via SUBCUTANEOUS
  Filled 2021-05-22: qty 15

## 2021-05-22 MED ORDER — DENOSUMAB 120 MG/1.7ML ~~LOC~~ SOLN
120.0000 mg | Freq: Once | SUBCUTANEOUS | Status: AC
  Administered 2021-05-22: 120 mg via SUBCUTANEOUS
  Filled 2021-05-22: qty 1.7

## 2021-05-22 MED ORDER — DEXAMETHASONE 4 MG PO TABS
40.0000 mg | ORAL_TABLET | Freq: Once | ORAL | Status: AC
  Administered 2021-05-22: 40 mg via ORAL
  Filled 2021-05-22: qty 10

## 2021-05-22 MED ORDER — ACETAMINOPHEN 325 MG PO TABS
650.0000 mg | ORAL_TABLET | Freq: Once | ORAL | Status: AC
  Administered 2021-05-22: 650 mg via ORAL
  Filled 2021-05-22: qty 2

## 2021-05-22 MED ORDER — DIPHENHYDRAMINE HCL 25 MG PO CAPS
50.0000 mg | ORAL_CAPSULE | Freq: Once | ORAL | Status: AC
  Administered 2021-05-22: 50 mg via ORAL
  Filled 2021-05-22: qty 2

## 2021-05-22 NOTE — Progress Notes (Signed)
Patient seen and assessed, labs reviewed by Dr. Delton Coombes. Patient to proceed with treatment today. Primary RN and Pharmacy made aware.

## 2021-05-22 NOTE — Patient Instructions (Signed)
[facility]  Discharge Instructions: Thank you for choosing [facility] to provide your oncology and hematology care.  If you have a lab appointment with the [facility], please come in thru the Main Entrance and check in at the main information desk.  Wear comfortable clothing and clothing appropriate for easy access to any Portacath or PICC line.   We strive to give you quality time with your provider. You may need to reschedule your appointment if you arrive late (15 or more minutes).  Arriving late affects you and other patients whose appointments are after yours.  Also, if you miss three or more appointments without notifying the office, you may be dismissed from the clinic at the provider's discretion.      For prescription refill requests, have your pharmacy contact our office and allow 72 hours for refills to be completed.    Today you received the following chemotherapy and/or immunotherapy agents Daratumumab, return as scheduled.   To help prevent nausea and vomiting after your treatment, we encourage you to take your nausea medication as directed.  BELOW ARE SYMPTOMS THAT SHOULD BE REPORTED IMMEDIATELY: *FEVER GREATER THAN 100.4 F (38 C) OR HIGHER *CHILLS OR SWEATING *NAUSEA AND VOMITING THAT IS NOT CONTROLLED WITH YOUR NAUSEA MEDICATION *UNUSUAL SHORTNESS OF BREATH *UNUSUAL BRUISING OR BLEEDING *URINARY PROBLEMS (pain or burning when urinating, or frequent urination) *BOWEL PROBLEMS (unusual diarrhea, constipation, pain near the anus) TENDERNESS IN MOUTH AND THROAT WITH OR WITHOUT PRESENCE OF ULCERS (sore throat, sores in mouth, or a toothache) UNUSUAL RASH, SWELLING OR PAIN  UNUSUAL VAGINAL DISCHARGE OR ITCHING   Items with * indicate a potential emergency and should be followed up as soon as possible or go to the Emergency Department if any problems should occur.  Please show the CHEMOTHERAPY ALERT CARD or IMMUNOTHERAPY ALERT CARD at check-in to  the Emergency Department and triage nurse.  Should you have questions after your visit or need to cancel or reschedule your appointment, please contact [facility] [phone]  and follow the prompts.  Office hours are 8:00 a.m. to 4:30 p.m. Monday - Friday. Please note that voicemails left after 4:00 p.m. may not be returned until the following business day.  We are closed weekends and major holidays. You have access to a nurse at all times for urgent questions. Please call the main number to the clinic [phone] and follow the prompts.  For any non-urgent questions, you may also contact your provider using MyChart. We now offer e-Visits for anyone 59 and older to request care online for non-urgent symptoms. For details visit [email or web address].   Also download the MyChart app! Go to the app store, search "MyChart", open the app, select , and log in with your MyChart username and password.  Due to Covid, a mask is required upon entering the hospital/clinic. If you do not have a mask, one will be given to you upon arrival. For doctor visits, patients may have 1 support person aged 7 or older with them. For treatment visits, patients cannot have anyone with them due to current Covid guidelines and our immunocompromised population.

## 2021-05-22 NOTE — Progress Notes (Signed)
Patient presents today for Paul Norton. Patient's BP 201/81, patient denies chest pain or tightness, patient states his BP always goes up when he comes to the cancer center and will go down when he goes home. Dr. Delton Coombes made aware. Patient okay for treatment per Dr. Delton Coombes with BP of 201/81.  Patient tolerated Daratumumab injection with no complaints voiced. See MAR for details. Lab reviewed. Injection site clean and dry with no bruising or swelling noted at site. Band aid applied.  Patient tolerated Xgeva injection with no complaints voiced. Site clean and dry with no bruising or swelling noted at site. See MAR for details. Band aid applied.  Patient stable during and after injection. VSS with discharge and left in satisfactory condition with no s/s of distress noted.

## 2021-05-22 NOTE — Patient Instructions (Signed)
[facility] at [facility] Discharge Instructions  You were seen and examined today by Dr. Delton Coombes. Proceed with treatment today and in 4 weeks, your Myeloma labs are stable. Dr. Delton Coombes will see you again in 8 weeks.   Thank you for choosing [facility] at [facility] to provide your oncology and hematology care.  To afford each patient quality time with our provider, please arrive at least 15 minutes before your scheduled appointment time.   If you have a lab appointment with the [facility] please come in thru the Main Entrance and check in at the main information desk.  You need to re-schedule your appointment should you arrive 10 or more minutes late.  We strive to give you quality time with our providers, and arriving late affects you and other patients whose appointments are after yours.  Also, if you no show three or more times for appointments you may be dismissed from the clinic at the providers discretion.     Again, thank you for choosing [facility].  Our hope is that these requests will decrease the amount of time that you wait before being seen by our physicians.       _____________________________________________________________  Should you have questions after your visit to [facility], please contact our office at [phone] and follow the prompts.  Our office hours are 8:00 a.m. and 4:30 p.m. Monday - Friday.  Please note that voicemails left after 4:00 p.m. may not be returned until the following business day.  We are closed weekends and major holidays.  You do have access to a nurse 24-7, just call the main number to the clinic [phone] and do not press any options, hold on the line and a nurse will answer the phone.    For prescription refill requests, have your pharmacy contact our office and allow 72 hours.    Due to Covid, you will need to wear a mask upon entering the hospital. If you do  not have a mask, a mask will be given to you at the Main Entrance upon arrival. For doctor visits, patients may have 1 support person age 38 or older with them. For treatment visits, patients can not have anyone with them due to social distancing guidelines and our immunocompromised population.

## 2021-05-22 NOTE — Progress Notes (Signed)
   Ok to proceed with BP  T.O. Dr Rhys Martini, PharmD

## 2021-06-02 ENCOUNTER — Other Ambulatory Visit (HOSPITAL_COMMUNITY): Payer: Self-pay | Admitting: *Deleted

## 2021-06-02 MED ORDER — LENALIDOMIDE 10 MG PO CAPS
10.0000 mg | ORAL_CAPSULE | Freq: Every day | ORAL | 0 refills | Status: DC
Start: 2021-06-02 — End: 2021-06-05

## 2021-06-05 ENCOUNTER — Other Ambulatory Visit (HOSPITAL_COMMUNITY): Payer: Self-pay

## 2021-06-05 MED ORDER — LENALIDOMIDE 10 MG PO CAPS: 10.0000 mg | ORAL_CAPSULE | Freq: Every day | ORAL | 0 refills | Status: DC

## 2021-06-05 NOTE — Telephone Encounter (Signed)
Chart reviewed. Revlimid refilled per last office note with Dr. Katragadda.  

## 2021-06-18 ENCOUNTER — Encounter (HOSPITAL_COMMUNITY): Payer: Self-pay

## 2021-06-19 ENCOUNTER — Encounter (HOSPITAL_COMMUNITY): Payer: Self-pay

## 2021-06-19 ENCOUNTER — Inpatient Hospital Stay (HOSPITAL_COMMUNITY): Payer: Medicaid Other | Attending: Hematology

## 2021-06-19 ENCOUNTER — Other Ambulatory Visit: Payer: Self-pay

## 2021-06-19 ENCOUNTER — Inpatient Hospital Stay (HOSPITAL_COMMUNITY): Payer: Medicaid Other

## 2021-06-19 VITALS — BP 127/75 | HR 73 | Temp 96.9°F | Resp 17 | Ht 70.5 in | Wt 191.7 lb

## 2021-06-19 DIAGNOSIS — C9000 Multiple myeloma not having achieved remission: Secondary | ICD-10-CM | POA: Diagnosis present

## 2021-06-19 DIAGNOSIS — Z5112 Encounter for antineoplastic immunotherapy: Secondary | ICD-10-CM | POA: Diagnosis present

## 2021-06-19 DIAGNOSIS — Z79899 Other long term (current) drug therapy: Secondary | ICD-10-CM | POA: Insufficient documentation

## 2021-06-19 LAB — COMPREHENSIVE METABOLIC PANEL
ALT: 20 U/L (ref 0–44)
AST: 20 U/L (ref 15–41)
Albumin: 4.1 g/dL (ref 3.5–5.0)
Alkaline Phosphatase: 69 U/L (ref 38–126)
Anion gap: 8 (ref 5–15)
BUN: 26 mg/dL — ABNORMAL HIGH (ref 8–23)
CO2: 28 mmol/L (ref 22–32)
Calcium: 8.7 mg/dL — ABNORMAL LOW (ref 8.9–10.3)
Chloride: 100 mmol/L (ref 98–111)
Creatinine, Ser: 1.16 mg/dL (ref 0.61–1.24)
GFR, Estimated: >60 mL/min (ref >60)
Glucose, Bld: 138 mg/dL — ABNORMAL HIGH (ref 70–99)
Potassium: 3.7 mmol/L (ref 3.5–5.1)
Sodium: 136 mmol/L (ref 135–145)
Total Bilirubin: 1.2 mg/dL (ref 0.3–1.2)
Total Protein: 6.4 g/dL — ABNORMAL LOW (ref 6.5–8.1)

## 2021-06-19 LAB — MAGNESIUM: Magnesium: 1.9 mg/dL (ref 1.7–2.4)

## 2021-06-19 LAB — CBC WITH DIFFERENTIAL/PLATELET
Abs Immature Granulocytes: 0.02 10*3/uL (ref 0.00–0.07)
Basophils Absolute: 0.1 10*3/uL (ref 0.0–0.1)
Basophils Relative: 1 %
Eosinophils Absolute: 0.2 10*3/uL (ref 0.0–0.5)
Eosinophils Relative: 4 %
HCT: 40.2 % (ref 39.0–52.0)
Hemoglobin: 13 g/dL (ref 13.0–17.0)
Immature Granulocytes: 0 %
Lymphocytes Relative: 18 %
Lymphs Abs: 1.1 10*3/uL (ref 0.7–4.0)
MCH: 27.1 pg (ref 26.0–34.0)
MCHC: 32.3 g/dL (ref 30.0–36.0)
MCV: 83.8 fL (ref 80.0–100.0)
Monocytes Absolute: 1.3 10*3/uL — ABNORMAL HIGH (ref 0.1–1.0)
Monocytes Relative: 20 %
Neutro Abs: 3.6 10*3/uL (ref 1.7–7.7)
Neutrophils Relative %: 57 %
Platelets: 208 10*3/uL (ref 150–400)
RBC: 4.8 MIL/uL (ref 4.22–5.81)
RDW: 17.2 % — ABNORMAL HIGH (ref 11.5–15.5)
WBC: 6.3 10*3/uL (ref 4.0–10.5)
nRBC: 0 % (ref 0.0–0.2)

## 2021-06-19 LAB — LACTATE DEHYDROGENASE: LDH: 135 U/L (ref 98–192)

## 2021-06-19 MED ORDER — DIPHENHYDRAMINE HCL 25 MG PO CAPS
50.0000 mg | ORAL_CAPSULE | Freq: Once | ORAL | Status: AC
  Administered 2021-06-19: 50 mg via ORAL
  Filled 2021-06-19: qty 2

## 2021-06-19 MED ORDER — DEXAMETHASONE 4 MG PO TABS
40.0000 mg | ORAL_TABLET | Freq: Once | ORAL | Status: AC
  Administered 2021-06-19: 40 mg via ORAL
  Filled 2021-06-19: qty 10

## 2021-06-19 MED ORDER — DENOSUMAB 120 MG/1.7ML ~~LOC~~ SOLN
120.0000 mg | Freq: Once | SUBCUTANEOUS | Status: AC
  Administered 2021-06-19: 120 mg via SUBCUTANEOUS
  Filled 2021-06-19: qty 1.7

## 2021-06-19 MED ORDER — DARATUMUMAB-HYALURONIDASE-FIHJ 1800-30000 MG-UT/15ML ~~LOC~~ SOLN
1800.0000 mg | Freq: Once | SUBCUTANEOUS | Status: AC
  Administered 2021-06-19: 1800 mg via SUBCUTANEOUS
  Filled 2021-06-19: qty 15

## 2021-06-19 MED ORDER — ACETAMINOPHEN 325 MG PO TABS
650.0000 mg | ORAL_TABLET | Freq: Once | ORAL | Status: AC
  Administered 2021-06-19: 650 mg via ORAL
  Filled 2021-06-19: qty 2

## 2021-06-19 NOTE — Patient Instructions (Signed)
[facility]  Discharge Instructions: Thank you for choosing [facility] to provide your oncology and hematology care.  If you have a lab appointment with the [facility], please come in thru the Main Entrance and check in at the main information desk.  Wear comfortable clothing and clothing appropriate for easy access to any Portacath or PICC line.   We strive to give you quality time with your provider. You may need to reschedule your appointment if you arrive late (15 or more minutes).  Arriving late affects you and other patients whose appointments are after yours.  Also, if you miss three or more appointments without notifying the office, you may be dismissed from the clinic at the provider's discretion.      For prescription refill requests, have your pharmacy contact our office and allow 72 hours for refills to be completed.    Today you received the following chemotherapy and/or immunotherapy agents; Xgeva nad Daratumumab, return as scheduled.   To help prevent nausea and vomiting after your treatment, we encourage you to take your nausea medication as directed.  BELOW ARE SYMPTOMS THAT SHOULD BE REPORTED IMMEDIATELY: *FEVER GREATER THAN 100.4 F (38 C) OR HIGHER *CHILLS OR SWEATING *NAUSEA AND VOMITING THAT IS NOT CONTROLLED WITH YOUR NAUSEA MEDICATION *UNUSUAL SHORTNESS OF BREATH *UNUSUAL BRUISING OR BLEEDING *URINARY PROBLEMS (pain or burning when urinating, or frequent urination) *BOWEL PROBLEMS (unusual diarrhea, constipation, pain near the anus) TENDERNESS IN MOUTH AND THROAT WITH OR WITHOUT PRESENCE OF ULCERS (sore throat, sores in mouth, or a toothache) UNUSUAL RASH, SWELLING OR PAIN  UNUSUAL VAGINAL DISCHARGE OR ITCHING   Items with * indicate a potential emergency and should be followed up as soon as possible or go to the Emergency Department if any problems should occur.  Please show the CHEMOTHERAPY ALERT CARD or IMMUNOTHERAPY ALERT CARD at  check-in to the Emergency Department and triage nurse.  Should you have questions after your visit or need to cancel or reschedule your appointment, please contact [facility] [phone]  and follow the prompts.  Office hours are 8:00 a.m. to 4:30 p.m. Monday - Friday. Please note that voicemails left after 4:00 p.m. may not be returned until the following business day.  We are closed weekends and major holidays. You have access to a nurse at all times for urgent questions. Please call the main number to the clinic [phone] and follow the prompts.  For any non-urgent questions, you may also contact your provider using MyChart. We now offer e-Visits for anyone 73 and older to request care online for non-urgent symptoms. For details visit [email or web address].   Also download the MyChart app! Go to the app store, search "MyChart", open the app, select [facility], and log in with your MyChart username and password.  Due to Covid, a mask is required upon entering the hospital/clinic. If you do not have a mask, one will be given to you upon arrival. For doctor visits, patients may have 1 support person aged 66 or older with them. For treatment visits, patients cannot have anyone with them due to current Covid guidelines and our immunocompromised population.

## 2021-06-19 NOTE — Progress Notes (Signed)
Patient taking calcium as directed. Denied tooth, jaw, and leg pain. No recent or upcoming dental visits. Labs reviewed. Patient tolerated injection with no complaints voiced. See MAR for details. Patient stable during and after injection. Site clean and dry with no bruising or swelling noted. Band aid applied. Patient tolerated Daratumumab injection with no complaints voiced. See MAR for details. Lab reviewed. Injection site clean and dry with no bruising or swelling noted at site. Band aid applied. Patient declined AVS. Vss with discharge and left in satisfactory condition with nos/s of distress noted.

## 2021-06-20 ENCOUNTER — Encounter (HOSPITAL_COMMUNITY): Payer: Self-pay

## 2021-06-20 LAB — KAPPA/LAMBDA LIGHT CHAINS
Kappa free light chain: 10.6 mg/L (ref 3.3–19.4)
Kappa, lambda light chain ratio: 1.93 — ABNORMAL HIGH (ref 0.26–1.65)
Lambda free light chains: 5.5 mg/L — ABNORMAL LOW (ref 5.7–26.3)

## 2021-06-21 LAB — PROTEIN ELECTROPHORESIS, SERUM
A/G Ratio: 1.9 — ABNORMAL HIGH (ref 0.7–1.7)
Albumin ELP: 4 g/dL (ref 2.9–4.4)
Alpha-1-Globulin: 0.2 g/dL (ref 0.0–0.4)
Alpha-2-Globulin: 0.7 g/dL (ref 0.4–1.0)
Beta Globulin: 0.9 g/dL (ref 0.7–1.3)
Gamma Globulin: 0.3 g/dL — ABNORMAL LOW (ref 0.4–1.8)
Globulin, Total: 2.1 g/dL — ABNORMAL LOW (ref 2.2–3.9)
Total Protein ELP: 6.1 g/dL (ref 6.0–8.5)

## 2021-06-27 ENCOUNTER — Other Ambulatory Visit (HOSPITAL_COMMUNITY): Payer: Self-pay

## 2021-06-27 MED ORDER — LENALIDOMIDE 10 MG PO CAPS: 10.0000 mg | ORAL_CAPSULE | Freq: Every day | ORAL | 0 refills | Status: DC

## 2021-06-27 NOTE — Telephone Encounter (Signed)
Chart reviewed. Revlimid refilled per last office note with Dr. Katragadda.  

## 2021-07-17 NOTE — Progress Notes (Signed)
[facility] [address], [place] [address]   CLINIC:  Medical Oncology/Hematology  PCP:  Jac Canavan, PA-C [address] / [place] [address] [phone]   REASON FOR VISIT:  Follow-up for multiple myeloma  PRIOR THERAPY: none  NGS Results: not done  CURRENT THERAPY: DaraBorD every 3 weeks; Revlimid 10 mg 3/2 weeks raBorD every 3 weeks; Revlimid 10 mg 3/2 weeks  BRIEF ONCOLOGIC HISTORY:  Oncology History  Multiple myeloma not having achieved remission (HCC)  11/01/2020 Initial Diagnosis   Multiple myeloma not having achieved remission (HCC)   11/02/2020 - 11/02/2020 Chemotherapy          11/21/2020 -  Chemotherapy   Patient is on Treatment Plan : MYELOMA NEWLY DIAGNOSED TRANSPLANT CANDIDATE DaraVRd (Daratumumab SQ) q21d x 6 Cycles (Induction/Consolidation)       CANCER STAGING:  Cancer Staging  No matching staging information was found for the patient.  INTERVAL HISTORY:  Paul Norton, a 66 y.o. male, returns for routine follow-up and consideration for next cycle of chemotherapy. Paul Norton was last seen on 05/22/2021.  Due for cycle #10 of Darzalex Faspro today.   Overall, he tells me he has been feeling pretty well. He denies any recent infection or antibiotic use. He denies tingling/numbness in his hands and feet, n/v/d, skin rash, and new pains. He is taking calcium and vitamin D.   Overall, he feels ready for next cycle of chemo today.   REVIEW OF SYSTEMS:  Review of Systems  Constitutional:  Negative for appetite change and fatigue.  Gastrointestinal:  Negative for diarrhea, nausea and vomiting.  Musculoskeletal:  Negative for arthralgias and myalgias.  Skin:  Negative for rash.  Neurological:  Negative for numbness.  All other systems reviewed and are negative.  PAST MEDICAL/SURGICAL HISTORY:  Past Medical History:  Diagnosis Date   Broken leg    Colonoscopy refused 09/2017   Hyperlipidemia     Hypertension    Multiple myeloma (HCC)    Pneumonia    Refuses treatment 09/2017   refuses screenings such as cancer screening, refuses vaccines, refuses normal preventative care   Vaccine refused by patient    all vaccines as of 09/2017   Past Surgical History:  Procedure Laterality Date   COLONOSCOPY     never, declines as of 09/2017    SOCIAL HISTORY:  Social History   Socioeconomic History   Marital status: Divorced    Spouse name: Not on file   Number of children: 1   Years of education: Not on file   Highest education level: Not on file  Occupational History   Occupation: Disability  Tobacco Use   Smoking status: Never   Smokeless tobacco: Never  Substance and Sexual Activity   Alcohol use: No   Drug use: Never   Sexual activity: Not Currently  Other Topics Concern   Not on file  Social History Narrative   Not on file   Social Determinants of Health   Financial Resource Strain: Medium Risk   Difficulty of Paying Living Expenses: Somewhat hard  Food Insecurity: No Food Insecurity   Worried About Programme researcher, broadcasting/film/video in the Last Year: Never true   Ran Out of Food in the Last Year: Never true  Transportation Needs: No Transportation Needs   Lack of Transportation (Medical): No   Lack of Transportation (Non-Medical): No  Physical Activity: Insufficiently Active   Days of Exercise per Week: 5 days   Minutes of  Exercise per Session: 20 min  Stress: No Stress Concern Present   Feeling of Stress : Not at all  Social Connections: Socially Isolated   Frequency of Communication with Friends and Family: More than three times a week   Frequency of Social Gatherings with Friends and Family: More than three times a week   Attends Religious Services: Never   Marine scientist or Organizations: No   Attends Music therapist: Never   Marital Status: Divorced  Human resources officer Violence: Not At Risk   Fear of Current or Ex-Partner: No   Emotionally  Abused: No   Physically Abused: No   Sexually Abused: No    FAMILY HISTORY:  History reviewed. No pertinent family history.  CURRENT MEDICATIONS:  Current Outpatient Medications  Medication Sig Dispense Refill   acyclovir (ZOVIRAX) 400 MG tablet Take 1 tablet (400 mg total) by mouth 2 (two) times daily. 60 tablet 6   amLODipine (NORVASC) 10 MG tablet TAKE ONE TABLET BY MOUTH ONCE DAILY 30 tablet 3   aspirin 81 MG chewable tablet Chew by mouth daily.     feeding supplement (ENSURE ENLIVE / ENSURE PLUS) LIQD Take 237 mLs by mouth 3 (three) times daily between meals. 237 mL 12   lenalidomide (REVLIMID) 10 MG capsule Take 1 capsule (10 mg total) by mouth daily. 21 days on, 7 days off 21 capsule 0   acetaminophen (TYLENOL) 325 MG tablet Take 2 tablets (650 mg total) by mouth every 6 (six) hours as needed for mild pain (or Fever >/= 101). (Patient not taking: Reported on 05/22/2021) 30 tablet 30   calcium-vitamin D (OSCAL WITH D) 500-200 MG-UNIT tablet Take 1 tablet by mouth 2 (two) times daily. 60 tablet 0   No current facility-administered medications for this visit.    ALLERGIES:  No Known Allergies  PHYSICAL EXAM:  Performance status (ECOG): 1 - Symptomatic but completely ambulatory  Vitals:   07/18/21 1334  Pulse: 71  Resp: 18  Temp: (!) 97.4 F (36.3 C)  SpO2: 100%   Wt Readings from Last 3 Encounters:  07/18/21 187 lb 4.8 oz (85 kg)  06/19/21 191 lb 11.2 oz (87 kg)  05/22/21 187 lb 3.2 oz (84.9 kg)   Physical Exam Vitals reviewed.  Constitutional:      Appearance: Normal appearance.  Cardiovascular:     Rate and Rhythm: Normal rate and regular rhythm.     Pulses: Normal pulses.     Heart sounds: Normal heart sounds.  Pulmonary:     Effort: Pulmonary effort is normal.     Breath sounds: Normal breath sounds.  Neurological:     General: No focal deficit present.     Mental Status: He is alert and oriented to person, place, and time.  Psychiatric:        Mood and  Affect: Mood normal.        Behavior: Behavior normal.    LABORATORY DATA:  I have reviewed the labs as listed.  CBC Latest Ref Rng & Units 07/18/2021 06/19/2021 05/22/2021  WBC 4.0 - 10.5 K/uL 6.4 6.3 7.5  Hemoglobin 13.0 - 17.0 g/dL 13.2 13.0 12.2(L)  Hematocrit 39.0 - 52.0 % 39.5 40.2 38.2(L)  Platelets 150 - 400 K/uL 210 208 199   CMP Latest Ref Rng & Units 06/19/2021 05/22/2021 04/24/2021  Glucose 70 - 99 mg/dL 138(H) 150(H) 142(H)  BUN 8 - 23 mg/dL 26(H) 21 28(H)  Creatinine 0.61 - 1.24 mg/dL 1.16 1.16 1.03  Sodium  135 - 145 mmol/L 136 137 138  Potassium 3.5 - 5.1 mmol/L 3.7 3.6 3.9  Chloride 98 - 111 mmol/L 100 99 102  CO2 22 - 32 mmol/L $RemoveB'28 30 28  'oEqMNvgY$ Calcium 8.9 - 10.3 mg/dL 8.7(L) 8.7(L) 8.4(L)  Total Protein 6.5 - 8.1 g/dL 6.4(L) 6.8 6.5  Total Bilirubin 0.3 - 1.2 mg/dL 1.2 0.9 1.3(H)  Alkaline Phos 38 - 126 U/L 69 72 70  AST 15 - 41 U/L $Remo'20 17 17  'myALL$ ALT 0 - 44 U/L $Remo'20 15 15    'OPVwb$ DIAGNOSTIC IMAGING:  I have independently reviewed the scans and discussed with the patient. No results found.   ASSESSMENT:  1.  IgG kappa light chain multiple myeloma: - Admission with hypercalcemia and renal failure. - CT CAP showed extensive lytic foci throughout the axial and appendicular skeleton of the chest, abdomen and pelvis.  Splenomegaly with an enlarged lobular spleen, nonspecific. - Bone marrow biopsy on 11/01/2020 shows 85% atypical plasma cells in the aspirate. - FISH panel positive for 1 p-,-13,14q-,16q-,17p-/-17 and 20q- - Cytogenetics-no metaphases available for analysis. - SPEP with 5.4 g of M spike.  Free light chain ratio 1030.  LDH normal.  Beta-2 microglobulin 18.7.  24-hour urine total protein 3.6 g. - 6 cycles of Dara VRD from 11/21/2020 through 03/07/2021. - Patient declined bone marrow transplant. - Maintenance Revlimid and monthly daratumumab started on 03/27/2021.   2.  Social/family history: - He worked as a Administrator.  He lives by himself. - Mother with breast and ovarian  cancer.  Father had metastatic kidney cancer.  His brother's daughter has CML.   PLAN:  1.  IgG kappa light chain multiple myeloma, high risk: - I have reviewed myeloma labs from 06/19/2021.  M spike is undetectable.  Light chain ratio has improved to 1.93, kappa light chains 10.6 with lambda light chains 5.5. - He does not report any recurrent infections. - Labs from today shows normal CBC and LFTs.  Total bilirubin is minimally elevated at 1.3.  Creatinine is normal at 1.18. - Proceed with the maintenance Darzalex today and in 4 weeks.  RTC 8 weeks with repeat myeloma labs prior to next visit.   2.  Myeloma bone disease: - Calcium today is 8.7 with albumin 4.3. - Continue calcium and vitamin D supplements.  He will receive denosumab today and in 4 weeks.  3.  ID prophylaxis: - Continue acyclovir twice daily. - Continue aspirin 81 mg daily for thromboprophylaxis.   Orders placed this encounter:  No orders of the defined types were placed in this encounter.    Derek Jack, MD [facility] [phone]   I, Thana Ates, am acting as a scribe for Dr. Derek Jack.  I, Derek Jack MD, have reviewed the above documentation for accuracy and completeness, and I agree with the above.

## 2021-07-18 ENCOUNTER — Inpatient Hospital Stay (HOSPITAL_COMMUNITY): Payer: Medicaid Other | Attending: Hematology

## 2021-07-18 ENCOUNTER — Other Ambulatory Visit: Payer: Self-pay

## 2021-07-18 ENCOUNTER — Encounter (HOSPITAL_COMMUNITY): Payer: Self-pay | Admitting: Hematology

## 2021-07-18 ENCOUNTER — Inpatient Hospital Stay (HOSPITAL_COMMUNITY): Payer: Medicaid Other

## 2021-07-18 ENCOUNTER — Inpatient Hospital Stay (HOSPITAL_BASED_OUTPATIENT_CLINIC_OR_DEPARTMENT_OTHER): Payer: Medicaid Other | Admitting: Hematology

## 2021-07-18 ENCOUNTER — Encounter (HOSPITAL_COMMUNITY): Payer: Self-pay

## 2021-07-18 VITALS — HR 71 | Temp 97.4°F | Resp 18 | Ht 70.5 in | Wt 187.3 lb

## 2021-07-18 VITALS — BP 177/74

## 2021-07-18 DIAGNOSIS — C9000 Multiple myeloma not having achieved remission: Secondary | ICD-10-CM | POA: Insufficient documentation

## 2021-07-18 DIAGNOSIS — Z79899 Other long term (current) drug therapy: Secondary | ICD-10-CM | POA: Insufficient documentation

## 2021-07-18 DIAGNOSIS — Z5112 Encounter for antineoplastic immunotherapy: Secondary | ICD-10-CM | POA: Insufficient documentation

## 2021-07-18 LAB — COMPREHENSIVE METABOLIC PANEL
ALT: 18 U/L (ref 0–44)
AST: 19 U/L (ref 15–41)
Albumin: 4.3 g/dL (ref 3.5–5.0)
Alkaline Phosphatase: 68 U/L (ref 38–126)
Anion gap: 6 (ref 5–15)
BUN: 26 mg/dL — ABNORMAL HIGH (ref 8–23)
CO2: 32 mmol/L (ref 22–32)
Calcium: 8.7 mg/dL — ABNORMAL LOW (ref 8.9–10.3)
Chloride: 100 mmol/L (ref 98–111)
Creatinine, Ser: 1.18 mg/dL (ref 0.61–1.24)
GFR, Estimated: >60 mL/min (ref >60)
Glucose, Bld: 132 mg/dL — ABNORMAL HIGH (ref 70–99)
Potassium: 3.9 mmol/L (ref 3.5–5.1)
Sodium: 138 mmol/L (ref 135–145)
Total Bilirubin: 1.3 mg/dL — ABNORMAL HIGH (ref 0.3–1.2)
Total Protein: 6.6 g/dL (ref 6.5–8.1)

## 2021-07-18 LAB — CBC WITH DIFFERENTIAL/PLATELET
Abs Immature Granulocytes: 0.02 10*3/uL (ref 0.00–0.07)
Basophils Absolute: 0.1 10*3/uL (ref 0.0–0.1)
Basophils Relative: 1 %
Eosinophils Absolute: 0.2 10*3/uL (ref 0.0–0.5)
Eosinophils Relative: 2 %
HCT: 39.5 % (ref 39.0–52.0)
Hemoglobin: 13.2 g/dL (ref 13.0–17.0)
Immature Granulocytes: 0 %
Lymphocytes Relative: 17 %
Lymphs Abs: 1.1 10*3/uL (ref 0.7–4.0)
MCH: 27.5 pg (ref 26.0–34.0)
MCHC: 33.4 g/dL (ref 30.0–36.0)
MCV: 82.3 fL (ref 80.0–100.0)
Monocytes Absolute: 1 10*3/uL (ref 0.1–1.0)
Monocytes Relative: 16 %
Neutro Abs: 4 10*3/uL (ref 1.7–7.7)
Neutrophils Relative %: 64 %
Platelets: 210 10*3/uL (ref 150–400)
RBC: 4.8 MIL/uL (ref 4.22–5.81)
RDW: 16.4 % — ABNORMAL HIGH (ref 11.5–15.5)
WBC: 6.4 10*3/uL (ref 4.0–10.5)
nRBC: 0 % (ref 0.0–0.2)

## 2021-07-18 LAB — MAGNESIUM: Magnesium: 2 mg/dL (ref 1.7–2.4)

## 2021-07-18 MED ORDER — DARATUMUMAB-HYALURONIDASE-FIHJ 1800-30000 MG-UT/15ML ~~LOC~~ SOLN
1800.0000 mg | Freq: Once | SUBCUTANEOUS | Status: AC
  Administered 2021-07-18: 1800 mg via SUBCUTANEOUS
  Filled 2021-07-18: qty 15

## 2021-07-18 MED ORDER — DIPHENHYDRAMINE HCL 25 MG PO CAPS
50.0000 mg | ORAL_CAPSULE | Freq: Once | ORAL | Status: AC
  Administered 2021-07-18: 50 mg via ORAL
  Filled 2021-07-18: qty 2

## 2021-07-18 MED ORDER — DEXAMETHASONE 4 MG PO TABS
40.0000 mg | ORAL_TABLET | Freq: Once | ORAL | Status: AC
  Administered 2021-07-18: 40 mg via ORAL
  Filled 2021-07-18: qty 10

## 2021-07-18 MED ORDER — ACETAMINOPHEN 325 MG PO TABS
650.0000 mg | ORAL_TABLET | Freq: Once | ORAL | Status: AC
  Administered 2021-07-18: 650 mg via ORAL
  Filled 2021-07-18: qty 2

## 2021-07-18 MED ORDER — DENOSUMAB 120 MG/1.7ML ~~LOC~~ SOLN
120.0000 mg | Freq: Once | SUBCUTANEOUS | Status: AC
  Administered 2021-07-18: 120 mg via SUBCUTANEOUS
  Filled 2021-07-18: qty 1.7

## 2021-07-18 NOTE — Patient Instructions (Signed)
[facility]  Discharge Instructions: Thank you for choosing [facility] to provide your oncology and hematology care.  If you have a lab appointment with the Cancer Center, please come in thru the Main Entrance and check in at the main information desk.  Wear comfortable clothing and clothing appropriate for easy access to any Portacath or PICC line.   We strive to give you quality time with your provider. You may need to reschedule your appointment if you arrive late (15 or more minutes).  Arriving late affects you and other patients whose appointments are after yours.  Also, if you miss three or more appointments without notifying the office, you may be dismissed from the clinic at the provider's discretion.      For prescription refill requests, have your pharmacy contact our office and allow 72 hours for refills to be completed.        To help prevent nausea and vomiting after your treatment, we encourage you to take your nausea medication as directed.  BELOW ARE SYMPTOMS THAT SHOULD BE REPORTED IMMEDIATELY: *FEVER GREATER THAN 100.4 F (38 C) OR HIGHER *CHILLS OR SWEATING *NAUSEA AND VOMITING THAT IS NOT CONTROLLED WITH YOUR NAUSEA MEDICATION *UNUSUAL SHORTNESS OF BREATH *UNUSUAL BRUISING OR BLEEDING *URINARY PROBLEMS (pain or burning when urinating, or frequent urination) *BOWEL PROBLEMS (unusual diarrhea, constipation, pain near the anus) TENDERNESS IN MOUTH AND THROAT WITH OR WITHOUT PRESENCE OF ULCERS (sore throat, sores in mouth, or a toothache) UNUSUAL RASH, SWELLING OR PAIN  UNUSUAL VAGINAL DISCHARGE OR ITCHING   Items with * indicate a potential emergency and should be followed up as soon as possible or go to the Emergency Department if any problems should occur.  Please show the CHEMOTHERAPY ALERT CARD or IMMUNOTHERAPY ALERT CARD at check-in to the Emergency Department and triage nurse.  Should you have questions after your visit or need to cancel  or reschedule your appointment, please contact [facility] [phone]  and follow the prompts.  Office hours are 8:00 a.m. to 4:30 p.m. Monday - Friday. Please note that voicemails left after 4:00 p.m. may not be returned until the following business day.  We are closed weekends and major holidays. You have access to a nurse at all times for urgent questions. Please call the main number to the clinic [phone] and follow the prompts.  For any non-urgent questions, you may also contact your provider using MyChart. We now offer e-Visits for anyone 18 and older to request care online for non-urgent symptoms. For details visit [email or web address].   Also download the MyChart app! Go to the app store, search "MyChart", open the app, select [facility], and log in with your MyChart username and password.  Due to Covid, a mask is required upon entering the hospital/clinic. If you do not have a mask, one will be given to you upon arrival. For doctor visits, patients may have 1 support person aged 18 or older with them. For treatment visits, patients cannot have anyone with them due to current Covid guidelines and our immunocompromised population.  

## 2021-07-18 NOTE — Progress Notes (Signed)
Patient has been assessed, vital signs and labs have been reviewed by Dr. Delton Coombes. ANC, Creatinine, LFTs, and Platelets are within treatment parameters per Dr. Delton Coombes. The patient is good to proceed with treatment Dara and Xgeva injection at this time.  Primary RN and pharmacy aware.

## 2021-07-18 NOTE — Progress Notes (Signed)
Patient presents today for dara [place] and Xgeva injections.  Patient is in satisfactory condition with no complaints voiced.  BP today is 177/74.  Dr. Delton Coombes was notified. Ok to proceed with treatment.  All other vital signs are stable.  Labs reviewed by Dr. Delton Coombes during his office visit.  We will proceed with treatment per MD orders.   Patient tolerated Dara [place] and Xgeva injections well with no complaints voiced.  Patient left ambulatory in stable condition.  Vital signs stable at discharge.  Follow up as scheduled.

## 2021-07-18 NOTE — Patient Instructions (Signed)
[place] at [facility] Discharge Instructions  You were seen and examined today by Dr. Delton Coombes. He reviewed your most recent labs and everything looks good. Please keep follow up as scheduled in 2 months.   Thank you for choosing [place] at [facility] to provide your oncology and hematology care.  To afford each patient quality time with our provider, please arrive at least 15 minutes before your scheduled appointment time.   If you have a lab appointment with the [facility] please come in thru the Main Entrance and check in at the main information desk.  You need to re-schedule your appointment should you arrive 10 or more minutes late.  We strive to give you quality time with our providers, and arriving late affects you and other patients whose appointments are after yours.  Also, if you no show three or more times for appointments you may be dismissed from the clinic at the providers discretion.     Again, thank you for choosing [facility]  Our hope is that these requests will decrease the amount of time that you wait before being seen by our physicians.       _____________________________________________________________  Should you have questions after your visit to [facility], please contact our office at [phone] and follow the prompts.  Our office hours are 8:00 a.m. and 4:30 p.m. Monday - Friday.  Please note that voicemails left after 4:00 p.m. may not be returned until the following business day.  We are closed weekends and major holidays.  You do have access to a nurse 24-7, just call the main number to the clinic [phone] and do not press any options, hold on the line and a nurse will answer the phone.    For prescription refill requests, have your pharmacy contact our office and allow 72 hours.    Due to Covid, you will need to wear a mask upon entering the hospital. If you do not have a  mask, a mask will be given to you at the Main Entrance upon arrival. For doctor visits, patients may have 1 support person age 24 or older with them. For treatment visits, patients can not have anyone with them due to social distancing guidelines and our immunocompromised population.

## 2021-07-19 ENCOUNTER — Other Ambulatory Visit (HOSPITAL_COMMUNITY): Payer: Self-pay

## 2021-07-19 ENCOUNTER — Encounter (HOSPITAL_COMMUNITY): Payer: Self-pay | Admitting: Hematology

## 2021-07-19 MED ORDER — LENALIDOMIDE 10 MG PO CAPS: 10.0000 mg | ORAL_CAPSULE | Freq: Every day | ORAL | 0 refills | Status: DC

## 2021-07-19 NOTE — Telephone Encounter (Signed)
Chart reviewed. Revlimid refilled per last office note with Dr. Katragadda.  

## 2021-07-27 ENCOUNTER — Encounter: Payer: Self-pay | Admitting: Internal Medicine

## 2021-08-15 ENCOUNTER — Inpatient Hospital Stay (HOSPITAL_COMMUNITY): Payer: Medicaid Other

## 2021-08-15 ENCOUNTER — Other Ambulatory Visit (HOSPITAL_COMMUNITY): Payer: Self-pay

## 2021-08-15 ENCOUNTER — Other Ambulatory Visit: Payer: Self-pay

## 2021-08-15 ENCOUNTER — Encounter (HOSPITAL_COMMUNITY): Payer: Self-pay

## 2021-08-15 VITALS — BP 176/76 | HR 66 | Temp 98.4°F | Resp 17 | Ht 70.5 in | Wt 191.3 lb

## 2021-08-15 DIAGNOSIS — Z5112 Encounter for antineoplastic immunotherapy: Secondary | ICD-10-CM | POA: Diagnosis not present

## 2021-08-15 DIAGNOSIS — C9000 Multiple myeloma not having achieved remission: Secondary | ICD-10-CM

## 2021-08-15 LAB — COMPREHENSIVE METABOLIC PANEL
ALT: 19 U/L (ref 0–44)
AST: 18 U/L (ref 15–41)
Albumin: 4.1 g/dL (ref 3.5–5.0)
Alkaline Phosphatase: 68 U/L (ref 38–126)
Anion gap: 7 (ref 5–15)
BUN: 26 mg/dL — ABNORMAL HIGH (ref 8–23)
CO2: 30 mmol/L (ref 22–32)
Calcium: 8.4 mg/dL — ABNORMAL LOW (ref 8.9–10.3)
Chloride: 99 mmol/L (ref 98–111)
Creatinine, Ser: 1.2 mg/dL (ref 0.61–1.24)
GFR, Estimated: >60 mL/min (ref >60)
Glucose, Bld: 139 mg/dL — ABNORMAL HIGH (ref 70–99)
Potassium: 3.9 mmol/L (ref 3.5–5.1)
Sodium: 136 mmol/L (ref 135–145)
Total Bilirubin: 1.5 mg/dL — ABNORMAL HIGH (ref 0.3–1.2)
Total Protein: 6.4 g/dL — ABNORMAL LOW (ref 6.5–8.1)

## 2021-08-15 LAB — CBC WITH DIFFERENTIAL/PLATELET
Abs Immature Granulocytes: 0.02 10*3/uL (ref 0.00–0.07)
Basophils Absolute: 0.1 10*3/uL (ref 0.0–0.1)
Basophils Relative: 1 %
Eosinophils Absolute: 0.2 10*3/uL (ref 0.0–0.5)
Eosinophils Relative: 3 %
HCT: 39.7 % (ref 39.0–52.0)
Hemoglobin: 12.6 g/dL — ABNORMAL LOW (ref 13.0–17.0)
Immature Granulocytes: 0 %
Lymphocytes Relative: 17 %
Lymphs Abs: 1 10*3/uL (ref 0.7–4.0)
MCH: 26.2 pg (ref 26.0–34.0)
MCHC: 31.7 g/dL (ref 30.0–36.0)
MCV: 82.5 fL (ref 80.0–100.0)
Monocytes Absolute: 1 10*3/uL (ref 0.1–1.0)
Monocytes Relative: 17 %
Neutro Abs: 3.7 10*3/uL (ref 1.7–7.7)
Neutrophils Relative %: 62 %
Platelets: 176 10*3/uL (ref 150–400)
RBC: 4.81 MIL/uL (ref 4.22–5.81)
RDW: 16.4 % — ABNORMAL HIGH (ref 11.5–15.5)
WBC: 5.9 10*3/uL (ref 4.0–10.5)
nRBC: 0 % (ref 0.0–0.2)

## 2021-08-15 LAB — MAGNESIUM: Magnesium: 1.9 mg/dL (ref 1.7–2.4)

## 2021-08-15 MED ORDER — LENALIDOMIDE 10 MG PO CAPS: 10.0000 mg | ORAL_CAPSULE | Freq: Every day | ORAL | 0 refills | Status: DC

## 2021-08-15 MED ORDER — DARATUMUMAB-HYALURONIDASE-FIHJ 1800-30000 MG-UT/15ML ~~LOC~~ SOLN
1800.0000 mg | Freq: Once | SUBCUTANEOUS | Status: AC
  Administered 2021-08-15: 1800 mg via SUBCUTANEOUS
  Filled 2021-08-15: qty 15

## 2021-08-15 MED ORDER — DENOSUMAB 120 MG/1.7ML ~~LOC~~ SOLN
120.0000 mg | Freq: Once | SUBCUTANEOUS | Status: AC
  Administered 2021-08-15: 120 mg via SUBCUTANEOUS
  Filled 2021-08-15: qty 1.7

## 2021-08-15 MED ORDER — OYSTER SHELL CALCIUM/D3 500-5 MG-MCG PO TABS: 1.0000 | ORAL_TABLET | Freq: Every day | ORAL | 3 refills | Status: DC

## 2021-08-15 MED ORDER — DIPHENHYDRAMINE HCL 25 MG PO CAPS
50.0000 mg | ORAL_CAPSULE | Freq: Once | ORAL | Status: AC
  Administered 2021-08-15: 50 mg via ORAL
  Filled 2021-08-15: qty 2

## 2021-08-15 MED ORDER — ACETAMINOPHEN 325 MG PO TABS
650.0000 mg | ORAL_TABLET | Freq: Once | ORAL | Status: AC
  Administered 2021-08-15: 650 mg via ORAL
  Filled 2021-08-15: qty 2

## 2021-08-15 MED ORDER — DEXAMETHASONE 4 MG PO TABS
40.0000 mg | ORAL_TABLET | Freq: Once | ORAL | Status: AC
  Administered 2021-08-15: 40 mg via ORAL
  Filled 2021-08-15: qty 10

## 2021-08-15 NOTE — Telephone Encounter (Signed)
Chart reviewed. Revlimid refilled per last office note with Dr. Katragadda.  

## 2021-08-15 NOTE — Patient Instructions (Signed)
[facility]  Discharge Instructions: Thank you for choosing [facility] to provide your oncology and hematology care.  If you have a lab appointment with the [facility], please come in thru the Main Entrance and check in at the main information desk.  Wear comfortable clothing and clothing appropriate for easy access to any Portacath or PICC line.   We strive to give you quality time with your provider. You may need to reschedule your appointment if you arrive late (15 or more minutes).  Arriving late affects you and other patients whose appointments are after yours.  Also, if you miss three or more appointments without notifying the office, you may be dismissed from the clinic at the providers discretion.      For prescription refill requests, have your pharmacy contact our office and allow 72 hours for refills to be completed.    Today you received the following chemotherapy and/or immunotherapy agents Darzalex Faspro and Xgeva injection.       To help prevent nausea and vomiting after your treatment, we encourage you to take your nausea medication as directed.  BELOW ARE SYMPTOMS THAT SHOULD BE REPORTED IMMEDIATELY: *FEVER GREATER THAN 100.4 F (38 C) OR HIGHER *CHILLS OR SWEATING *NAUSEA AND VOMITING THAT IS NOT CONTROLLED WITH YOUR NAUSEA MEDICATION *UNUSUAL SHORTNESS OF BREATH *UNUSUAL BRUISING OR BLEEDING *URINARY PROBLEMS (pain or burning when urinating, or frequent urination) *BOWEL PROBLEMS (unusual diarrhea, constipation, pain near the anus) TENDERNESS IN MOUTH AND THROAT WITH OR WITHOUT PRESENCE OF ULCERS (sore throat, sores in mouth, or a toothache) UNUSUAL RASH, SWELLING OR PAIN  UNUSUAL VAGINAL DISCHARGE OR ITCHING   Items with * indicate a potential emergency and should be followed up as soon as possible or go to the Emergency Department if any problems should occur.  Please show the CHEMOTHERAPY ALERT CARD or IMMUNOTHERAPY ALERT CARD at  check-in to the Emergency Department and triage nurse.  Should you have questions after your visit or need to cancel or reschedule your appointment, please contact [facility] [phone]  and follow the prompts.  Office hours are 8:00 a.m. to 4:30 p.m. Monday - Friday. Please note that voicemails left after 4:00 p.m. may not be returned until the following business day.  We are closed weekends and major holidays. You have access to a nurse at all times for urgent questions. Please call the main number to the clinic [phone] and follow the prompts.  For any non-urgent questions, you may also contact your provider using MyChart. We now offer e-Visits for anyone 7 and older to request care online for non-urgent symptoms. For details visit [email or web address].   Also download the MyChart app! Go to the app store, search "MyChart", open the app, select [facility], and log in with your MyChart username and password.  Due to Covid, a mask is required upon entering the hospital/clinic. If you do not have a mask, one will be given to you upon arrival. For doctor visits, patients may have 1 support person aged 69 or older with them. For treatment visits, patients cannot have anyone with them due to current Covid guidelines and our immunocompromised population.

## 2021-08-15 NOTE — Progress Notes (Signed)
Patient presents today for Darzalex Faspro SQ injection. Labs within parameters for today's treatment. Blood pressure 176/76 on arrival. Patient denies any headaches, blurred vision or dizziness. No complaints of any changes today. Patient's normal blood pressure. MAR reviewed. Patient states he is given his Revlimid by his brother and taking as prescribed with no side effects noted. Patient states he is taking Calcium-Vitamin D ( Oscal with Vitamin D 500-200 mg unit). Patient denies any upcoming dental work and denies jaw pain.   Treatment given today per MD orders. Tolerated without adverse affects. Vital signs stable. No complaints at this time. Discharged from clinic ambulatory in stable condition. Alert and oriented x 3. F/U with [facility] as scheduled.

## 2021-08-16 LAB — KAPPA/LAMBDA LIGHT CHAINS
Kappa free light chain: 11.7 mg/L (ref 3.3–19.4)
Kappa, lambda light chain ratio: 1.8 — ABNORMAL HIGH (ref 0.26–1.65)
Lambda free light chains: 6.5 mg/L (ref 5.7–26.3)

## 2021-08-17 ENCOUNTER — Other Ambulatory Visit (HOSPITAL_COMMUNITY): Payer: Self-pay

## 2021-08-17 ENCOUNTER — Encounter (HOSPITAL_COMMUNITY): Payer: Self-pay

## 2021-08-17 DIAGNOSIS — C9000 Multiple myeloma not having achieved remission: Secondary | ICD-10-CM

## 2021-08-17 LAB — PROTEIN ELECTROPHORESIS, SERUM
A/G Ratio: 1.7 (ref 0.7–1.7)
Albumin ELP: 3.7 g/dL (ref 2.9–4.4)
Alpha-1-Globulin: 0.3 g/dL (ref 0.0–0.4)
Alpha-2-Globulin: 0.7 g/dL (ref 0.4–1.0)
Beta Globulin: 0.9 g/dL (ref 0.7–1.3)
Gamma Globulin: 0.3 g/dL — ABNORMAL LOW (ref 0.4–1.8)
Globulin, Total: 2.2 g/dL (ref 2.2–3.9)
Total Protein ELP: 5.9 g/dL — ABNORMAL LOW (ref 6.0–8.5)

## 2021-08-17 MED ORDER — OYSTER SHELL CALCIUM/D3 500-5 MG-MCG PO TABS: 1.0000 | ORAL_TABLET | Freq: Every day | ORAL | 3 refills | Status: DC

## 2021-08-18 LAB — IMMUNOFIXATION ELECTROPHORESIS
IgA: 27 mg/dL — ABNORMAL LOW (ref 61–437)
IgG (Immunoglobin G), Serum: 356 mg/dL — ABNORMAL LOW (ref 603–1613)
IgM (Immunoglobulin M), Srm: 8 mg/dL — ABNORMAL LOW (ref 20–172)
Total Protein ELP: 5.9 g/dL — ABNORMAL LOW (ref 6.0–8.5)

## 2021-09-05 ENCOUNTER — Inpatient Hospital Stay (HOSPITAL_COMMUNITY): Payer: Medicaid Other | Attending: Hematology

## 2021-09-05 ENCOUNTER — Other Ambulatory Visit: Payer: Self-pay

## 2021-09-05 ENCOUNTER — Other Ambulatory Visit (HOSPITAL_COMMUNITY): Payer: Self-pay | Admitting: Hematology

## 2021-09-05 DIAGNOSIS — Z7982 Long term (current) use of aspirin: Secondary | ICD-10-CM | POA: Insufficient documentation

## 2021-09-05 DIAGNOSIS — Z79899 Other long term (current) drug therapy: Secondary | ICD-10-CM | POA: Diagnosis not present

## 2021-09-05 DIAGNOSIS — C9000 Multiple myeloma not having achieved remission: Secondary | ICD-10-CM | POA: Insufficient documentation

## 2021-09-05 DIAGNOSIS — Z803 Family history of malignant neoplasm of breast: Secondary | ICD-10-CM | POA: Diagnosis not present

## 2021-09-05 DIAGNOSIS — D649 Anemia, unspecified: Secondary | ICD-10-CM | POA: Insufficient documentation

## 2021-09-05 LAB — COMPREHENSIVE METABOLIC PANEL
ALT: 17 U/L (ref 0–44)
AST: 15 U/L (ref 15–41)
Albumin: 3.8 g/dL (ref 3.5–5.0)
Alkaline Phosphatase: 64 U/L (ref 38–126)
Anion gap: 9 (ref 5–15)
BUN: 30 mg/dL — ABNORMAL HIGH (ref 8–23)
CO2: 28 mmol/L (ref 22–32)
Calcium: 8.6 mg/dL — ABNORMAL LOW (ref 8.9–10.3)
Chloride: 102 mmol/L (ref 98–111)
Creatinine, Ser: 1.19 mg/dL (ref 0.61–1.24)
GFR, Estimated: >60 mL/min (ref >60)
Glucose, Bld: 120 mg/dL — ABNORMAL HIGH (ref 70–99)
Potassium: 4.2 mmol/L (ref 3.5–5.1)
Sodium: 139 mmol/L (ref 135–145)
Total Bilirubin: 1.1 mg/dL (ref 0.3–1.2)
Total Protein: 6.5 g/dL (ref 6.5–8.1)

## 2021-09-05 LAB — CBC WITH DIFFERENTIAL/PLATELET
Abs Immature Granulocytes: 0.01 10*3/uL (ref 0.00–0.07)
Basophils Absolute: 0.1 10*3/uL (ref 0.0–0.1)
Basophils Relative: 1 %
Eosinophils Absolute: 0.1 10*3/uL (ref 0.0–0.5)
Eosinophils Relative: 1 %
HCT: 38.2 % — ABNORMAL LOW (ref 39.0–52.0)
Hemoglobin: 12.5 g/dL — ABNORMAL LOW (ref 13.0–17.0)
Immature Granulocytes: 0 %
Lymphocytes Relative: 17 %
Lymphs Abs: 1.2 10*3/uL (ref 0.7–4.0)
MCH: 27.3 pg (ref 26.0–34.0)
MCHC: 32.7 g/dL (ref 30.0–36.0)
MCV: 83.4 fL (ref 80.0–100.0)
Monocytes Absolute: 1.1 10*3/uL — ABNORMAL HIGH (ref 0.1–1.0)
Monocytes Relative: 15 %
Neutro Abs: 4.8 10*3/uL (ref 1.7–7.7)
Neutrophils Relative %: 66 %
Platelets: 229 10*3/uL (ref 150–400)
RBC: 4.58 MIL/uL (ref 4.22–5.81)
RDW: 15.8 % — ABNORMAL HIGH (ref 11.5–15.5)
WBC: 7.3 10*3/uL (ref 4.0–10.5)
nRBC: 0 % (ref 0.0–0.2)

## 2021-09-05 LAB — MAGNESIUM: Magnesium: 2 mg/dL (ref 1.7–2.4)

## 2021-09-12 ENCOUNTER — Other Ambulatory Visit: Payer: Self-pay

## 2021-09-12 ENCOUNTER — Inpatient Hospital Stay (HOSPITAL_BASED_OUTPATIENT_CLINIC_OR_DEPARTMENT_OTHER): Payer: Medicaid Other | Admitting: Hematology

## 2021-09-12 ENCOUNTER — Inpatient Hospital Stay (HOSPITAL_COMMUNITY): Payer: Medicaid Other

## 2021-09-12 VITALS — BP 190/81

## 2021-09-12 VITALS — HR 68 | Temp 97.2°F | Resp 18 | Ht 70.5 in | Wt 190.6 lb

## 2021-09-12 DIAGNOSIS — C9000 Multiple myeloma not having achieved remission: Secondary | ICD-10-CM

## 2021-09-12 MED ORDER — DENOSUMAB 120 MG/1.7ML ~~LOC~~ SOLN
120.0000 mg | Freq: Once | SUBCUTANEOUS | Status: AC
  Administered 2021-09-12: 120 mg via SUBCUTANEOUS
  Filled 2021-09-12: qty 1.7

## 2021-09-12 MED ORDER — ACETAMINOPHEN 325 MG PO TABS
650.0000 mg | ORAL_TABLET | Freq: Once | ORAL | Status: AC
  Administered 2021-09-12: 650 mg via ORAL
  Filled 2021-09-12: qty 2

## 2021-09-12 MED ORDER — DIPHENHYDRAMINE HCL 25 MG PO CAPS
50.0000 mg | ORAL_CAPSULE | Freq: Once | ORAL | Status: AC
  Administered 2021-09-12: 50 mg via ORAL
  Filled 2021-09-12: qty 2

## 2021-09-12 MED ORDER — DARATUMUMAB-HYALURONIDASE-FIHJ 1800-30000 MG-UT/15ML ~~LOC~~ SOLN
1800.0000 mg | Freq: Once | SUBCUTANEOUS | Status: AC
  Administered 2021-09-12: 1800 mg via SUBCUTANEOUS
  Filled 2021-09-12: qty 15

## 2021-09-12 MED ORDER — DEXAMETHASONE 4 MG PO TABS
40.0000 mg | ORAL_TABLET | Freq: Once | ORAL | Status: AC
  Administered 2021-09-12: 40 mg via ORAL
  Filled 2021-09-12: qty 10

## 2021-09-12 NOTE — Progress Notes (Signed)
Patients blood pressure reviewed with oncologist.  Ok to treat today verbal order Dr. Delton Coombes.    Patient tolerated Daratumumab injection with no complaints voiced.  See MAR for details.  Labs reviewed. Injection site clean and dry with no bruising or swelling noted at site.  Band aid applied.  Vss with discharge and left in satisfactory condition with no s/s of distress noted.

## 2021-09-12 NOTE — Patient Instructions (Signed)
[facility] at [facility] ?Discharge Instructions ? ?You were seen and examined today by Dr. Katragadda. He reviewed your most recent labs and everything looks good. Please keep follow up appointments as scheduled. ? ? ?Thank you for choosing [facility] at [facility] to provide your oncology and hematology care.  To afford each patient quality time with our provider, please arrive at least 15 minutes before your scheduled appointment time.  ? ?If you have a lab appointment with the Cancer Center please come in thru the Main Entrance and check in at the main information desk. ? ?You need to re-schedule your appointment should you arrive 10 or more minutes late.  We strive to give you quality time with our providers, and arriving late affects you and other patients whose appointments are after yours.  Also, if you no show three or more times for appointments you may be dismissed from the clinic at the providers discretion.     ?Again, thank you for choosing [facility].  Our hope is that these requests will decrease the amount of time that you wait before being seen by our physicians.       ?_____________________________________________________________ ? ?Should you have questions after your visit to [facility], please contact our office at [phone] and follow the prompts.  Our office hours are 8:00 a.m. and 4:30 p.m. Monday - Friday.  Please note that voicemails left after 4:00 p.m. may not be returned until the following business day.  We are closed weekends and major holidays.  You do have access to a nurse 24-7, just call the main number to the clinic [phone] and do not press any options, hold on the line and a nurse will answer the phone.   ? ?For prescription refill requests, have your pharmacy contact our office and allow 72 hours.   ? ?Due to Covid, you will need to wear a mask upon entering the hospital. If you do not have a  mask, a mask will be given to you at the Main Entrance upon arrival. For doctor visits, patients may have 1 support person age 18 or older with them. For treatment visits, patients can not have anyone with them due to social distancing guidelines and our immunocompromised population.  ? ?  ?

## 2021-09-12 NOTE — Progress Notes (Signed)
Patient has been assessed, vital signs and labs have been reviewed by Dr. Katragadda. ANC, Creatinine, LFTs, and Platelets are within treatment parameters per Dr. Katragadda. The patient is good to proceed with treatment at this time. Primary RN and pharmacy aware.  

## 2021-09-12 NOTE — Patient Instructions (Signed)
[facility]  Discharge Instructions: Thank you for choosing [facility] to provide your oncology and hematology care.  If you have a lab appointment with the Cancer Center, please come in thru the Main Entrance and check in at the main information desk.  Wear comfortable clothing and clothing appropriate for easy access to any Portacath or PICC line.   We strive to give you quality time with your provider. You may need to reschedule your appointment if you arrive late (15 or more minutes).  Arriving late affects you and other patients whose appointments are after yours.  Also, if you miss three or more appointments without notifying the office, you may be dismissed from the clinic at the provider's discretion.      For prescription refill requests, have your pharmacy contact our office and allow 72 hours for refills to be completed.        To help prevent nausea and vomiting after your treatment, we encourage you to take your nausea medication as directed.  BELOW ARE SYMPTOMS THAT SHOULD BE REPORTED IMMEDIATELY: *FEVER GREATER THAN 100.4 F (38 C) OR HIGHER *CHILLS OR SWEATING *NAUSEA AND VOMITING THAT IS NOT CONTROLLED WITH YOUR NAUSEA MEDICATION *UNUSUAL SHORTNESS OF BREATH *UNUSUAL BRUISING OR BLEEDING *URINARY PROBLEMS (pain or burning when urinating, or frequent urination) *BOWEL PROBLEMS (unusual diarrhea, constipation, pain near the anus) TENDERNESS IN MOUTH AND THROAT WITH OR WITHOUT PRESENCE OF ULCERS (sore throat, sores in mouth, or a toothache) UNUSUAL RASH, SWELLING OR PAIN  UNUSUAL VAGINAL DISCHARGE OR ITCHING   Items with * indicate a potential emergency and should be followed up as soon as possible or go to the Emergency Department if any problems should occur.  Please show the CHEMOTHERAPY ALERT CARD or IMMUNOTHERAPY ALERT CARD at check-in to the Emergency Department and triage nurse.  Should you have questions after your visit or need to cancel  or reschedule your appointment, please contact [facility] [phone]  and follow the prompts.  Office hours are 8:00 a.m. to 4:30 p.m. Monday - Friday. Please note that voicemails left after 4:00 p.m. may not be returned until the following business day.  We are closed weekends and major holidays. You have access to a nurse at all times for urgent questions. Please call the main number to the clinic [phone] and follow the prompts.  For any non-urgent questions, you may also contact your provider using MyChart. We now offer e-Visits for anyone 18 and older to request care online for non-urgent symptoms. For details visit [email or web address].   Also download the MyChart app! Go to the app store, search "MyChart", open the app, select , and log in with your MyChart username and password.  Due to Covid, a mask is required upon entering the hospital/clinic. If you do not have a mask, one will be given to you upon arrival. For doctor visits, patients may have 1 support person aged 18 or older with them. For treatment visits, patients cannot have anyone with them due to current Covid guidelines and our immunocompromised population.  

## 2021-09-12 NOTE — Progress Notes (Signed)
Paul Norton, [place] [address]   CLINIC:  Medical Oncology/Hematology  PCP:  Carlena Hurl, PA-C [address] / [place] [address] [phone]   REASON FOR VISIT:  Follow-up for multiple myeloma  PRIOR THERAPY: none  NGS Results: not done  CURRENT THERAPY: DaraBorD every 3 weeks; Revlimid 10 mg 3/2 weeks raBorD every 3 weeks; Revlimid 10 mg 3/2 weeks  BRIEF ONCOLOGIC HISTORY:  Oncology History  Multiple myeloma not having achieved remission ([place])  11/01/2020 Initial Diagnosis   Multiple myeloma not having achieved remission ([place])   11/02/2020 - 11/02/2020 Chemotherapy          11/21/2020 -  Chemotherapy   Patient is on Treatment Plan : MYELOMA NEWLY DIAGNOSED TRANSPLANT CANDIDATE DaraVRd (Daratumumab SQ) q21d x 6 Cycles (Induction/Consolidation)       CANCER STAGING:  Cancer Staging  No matching staging information was found for the patient.  INTERVAL HISTORY:  [name], a 66 y.o. male, returns for routine follow-up and consideration for next cycle of chemotherapy. Marcella was last seen on 07/18/2021.  Due for cycle #12 of DARZALEX FASPRO today.   Overall, he tells me he has been feeling pretty well. He denies recent infections. He denies skin rash, n/v/d, jaw pain, dental issues, fatigue, and tingling/numbness.   Overall, he feels ready for next cycle of chemo today.   REVIEW OF SYSTEMS:  Review of Systems  Constitutional:  Negative for appetite change and fatigue.  Gastrointestinal:  Negative for diarrhea, nausea and vomiting.  Skin:  Negative for rash.  Neurological:  Negative for numbness.  All other systems reviewed and are negative.   PAST MEDICAL/SURGICAL HISTORY:  Past Medical History:  Diagnosis Date   Broken leg    Colonoscopy refused 09/2017   Hyperlipidemia    Hypertension    Multiple myeloma ([place])    Pneumonia    Refuses treatment 09/2017   refuses screenings such as cancer  screening, refuses vaccines, refuses normal preventative care   Vaccine refused by patient    all vaccines as of 09/2017   Past Surgical History:  Procedure Laterality Date   COLONOSCOPY     never, declines as of 09/2017    SOCIAL HISTORY:  Social History   Socioeconomic History   Marital status: Divorced    Spouse name: Not on file   Number of children: 1   Years of education: Not on file   Highest education level: Not on file  Occupational History   Occupation: Disability  Tobacco Use   Smoking status: Never   Smokeless tobacco: Never  Substance and Sexual Activity   Alcohol use: No   Drug use: Never   Sexual activity: Not Currently  Other Topics Concern   Not on file  Social History Narrative   Not on file   Social Determinants of Health   Financial Resource Strain: Medium Risk   Difficulty of Paying Living Expenses: Somewhat hard  Food Insecurity: No Food Insecurity   Worried About Charity fundraiser in the Last Year: Never true   [place] in the Last Year: Never true  Transportation Needs: No Transportation Needs   Lack of Transportation (Medical): No   Lack of Transportation (Non-Medical): No  Physical Activity: Insufficiently Active   Days of Exercise per Week: 5 days   Minutes of Exercise per Session: 20 min  Stress: No Stress Concern Present   Feeling of Stress : Not at all  Social Connections: Socially Isolated   Frequency of Communication with Friends and Family: More than three times a week   Frequency of Social Gatherings with Friends and Family: More than three times a week   Attends Religious Services: Never   Marine scientist or Organizations: No   Attends Music therapist: Never   Marital Status: Divorced  Human resources officer Violence: Not At Risk   Fear of Current or Ex-Partner: No   Emotionally Abused: No   Physically Abused: No   Sexually Abused: No    FAMILY HISTORY:  No family history on file.  CURRENT  MEDICATIONS:  Current Outpatient Medications  Medication Sig Dispense Refill   acetaminophen (TYLENOL) 325 MG tablet Take 2 tablets (650 mg total) by mouth every 6 (six) hours as needed for mild pain (or Fever >/= 101). 30 tablet 30   acyclovir (ZOVIRAX) 400 MG tablet Take 1 tablet (400 mg total) by mouth 2 (two) times daily. 60 tablet 6   amLODipine (NORVASC) 10 MG tablet TAKE ONE TABLET BY MOUTH DAILY 30 tablet 3   aspirin 81 MG chewable tablet Chew by mouth daily.     calcium-vitamin D (OSCAL WITH D) 500-5 MG-MCG tablet Take 1 tablet by mouth daily. 30 tablet 3   feeding supplement (ENSURE ENLIVE / ENSURE PLUS) LIQD Take 237 mLs by mouth 3 (three) times daily between meals. 237 mL 12   lenalidomide (REVLIMID) 10 MG capsule Take 1 capsule (10 mg total) by mouth daily. 21 days on, 7 days off 21 capsule 0   No current facility-administered medications for this visit.    ALLERGIES:  No Known Allergies  PHYSICAL EXAM:  Performance status (ECOG): 1 - Symptomatic but completely ambulatory  There were no vitals filed for this visit. Wt Readings from Last 3 Encounters:  08/15/21 191 lb 4.8 oz (86.8 kg)  07/18/21 187 lb 4.8 oz (85 kg)  06/19/21 191 lb 11.2 oz (87 kg)   Physical Exam Vitals reviewed.  Constitutional:      Appearance: Normal appearance.  Cardiovascular:     Rate and Rhythm: Normal rate and regular rhythm.     Pulses: Normal pulses.     Heart sounds: Normal heart sounds.  Pulmonary:     Effort: Pulmonary effort is normal.     Breath sounds: Normal breath sounds.  Neurological:     General: No focal deficit present.     Mental Status: He is alert and oriented to person, place, and time.  Psychiatric:        Mood and Affect: Mood normal.        Behavior: Behavior normal.    LABORATORY DATA:  I have reviewed the labs as listed.  CBC Latest Ref Rng & Units 09/05/2021 08/15/2021 07/18/2021  WBC 4.0 - 10.5 K/uL 7.3 5.9 6.4  Hemoglobin 13.0 - 17.0 g/dL 12.5(L) 12.6(L)  13.2  Hematocrit 39.0 - 52.0 % 38.2(L) 39.7 39.5  Platelets 150 - 400 K/uL 229 176 210   CMP Latest Ref Rng & Units 09/05/2021 08/15/2021 07/18/2021  Glucose 70 - 99 mg/dL 120(H) 139(H) 132(H)  BUN 8 - 23 mg/dL 30(H) 26(H) 26(H)  Creatinine 0.61 - 1.24 mg/dL 1.19 1.20 1.18  Sodium 135 - 145 mmol/L 139 136 138  Potassium 3.5 - 5.1 mmol/L 4.2 3.9 3.9  Chloride 98 - 111 mmol/L 102 99 100  CO2 22 - 32 mmol/L 28 30 32  Calcium 8.9 - 10.3 mg/dL 8.6(L) 8.4(L) 8.7(L)  Total Protein 6.5 -  8.1 g/dL 6.5 6.4(L) 6.6  Total Bilirubin 0.3 - 1.2 mg/dL 1.1 1.5(H) 1.3(H)  Alkaline Phos 38 - 126 U/L 64 68 68  AST 15 - 41 U/L _0 ALT 0 - 44 U/L _1 DIAGNOSTIC IMAGING:  I have independently reviewed the scans and discussed with the patient. No results found.   ASSESSMENT:  1.  IgG kappa light chain multiple myeloma: - Admission with hypercalcemia and renal failure. - CT CAP showed extensive lytic foci throughout the axial and appendicular skeleton of the chest, abdomen and pelvis.  Splenomegaly with an enlarged lobular spleen, nonspecific. - Bone marrow biopsy on 11/01/2020 shows 85% atypical plasma cells in the aspirate. - FISH panel positive for 1 p-,-13,14q-,16q-,17p-/-17 and 20q- - Cytogenetics-no metaphases available for analysis. - SPEP with 5.4 g of M spike.  Free light chain ratio 1030.  LDH normal.  Beta-2 microglobulin 18.7.  24-hour urine total protein 3.6 g. - 6 cycles of Dara VRD from 11/21/2020 through 03/07/2021. - Patient declined bone marrow transplant. - Maintenance Revlimid and monthly daratumumab started on 03/27/2021.   2.  Social/family history: - He worked as a Administrator.  He lives by himself. - Mother with breast and ovarian cancer.  Father had metastatic kidney cancer.  His brother's daughter has CML.   PLAN:  1.  IgG kappa light chain multiple myeloma, high risk: - I have reviewed myeloma labs from 08/15/2021.  M spike is not seen.  Kappa light chains 11.7 and  lambda light chain 6.5 with ratio of 1.8.  Immunofixation positive for IgG kappa. - Reviewed labs from 09/05/2021 which showed normal LFTs and creatinine.  CBC shows mild normocytic anemia, therapy related. - He denies any new onset pains.  No infections.  No neuropathy. - Continue Revlimid 10 mg 3 weeks on/1 week off. - Continue maintenance Darzalex every 4 weeks. - RTC 8 weeks for follow-up with repeat myeloma labs.   2.  Myeloma bone disease: - Calcium today is 8.6 with albumin 3.8. - Continue calcium and vitamin D supplements. - Continue denosumab monthly.  No side effects noted.  3.  ID prophylaxis: - Continue acyclovir twice daily. - Continue aspirin 81 mg for thromboprophylaxis.   Orders placed this encounter:  No orders of the defined types were placed in this encounter.    Derek Jack, MD [place] [phone]   I, Thana Ates, am acting as a scribe for Dr. Derek Jack.  I, Derek Jack MD, have reviewed the above documentation for accuracy and completeness, and I agree with the above.

## 2021-09-14 ENCOUNTER — Other Ambulatory Visit (HOSPITAL_COMMUNITY): Payer: Self-pay

## 2021-09-14 MED ORDER — LENALIDOMIDE 10 MG PO CAPS: 10.0000 mg | ORAL_CAPSULE | Freq: Every day | ORAL | 0 refills | Status: DC

## 2021-09-14 NOTE — Telephone Encounter (Signed)
Chart reviewed. Revlimid refilled per last office note with Dr. Katragadda.  

## 2021-09-18 ENCOUNTER — Encounter (HOSPITAL_COMMUNITY): Payer: Self-pay | Admitting: Hematology

## 2021-09-18 ENCOUNTER — Other Ambulatory Visit (HOSPITAL_COMMUNITY): Payer: Self-pay

## 2021-10-03 ENCOUNTER — Telehealth (HOSPITAL_COMMUNITY): Payer: Self-pay | Admitting: Pharmacy Technician

## 2021-10-03 ENCOUNTER — Other Ambulatory Visit (HOSPITAL_COMMUNITY): Payer: Self-pay

## 2021-10-03 NOTE — Telephone Encounter (Signed)
Oral Oncology Patient Advocate Encounter ? ?Received completed renewal application for [place] from patient and office for Revlimid.  ? ?Application completed and faxed to [phone].  ? ?[employer]MS[employer] patient assistance phone number for follow up is [phone].  ? ?This encounter will be updated until final determination.  ? ?Dennison Nancy CPHT ?Specialty Pharmacy Patient Advocate ?[place] ?Phone [phone] ?Fax [phone] ?10/03/2021 11:53 AM ? ?

## 2021-10-09 ENCOUNTER — Other Ambulatory Visit (HOSPITAL_COMMUNITY): Payer: Self-pay

## 2021-10-09 NOTE — Telephone Encounter (Signed)
Oral Oncology Patient Advocate Encounter ? ?Received notification from [place] that patient has been denied enrollment into their program to receive Revlimid from the drug manufacturer.  Patient has a managed medicaid plan and can fill at any specialty pharmacy that accepts his insurance and has access to Revlimid. ? ?Patient knows to call the office with questions or concerns. ?  ?Oral Oncology Clinic will continue to follow. ? ?Dennison Nancy CPHT ?Specialty Pharmacy Patient Advocate ?[place] ?Phone [phone] ?Fax [phone] ?10/09/2021 12:41 PM ? ? ? ?

## 2021-10-10 ENCOUNTER — Other Ambulatory Visit: Payer: Self-pay

## 2021-10-10 ENCOUNTER — Inpatient Hospital Stay (HOSPITAL_COMMUNITY): Payer: Medicaid Other

## 2021-10-10 ENCOUNTER — Inpatient Hospital Stay (HOSPITAL_COMMUNITY): Payer: Medicaid Other | Attending: Hematology

## 2021-10-10 VITALS — BP 175/69 | HR 59 | Temp 96.6°F | Resp 18 | Ht 69.49 in | Wt 188.5 lb

## 2021-10-10 DIAGNOSIS — C9000 Multiple myeloma not having achieved remission: Secondary | ICD-10-CM

## 2021-10-10 DIAGNOSIS — Z79899 Other long term (current) drug therapy: Secondary | ICD-10-CM | POA: Diagnosis not present

## 2021-10-10 DIAGNOSIS — Z5112 Encounter for antineoplastic immunotherapy: Secondary | ICD-10-CM | POA: Insufficient documentation

## 2021-10-10 LAB — CBC WITH DIFFERENTIAL/PLATELET
Abs Immature Granulocytes: 0.03 10*3/uL (ref 0.00–0.07)
Basophils Absolute: 0.1 10*3/uL (ref 0.0–0.1)
Basophils Relative: 1 %
Eosinophils Absolute: 0.2 10*3/uL (ref 0.0–0.5)
Eosinophils Relative: 3 %
HCT: 41 % (ref 39.0–52.0)
Hemoglobin: 12.9 g/dL — ABNORMAL LOW (ref 13.0–17.0)
Immature Granulocytes: 1 %
Lymphocytes Relative: 17 %
Lymphs Abs: 1.1 10*3/uL (ref 0.7–4.0)
MCH: 26.2 pg (ref 26.0–34.0)
MCHC: 31.5 g/dL (ref 30.0–36.0)
MCV: 83.3 fL (ref 80.0–100.0)
Monocytes Absolute: 1 10*3/uL (ref 0.1–1.0)
Monocytes Relative: 16 %
Neutro Abs: 4.1 10*3/uL (ref 1.7–7.7)
Neutrophils Relative %: 62 %
Platelets: 189 10*3/uL (ref 150–400)
RBC: 4.92 MIL/uL (ref 4.22–5.81)
RDW: 15.9 % — ABNORMAL HIGH (ref 11.5–15.5)
WBC: 6.5 10*3/uL (ref 4.0–10.5)
nRBC: 0 % (ref 0.0–0.2)

## 2021-10-10 LAB — COMPREHENSIVE METABOLIC PANEL
ALT: 14 U/L (ref 0–44)
AST: 17 U/L (ref 15–41)
Albumin: 4.2 g/dL (ref 3.5–5.0)
Alkaline Phosphatase: 64 U/L (ref 38–126)
Anion gap: 7 (ref 5–15)
BUN: 29 mg/dL — ABNORMAL HIGH (ref 8–23)
CO2: 28 mmol/L (ref 22–32)
Calcium: 8.6 mg/dL — ABNORMAL LOW (ref 8.9–10.3)
Chloride: 103 mmol/L (ref 98–111)
Creatinine, Ser: 1.08 mg/dL (ref 0.61–1.24)
GFR, Estimated: >60 mL/min (ref >60)
Glucose, Bld: 137 mg/dL — ABNORMAL HIGH (ref 70–99)
Potassium: 3.7 mmol/L (ref 3.5–5.1)
Sodium: 138 mmol/L (ref 135–145)
Total Bilirubin: 1.4 mg/dL — ABNORMAL HIGH (ref 0.3–1.2)
Total Protein: 6.3 g/dL — ABNORMAL LOW (ref 6.5–8.1)

## 2021-10-10 MED ORDER — DENOSUMAB 120 MG/1.7ML ~~LOC~~ SOLN
120.0000 mg | Freq: Once | SUBCUTANEOUS | Status: AC
  Administered 2021-10-10: 120 mg via SUBCUTANEOUS
  Filled 2021-10-10: qty 1.7

## 2021-10-10 MED ORDER — DIPHENHYDRAMINE HCL 25 MG PO CAPS
50.0000 mg | ORAL_CAPSULE | Freq: Once | ORAL | Status: AC
  Administered 2021-10-10: 50 mg via ORAL
  Filled 2021-10-10: qty 2

## 2021-10-10 MED ORDER — ACETAMINOPHEN 325 MG PO TABS
650.0000 mg | ORAL_TABLET | Freq: Once | ORAL | Status: AC
  Administered 2021-10-10: 650 mg via ORAL
  Filled 2021-10-10: qty 2

## 2021-10-10 MED ORDER — DARATUMUMAB-HYALURONIDASE-FIHJ 1800-30000 MG-UT/15ML ~~LOC~~ SOLN
1800.0000 mg | Freq: Once | SUBCUTANEOUS | Status: AC
  Administered 2021-10-10: 1800 mg via SUBCUTANEOUS
  Filled 2021-10-10: qty 15

## 2021-10-10 MED ORDER — DEXAMETHASONE 4 MG PO TABS
40.0000 mg | ORAL_TABLET | Freq: Once | ORAL | Status: AC
  Administered 2021-10-10: 40 mg via ORAL
  Filled 2021-10-10: qty 10

## 2021-10-10 NOTE — Patient Instructions (Signed)
[facility]  Discharge Instructions: ?Thank you for choosing [facility] to provide your oncology and hematology care.  ?If you have a lab appointment with the [facility], please come in thru the Main Entrance and check in at the main information desk. ? ?Wear comfortable clothing and clothing appropriate for easy access to any Portacath or PICC line.  ? ?We strive to give you quality time with your provider. You may need to reschedule your appointment if you arrive late (15 or more minutes).  Arriving late affects you and other patients whose appointments are after yours.  Also, if you miss three or more appointments without notifying the office, you may be dismissed from the clinic at the provider?s discretion.    ?  ?For prescription refill requests, have your pharmacy contact our office and allow 72 hours for refills to be completed.   ? ?Today you received the following chemotherapy and/or immunotherapy agents darzalex and xgeva.  ?  ?To help prevent nausea and vomiting after your treatment, we encourage you to take your nausea medication as directed. ? ?BELOW ARE SYMPTOMS THAT SHOULD BE REPORTED IMMEDIATELY: ?*FEVER GREATER THAN 100.4 F (38 ?C) OR HIGHER ?*CHILLS OR SWEATING ?*NAUSEA AND VOMITING THAT IS NOT CONTROLLED WITH YOUR NAUSEA MEDICATION ?*UNUSUAL SHORTNESS OF BREATH ?*UNUSUAL BRUISING OR BLEEDING ?*URINARY PROBLEMS (pain or burning when urinating, or frequent urination) ?*BOWEL PROBLEMS (unusual diarrhea, constipation, pain near the anus) ?TENDERNESS IN MOUTH AND THROAT WITH OR WITHOUT PRESENCE OF ULCERS (sore throat, sores in mouth, or a toothache) ?UNUSUAL RASH, SWELLING OR PAIN  ?UNUSUAL VAGINAL DISCHARGE OR ITCHING  ? ?Items with * indicate a potential emergency and should be followed up as soon as possible or go to the Emergency Department if any problems should occur. ? ?Please show the CHEMOTHERAPY ALERT CARD or IMMUNOTHERAPY ALERT CARD at check-in to the  Emergency Department and triage nurse. ? ?Should you have questions after your visit or need to cancel or reschedule your appointment, please contact [facility] [phone]  and follow the prompts.  Office hours are 8:00 a.m. to 4:30 p.m. Monday - Friday. Please note that voicemails left after 4:00 p.m. may not be returned until the following business day.  We are closed weekends and major holidays. You have access to a nurse at all times for urgent questions. Please call the main number to the clinic [phone] and follow the prompts. ? ?For any non-urgent questions, you may also contact your provider using MyChart. We now offer e-Visits for anyone 45 and older to request care online for non-urgent symptoms. For details visit [email or web address]. ?  ?Also download the MyChart app! Go to the app store, search "MyChart", open the app, select [facility], and log in with your MyChart username and password. ? ?Due to Covid, a mask is required upon entering the hospital/clinic. If you do not have a mask, one will be given to you upon arrival. For doctor visits, patients may have 1 support person aged 76 or older with them. For treatment visits, patients cannot have anyone with them due to current Covid guidelines and our immunocompromised population.  ?

## 2021-10-10 NOTE — Progress Notes (Signed)
Patient tolerated Daratumumab injection with no complaints voiced.  See MAR for details.  Labs reviewed. Injection site clean and dry with no bruising or swelling noted at site.  Band aid applied.  Vss with discharge and left in satisfactory condition with no s/s of distress noted.   ? ?Patient taking calcium as directed.  Denied tooth, jaw, and leg pain.  No recent or upcoming dental visits.  Labs reviewed.  Patient tolerated injection with no complaints voiced.  See MAR for details.  Patient stable during and after injection.  Site clean and dry with no bruising or swelling noted.  Band aid applied.  Vss with discharge and left in satisfactory condition with no s/s of distress noted.   ?

## 2021-10-11 LAB — PROTEIN ELECTROPHORESIS, SERUM
A/G Ratio: 1.5 (ref 0.7–1.7)
Albumin ELP: 3.4 g/dL (ref 2.9–4.4)
Alpha-1-Globulin: 0.2 g/dL (ref 0.0–0.4)
Alpha-2-Globulin: 0.8 g/dL (ref 0.4–1.0)
Beta Globulin: 0.9 g/dL (ref 0.7–1.3)
Gamma Globulin: 0.4 g/dL (ref 0.4–1.8)
Globulin, Total: 2.3 g/dL (ref 2.2–3.9)
Total Protein ELP: 5.7 g/dL — ABNORMAL LOW (ref 6.0–8.5)

## 2021-10-11 LAB — KAPPA/LAMBDA LIGHT CHAINS
Kappa free light chain: 11.4 mg/L (ref 3.3–19.4)
Kappa, lambda light chain ratio: 1.81 — ABNORMAL HIGH (ref 0.26–1.65)
Lambda free light chains: 6.3 mg/L (ref 5.7–26.3)

## 2021-10-13 LAB — IMMUNOFIXATION ELECTROPHORESIS
IgA: 30 mg/dL — ABNORMAL LOW (ref 61–437)
IgG (Immunoglobin G), Serum: 399 mg/dL — ABNORMAL LOW (ref 603–1613)
IgM (Immunoglobulin M), Srm: 13 mg/dL — ABNORMAL LOW (ref 20–172)
Total Protein ELP: 6 g/dL (ref 6.0–8.5)

## 2021-10-16 ENCOUNTER — Encounter (HOSPITAL_COMMUNITY): Payer: Self-pay

## 2021-10-19 ENCOUNTER — Other Ambulatory Visit (HOSPITAL_COMMUNITY): Payer: Self-pay

## 2021-10-19 MED ORDER — LENALIDOMIDE 10 MG PO CAPS: 10.0000 mg | ORAL_CAPSULE | Freq: Every day | ORAL | 0 refills | Status: DC

## 2021-10-19 NOTE — Telephone Encounter (Signed)
Chart reviewed. Revlimid refilled per last office note with Dr. Katragadda.  

## 2021-10-26 ENCOUNTER — Encounter (HOSPITAL_COMMUNITY): Payer: Self-pay

## 2021-10-26 ENCOUNTER — Other Ambulatory Visit (HOSPITAL_COMMUNITY): Payer: Self-pay

## 2021-10-26 MED ORDER — LENALIDOMIDE 10 MG PO CAPS: 10.0000 mg | ORAL_CAPSULE | Freq: Every day | ORAL | 0 refills | Status: DC

## 2021-10-26 NOTE — Progress Notes (Signed)
The following message was received via MyChart: "Can you give me a call asap I need to speak with you. [phone]. Concerning his medicine please." Call placed and spoke with Tammy, patient's sister in law who requests that Revlimid prescription be sent to [facility] by [facility] for this refill, prescription sent. Discussed patient's most recent office visit with Dr. Delton Coombes who recommended continuing current treatment plan with no change. The call was dropped at that point. ?

## 2021-11-07 ENCOUNTER — Inpatient Hospital Stay (HOSPITAL_COMMUNITY): Payer: Medicaid Other

## 2021-11-07 ENCOUNTER — Inpatient Hospital Stay (HOSPITAL_COMMUNITY): Payer: Medicaid Other | Attending: Hematology | Admitting: Hematology

## 2021-11-07 VITALS — HR 66 | Temp 98.0°F | Resp 18 | Ht 70.5 in | Wt 188.9 lb

## 2021-11-07 DIAGNOSIS — C9000 Multiple myeloma not having achieved remission: Secondary | ICD-10-CM

## 2021-11-07 DIAGNOSIS — Z5112 Encounter for antineoplastic immunotherapy: Secondary | ICD-10-CM | POA: Insufficient documentation

## 2021-11-07 DIAGNOSIS — Z79899 Other long term (current) drug therapy: Secondary | ICD-10-CM | POA: Diagnosis not present

## 2021-11-07 LAB — COMPREHENSIVE METABOLIC PANEL
ALT: 20 U/L (ref 0–44)
AST: 18 U/L (ref 15–41)
Albumin: 4.1 g/dL (ref 3.5–5.0)
Alkaline Phosphatase: 68 U/L (ref 38–126)
Anion gap: 8 (ref 5–15)
BUN: 27 mg/dL — ABNORMAL HIGH (ref 8–23)
CO2: 29 mmol/L (ref 22–32)
Calcium: 8.7 mg/dL — ABNORMAL LOW (ref 8.9–10.3)
Chloride: 103 mmol/L (ref 98–111)
Creatinine, Ser: 1.08 mg/dL (ref 0.61–1.24)
GFR, Estimated: >60 mL/min (ref >60)
Glucose, Bld: 124 mg/dL — ABNORMAL HIGH (ref 70–99)
Potassium: 3.8 mmol/L (ref 3.5–5.1)
Sodium: 140 mmol/L (ref 135–145)
Total Bilirubin: 1.2 mg/dL (ref 0.3–1.2)
Total Protein: 6.6 g/dL (ref 6.5–8.1)

## 2021-11-07 LAB — CBC WITH DIFFERENTIAL/PLATELET
Abs Immature Granulocytes: 0.03 10*3/uL (ref 0.00–0.07)
Basophils Absolute: 0.1 10*3/uL (ref 0.0–0.1)
Basophils Relative: 1 %
Eosinophils Absolute: 0.2 10*3/uL (ref 0.0–0.5)
Eosinophils Relative: 3 %
HCT: 40.4 % (ref 39.0–52.0)
Hemoglobin: 13.1 g/dL (ref 13.0–17.0)
Immature Granulocytes: 0 %
Lymphocytes Relative: 16 %
Lymphs Abs: 1.1 10*3/uL (ref 0.7–4.0)
MCH: 26.7 pg (ref 26.0–34.0)
MCHC: 32.4 g/dL (ref 30.0–36.0)
MCV: 82.4 fL (ref 80.0–100.0)
Monocytes Absolute: 0.9 10*3/uL (ref 0.1–1.0)
Monocytes Relative: 14 %
Neutro Abs: 4.5 10*3/uL (ref 1.7–7.7)
Neutrophils Relative %: 66 %
Platelets: 187 10*3/uL (ref 150–400)
RBC: 4.9 MIL/uL (ref 4.22–5.81)
RDW: 16 % — ABNORMAL HIGH (ref 11.5–15.5)
WBC: 6.8 10*3/uL (ref 4.0–10.5)
nRBC: 0 % (ref 0.0–0.2)

## 2021-11-07 LAB — MAGNESIUM: Magnesium: 1.9 mg/dL (ref 1.7–2.4)

## 2021-11-07 MED ORDER — DIPHENHYDRAMINE HCL 25 MG PO CAPS
50.0000 mg | ORAL_CAPSULE | Freq: Once | ORAL | Status: AC
  Administered 2021-11-07: 50 mg via ORAL
  Filled 2021-11-07: qty 2

## 2021-11-07 MED ORDER — ACETAMINOPHEN 325 MG PO TABS
650.0000 mg | ORAL_TABLET | Freq: Once | ORAL | Status: AC
  Administered 2021-11-07: 650 mg via ORAL
  Filled 2021-11-07: qty 2

## 2021-11-07 MED ORDER — DENOSUMAB 120 MG/1.7ML ~~LOC~~ SOLN
120.0000 mg | Freq: Once | SUBCUTANEOUS | Status: AC
  Administered 2021-11-07: 120 mg via SUBCUTANEOUS
  Filled 2021-11-07: qty 1.7

## 2021-11-07 MED ORDER — DARATUMUMAB-HYALURONIDASE-FIHJ 1800-30000 MG-UT/15ML ~~LOC~~ SOLN
1800.0000 mg | Freq: Once | SUBCUTANEOUS | Status: AC
  Administered 2021-11-07: 1800 mg via SUBCUTANEOUS
  Filled 2021-11-07: qty 15

## 2021-11-07 MED ORDER — DEXAMETHASONE 4 MG PO TABS
40.0000 mg | ORAL_TABLET | Freq: Once | ORAL | Status: AC
  Administered 2021-11-07: 40 mg via ORAL
  Filled 2021-11-07: qty 10

## 2021-11-07 NOTE — Patient Instructions (Signed)
[facility]  Discharge Instructions: ?Thank you for choosing [facility] to provide your oncology and hematology care.  ?If you have a lab appointment with the [facility], please come in thru the Main Entrance and check in at the main information desk. ? ?Wear comfortable clothing and clothing appropriate for easy access to any Portacath or PICC line.  ? ?We strive to give you quality time with your provider. You may need to reschedule your appointment if you arrive late (15 or more minutes).  Arriving late affects you and other patients whose appointments are after yours.  Also, if you miss three or more appointments without notifying the office, you may be dismissed from the clinic at the provider?s discretion.    ?  ?For prescription refill requests, have your pharmacy contact our office and allow 72 hours for refills to be completed.   ? ?Today you received the following chemotherapy and/or immunotherapy agents Daratumumab and Xgeva    ?  ?To help prevent nausea and vomiting after your treatment, we encourage you to take your nausea medication as directed. ? ?BELOW ARE SYMPTOMS THAT SHOULD BE REPORTED IMMEDIATELY: ?*FEVER GREATER THAN 100.4 F (38 ?C) OR HIGHER ?*CHILLS OR SWEATING ?*NAUSEA AND VOMITING THAT IS NOT CONTROLLED WITH YOUR NAUSEA MEDICATION ?*UNUSUAL SHORTNESS OF BREATH ?*UNUSUAL BRUISING OR BLEEDING ?*URINARY PROBLEMS (pain or burning when urinating, or frequent urination) ?*BOWEL PROBLEMS (unusual diarrhea, constipation, pain near the anus) ?TENDERNESS IN MOUTH AND THROAT WITH OR WITHOUT PRESENCE OF ULCERS (sore throat, sores in mouth, or a toothache) ?UNUSUAL RASH, SWELLING OR PAIN  ?UNUSUAL VAGINAL DISCHARGE OR ITCHING  ? ?Items with * indicate a potential emergency and should be followed up as soon as possible or go to the Emergency Department if any problems should occur. ? ?Please show the CHEMOTHERAPY ALERT CARD or IMMUNOTHERAPY ALERT CARD at check-in to the  Emergency Department and triage nurse. ? ?Should you have questions after your visit or need to cancel or reschedule your appointment, please contact [facility] [phone]  and follow the prompts.  Office hours are 8:00 a.m. to 4:30 p.m. Monday - Friday. Please note that voicemails left after 4:00 p.m. may not be returned until the following business day.  We are closed weekends and major holidays. You have access to a nurse at all times for urgent questions. Please call the main number to the clinic [phone] and follow the prompts. ? ?For any non-urgent questions, you may also contact your provider using MyChart. We now offer e-Visits for anyone 48 and older to request care online for non-urgent symptoms. For details visit [email or web address]. ?  ?Also download the MyChart app! Go to the app store, search "MyChart", open the app, select [facility], and log in with your MyChart username and password. ? ?Due to Covid, a mask is required upon entering the hospital/clinic. If you do not have a mask, one will be given to you upon arrival. For doctor visits, patients may have 1 support person aged 12 or older with them. For treatment visits, patients cannot have anyone with them due to current Covid guidelines and our immunocompromised population.  ?

## 2021-11-07 NOTE — Progress Notes (Signed)
? ?Paul Norton ?[address] ?[place], [place] [address] ? ? ?CLINIC:  ?Medical Oncology/Hematology ? ?PCP:  ?Tysinger, Camelia Eng, PA-C ?[address] / [place] [address] ?[phone] ? ? ?REASON FOR VISIT:  ?Follow-up for multiple myeloma ? ?PRIOR THERAPY: none ? ?NGS Results: not done ? ?CURRENT THERAPY: DaraBorD every 3 weeks; Revlimid 10 mg 3/2 weeks raBorD every 3 weeks; Revlimid 10 mg 3/2 weeks ? ?BRIEF ONCOLOGIC HISTORY:  ?Oncology History  ?Multiple myeloma not having achieved remission ([place])  ?11/01/2020 Initial Diagnosis  ? Multiple myeloma not having achieved remission ([facility]) ? ?  ?11/02/2020 - 11/02/2020 Chemotherapy  ?  ? ?  ? ?  ?11/21/2020 -  Chemotherapy  ? Patient is on Treatment Plan : MYELOMA NEWLY DIAGNOSED TRANSPLANT CANDIDATE DaraVRd (Daratumumab SQ) q21d x 6 Cycles (Induction/Consolidation)  ? ?  ?  ? ? ?CANCER STAGING: ? Cancer Staging  ?No matching staging information was found for the patient. ? ?INTERVAL HISTORY:  ?[name], a 66 y.o. male, returns for routine follow-up and consideration for next cycle of chemotherapy. Paul Norton was last seen on 09/12/2021. ? ?Due for cycle #14 of DaraVRd today.  ? ?Overall, he tells me he has been feeling pretty well. He denies new pains, tingling/numbness, diarrhea, constipation, jaw pain, and fatigue. He denies ankle swellings.  ? ?Overall, he feels ready for next cycle of chemo today.  ? ? ?REVIEW OF SYSTEMS:  ?Review of Systems  ?Constitutional:  Negative for appetite change and fatigue.  ?Cardiovascular:  Negative for leg swelling.  ?Gastrointestinal:  Negative for constipation and diarrhea.  ?Neurological:  Negative for numbness.  ?All other systems reviewed and are negative. ? ?PAST MEDICAL/SURGICAL HISTORY:  ?Past Medical History:  ?Diagnosis Date  ? Broken leg   ? Colonoscopy refused 09/2017  ? Hyperlipidemia   ? Hypertension   ? Multiple myeloma ([place])   ? Pneumonia   ? Refuses treatment 09/2017  ? refuses screenings  such as cancer screening, refuses vaccines, refuses normal preventative care  ? Vaccine refused by patient   ? all vaccines as of 09/2017  ? ?Past Surgical History:  ?Procedure Laterality Date  ? COLONOSCOPY    ? never, declines as of 09/2017  ? ? ?SOCIAL HISTORY:  ?Social History  ? ?Socioeconomic History  ? Marital status: Divorced  ?  Spouse name: Not on file  ? Number of children: 1  ? Years of education: Not on file  ? Highest education level: Not on file  ?Occupational History  ? Occupation: Disability  ?Tobacco Use  ? Smoking status: Never  ? Smokeless tobacco: Never  ?Substance and Sexual Activity  ? Alcohol use: No  ? Drug use: Never  ? Sexual activity: Not Currently  ?Other Topics Concern  ? Not on file  ?Social History Narrative  ? Not on file  ? ?Social Determinants of Health  ? ?Financial Resource Strain: Medium Risk  ? Difficulty of Paying Living Expenses: Somewhat hard  ?Food Insecurity: No Food Insecurity  ? Worried About Charity fundraiser in the Last Year: Never true  ? Ran Out of Food in the Last Year: Never true  ?Transportation Needs: No Transportation Needs  ? Lack of Transportation (Medical): No  ? Lack of Transportation (Non-Medical): No  ?Physical Activity: Insufficiently Active  ? Days of Exercise per Week: 5 days  ? Minutes of Exercise per Session: 20 min  ?Stress: No Stress Concern Present  ? Feeling of Stress : Not at  all  ?Social Connections: Socially Isolated  ? Frequency of Communication with Friends and Family: More than three times a week  ? Frequency of Social Gatherings with Friends and Family: More than three times a week  ? Attends Religious Services: Never  ? Active Member of Clubs or Organizations: No  ? Attends Archivist Meetings: Never  ? Marital Status: Divorced  ?Intimate Partner Violence: Not At Risk  ? Fear of Current or Ex-Partner: No  ? Emotionally Abused: No  ? Physically Abused: No  ? Sexually Abused: No  ? ? ?FAMILY HISTORY:  ?No family history on  file. ? ?CURRENT MEDICATIONS:  ?Current Outpatient Medications  ?Medication Sig Dispense Refill  ? acetaminophen (TYLENOL) 325 MG tablet Take 2 tablets (650 mg total) by mouth every 6 (six) hours as needed for mild pain (or Fever >/= 101). 30 tablet 30  ? acyclovir (ZOVIRAX) 400 MG tablet Take 1 tablet (400 mg total) by mouth 2 (two) times daily. 60 tablet 6  ? amLODipine (NORVASC) 10 MG tablet TAKE ONE TABLET BY MOUTH DAILY 30 tablet 3  ? aspirin 81 MG chewable tablet Chew by mouth daily.    ? calcium-vitamin D (OSCAL WITH D) 500-5 MG-MCG tablet Take 1 tablet by mouth daily. 30 tablet 3  ? feeding supplement (ENSURE ENLIVE / ENSURE PLUS) LIQD Take 237 mLs by mouth 3 (three) times daily between meals. 237 mL 12  ? lenalidomide (REVLIMID) 10 MG capsule Take 1 capsule (10 mg total) by mouth daily. 21 days on, 7 days off 21 capsule 0  ? ?No current facility-administered medications for this visit.  ? ? ?ALLERGIES:  ?No Known Allergies ? ?PHYSICAL EXAM:  ?Performance status (ECOG): 1 - Symptomatic but completely ambulatory ? ?There were no vitals filed for this visit. ?Wt Readings from Last 3 Encounters:  ?10/10/21 188 lb 8 oz (85.5 kg)  ?09/12/21 190 lb 9.6 oz (86.5 kg)  ?08/15/21 191 lb 4.8 oz (86.8 kg)  ? ?Physical Exam ?Vitals reviewed.  ?Constitutional:   ?   Appearance: Normal appearance.  ?Cardiovascular:  ?   Rate and Rhythm: Normal rate and regular rhythm.  ?   Pulses: Normal pulses.  ?   Heart sounds: Normal heart sounds.  ?Pulmonary:  ?   Effort: Pulmonary effort is normal.  ?   Breath sounds: Normal breath sounds.  ?Musculoskeletal:  ?   Right lower leg: No edema.  ?   Left lower leg: No edema.  ?Neurological:  ?   General: No focal deficit present.  ?   Mental Status: He is alert and oriented to person, place, and time.  ?Psychiatric:     ?   Mood and Affect: Mood normal.     ?   Behavior: Behavior normal.  ? ? ?LABORATORY DATA:  ?I have reviewed the labs as listed.  ? ?  Latest Ref Rng & Units 11/07/2021  ?  12:10 PM 10/10/2021  ? 10:09 AM 09/05/2021  ? 10:33 AM  ?CBC  ?WBC 4.0 - 10.5 K/uL 6.8   6.5   7.3    ?Hemoglobin 13.0 - 17.0 g/dL 13.1   12.9   12.5    ?Hematocrit 39.0 - 52.0 % 40.4   41.0   38.2    ?Platelets 150 - 400 K/uL 187   189   229    ? ? ?  Latest Ref Rng & Units 11/07/2021  ? 12:10 PM 10/10/2021  ? 10:09 AM 09/05/2021  ? 10:33 AM  ?  CMP  ?Glucose 70 - 99 mg/dL 124   137   120    ?BUN 8 - 23 mg/dL $Remove'27   29   30    'WZmobOA$ ?Creatinine 0.61 - 1.24 mg/dL 1.08   1.08   1.19    ?Sodium 135 - 145 mmol/L 140   138   139    ?Potassium 3.5 - 5.1 mmol/L 3.8   3.7   4.2    ?Chloride 98 - 111 mmol/L 103   103   102    ?CO2 22 - 32 mmol/L $RemoveB'29   28   28    'fRkZytzQ$ ?Calcium 8.9 - 10.3 mg/dL 8.7   8.6   8.6    ?Total Protein 6.5 - 8.1 g/dL 6.6   6.3   6.5    ?Total Bilirubin 0.3 - 1.2 mg/dL 1.2   1.4   1.1    ?Alkaline Phos 38 - 126 U/L 68   64   64    ?AST 15 - 41 U/L $Remo'18   17   15    'fSVNy$ ?ALT 0 - 44 U/L $Remo'20   14   17    'KnjvE$ ? ? ?DIAGNOSTIC IMAGING:  ?I have independently reviewed the scans and discussed with the patient. ?No results found.  ? ?ASSESSMENT:  ?1.  IgG kappa light chain multiple myeloma: ?- Admission with hypercalcemia and renal failure. ?- CT CAP showed extensive lytic foci throughout the axial and appendicular skeleton of the chest, abdomen and pelvis.  Splenomegaly with an enlarged lobular spleen, nonspecific. ?- Bone marrow biopsy on 11/01/2020 shows 85% atypical plasma cells in the aspirate. ?- FISH panel positive for 1 p-,-13,14q-,16q-,17p-/-17 and 20q- ?- Cytogenetics-no metaphases available for analysis. ?- SPEP with 5.4 g of M spike.  Free light chain ratio 1030.  LDH normal.  Beta-2 microglobulin 18.7.  24-hour urine total protein 3.6 g. ?- 6 cycles of Dara VRD from 11/21/2020 through 03/07/2021. ?- Patient declined bone marrow transplant. ?- Maintenance Revlimid and monthly daratumumab started on 03/27/2021. ?  ?2.  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 ?- Mother with breast and ovarian cancer.   Father had metastatic kidney cancer.  His brother's daughter has CML. ? ? ?PLAN:  ?1.  IgG kappa light chain multiple myeloma, high risk: ?- Myeloma labs from 10/10/2021 showed M spike not detected.  [employer]

## 2021-11-07 NOTE — Progress Notes (Signed)
Patient is taking Revlimid as prescribed.  He has not missed any doses and reports no side effects at this time.   ? ?Patient has been examined by Dr. Delton Coombes, and vital signs and labs have been reviewed. ANC, Creatinine, LFTs, hemoglobin, and platelets are within treatment parameters per M.D. - pt may proceed with treatment w/o blood pressure documented per Dr. Delton Coombes. The pt checks his BP at home and states it is normal. It is always elevated in the clinic and makes him anxious.  ?

## 2021-11-07 NOTE — Progress Notes (Signed)
Patient presents today for Xgeva and Daratumumab injections per providers orders.  Labs within parameters for treatment.  Stable during administration without incident; injection sites WNL; see MAR for injection details.  Patient tolerated procedure well and without incident.  No questions or complaints noted at this time.  Discharge from clinic ambulatory in stable condition.  Alert and oriented X 3.  Follow up with [facility] as scheduled.  ?

## 2021-11-07 NOTE — Patient Instructions (Addendum)
[place] at [facility] ?Discharge Instructions ? ? ?You were seen and examined today by Dr. Delton Coombes. ? ?He reviewed your lab work which is normal/stable.  ? ? ? ? ? ? ?Thank you for choosing [place] at [facility] to provide your oncology and hematology care.  To afford each patient quality time with our provider, please arrive at least 15 minutes before your scheduled appointment time.  ? ?If you have a lab appointment with the [facility] please come in thru the Main Entrance and check in at the main information desk. ? ?You need to re-schedule your appointment should you arrive 10 or more minutes late.  We strive to give you quality time with our providers, and arriving late affects you and other patients whose appointments are after yours.  Also, if you no show three or more times for appointments you may be dismissed from the clinic at the providers discretion.     ?Again, thank you for choosing [facility].  Our hope is that these requests will decrease the amount of time that you wait before being seen by our physicians.       ?_____________________________________________________________ ? ?Should you have questions after your visit to [facility], please contact our office at [phone] and follow the prompts.  Our office hours are 8:00 a.m. and 4:30 p.m. Monday - Friday.  Please note that voicemails left after 4:00 p.m. may not be returned until the following business day.  We are closed weekends and major holidays.  You do have access to a nurse 24-7, just call the main number to the clinic [phone] and do not press any options, hold on the line and a nurse will answer the phone.   ? ?For prescription refill requests, have your pharmacy contact our office and allow 72 hours.   ? ?Due to Covid, you will need to wear a mask upon entering the hospital. If you do not have a mask, a mask will be given to you at the Main  Entrance upon arrival. For doctor visits, patients may have 1 support person age 34 or older with them. For treatment visits, patients can not have anyone with them due to social distancing guidelines and our immunocompromised population.  ? ?   ?

## 2021-11-17 ENCOUNTER — Other Ambulatory Visit (HOSPITAL_COMMUNITY): Payer: Self-pay

## 2021-11-17 ENCOUNTER — Encounter (HOSPITAL_COMMUNITY): Payer: Self-pay | Admitting: Hematology

## 2021-11-17 MED ORDER — LENALIDOMIDE 10 MG PO CAPS: 10.0000 mg | ORAL_CAPSULE | Freq: Every day | ORAL | 0 refills | Status: DC

## 2021-11-17 NOTE — Telephone Encounter (Signed)
Chart reviewed. Revlimid refilled per last office note with Dr. Katragadda.  

## 2021-12-05 ENCOUNTER — Inpatient Hospital Stay (HOSPITAL_COMMUNITY): Payer: Medicaid Other

## 2021-12-05 ENCOUNTER — Inpatient Hospital Stay (HOSPITAL_COMMUNITY): Payer: Medicaid Other | Attending: Hematology

## 2021-12-05 ENCOUNTER — Encounter (HOSPITAL_COMMUNITY): Payer: Self-pay

## 2021-12-05 VITALS — BP 186/63 | HR 62 | Temp 97.2°F | Resp 18 | Ht 70.0 in | Wt 187.0 lb

## 2021-12-05 DIAGNOSIS — Z5112 Encounter for antineoplastic immunotherapy: Secondary | ICD-10-CM | POA: Insufficient documentation

## 2021-12-05 DIAGNOSIS — Z79899 Other long term (current) drug therapy: Secondary | ICD-10-CM | POA: Diagnosis not present

## 2021-12-05 DIAGNOSIS — C9000 Multiple myeloma not having achieved remission: Secondary | ICD-10-CM

## 2021-12-05 LAB — CBC WITH DIFFERENTIAL/PLATELET
Abs Immature Granulocytes: 0.05 10*3/uL (ref 0.00–0.07)
Basophils Absolute: 0.1 10*3/uL (ref 0.0–0.1)
Basophils Relative: 1 %
Eosinophils Absolute: 0.3 10*3/uL (ref 0.0–0.5)
Eosinophils Relative: 4 %
HCT: 39.5 % (ref 39.0–52.0)
Hemoglobin: 13 g/dL (ref 13.0–17.0)
Immature Granulocytes: 1 %
Lymphocytes Relative: 16 %
Lymphs Abs: 1.1 10*3/uL (ref 0.7–4.0)
MCH: 27.1 pg (ref 26.0–34.0)
MCHC: 32.9 g/dL (ref 30.0–36.0)
MCV: 82.5 fL (ref 80.0–100.0)
Monocytes Absolute: 0.9 10*3/uL (ref 0.1–1.0)
Monocytes Relative: 14 %
Neutro Abs: 4.3 10*3/uL (ref 1.7–7.7)
Neutrophils Relative %: 64 %
Platelets: 179 10*3/uL (ref 150–400)
RBC: 4.79 MIL/uL (ref 4.22–5.81)
RDW: 15.9 % — ABNORMAL HIGH (ref 11.5–15.5)
WBC: 6.7 10*3/uL (ref 4.0–10.5)
nRBC: 0 % (ref 0.0–0.2)

## 2021-12-05 LAB — COMPREHENSIVE METABOLIC PANEL
ALT: 17 U/L (ref 0–44)
AST: 16 U/L (ref 15–41)
Albumin: 4 g/dL (ref 3.5–5.0)
Alkaline Phosphatase: 66 U/L (ref 38–126)
Anion gap: 9 (ref 5–15)
BUN: 25 mg/dL — ABNORMAL HIGH (ref 8–23)
CO2: 28 mmol/L (ref 22–32)
Calcium: 8.5 mg/dL — ABNORMAL LOW (ref 8.9–10.3)
Chloride: 103 mmol/L (ref 98–111)
Creatinine, Ser: 1.11 mg/dL (ref 0.61–1.24)
GFR, Estimated: >60 mL/min (ref >60)
Glucose, Bld: 145 mg/dL — ABNORMAL HIGH (ref 70–99)
Potassium: 3.4 mmol/L — ABNORMAL LOW (ref 3.5–5.1)
Sodium: 140 mmol/L (ref 135–145)
Total Bilirubin: 1 mg/dL (ref 0.3–1.2)
Total Protein: 6.3 g/dL — ABNORMAL LOW (ref 6.5–8.1)

## 2021-12-05 LAB — MAGNESIUM: Magnesium: 1.7 mg/dL (ref 1.7–2.4)

## 2021-12-05 MED ORDER — DARATUMUMAB-HYALURONIDASE-FIHJ 1800-30000 MG-UT/15ML ~~LOC~~ SOLN
1800.0000 mg | Freq: Once | SUBCUTANEOUS | Status: AC
  Administered 2021-12-05: 1800 mg via SUBCUTANEOUS
  Filled 2021-12-05: qty 15

## 2021-12-05 MED ORDER — POTASSIUM CHLORIDE CRYS ER 20 MEQ PO TBCR
40.0000 meq | EXTENDED_RELEASE_TABLET | Freq: Once | ORAL | Status: AC
  Administered 2021-12-05: 40 meq via ORAL
  Filled 2021-12-05: qty 2

## 2021-12-05 MED ORDER — DEXAMETHASONE 4 MG PO TABS
40.0000 mg | ORAL_TABLET | Freq: Once | ORAL | Status: AC
  Administered 2021-12-05: 40 mg via ORAL
  Filled 2021-12-05: qty 10

## 2021-12-05 MED ORDER — DENOSUMAB 120 MG/1.7ML ~~LOC~~ SOLN
120.0000 mg | Freq: Once | SUBCUTANEOUS | Status: AC
  Administered 2021-12-05: 120 mg via SUBCUTANEOUS
  Filled 2021-12-05: qty 1.7

## 2021-12-05 MED ORDER — ACETAMINOPHEN 325 MG PO TABS
650.0000 mg | ORAL_TABLET | Freq: Once | ORAL | Status: AC
  Administered 2021-12-05: 650 mg via ORAL
  Filled 2021-12-05: qty 2

## 2021-12-05 MED ORDER — DIPHENHYDRAMINE HCL 25 MG PO CAPS
50.0000 mg | ORAL_CAPSULE | Freq: Once | ORAL | Status: AC
  Administered 2021-12-05: 50 mg via ORAL
  Filled 2021-12-05: qty 2

## 2021-12-05 NOTE — Progress Notes (Signed)
Patient presents today for Dara and Xgeva, BP 186/63, okay for treatment per Dr. Delton Coombes. Potassium 3.4, 40 mEq of Potassium Chloride ordered per standing orders.  Patient tolerated Daratumumab injection with no complaints voiced. See MAR for details. Lab reviewed. Injection site clean and dry with no bruising or swelling noted at site. Band aid applied. Vss with discharge and left in satisfactory condition with nos/s of distress noted.

## 2021-12-05 NOTE — Patient Instructions (Signed)
[facility]  Discharge Instructions: Thank you for choosing [facility] to provide your oncology and hematology care.  If you have a lab appointment with the [facility], please come in thru the Main Entrance and check in at the main information desk.  Wear comfortable clothing and clothing appropriate for easy access to any Portacath or PICC line.   We strive to give you quality time with your provider. You may need to reschedule your appointment if you arrive late (15 or more minutes).  Arriving late affects you and other patients whose appointments are after yours.  Also, if you miss three or more appointments without notifying the office, you may be dismissed from the clinic at the provider's discretion.      For prescription refill requests, have your pharmacy contact our office and allow 72 hours for refills to be completed.    Today you received the following chemotherapy and/or immunotherapy agents Daratumumab, Xgeva and Potassium return as scheduled.   To help prevent nausea and vomiting after your treatment, we encourage you to take your nausea medication as directed.  BELOW ARE SYMPTOMS THAT SHOULD BE REPORTED IMMEDIATELY: *FEVER GREATER THAN 100.4 F (38 C) OR HIGHER *CHILLS OR SWEATING *NAUSEA AND VOMITING THAT IS NOT CONTROLLED WITH YOUR NAUSEA MEDICATION *UNUSUAL SHORTNESS OF BREATH *UNUSUAL BRUISING OR BLEEDING *URINARY PROBLEMS (pain or burning when urinating, or frequent urination) *BOWEL PROBLEMS (unusual diarrhea, constipation, pain near the anus) TENDERNESS IN MOUTH AND THROAT WITH OR WITHOUT PRESENCE OF ULCERS (sore throat, sores in mouth, or a toothache) UNUSUAL RASH, SWELLING OR PAIN  UNUSUAL VAGINAL DISCHARGE OR ITCHING   Items with * indicate a potential emergency and should be followed up as soon as possible or go to the Emergency Department if any problems should occur.  Please show the CHEMOTHERAPY ALERT CARD or IMMUNOTHERAPY ALERT  CARD at check-in to the Emergency Department and triage nurse.  Should you have questions after your visit or need to cancel or reschedule your appointment, please contact [facility] [phone]  and follow the prompts.  Office hours are 8:00 a.m. to 4:30 p.m. Monday - Friday. Please note that voicemails left after 4:00 p.m. may not be returned until the following business day.  We are closed weekends and major holidays. You have access to a nurse at all times for urgent questions. Please call the main number to the clinic [phone] and follow the prompts.  For any non-urgent questions, you may also contact your provider using MyChart. We now offer e-Visits for anyone 68 and older to request care online for non-urgent symptoms. For details visit [email or web address].   Also download the MyChart app! Go to the app store, search "MyChart", open the app, select [facility], and log in with your MyChart username and password.  Due to Covid, a mask is required upon entering the hospital/clinic. If you do not have a mask, one will be given to you upon arrival. For doctor visits, patients may have 1 support person aged 66 or older with them. For treatment visits, patients cannot have anyone with them due to current Covid guidelines and our immunocompromised population.

## 2021-12-06 ENCOUNTER — Encounter (HOSPITAL_COMMUNITY): Payer: Self-pay

## 2021-12-12 ENCOUNTER — Other Ambulatory Visit (HOSPITAL_COMMUNITY): Payer: Self-pay

## 2021-12-12 MED ORDER — LENALIDOMIDE 10 MG PO CAPS: 10.0000 mg | ORAL_CAPSULE | Freq: Every day | ORAL | 0 refills | Status: DC

## 2021-12-12 NOTE — Telephone Encounter (Signed)
Chart reviewed. Revlimid refilled per last office note with Dr. Katragadda.  

## 2021-12-18 ENCOUNTER — Encounter (HOSPITAL_COMMUNITY): Payer: Self-pay

## 2021-12-18 ENCOUNTER — Other Ambulatory Visit (HOSPITAL_COMMUNITY): Payer: Self-pay

## 2021-12-26 ENCOUNTER — Encounter (HOSPITAL_COMMUNITY): Payer: Self-pay | Admitting: Hematology

## 2022-01-02 ENCOUNTER — Inpatient Hospital Stay (HOSPITAL_COMMUNITY): Payer: Medicare Other | Attending: Hematology

## 2022-01-02 ENCOUNTER — Inpatient Hospital Stay (HOSPITAL_COMMUNITY): Payer: Medicare Other

## 2022-01-02 ENCOUNTER — Encounter (HOSPITAL_COMMUNITY): Payer: Self-pay | Admitting: Hematology

## 2022-01-02 VITALS — BP 188/66 | HR 60 | Temp 97.7°F | Resp 18 | Wt 190.4 lb

## 2022-01-02 DIAGNOSIS — Z79899 Other long term (current) drug therapy: Secondary | ICD-10-CM | POA: Insufficient documentation

## 2022-01-02 DIAGNOSIS — C9000 Multiple myeloma not having achieved remission: Secondary | ICD-10-CM | POA: Insufficient documentation

## 2022-01-02 DIAGNOSIS — Z5112 Encounter for antineoplastic immunotherapy: Secondary | ICD-10-CM | POA: Insufficient documentation

## 2022-01-02 LAB — COMPREHENSIVE METABOLIC PANEL
ALT: 14 U/L (ref 0–44)
AST: 16 U/L (ref 15–41)
Albumin: 4 g/dL (ref 3.5–5.0)
Alkaline Phosphatase: 64 U/L (ref 38–126)
Anion gap: 7 (ref 5–15)
BUN: 30 mg/dL — ABNORMAL HIGH (ref 8–23)
CO2: 27 mmol/L (ref 22–32)
Calcium: 8.5 mg/dL — ABNORMAL LOW (ref 8.9–10.3)
Chloride: 103 mmol/L (ref 98–111)
Creatinine, Ser: 1.25 mg/dL — ABNORMAL HIGH (ref 0.61–1.24)
GFR, Estimated: >60 mL/min (ref >60)
Glucose, Bld: 139 mg/dL — ABNORMAL HIGH (ref 70–99)
Potassium: 3.6 mmol/L (ref 3.5–5.1)
Sodium: 137 mmol/L (ref 135–145)
Total Bilirubin: 0.8 mg/dL (ref 0.3–1.2)
Total Protein: 6.2 g/dL — ABNORMAL LOW (ref 6.5–8.1)

## 2022-01-02 LAB — CBC WITH DIFFERENTIAL/PLATELET
Abs Immature Granulocytes: 0.05 10*3/uL (ref 0.00–0.07)
Basophils Absolute: 0 10*3/uL (ref 0.0–0.1)
Basophils Relative: 1 %
Eosinophils Absolute: 0.2 10*3/uL (ref 0.0–0.5)
Eosinophils Relative: 3 %
HCT: 41 % (ref 39.0–52.0)
Hemoglobin: 13.3 g/dL (ref 13.0–17.0)
Immature Granulocytes: 1 %
Lymphocytes Relative: 17 %
Lymphs Abs: 1.1 10*3/uL (ref 0.7–4.0)
MCH: 26.8 pg (ref 26.0–34.0)
MCHC: 32.4 g/dL (ref 30.0–36.0)
MCV: 82.7 fL (ref 80.0–100.0)
Monocytes Absolute: 1 10*3/uL (ref 0.1–1.0)
Monocytes Relative: 15 %
Neutro Abs: 4.5 10*3/uL (ref 1.7–7.7)
Neutrophils Relative %: 63 %
Platelets: 194 10*3/uL (ref 150–400)
RBC: 4.96 MIL/uL (ref 4.22–5.81)
RDW: 15.9 % — ABNORMAL HIGH (ref 11.5–15.5)
WBC: 6.9 10*3/uL (ref 4.0–10.5)
nRBC: 0 % (ref 0.0–0.2)

## 2022-01-02 LAB — MAGNESIUM: Magnesium: 1.9 mg/dL (ref 1.7–2.4)

## 2022-01-02 LAB — LACTATE DEHYDROGENASE: LDH: 107 U/L (ref 98–192)

## 2022-01-02 MED ORDER — DIPHENHYDRAMINE HCL 25 MG PO CAPS
50.0000 mg | ORAL_CAPSULE | Freq: Once | ORAL | Status: AC
  Administered 2022-01-02: 50 mg via ORAL
  Filled 2022-01-02: qty 2

## 2022-01-02 MED ORDER — DEXAMETHASONE 4 MG PO TABS
40.0000 mg | ORAL_TABLET | Freq: Once | ORAL | Status: AC
  Administered 2022-01-02: 40 mg via ORAL
  Filled 2022-01-02: qty 10

## 2022-01-02 MED ORDER — ACETAMINOPHEN 325 MG PO TABS
650.0000 mg | ORAL_TABLET | Freq: Once | ORAL | Status: AC
  Administered 2022-01-02: 650 mg via ORAL
  Filled 2022-01-02: qty 2

## 2022-01-02 MED ORDER — DENOSUMAB 120 MG/1.7ML ~~LOC~~ SOLN
120.0000 mg | Freq: Once | SUBCUTANEOUS | Status: AC
  Administered 2022-01-02: 120 mg via SUBCUTANEOUS
  Filled 2022-01-02: qty 1.7

## 2022-01-02 MED ORDER — DARATUMUMAB-HYALURONIDASE-FIHJ 1800-30000 MG-UT/15ML ~~LOC~~ SOLN
1800.0000 mg | Freq: Once | SUBCUTANEOUS | Status: AC
  Administered 2022-01-02: 1800 mg via SUBCUTANEOUS
  Filled 2022-01-02: qty 15

## 2022-01-02 NOTE — Patient Instructions (Signed)
[facility]  Discharge Instructions: Thank you for choosing [facility] to provide your oncology and hematology care.  If you have a lab appointment with the [facility], please come in thru the Main Entrance and check in at the main information desk.  Wear comfortable clothing and clothing appropriate for easy access to any Portacath or PICC line.   We strive to give you quality time with your provider. You may need to reschedule your appointment if you arrive late (15 or more minutes).  Arriving late affects you and other patients whose appointments are after yours.  Also, if you miss three or more appointments without notifying the office, you may be dismissed from the clinic at the provider's discretion.      For prescription refill requests, have your pharmacy contact our office and allow 72 hours for refills to be completed.    Today you received the following chemotherapy and/or immunotherapy agents daratumumab      To help prevent nausea and vomiting after your treatment, we encourage you to take your nausea medication as directed.  BELOW ARE SYMPTOMS THAT SHOULD BE REPORTED IMMEDIATELY: *FEVER GREATER THAN 100.4 F (38 C) OR HIGHER *CHILLS OR SWEATING *NAUSEA AND VOMITING THAT IS NOT CONTROLLED WITH YOUR NAUSEA MEDICATION *UNUSUAL SHORTNESS OF BREATH *UNUSUAL BRUISING OR BLEEDING *URINARY PROBLEMS (pain or burning when urinating, or frequent urination) *BOWEL PROBLEMS (unusual diarrhea, constipation, pain near the anus) TENDERNESS IN MOUTH AND THROAT WITH OR WITHOUT PRESENCE OF ULCERS (sore throat, sores in mouth, or a toothache) UNUSUAL RASH, SWELLING OR PAIN  UNUSUAL VAGINAL DISCHARGE OR ITCHING   Items with * indicate a potential emergency and should be followed up as soon as possible or go to the Emergency Department if any problems should occur.  Please show the CHEMOTHERAPY ALERT CARD or IMMUNOTHERAPY ALERT CARD at check-in to the Emergency  Department and triage nurse.  Should you have questions after your visit or need to cancel or reschedule your appointment, please contact [facility] [phone]  and follow the prompts.  Office hours are 8:00 a.m. to 4:30 p.m. Monday - Friday. Please note that voicemails left after 4:00 p.m. may not be returned until the following business day.  We are closed weekends and major holidays. You have access to a nurse at all times for urgent questions. Please call the main number to the clinic [phone] and follow the prompts.  For any non-urgent questions, you may also contact your provider using MyChart. We now offer e-Visits for anyone 20 and older to request care online for non-urgent symptoms. For details visit [email or web address].   Also download the MyChart app! Go to the app store, search "MyChart", open the app, select [facility], and log in with your MyChart username and password.  Masks are optional in the cancer centers. If you would like for your care team to wear a mask while they are taking care of you, please let them know. For doctor visits, patients may have with them one support person who is at least 65 years old. At this time, visitors are not allowed in the infusion area.  Daratumumab injection What is this medication? DARATUMUMAB (dar a toom ue mab) is a monoclonal antibody. It is used to treat multiple myeloma. This medicine may be used for other purposes; ask your health care provider or pharmacist if you have questions. COMMON BRAND NAME(S): DARZALEX What should I tell my care team before I take this medication? They  need to know if you have any of these conditions: hereditary fructose intolerance infection (especially a virus infection such as chickenpox, herpes, or hepatitis B virus) lung or breathing disease (asthma, COPD) an unusual or allergic reaction to daratumumab, sorbitol, other medicines, foods, dyes, or preservatives pregnant or trying to  get pregnant breast-feeding How should I use this medication? This medicine is for infusion into a vein. It is given by a health care professional in a hospital or clinic setting. Talk to your pediatrician regarding the use of this medicine in children. Special care may be needed. Overdosage: If you think you have taken too much of this medicine contact a poison control center or emergency room at once. NOTE: This medicine is only for you. Do not share this medicine with others. What if I miss a dose? Keep appointments for follow-up doses as directed. It is important not to miss your dose. Call your doctor or health care professional if you are unable to keep an appointment. What may interact with this medication? Interactions have not been studied. This list may not describe all possible interactions. Give your health care provider a list of all the medicines, herbs, non-prescription drugs, or dietary supplements you use. Also tell them if you smoke, drink alcohol, or use illegal drugs. Some items may interact with your medicine. What should I watch for while using this medication? Your condition will be monitored carefully while you are receiving this medicine. This medicine can cause serious allergic reactions. To reduce your risk, your health care provider may give you other medicine to take before receiving this one. Be sure to follow the directions from your health care provider. This medicine can affect the results of blood tests to match your blood type. These changes can last for up to 6 months after the final dose. Your healthcare provider will do blood tests to match your blood type before you start treatment. Tell all of your healthcare providers that you are being treated with this medicine before receiving a blood transfusion. This medicine can affect the results of some tests used to determine treatment response; extra tests may be needed to evaluate response. Do not become pregnant  while taking this medicine or for 3 months after stopping it. Women should inform their health care provider if they wish to become pregnant or think they might be pregnant. There is a potential for serious side effects to an unborn child. Talk to your health care provider for more information. Do not breast-feed an infant while taking this medicine. What side effects may I notice from receiving this medication? Side effects that you should report to your care team as soon as possible: Allergic reactions--skin rash, itching or hives, swelling of the face, lips, or tongue Blurred vision Infection--fever, chills, cough, sore throat, pain or trouble passing urine Infusion reactions--dizziness, fast heartbeat, feeling faint or lightheaded, falls, headache, increase in blood pressure, nausea, vomiting, or wheezing or trouble breathing with loud or whistling sounds Unusual bleeding or bruising Side effects that usually do not require medical attention (report to your care team if they continue or are bothersome): Constipation Diarrhea Pain, tingling, numbness in the hands or feet Swelling of the ankles, feet, hands Tiredness This list may not describe all possible side effects. Call your doctor for medical advice about side effects. You may report side effects to FDA at 1-800-FDA-1088. Where should I keep my medication? This drug is given in a hospital or clinic and will not be stored at home.  NOTE: This sheet is a summary. It may not cover all possible information. If you have questions about this medicine, talk to your doctor, pharmacist, or health care provider.  2023 Elsevier/Gold Standard (2020-12-08 00:00:00)

## 2022-01-02 NOTE — Progress Notes (Signed)
Labs reviewed , they meet parameters for treatment today. Will proceed as planned.   Treatment given per orders. Patient tolerated it well without problems. Vitals stable and discharged home from clinic ambulatory. Follow up as scheduled.

## 2022-01-03 LAB — KAPPA/LAMBDA LIGHT CHAINS
Kappa free light chain: 12.5 mg/L (ref 3.3–19.4)
Kappa, lambda light chain ratio: 1.92 — ABNORMAL HIGH (ref 0.26–1.65)
Lambda free light chains: 6.5 mg/L (ref 5.7–26.3)

## 2022-01-04 ENCOUNTER — Other Ambulatory Visit (HOSPITAL_COMMUNITY): Payer: Self-pay | Admitting: Hematology

## 2022-01-04 ENCOUNTER — Encounter (HOSPITAL_COMMUNITY): Payer: Self-pay

## 2022-01-04 LAB — PROTEIN ELECTROPHORESIS, SERUM
A/G Ratio: 1.6 (ref 0.7–1.7)
Albumin ELP: 3.4 g/dL (ref 2.9–4.4)
Alpha-1-Globulin: 0.2 g/dL (ref 0.0–0.4)
Alpha-2-Globulin: 0.7 g/dL (ref 0.4–1.0)
Beta Globulin: 0.8 g/dL (ref 0.7–1.3)
Gamma Globulin: 0.3 g/dL — ABNORMAL LOW (ref 0.4–1.8)
Globulin, Total: 2.1 g/dL — ABNORMAL LOW (ref 2.2–3.9)
Total Protein ELP: 5.5 g/dL — ABNORMAL LOW (ref 6.0–8.5)

## 2022-01-09 LAB — IMMUNOFIXATION ELECTROPHORESIS
IgA: 28 mg/dL — ABNORMAL LOW (ref 61–437)
IgG (Immunoglobin G), Serum: 333 mg/dL — ABNORMAL LOW (ref 603–1613)
IgM (Immunoglobulin M), Srm: 8 mg/dL — ABNORMAL LOW (ref 20–172)
Total Protein ELP: 5.7 g/dL — ABNORMAL LOW (ref 6.0–8.5)

## 2022-01-10 ENCOUNTER — Other Ambulatory Visit (HOSPITAL_COMMUNITY): Payer: Self-pay

## 2022-01-10 MED ORDER — LENALIDOMIDE 10 MG PO CAPS: 10.0000 mg | ORAL_CAPSULE | Freq: Every day | ORAL | 0 refills | Status: DC

## 2022-01-10 NOTE — Telephone Encounter (Signed)
Chart reviewed. Revlimid refilled per last office note with Dr. Katragadda.  

## 2022-01-29 ENCOUNTER — Other Ambulatory Visit (HOSPITAL_COMMUNITY): Payer: Self-pay

## 2022-01-30 ENCOUNTER — Inpatient Hospital Stay (HOSPITAL_COMMUNITY): Payer: Medicare Other | Attending: Hematology | Admitting: Hematology

## 2022-01-30 ENCOUNTER — Inpatient Hospital Stay (HOSPITAL_COMMUNITY): Payer: Medicare Other

## 2022-01-30 VITALS — BP 194/68 | HR 52 | Temp 98.2°F | Resp 18 | Ht 70.0 in | Wt 188.7 lb

## 2022-01-30 DIAGNOSIS — Z5112 Encounter for antineoplastic immunotherapy: Secondary | ICD-10-CM | POA: Insufficient documentation

## 2022-01-30 DIAGNOSIS — Z79899 Other long term (current) drug therapy: Secondary | ICD-10-CM | POA: Diagnosis not present

## 2022-01-30 DIAGNOSIS — C9000 Multiple myeloma not having achieved remission: Secondary | ICD-10-CM | POA: Diagnosis present

## 2022-01-30 LAB — CBC WITH DIFFERENTIAL/PLATELET
Abs Immature Granulocytes: 0.03 10*3/uL (ref 0.00–0.07)
Basophils Absolute: 0.1 10*3/uL (ref 0.0–0.1)
Basophils Relative: 1 %
Eosinophils Absolute: 0.1 10*3/uL (ref 0.0–0.5)
Eosinophils Relative: 2 %
HCT: 39.7 % (ref 39.0–52.0)
Hemoglobin: 12.9 g/dL — ABNORMAL LOW (ref 13.0–17.0)
Immature Granulocytes: 0 %
Lymphocytes Relative: 14 %
Lymphs Abs: 1 10*3/uL (ref 0.7–4.0)
MCH: 27 pg (ref 26.0–34.0)
MCHC: 32.5 g/dL (ref 30.0–36.0)
MCV: 83.1 fL (ref 80.0–100.0)
Monocytes Absolute: 0.9 10*3/uL (ref 0.1–1.0)
Monocytes Relative: 14 %
Neutro Abs: 4.6 10*3/uL (ref 1.7–7.7)
Neutrophils Relative %: 69 %
Platelets: 180 10*3/uL (ref 150–400)
RBC: 4.78 MIL/uL (ref 4.22–5.81)
RDW: 16 % — ABNORMAL HIGH (ref 11.5–15.5)
WBC: 6.8 10*3/uL (ref 4.0–10.5)
nRBC: 0 % (ref 0.0–0.2)

## 2022-01-30 LAB — MAGNESIUM: Magnesium: 2 mg/dL (ref 1.7–2.4)

## 2022-01-30 LAB — COMPREHENSIVE METABOLIC PANEL
ALT: 18 U/L (ref 0–44)
AST: 18 U/L (ref 15–41)
Albumin: 4 g/dL (ref 3.5–5.0)
Alkaline Phosphatase: 62 U/L (ref 38–126)
Anion gap: 7 (ref 5–15)
BUN: 26 mg/dL — ABNORMAL HIGH (ref 8–23)
CO2: 29 mmol/L (ref 22–32)
Calcium: 8.5 mg/dL — ABNORMAL LOW (ref 8.9–10.3)
Chloride: 103 mmol/L (ref 98–111)
Creatinine, Ser: 1.16 mg/dL (ref 0.61–1.24)
GFR, Estimated: >60 mL/min (ref >60)
Glucose, Bld: 151 mg/dL — ABNORMAL HIGH (ref 70–99)
Potassium: 3.9 mmol/L (ref 3.5–5.1)
Sodium: 139 mmol/L (ref 135–145)
Total Bilirubin: 1.3 mg/dL — ABNORMAL HIGH (ref 0.3–1.2)
Total Protein: 6.3 g/dL — ABNORMAL LOW (ref 6.5–8.1)

## 2022-01-30 LAB — LACTATE DEHYDROGENASE: LDH: 113 U/L (ref 98–192)

## 2022-01-30 MED ORDER — DEXAMETHASONE 4 MG PO TABS
40.0000 mg | ORAL_TABLET | Freq: Once | ORAL | Status: AC
  Administered 2022-01-30: 40 mg via ORAL
  Filled 2022-01-30: qty 10

## 2022-01-30 MED ORDER — ACETAMINOPHEN 325 MG PO TABS
650.0000 mg | ORAL_TABLET | Freq: Once | ORAL | Status: AC
  Administered 2022-01-30: 650 mg via ORAL
  Filled 2022-01-30: qty 2

## 2022-01-30 MED ORDER — DENOSUMAB 120 MG/1.7ML ~~LOC~~ SOLN
120.0000 mg | Freq: Once | SUBCUTANEOUS | Status: AC
  Administered 2022-01-30: 120 mg via SUBCUTANEOUS
  Filled 2022-01-30: qty 1.7

## 2022-01-30 MED ORDER — DARATUMUMAB-HYALURONIDASE-FIHJ 1800-30000 MG-UT/15ML ~~LOC~~ SOLN
1800.0000 mg | Freq: Once | SUBCUTANEOUS | Status: AC
  Administered 2022-01-30: 1800 mg via SUBCUTANEOUS
  Filled 2022-01-30: qty 15

## 2022-01-30 MED ORDER — DIPHENHYDRAMINE HCL 25 MG PO CAPS
50.0000 mg | ORAL_CAPSULE | Freq: Once | ORAL | Status: AC
  Administered 2022-01-30: 50 mg via ORAL
  Filled 2022-01-30: qty 2

## 2022-01-30 NOTE — Patient Instructions (Signed)
[facility]  Discharge Instructions: Thank you for choosing [facility] to provide your oncology and hematology care.  If you have a lab appointment with the [facility], please come in thru the Main Entrance and check in at the main information desk.  Wear comfortable clothing and clothing appropriate for easy access to any Portacath or PICC line.   We strive to give you quality time with your provider. You may need to reschedule your appointment if you arrive late (15 or more minutes).  Arriving late affects you and other patients whose appointments are after yours.  Also, if you miss three or more appointments without notifying the office, you may be dismissed from the clinic at the provider's discretion.      For prescription refill requests, have your pharmacy contact our office and allow 72 hours for refills to be completed.    Today you received the following chemotherapy and/or immunotherapy agents Darzalex Faspro.  Daratumumab; Hyaluronidase Injection What is this medication? DARATUMUMAB; HYALURONIDASE (dar a toom ue mab / hye al ur ON i dase) is a monoclonal antibody. Hyaluronidase is used to improve the effects of daratumumab. It treats certain types of cancer. Some of the cancers treated are multiple myeloma and light-chain amyloidosis. This medicine may be used for other purposes; ask your health care provider or pharmacist if you have questions. COMMON BRAND NAME(S): DARZALEX FASPRO What should I tell my care team before I take this medication? They need to know if you have any of these conditions: heart disease infection especially a viral infection such as chickenpox, cold sores, herpes, or hepatitis B lung or breathing disease an unusual or allergic reaction to daratumumab, hyaluronidase, other medicines, foods, dyes, or preservatives pregnant or trying to get pregnant breast-feeding How should I use this medication? This medicine is for  injection under the skin. It is given by a health care professional in a hospital or clinic setting. Talk to your pediatrician regarding the use of this medicine in children. Special care may be needed. Overdosage: If you think you have taken too much of this medicine contact a poison control center or emergency room at once. NOTE: This medicine is only for you. Do not share this medicine with others. What if I miss a dose? Keep appointments for follow-up doses as directed. It is important not to miss your dose. Call your doctor or health care professional if you are unable to keep an appointment. What may interact with this medication? Interactions have not been studied. This list may not describe all possible interactions. Give your health care provider a list of all the medicines, herbs, non-prescription drugs, or dietary supplements you use. Also tell them if you smoke, drink alcohol, or use illegal drugs. Some items may interact with your medicine. What should I watch for while using this medication? Your condition will be monitored carefully while you are receiving this medicine. This medicine can cause serious allergic reactions. To reduce your risk, your health care provider may give you other medicine to take before receiving this one. Be sure to follow the directions from your health care provider. This medicine can affect the results of blood tests to match your blood type. These changes can last for up to 6 months after the final dose. Your healthcare provider will do blood tests to match your blood type before you start treatment. Tell all of your healthcare providers that you are being treated with this medicine before receiving a blood  transfusion. This medicine can affect the results of some tests used to determine treatment response; extra tests may be needed to evaluate response. Do not become pregnant while taking this medicine or for 3 months after stopping it. Women should inform  their health care provider if they wish to become pregnant or think they might be pregnant. There is a potential for serious side effects to an unborn child. Talk to your health care provider for more information. Do not breast-feed an infant while taking this medicine. What side effects may I notice from receiving this medication? Side effects that you should report to your care team as soon as possible: Allergic reactions--skin rash, itching or hives, swelling of the face, lips, or tongue Blood clot--chest pain, shortness of breath, pain, swelling or warmth in the leg Blurred vision Fast, irregular heartbeat Infection--fever, chills, cough, sore throat, pain or trouble passing urine Injection reactions--dizziness, fast heartbeat, feeling faint or lightheaded, falls, headache, increase in blood pressure, nausea, vomiting, or wheezing or trouble breathing with loud or whistling sounds Low red blood cell counts--trouble breathing, feeling faint, lightheaded or falls, unusually weak or tired Unusual bleeding or bruising Side effects that usually do not require medical attention (report these to your care team if they continue or are bothersome): Back pain Constipation Diarrhea Pain, tingling, numbness in the hands or feet Pain, redness, or irritation at site where injected Muscle cramp or pain Swelling of the ankles, feet, hands Tiredness Trouble sleeping This list may not describe all possible side effects. Call your doctor for medical advice about side effects. You may report side effects to FDA at 1-800-FDA-1088. Where should I keep my medication? This drug is given in a hospital or clinic and will not be stored at home. NOTE: This sheet is a summary. It may not cover all possible information. If you have questions about this medicine, talk to your doctor, pharmacist, or health care provider.  2023 Elsevier/Gold Standard (2021-06-02 00:00:00)       To help prevent nausea and vomiting  after your treatment, we encourage you to take your nausea medication as directed.  BELOW ARE SYMPTOMS THAT SHOULD BE REPORTED IMMEDIATELY: *FEVER GREATER THAN 100.4 F (38 C) OR HIGHER *CHILLS OR SWEATING *NAUSEA AND VOMITING THAT IS NOT CONTROLLED WITH YOUR NAUSEA MEDICATION *UNUSUAL SHORTNESS OF BREATH *UNUSUAL BRUISING OR BLEEDING *URINARY PROBLEMS (pain or burning when urinating, or frequent urination) *BOWEL PROBLEMS (unusual diarrhea, constipation, pain near the anus) TENDERNESS IN MOUTH AND THROAT WITH OR WITHOUT PRESENCE OF ULCERS (sore throat, sores in mouth, or a toothache) UNUSUAL RASH, SWELLING OR PAIN  UNUSUAL VAGINAL DISCHARGE OR ITCHING   Items with * indicate a potential emergency and should be followed up as soon as possible or go to the Emergency Department if any problems should occur.  Please show the CHEMOTHERAPY ALERT CARD or IMMUNOTHERAPY ALERT CARD at check-in to the Emergency Department and triage nurse.  Should you have questions after your visit or need to cancel or reschedule your appointment, please contact [facility] [phone]  and follow the prompts.  Office hours are 8:00 a.m. to 4:30 p.m. Monday - Friday. Please note that voicemails left after 4:00 p.m. may not be returned until the following business day.  We are closed weekends and major holidays. You have access to a nurse at all times for urgent questions. Please call the main number to the clinic [phone] and follow the prompts.  For any non-urgent questions, you may also contact your  provider using MyChart. We now offer e-Visits for anyone 13 and older to request care online for non-urgent symptoms. For details visit [email or web address].   Also download the MyChart app! Go to the app store, search "MyChart", open the app, select [facility], and log in with your MyChart username and password.  Masks are optional in the cancer centers. If you would like for your care team to wear  a mask while they are taking care of you, please let them know. For doctor visits, patients may have with them one support person who is at least 65 years old. At this time, visitors are not allowed in the infusion area.

## 2022-01-30 NOTE — Progress Notes (Signed)
Patient presents today for treatment and follow up with Dr. Delton Coombes. Blood pressure elevated on arrival. Labs within parameters for treatment today. Blood pressure reviewed by MD and no new orders received. Message received from Sylvester Harder RN / Dr. Delton Coombes to proceed with treatment with no recheck on blood pressure required. Patient denies any jaw pain or upcoming dental work. Patient taking Vitamin D with Calcium supplements as prescribed. Calcium 8.5 today.   Treatment given today per MD orders. Tolerated without adverse affects. Vital signs stable. No complaints at this time. Discharged from clinic ambulatory in stable condition. Alert and oriented x 3. F/U with [facility] as scheduled.

## 2022-01-30 NOTE — Patient Instructions (Signed)
[facility] at [facility] Discharge Instructions  You were seen and examined today by Dr. Delton Coombes.  Dr. Delton Coombes discussed your most recent lab work which is good. Continue taking your Revlimid as prescribed. You will continue getting monthly Xgeva and Dara (injections given in the [facility]).  Follow-up as scheduled.  Thank you for choosing [facility] at [facility] to provide your oncology and hematology care.  To afford each patient quality time with our provider, please arrive at least 15 minutes before your scheduled appointment time.   If you have a lab appointment with the [facility] please come in thru the Main Entrance and check in at the main information desk.  You need to re-schedule your appointment should you arrive 10 or more minutes late.  We strive to give you quality time with our providers, and arriving late affects you and other patients whose appointments are after yours.  Also, if you no show three or more times for appointments you may be dismissed from the clinic at the providers discretion.     Again, thank you for choosing [facility].  Our hope is that these requests will decrease the amount of time that you wait before being seen by our physicians.       _____________________________________________________________  Should you have questions after your visit to [facility], please contact our office at [phone] and follow the prompts.  Our office hours are 8:00 a.m. and 4:30 p.m. Monday - Friday.  Please note that voicemails left after 4:00 p.m. may not be returned until the following business day.  We are closed weekends and major holidays.  You do have access to a nurse 24-7, just call the main number to the clinic [phone] and do not press any options, hold on the line and a nurse will answer the phone.    For prescription refill requests, have your pharmacy contact our  office and allow 72 hours.

## 2022-01-30 NOTE — Progress Notes (Signed)
Paul Norton, [place] [id number]   CLINIC:  Medical Oncology/Hematology  PCP:  Paul Hurl, PA-C [address] / [place] [address] [phone]   REASON FOR VISIT:  Follow-up for multiple myeloma  PRIOR THERAPY: none  NGS Results: not done  CURRENT THERAPY: DaraBorD every 3 weeks; Revlimid 10 mg 3/2 weeks raBorD every 3 weeks; Revlimid 10 mg 3/2 weeks  BRIEF ONCOLOGIC HISTORY:  Oncology History  Multiple myeloma not having achieved remission ([facility])  11/01/2020 Initial Diagnosis   Multiple myeloma not having achieved remission ([facility])   11/02/2020 - 11/02/2020 Chemotherapy         11/21/2020 -  Chemotherapy   Patient is on Treatment Plan : MYELOMA NEWLY DIAGNOSED TRANSPLANT CANDIDATE DaraVRd (Daratumumab SQ) q21d x 6 Cycles (Induction/Consolidation)       CANCER STAGING:  Cancer Staging  No matching staging information was found for the patient.  INTERVAL HISTORY:  Paul Norton, a 66 y.o. male, returns for routine follow-up and consideration for next cycle of chemotherapy. Wise was last seen on 11/07/2021.  Due for cycle #17 of Darzalex Faspro today.   Overall, he tells me he has been feeling pretty well. He denies diarrhea, constipation, and infections. He continues to take Revlimid and Calcium.   Overall, he feels ready for next cycle of chemo today.    REVIEW OF SYSTEMS:  Review of Systems  Constitutional:  Negative for appetite change and fatigue.  Gastrointestinal:  Negative for constipation and diarrhea.  All other systems reviewed and are negative.   PAST MEDICAL/SURGICAL HISTORY:  Past Medical History:  Diagnosis Date   Broken leg    Colonoscopy refused 09/2017   Hyperlipidemia    Hypertension    Multiple myeloma ([facility])    Pneumonia    Refuses treatment 09/2017   refuses screenings such as cancer screening, refuses vaccines, refuses normal preventative care   Vaccine refused by patient     all vaccines as of 09/2017   Past Surgical History:  Procedure Laterality Date   COLONOSCOPY     never, declines as of 09/2017    SOCIAL HISTORY:  Social History   Socioeconomic History   Marital status: Divorced    Spouse name: Not on file   Number of children: 1   Years of education: Not on file   Highest education level: Not on file  Occupational History   Occupation: Disability  Tobacco Use   Smoking status: Never   Smokeless tobacco: Never  Substance and Sexual Activity   Alcohol use: No   Drug use: Never   Sexual activity: Not Currently  Other Topics Concern   Not on file  Social History Narrative   Not on file   Social Determinants of Health   Financial Resource Strain: Medium Risk (11/15/2020)   Overall Financial Resource Strain (CARDIA)    Difficulty of Paying Living Expenses: Somewhat hard  Food Insecurity: No Food Insecurity (11/15/2020)   Hunger Vital Sign    Worried About Running Out of Food in the Last Year: Never true    [place] in the Last Year: Never true  Transportation Needs: No Transportation Needs (11/15/2020)   PRAPARE - Hydrologist (Medical): No    Lack of Transportation (Non-Medical): No  Physical Activity: Insufficiently Active (11/21/2020)   Exercise Vital Sign    Days of Exercise per Week: 5 days    Minutes of Exercise per Session:  20 min  Stress: No Stress Concern Present (11/21/2020)   [place]    Feeling of Stress : Not at all  Social Connections: Socially Isolated (11/21/2020)   Social Connection and Isolation Panel [NHANES]    Frequency of Communication with Friends and Family: More than three times a week    Frequency of Social Gatherings with Friends and Family: More than three times a week    Attends Religious Services: Never    Marine scientist or Organizations: No    Attends Archivist Meetings: Never    Marital  Status: Divorced  Human resources officer Violence: Not At Risk (11/21/2020)   Humiliation, Afraid, Rape, and Kick questionnaire    Fear of Current or Ex-Partner: No    Emotionally Abused: No    Physically Abused: No    Sexually Abused: No    FAMILY HISTORY:  No family history on file.  CURRENT MEDICATIONS:  Current Outpatient Medications  Medication Sig Dispense Refill   acetaminophen (TYLENOL) 325 MG tablet Take 2 tablets (650 mg total) by mouth every 6 (six) hours as needed for mild pain (or Fever >/= 101). 30 tablet 30   amLODipine (NORVASC) 10 MG tablet TAKE ONE TABLET BY MOUTH ONCE DAILY 30 tablet 3   aspirin 81 MG chewable tablet Chew by mouth daily.     calcium-vitamin D (OSCAL WITH D) 500-5 MG-MCG tablet Take 1 tablet by mouth daily. 30 tablet 3   feeding supplement (ENSURE ENLIVE / ENSURE PLUS) LIQD Take 237 mLs by mouth 3 (three) times daily between meals. 237 mL 12   lenalidomide (REVLIMID) 10 MG capsule Take 1 capsule (10 mg total) by mouth daily. 21 days on, 7 days off 21 capsule 0   No current facility-administered medications for this visit.    ALLERGIES:  No Known Allergies  PHYSICAL EXAM:  Performance status (ECOG): 1 - Symptomatic but completely ambulatory  There were no vitals filed for this visit. Wt Readings from Last 3 Encounters:  01/02/22 190 lb 6.4 oz (86.4 kg)  12/05/21 187 lb (84.8 kg)  11/07/21 188 lb 14.4 oz (85.7 kg)   Physical Exam Vitals reviewed.  Constitutional:      Appearance: Normal appearance.  Cardiovascular:     Rate and Rhythm: Normal rate and regular rhythm.     Pulses: Normal pulses.     Heart sounds: Normal heart sounds.  Pulmonary:     Effort: Pulmonary effort is normal.     Breath sounds: Normal breath sounds.  Neurological:     General: No focal deficit present.     Mental Status: He is alert and oriented to person, place, and time.  Psychiatric:        Mood and Affect: Mood normal.        Behavior: Behavior normal.      LABORATORY DATA:  I have reviewed the labs as listed.     Latest Ref Rng & Units 01/30/2022   12:30 PM 01/02/2022    9:31 AM 12/05/2021   10:05 AM  CBC  WBC 4.0 - 10.5 K/uL 6.8  6.9  6.7   Hemoglobin 13.0 - 17.0 g/dL 12.9  13.3  13.0   Hematocrit 39.0 - 52.0 % 39.7  41.0  39.5   Platelets 150 - 400 K/uL 180  194  179       Latest Ref Rng & Units 01/02/2022    9:31 AM 12/05/2021   10:05 AM  11/07/2021   12:10 PM  CMP  Glucose 70 - 99 mg/dL 139  145  124   BUN 8 - 23 mg/dL _0 Creatinine 0.61 - 1.24 mg/dL 1.25  1.11  1.08   Sodium 135 - 145 mmol/L 137  140  140   Potassium 3.5 - 5.1 mmol/L 3.6  3.4  3.8   Chloride 98 - 111 mmol/L 103  103  103   CO2 22 - 32 mmol/L _1 Calcium 8.9 - 10.3 mg/dL 8.5  8.5  8.7   Total Protein 6.5 - 8.1 g/dL 6.2  6.3  6.6   Total Bilirubin 0.3 - 1.2 mg/dL 0.8  1.0  1.2   Alkaline Phos 38 - 126 U/L 64  66  68   AST 15 - 41 U/L _2 ALT 0 - 44 U/L _3 DIAGNOSTIC IMAGING:  I have independently reviewed the scans and discussed with the patient. No results found.   ASSESSMENT:  1.  IgG kappa light chain multiple myeloma: - Admission with hypercalcemia and renal failure. - CT CAP showed extensive lytic foci throughout the axial and appendicular skeleton of the chest, abdomen and pelvis.  Splenomegaly with an enlarged lobular spleen, nonspecific. - Bone marrow biopsy on 11/01/2020 shows 85% atypical plasma cells in the aspirate. - FISH panel positive for 1 p-,-13,14q-,16q-,17p-/-17 and 20q- - Cytogenetics-no metaphases available for analysis. - SPEP with 5.4 g of M spike.  Free light chain ratio 1030.  LDH normal.  Beta-2 microglobulin 18.7.  24-hour urine total protein 3.6 g. - 6 cycles of Dara VRD from 11/21/2020 through 03/07/2021. - Patient declined bone marrow transplant. - Maintenance Revlimid and monthly daratumumab started on 03/27/2021.   2.  Social/family history: - He worked as a Administrator.  He lives  by himself. - Mother with breast and ovarian cancer.  Father had metastatic kidney cancer.  His brother's daughter has CML.   PLAN:  1.  IgG kappa light chain multiple myeloma, high risk: - I have reviewed myeloma labs from 01/02/2022 with M spike not observed.  Free light chain ratio is 1.92 and stable.  Kappa light chains at 12.5.  Immunofixation is normal. - Labs from today shows creatinine 1.16 and rest of LFTs are normal.  CBC was grossly normal. - Continue Revlimid 10 mg 3 weeks on/1 week off. - Continue Darzalex monthly as maintenance injection. - RTC 3 months for follow-up with repeat labs.   2.  Myeloma bone disease: - Continue denosumab monthly. - Continue calcium and vitamin D supplements.  Calcium is normal.  3.  ID prophylaxis: - Continue acyclovir twice daily. - Continue aspirin 81 mg daily for thromboprophylaxis.   Orders placed this encounter:  No orders of the defined types were placed in this encounter.    Derek Jack, MD [facility] [phone]   I, Thana Ates, am acting as a scribe for Dr. Derek Jack.  I, Derek Jack MD, have reviewed the above documentation for accuracy and completeness, and I agree with the above.

## 2022-01-30 NOTE — Progress Notes (Signed)
Patient has been assessed, vital signs and labs have been reviewed by Dr. Delton Coombes. ANC, Creatinine, LFTs, and Platelets are within treatment parameters per Dr. Delton Coombes. Dr. Delton Coombes has reviewed patient's vital signs, including BP, no actions necessary and no requirement for recheck at this time per Dr. Delton Coombes. Patient to continue monitoring BP at home. The patient is good to proceed with treatment at this time. Primary RN and pharmacy aware.

## 2022-02-01 LAB — PROTEIN ELECTROPHORESIS, SERUM
A/G Ratio: 1.6 (ref 0.7–1.7)
Albumin ELP: 3.4 g/dL (ref 2.9–4.4)
Alpha-1-Globulin: 0.3 g/dL (ref 0.0–0.4)
Alpha-2-Globulin: 0.7 g/dL (ref 0.4–1.0)
Beta Globulin: 0.9 g/dL (ref 0.7–1.3)
Gamma Globulin: 0.3 g/dL — ABNORMAL LOW (ref 0.4–1.8)
Globulin, Total: 2.1 g/dL — ABNORMAL LOW (ref 2.2–3.9)
Total Protein ELP: 5.5 g/dL — ABNORMAL LOW (ref 6.0–8.5)

## 2022-02-05 ENCOUNTER — Other Ambulatory Visit: Payer: Self-pay

## 2022-02-05 LAB — IMMUNOFIXATION ELECTROPHORESIS
IgA: 27 mg/dL — ABNORMAL LOW (ref 61–437)
IgG (Immunoglobin G), Serum: 361 mg/dL — ABNORMAL LOW (ref 603–1613)
IgM (Immunoglobulin M), Srm: 7 mg/dL — ABNORMAL LOW (ref 20–172)
Total Protein ELP: 5.6 g/dL — ABNORMAL LOW (ref 6.0–8.5)

## 2022-02-06 ENCOUNTER — Other Ambulatory Visit (HOSPITAL_COMMUNITY): Payer: Self-pay

## 2022-02-06 MED ORDER — LENALIDOMIDE 10 MG PO CAPS: 10.0000 mg | ORAL_CAPSULE | Freq: Every day | ORAL | 0 refills | Status: DC

## 2022-02-06 NOTE — Telephone Encounter (Signed)
Chart reviewed. Revlimid refilled per last office note with Dr. Katragadda.  

## 2022-02-08 ENCOUNTER — Other Ambulatory Visit: Payer: Self-pay

## 2022-02-13 ENCOUNTER — Other Ambulatory Visit: Payer: Self-pay

## 2022-02-14 ENCOUNTER — Other Ambulatory Visit: Payer: Self-pay

## 2022-02-16 ENCOUNTER — Other Ambulatory Visit: Payer: Self-pay

## 2022-02-20 ENCOUNTER — Encounter: Payer: Self-pay | Admitting: Hematology

## 2022-02-20 ENCOUNTER — Encounter (HOSPITAL_COMMUNITY): Payer: Self-pay | Admitting: Hematology

## 2022-02-27 ENCOUNTER — Inpatient Hospital Stay: Payer: Medicare Other | Attending: Hematology

## 2022-02-27 ENCOUNTER — Inpatient Hospital Stay: Payer: Medicare Other

## 2022-02-27 VITALS — BP 173/76 | HR 67 | Temp 97.0°F | Resp 18 | Wt 192.0 lb

## 2022-02-27 DIAGNOSIS — Z79899 Other long term (current) drug therapy: Secondary | ICD-10-CM | POA: Insufficient documentation

## 2022-02-27 DIAGNOSIS — C9000 Multiple myeloma not having achieved remission: Secondary | ICD-10-CM

## 2022-02-27 DIAGNOSIS — Z5111 Encounter for antineoplastic chemotherapy: Secondary | ICD-10-CM | POA: Insufficient documentation

## 2022-02-27 LAB — COMPREHENSIVE METABOLIC PANEL
ALT: 18 U/L (ref 0–44)
AST: 20 U/L (ref 15–41)
Albumin: 4 g/dL (ref 3.5–5.0)
Alkaline Phosphatase: 63 U/L (ref 38–126)
Anion gap: 8 (ref 5–15)
BUN: 29 mg/dL — ABNORMAL HIGH (ref 8–23)
CO2: 28 mmol/L (ref 22–32)
Calcium: 8.8 mg/dL — ABNORMAL LOW (ref 8.9–10.3)
Chloride: 103 mmol/L (ref 98–111)
Creatinine, Ser: 1.31 mg/dL — ABNORMAL HIGH (ref 0.61–1.24)
GFR, Estimated: >60 mL/min (ref >60)
Glucose, Bld: 130 mg/dL — ABNORMAL HIGH (ref 70–99)
Potassium: 3.9 mmol/L (ref 3.5–5.1)
Sodium: 139 mmol/L (ref 135–145)
Total Bilirubin: 1.1 mg/dL (ref 0.3–1.2)
Total Protein: 6.5 g/dL (ref 6.5–8.1)

## 2022-02-27 LAB — CBC WITH DIFFERENTIAL/PLATELET
Abs Immature Granulocytes: 0.05 10*3/uL (ref 0.00–0.07)
Basophils Absolute: 0.1 10*3/uL (ref 0.0–0.1)
Basophils Relative: 1 %
Eosinophils Absolute: 0.2 10*3/uL (ref 0.0–0.5)
Eosinophils Relative: 3 %
HCT: 42.4 % (ref 39.0–52.0)
Hemoglobin: 13.8 g/dL (ref 13.0–17.0)
Immature Granulocytes: 1 %
Lymphocytes Relative: 17 %
Lymphs Abs: 1.2 10*3/uL (ref 0.7–4.0)
MCH: 27 pg (ref 26.0–34.0)
MCHC: 32.5 g/dL (ref 30.0–36.0)
MCV: 83 fL (ref 80.0–100.0)
Monocytes Absolute: 1.1 10*3/uL — ABNORMAL HIGH (ref 0.1–1.0)
Monocytes Relative: 15 %
Neutro Abs: 4.3 10*3/uL (ref 1.7–7.7)
Neutrophils Relative %: 63 %
Platelets: 172 10*3/uL (ref 150–400)
RBC: 5.11 MIL/uL (ref 4.22–5.81)
RDW: 16.2 % — ABNORMAL HIGH (ref 11.5–15.5)
WBC: 7 10*3/uL (ref 4.0–10.5)
nRBC: 0 % (ref 0.0–0.2)

## 2022-02-27 MED ORDER — ACETAMINOPHEN 325 MG PO TABS
650.0000 mg | ORAL_TABLET | Freq: Once | ORAL | Status: AC
  Administered 2022-02-27: 650 mg via ORAL
  Filled 2022-02-27: qty 2

## 2022-02-27 MED ORDER — DARATUMUMAB-HYALURONIDASE-FIHJ 1800-30000 MG-UT/15ML ~~LOC~~ SOLN
1800.0000 mg | Freq: Once | SUBCUTANEOUS | Status: AC
  Administered 2022-02-27: 1800 mg via SUBCUTANEOUS
  Filled 2022-02-27: qty 15

## 2022-02-27 MED ORDER — DEXAMETHASONE 4 MG PO TABS
40.0000 mg | ORAL_TABLET | Freq: Once | ORAL | Status: AC
  Administered 2022-02-27: 40 mg via ORAL
  Filled 2022-02-27: qty 10

## 2022-02-27 MED ORDER — DENOSUMAB 120 MG/1.7ML ~~LOC~~ SOLN
120.0000 mg | Freq: Once | SUBCUTANEOUS | Status: AC
  Administered 2022-02-27: 120 mg via SUBCUTANEOUS
  Filled 2022-02-27: qty 1.7

## 2022-02-27 MED ORDER — DIPHENHYDRAMINE HCL 25 MG PO CAPS
50.0000 mg | ORAL_CAPSULE | Freq: Once | ORAL | Status: AC
  Administered 2022-02-27: 50 mg via ORAL
  Filled 2022-02-27: qty 2

## 2022-02-27 NOTE — Progress Notes (Signed)
Patient taking calcium as directed. Denied tooth, jaw, and leg pain. No recent or upcoming dental visits. Labs reviewed. Patient tolerated injection with no complaints voiced. See MAR for details. Patient stable during and after injection. Site clean and dry with no bruising or swelling noted. Band aid applied.  Patient tolerated Daratumumab injection with no complaints voiced. See MAR for details. Lab reviewed. Injection site clean and dry with no bruising or swelling noted at site. Band aid applied. Vss with discharge and left in satisfactory condition with nos/s of distress noted.  

## 2022-02-27 NOTE — Patient Instructions (Signed)
[facility]  Discharge Instructions: Thank you for choosing [facility] to provide your oncology and hematology care.  If you have a lab appointment with the [facility], please come in thru the Main Entrance and check in at the main information desk.  Wear comfortable clothing and clothing appropriate for easy access to any Portacath or PICC line.   We strive to give you quality time with your provider. You may need to reschedule your appointment if you arrive late (15 or more minutes).  Arriving late affects you and other patients whose appointments are after yours.  Also, if you miss three or more appointments without notifying the office, you may be dismissed from the clinic at the provider's discretion.      For prescription refill requests, have your pharmacy contact our office and allow 72 hours for refills to be completed.    Today you received the following chemotherapy and/or immunotherapy agents Daratumumab and Xgeva, return as scheduled.   To help prevent nausea and vomiting after your treatment, we encourage you to take your nausea medication as directed.  BELOW ARE SYMPTOMS THAT SHOULD BE REPORTED IMMEDIATELY: *FEVER GREATER THAN 100.4 F (38 C) OR HIGHER *CHILLS OR SWEATING *NAUSEA AND VOMITING THAT IS NOT CONTROLLED WITH YOUR NAUSEA MEDICATION *UNUSUAL SHORTNESS OF BREATH *UNUSUAL BRUISING OR BLEEDING *URINARY PROBLEMS (pain or burning when urinating, or frequent urination) *BOWEL PROBLEMS (unusual diarrhea, constipation, pain near the anus) TENDERNESS IN MOUTH AND THROAT WITH OR WITHOUT PRESENCE OF ULCERS (sore throat, sores in mouth, or a toothache) UNUSUAL RASH, SWELLING OR PAIN  UNUSUAL VAGINAL DISCHARGE OR ITCHING   Items with * indicate a potential emergency and should be followed up as soon as possible or go to the Emergency Department if any problems should occur.  Please show the CHEMOTHERAPY ALERT CARD or IMMUNOTHERAPY ALERT  CARD at check-in to the Emergency Department and triage nurse.  Should you have questions after your visit or need to cancel or reschedule your appointment, please contact [facility] [phone]  and follow the prompts.  Office hours are 8:00 a.m. to 4:30 p.m. Monday - Friday. Please note that voicemails left after 4:00 p.m. may not be returned until the following business day.  We are closed weekends and major holidays. You have access to a nurse at all times for urgent questions. Please call the main number to the clinic [phone] and follow the prompts.  For any non-urgent questions, you may also contact your provider using MyChart. We now offer e-Visits for anyone 42 and older to request care online for non-urgent symptoms. For details visit [email or web address].   Also download the MyChart app! Go to the app store, search "MyChart", open the app, select [facility], and log in with your MyChart username and password.  Masks are optional in the cancer centers. If you would like for your care team to wear a mask while they are taking care of you, please let them know. For doctor visits, patients may have with them one support person who is at least 65 years old. At this time, visitors are not allowed in the infusion area.

## 2022-03-07 ENCOUNTER — Other Ambulatory Visit: Payer: Self-pay

## 2022-03-07 MED ORDER — LENALIDOMIDE 10 MG PO CAPS: 10.0000 mg | ORAL_CAPSULE | Freq: Every day | ORAL | 0 refills | Status: DC

## 2022-03-07 NOTE — Telephone Encounter (Signed)
Chart reviewed. Revlimid refilled per last office note with Dr. Katragadda.  

## 2022-03-15 ENCOUNTER — Other Ambulatory Visit (HOSPITAL_COMMUNITY): Payer: Self-pay

## 2022-03-16 ENCOUNTER — Other Ambulatory Visit: Payer: Self-pay

## 2022-03-18 ENCOUNTER — Other Ambulatory Visit: Payer: Self-pay | Admitting: Hematology

## 2022-03-18 DIAGNOSIS — C9000 Multiple myeloma not having achieved remission: Secondary | ICD-10-CM

## 2022-03-21 ENCOUNTER — Encounter: Payer: Self-pay | Admitting: Internal Medicine

## 2022-03-23 ENCOUNTER — Other Ambulatory Visit: Payer: Self-pay

## 2022-03-27 ENCOUNTER — Inpatient Hospital Stay: Payer: Medicare Other | Attending: Hematology

## 2022-03-27 ENCOUNTER — Inpatient Hospital Stay: Payer: Medicare Other

## 2022-03-27 VITALS — BP 174/82 | HR 68 | Temp 97.0°F | Resp 18

## 2022-03-27 DIAGNOSIS — Z5111 Encounter for antineoplastic chemotherapy: Secondary | ICD-10-CM | POA: Insufficient documentation

## 2022-03-27 DIAGNOSIS — C9000 Multiple myeloma not having achieved remission: Secondary | ICD-10-CM

## 2022-03-27 DIAGNOSIS — Z79899 Other long term (current) drug therapy: Secondary | ICD-10-CM | POA: Diagnosis not present

## 2022-03-27 LAB — CBC WITH DIFFERENTIAL/PLATELET
Abs Immature Granulocytes: 0.03 10*3/uL (ref 0.00–0.07)
Basophils Absolute: 0.1 10*3/uL (ref 0.0–0.1)
Basophils Relative: 1 %
Eosinophils Absolute: 0.1 10*3/uL (ref 0.0–0.5)
Eosinophils Relative: 2 %
HCT: 41.3 % (ref 39.0–52.0)
Hemoglobin: 13.4 g/dL (ref 13.0–17.0)
Immature Granulocytes: 1 %
Lymphocytes Relative: 14 %
Lymphs Abs: 0.9 10*3/uL (ref 0.7–4.0)
MCH: 27.3 pg (ref 26.0–34.0)
MCHC: 32.4 g/dL (ref 30.0–36.0)
MCV: 84.1 fL (ref 80.0–100.0)
Monocytes Absolute: 0.9 10*3/uL (ref 0.1–1.0)
Monocytes Relative: 14 %
Neutro Abs: 4.3 10*3/uL (ref 1.7–7.7)
Neutrophils Relative %: 68 %
Platelets: 176 10*3/uL (ref 150–400)
RBC: 4.91 MIL/uL (ref 4.22–5.81)
RDW: 16.1 % — ABNORMAL HIGH (ref 11.5–15.5)
WBC: 6.3 10*3/uL (ref 4.0–10.5)
nRBC: 0 % (ref 0.0–0.2)

## 2022-03-27 LAB — COMPREHENSIVE METABOLIC PANEL
ALT: 16 U/L (ref 0–44)
AST: 15 U/L (ref 15–41)
Albumin: 4 g/dL (ref 3.5–5.0)
Alkaline Phosphatase: 64 U/L (ref 38–126)
Anion gap: 8 (ref 5–15)
BUN: 27 mg/dL — ABNORMAL HIGH (ref 8–23)
CO2: 29 mmol/L (ref 22–32)
Calcium: 8.9 mg/dL (ref 8.9–10.3)
Chloride: 101 mmol/L (ref 98–111)
Creatinine, Ser: 1.23 mg/dL (ref 0.61–1.24)
GFR, Estimated: >60 mL/min (ref >60)
Glucose, Bld: 147 mg/dL — ABNORMAL HIGH (ref 70–99)
Potassium: 3.9 mmol/L (ref 3.5–5.1)
Sodium: 138 mmol/L (ref 135–145)
Total Bilirubin: 1.3 mg/dL — ABNORMAL HIGH (ref 0.3–1.2)
Total Protein: 6.4 g/dL — ABNORMAL LOW (ref 6.5–8.1)

## 2022-03-27 MED ORDER — DEXAMETHASONE 4 MG PO TABS
40.0000 mg | ORAL_TABLET | Freq: Once | ORAL | Status: AC
  Administered 2022-03-27: 40 mg via ORAL
  Filled 2022-03-27: qty 10

## 2022-03-27 MED ORDER — DENOSUMAB 120 MG/1.7ML ~~LOC~~ SOLN
120.0000 mg | Freq: Once | SUBCUTANEOUS | Status: AC
  Administered 2022-03-27: 120 mg via SUBCUTANEOUS
  Filled 2022-03-27: qty 1.7

## 2022-03-27 MED ORDER — ACETAMINOPHEN 325 MG PO TABS
650.0000 mg | ORAL_TABLET | Freq: Once | ORAL | Status: AC
  Administered 2022-03-27: 650 mg via ORAL
  Filled 2022-03-27: qty 2

## 2022-03-27 MED ORDER — DIPHENHYDRAMINE HCL 25 MG PO CAPS
50.0000 mg | ORAL_CAPSULE | Freq: Once | ORAL | Status: AC
  Administered 2022-03-27: 50 mg via ORAL
  Filled 2022-03-27: qty 2

## 2022-03-27 MED ORDER — DARATUMUMAB-HYALURONIDASE-FIHJ 1800-30000 MG-UT/15ML ~~LOC~~ SOLN
1800.0000 mg | Freq: Once | SUBCUTANEOUS | Status: AC
  Administered 2022-03-27: 1800 mg via SUBCUTANEOUS
  Filled 2022-03-27: qty 15

## 2022-03-27 NOTE — Patient Instructions (Signed)
[facility]  Discharge Instructions: Thank you for choosing [facility] to provide your oncology and hematology care.  If you have a lab appointment with the Cancer Center, please come in thru the Main Entrance and check in at the main information desk.  Wear comfortable clothing and clothing appropriate for easy access to any Portacath or PICC line.   We strive to give you quality time with your provider. You may need to reschedule your appointment if you arrive late (15 or more minutes).  Arriving late affects you and other patients whose appointments are after yours.  Also, if you miss three or more appointments without notifying the office, you may be dismissed from the clinic at the provider's discretion.      For prescription refill requests, have your pharmacy contact our office and allow 72 hours for refills to be completed.    Today you received the following chemotherapy and/or immunotherapy agents Daratumumab & Xgeva.   Denosumab Injection (Oncology) What is this medication? DENOSUMAB (den oh SUE mab) prevents weakened bones caused by cancer. It may also be used to treat noncancerous bone tumors that cannot be removed by surgery. It can also be used to treat high calcium levels in the blood caused by cancer. It works by blocking a protein that causes bones to break down quickly. This slows down the release of calcium from bones, which lowers calcium levels in your blood. It also makes your bones stronger and less likely to break (fracture). This medicine may be used for other purposes; ask your health care provider or pharmacist if you have questions. COMMON BRAND NAME(S): XGEVA What should I tell my care team before I take this medication? They need to know if you have any of these conditions: Dental disease Having surgery or tooth extraction Infection Kidney disease Low levels of calcium or vitamin D in the blood Malnutrition On  hemodialysis Skin conditions or sensitivity Thyroid or parathyroid disease An unusual reaction to denosumab, other medications, foods, dyes, or preservatives Pregnant or trying to get pregnant Breast-feeding How should I use this medication? This medication is for injection under the skin. It is given by your care team in a hospital or clinic setting. A special MedGuide will be given to you before each treatment. Be sure to read this information carefully each time. Talk to your care team about the use of this medication in children. While it may be prescribed for children as young as 13 years for selected conditions, precautions do apply. Overdosage: If you think you have taken too much of this medicine contact a poison control center or emergency room at once. NOTE: This medicine is only for you. Do not share this medicine with others. What if I miss a dose? Keep appointments for follow-up doses. It is important not to miss your dose. Call your care team if you are unable to keep an appointment. What may interact with this medication? Do not take this medication with any of the following: Other medications containing denosumab This medication may also interact with the following: Medications that lower your chance of fighting infection Steroid medications, such as prednisone or cortisone This list may not describe all possible interactions. Give your health care provider a list of all the medicines, herbs, non-prescription drugs, or dietary supplements you use. Also tell them if you smoke, drink alcohol, or use illegal drugs. Some items may interact with your medicine. What should I watch for while using this medication?   Your condition will be monitored carefully while you are receiving this medication. You may need blood work while taking this medication. This medication may increase your risk of getting an infection. Call your care team for advice if you get a fever, chills, sore throat,  or other symptoms of a cold or flu. Do not treat yourself. Try to avoid being around people who are sick. You should make sure you get enough calcium and vitamin D while you are taking this medication, unless your care team tells you not to. Discuss the foods you eat and the vitamins you take with your care team. Some people who take this medication have severe bone, joint, or muscle pain. This medication may also increase your risk for jaw problems or a broken thigh bone. Tell your care team right away if you have severe pain in your jaw, bones, joints, or muscles. Tell your care team if you have any pain that does not go away or that gets worse. Talk to your care team if you may be pregnant. Serious birth defects can occur if you take this medication during pregnancy and for 5 months after the last dose. You will need a negative pregnancy test before starting this medication. Contraception is recommended while taking this medication and for 5 months after the last dose. Your care team can help you find the option that works for you. What side effects may I notice from receiving this medication? Side effects that you should report to your care team as soon as possible: Allergic reactions--skin rash, itching, hives, swelling of the face, lips, tongue, or throat Bone, joint, or muscle pain Low calcium level--muscle pain or cramps, confusion, tingling, or numbness in the hands or feet Osteonecrosis of the jaw--pain, swelling, or redness in the mouth, numbness of the jaw, poor healing after dental work, unusual discharge from the mouth, visible bones in the mouth Side effects that usually do not require medical attention (report to your care team if they continue or are bothersome): Cough Diarrhea Fatigue Headache Nausea This list may not describe all possible side effects. Call your doctor for medical advice about side effects. You may report side effects to FDA at 1-800-FDA-1088. Where should I keep  my medication? This medication is given in a hospital or clinic. It will not be stored at home. NOTE: This sheet is a summary. It may not cover all possible information. If you have questions about this medicine, talk to your doctor, pharmacist, or health care provider.  2023 Elsevier/Gold Standard (2021-11-20 00:00:00)   Daratumumab; Hyaluronidase Injection What is this medication? DARATUMUMAB; HYALURONIDASE (dar a toom ue mab; hye al ur ON i dase) treats multiple myeloma, a type of bone marrow cancer. Daratumumab works by blocking a protein that causes cancer cells to grow and multiply. This helps to slow or stop the spread of cancer cells. Hyaluronidase works by increasing the absorption of other medications in the body to help them work better. This medication may also be used treat amyloidosis, a condition that causes the buildup of a protein (amyloid) in your body. It works by reducing the buildup of this protein, which decreases symptoms. It is a combination medication that contains a monoclonal antibody. This medicine may be used for other purposes; ask your health care provider or pharmacist if you have questions. COMMON BRAND NAME(S): DARZALEX FASPRO What should I tell my care team before I take this medication? They need to know if you have any of these conditions: Heart disease   Infection, such as chickenpox, cold sores, herpes, hepatitis B Lung or breathing disease An unusual or allergic reaction to daratumumab, hyaluronidase, other medications, foods, dyes, or preservatives Pregnant or trying to get pregnant Breast-feeding How should I use this medication? This medication is injected under the skin. It is given by your care team in a hospital or clinic setting. Talk to your care team about the use of this medication in children. Special care may be needed. Overdosage: If you think you have taken too much of this medicine contact a poison control center or emergency room at  once. NOTE: This medicine is only for you. Do not share this medicine with others. What if I miss a dose? Keep appointments for follow-up doses. It is important not to miss your dose. Call your care team if you are unable to keep an appointment. What may interact with this medication? Interactions have not been studied. This list may not describe all possible interactions. Give your health care provider a list of all the medicines, herbs, non-prescription drugs, or dietary supplements you use. Also tell them if you smoke, drink alcohol, or use illegal drugs. Some items may interact with your medicine. What should I watch for while using this medication? Your condition will be monitored carefully while you are receiving this medication. This medication can cause serious allergic reactions. To reduce your risk, your care team may give you other medication to take before receiving this one. Be sure to follow the directions from your care team. This medication can affect the results of blood tests to match your blood type. These changes can last for up to 6 months after the final dose. Your care team will do blood tests to match your blood type before you start treatment. Tell all of your care team that you are being treated with this medication before receiving a blood transfusion. This medication can affect the results of some tests used to determine treatment response; extra tests may be needed to evaluate response. Talk to your care team if you wish to become pregnant or think you are pregnant. This medication can cause serious birth defects if taken during pregnancy and for 3 months after the last dose. A reliable form of contraception is recommended while taking this medication and for 3 months after the last dose. Talk to your care team about effective forms of contraception. Do not breast-feed while taking this medication. What side effects may I notice from receiving this medication? Side effects  that you should report to your care team as soon as possible: Allergic reactions--skin rash, itching, hives, swelling of the face, lips, tongue, or throat Heart rhythm changes--fast or irregular heartbeat, dizziness, feeling faint or lightheaded, chest pain, trouble breathing Infection--fever, chills, cough, sore throat, wounds that don't heal, pain or trouble when passing urine, general feeling of discomfort or being unwell Infusion reactions--chest pain, shortness of breath or trouble breathing, feeling faint or lightheaded Sudden eye pain or change in vision such as blurry vision, seeing halos around lights, vision loss Unusual bruising or bleeding Side effects that usually do not require medical attention (report to your care team if they continue or are bothersome): Constipation Diarrhea Fatigue Nausea Pain, tingling, or numbness in the hands or feet Swelling of the ankles, hands, or feet This list may not describe all possible side effects. Call your doctor for medical advice about side effects. You may report side effects to FDA at 1-800-FDA-1088. Where should I keep my medication? This medication is given   in a hospital or clinic. It will not be stored at home. NOTE: This sheet is a summary. It may not cover all possible information. If you have questions about this medicine, talk to your doctor, pharmacist, or health care provider.  2023 Elsevier/Gold Standard (2021-10-25 00:00:00)        To help prevent nausea and vomiting after your treatment, we encourage you to take your nausea medication as directed.  BELOW ARE SYMPTOMS THAT SHOULD BE REPORTED IMMEDIATELY: *FEVER GREATER THAN 100.4 F (38 C) OR HIGHER *CHILLS OR SWEATING *NAUSEA AND VOMITING THAT IS NOT CONTROLLED WITH YOUR NAUSEA MEDICATION *UNUSUAL SHORTNESS OF BREATH *UNUSUAL BRUISING OR BLEEDING *URINARY PROBLEMS (pain or burning when urinating, or frequent urination) *BOWEL PROBLEMS (unusual diarrhea, constipation,  pain near the anus) TENDERNESS IN MOUTH AND THROAT WITH OR WITHOUT PRESENCE OF ULCERS (sore throat, sores in mouth, or a toothache) UNUSUAL RASH, SWELLING OR PAIN  UNUSUAL VAGINAL DISCHARGE OR ITCHING   Items with * indicate a potential emergency and should be followed up as soon as possible or go to the Emergency Department if any problems should occur.  Please show the CHEMOTHERAPY ALERT CARD or IMMUNOTHERAPY ALERT CARD at check-in to the Emergency Department and triage nurse.  Should you have questions after your visit or need to cancel or reschedule your appointment, please contact [facility] AT  [phone]  and follow the prompts.  Office hours are 8:00 a.m. to 4:30 p.m. Monday - Friday. Please note that voicemails left after 4:00 p.m. may not be returned until the following business day.  We are closed weekends and major holidays. You have access to a nurse at all times for urgent questions. Please call the main number to the clinic [phone] and follow the prompts.  For any non-urgent questions, you may also contact your provider using MyChart. We now offer e-Visits for anyone 18 and older to request care online for non-urgent symptoms. For details visit [email or web address].   Also download the MyChart app! Go to the app store, search "MyChart", open the app, select [facility], and log in with your MyChart username and password.  Masks are optional in the cancer centers. If you would like for your care team to wear a mask while they are taking care of you, please let them know. You may have one support person who is at least 65 years old accompany you for your appointments.  

## 2022-03-27 NOTE — Progress Notes (Signed)
Patient presents today for Xgeva and Dartumumab injections.  Patient is in satisfactory condition with no complaints voiced.  Vital signs are stable.  Labs reviewed and all labs are within treatment parameters.  We will proceed with injections per provider orders.   Patient tolerated injections well with no complaints voiced.  Patient left ambulatory in stable condition.  Vital signs stable at discharge.  Follow up as scheduled.

## 2022-03-30 ENCOUNTER — Other Ambulatory Visit: Payer: Self-pay

## 2022-04-04 ENCOUNTER — Other Ambulatory Visit: Payer: Self-pay

## 2022-04-04 MED ORDER — LENALIDOMIDE 10 MG PO CAPS: 10.0000 mg | ORAL_CAPSULE | Freq: Every day | ORAL | 0 refills | Status: DC

## 2022-04-04 NOTE — Telephone Encounter (Signed)
Chart reviewed. Revlimid refilled per last office note with Dr. Katragadda.  

## 2022-04-16 IMAGING — CT CT BIOPSY BONE MARROW
1 of 4 series · 12 of 32 positions shown, 17 images · non-contrast
Comparison: none

Addendum:
INDICATION: 64-year-old male with history of multiple myeloma.

[Series 2: i-spiral 5.0 b40f · axial · 0.96mm/px · z∈[+702,+831]mm · 12 of 45 slices shown, 17 images]
[im 4/45  soft-tissue]
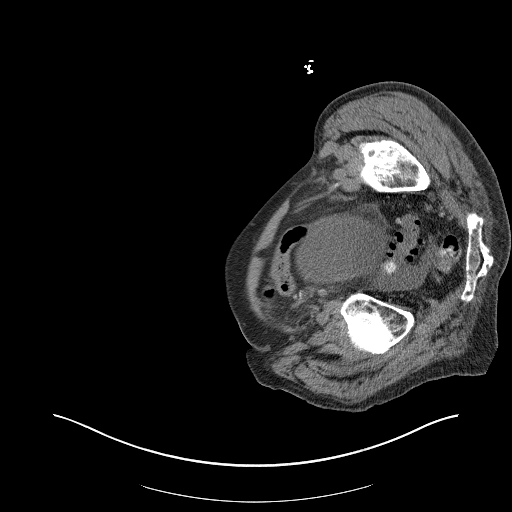
[im 4/45  bone]
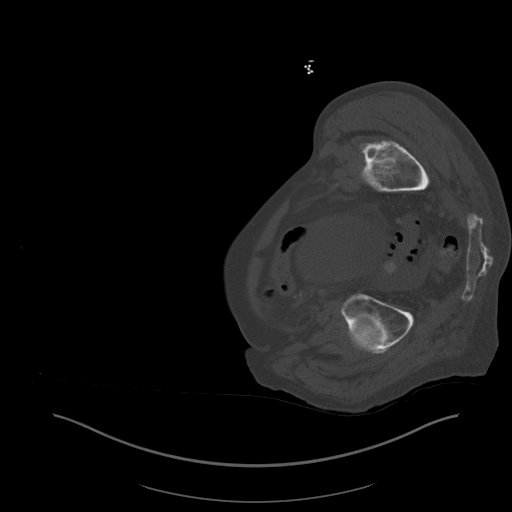
[im 7/45  soft-tissue]
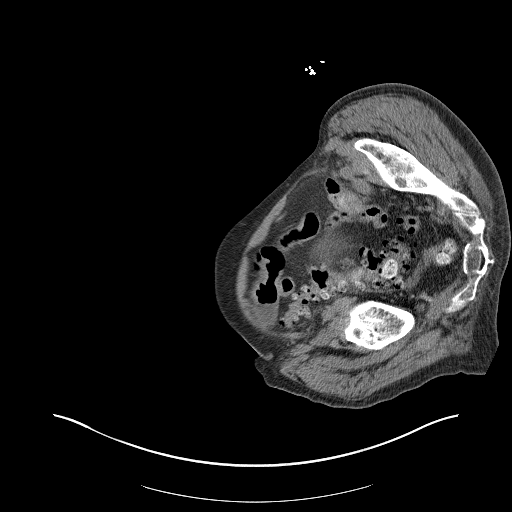
[im 10/45  soft-tissue]
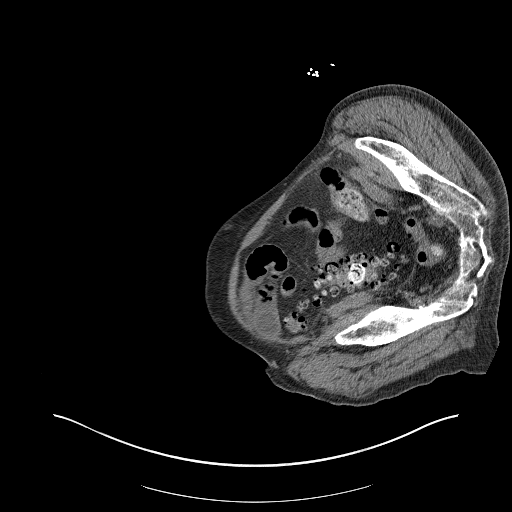
[im 16/45  soft-tissue]
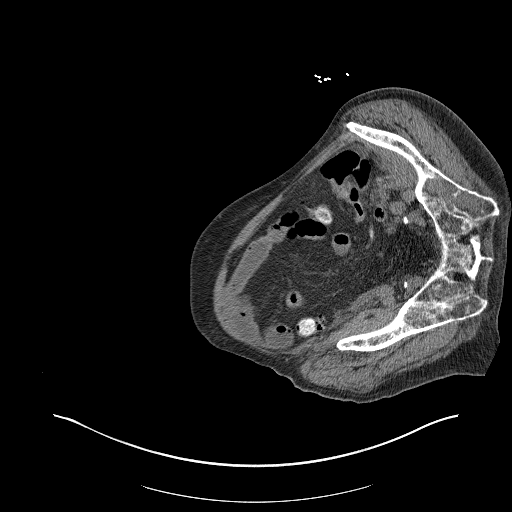
[im 19/45  soft-tissue]
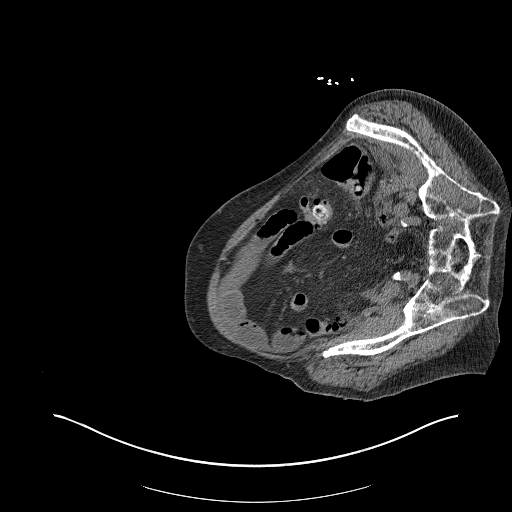
[im 23/45  soft-tissue]
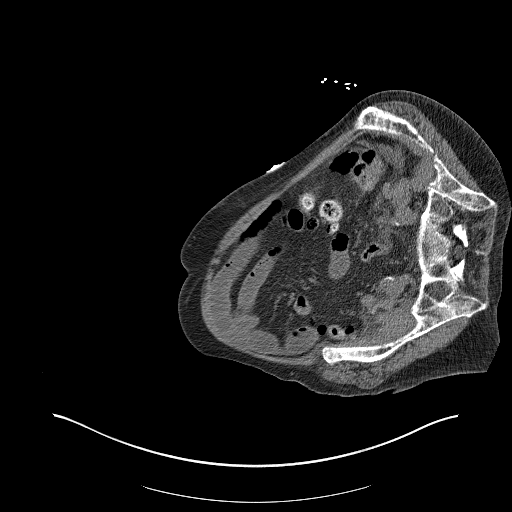
[im 26/45  soft-tissue]
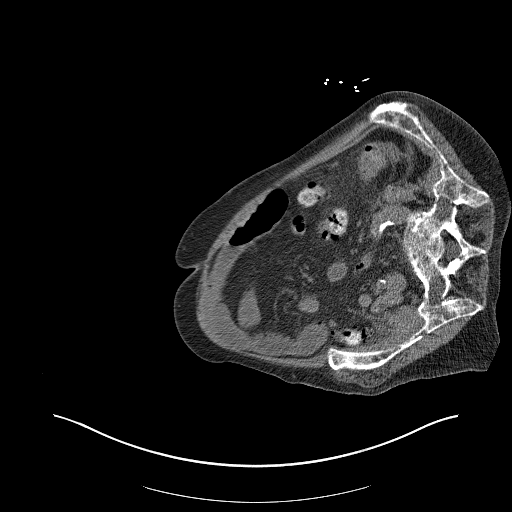
[im 29/45  soft-tissue]
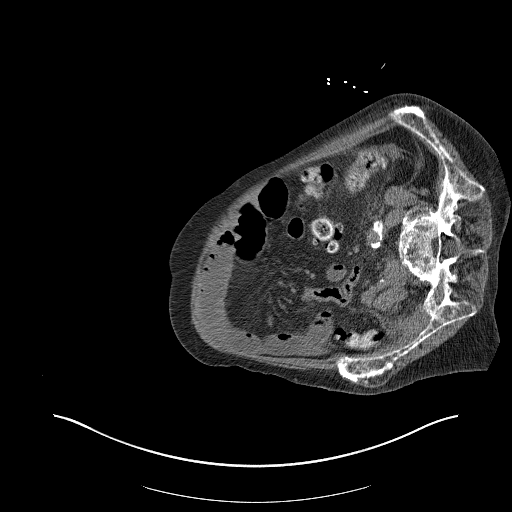
[im 32/45  lung]
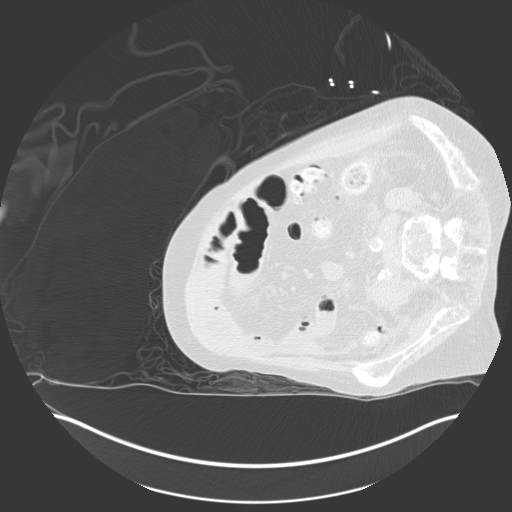
[im 35/45  soft-tissue]
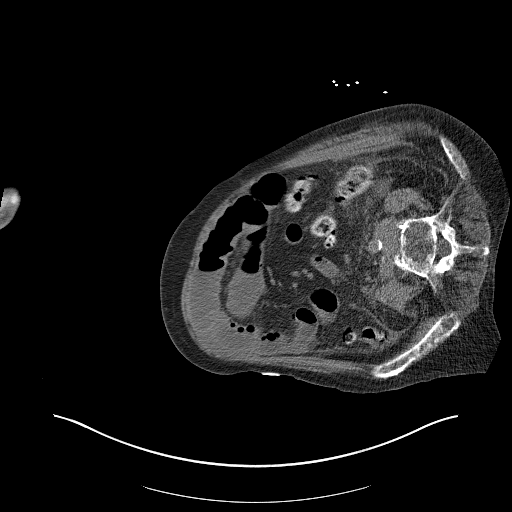
[im 35/45  lung]
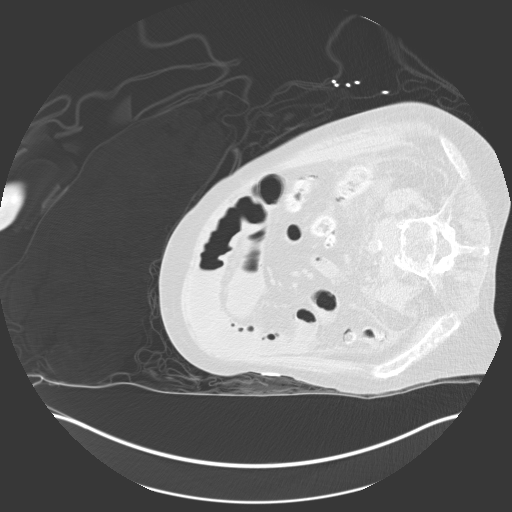
[im 35/45  bone]
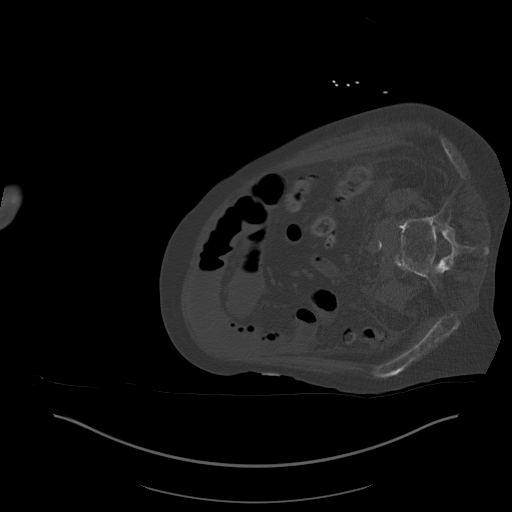
[im 38/45  soft-tissue]
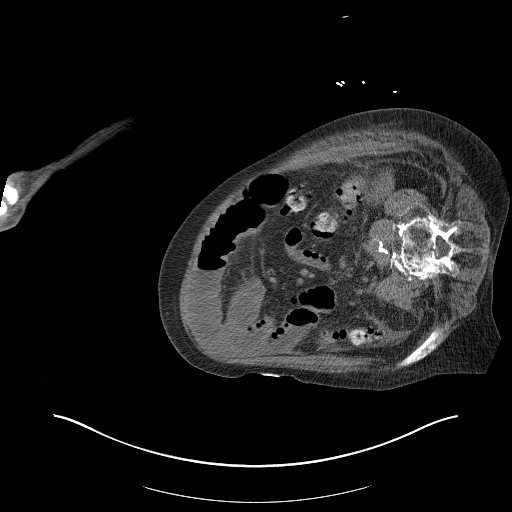
[im 38/45  lung]
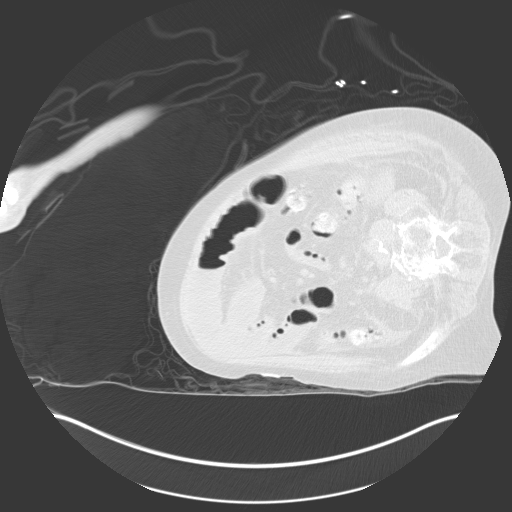
[im 41/45  soft-tissue]
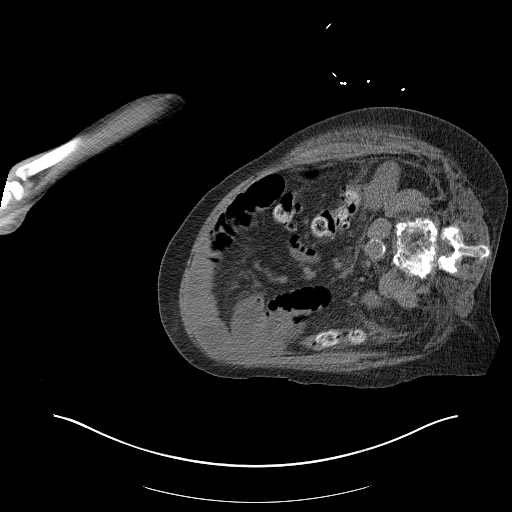
[im 41/45  lung]
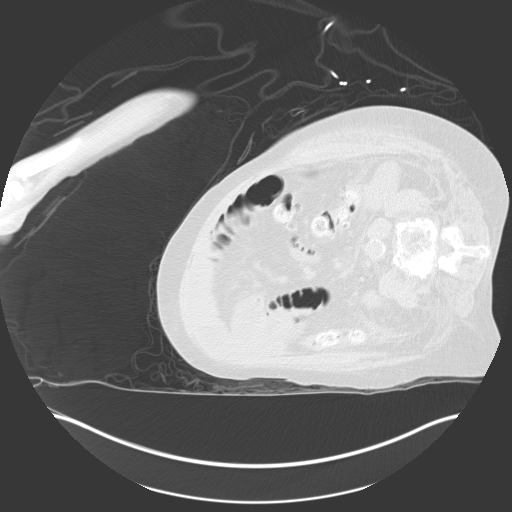

[12 of 32 positions shown; findings below may reference images not displayed]

EXAM:
CT-GUIDED BONE MARROW BIOPSY AND ASPIRATION

MEDICATIONS:
None

ANESTHESIA/SEDATION:
Fentanyl 37.5 mcg IV; Versed 1 mg IV

Sedation Time: 16 minutes; The patient was continuously monitored
during the procedure by the interventional radiology nurse under my
direct supervision.

COMPLICATIONS:
None immediate.

PROCEDURE:
Informed consent was obtained from the patient following an
explanation of the procedure, risks, benefits and alternatives. The
patient understands, agrees and consents for the procedure. All
questions were addressed. A time out was performed prior to the
initiation of the procedure.

The patient was positioned prone and non-contrast localization CT
was performed of the pelvis to demonstrate the iliac marrow spaces.
The operative site was prepped and draped in the usual sterile
fashion.

Under sterile conditions and local anesthesia, a 22 gauge spinal
needle was utilized for procedural planning. Next, an 11 gauge
coaxial bone biopsy needle was advanced into the right iliac marrow
space. Needle position was confirmed with CT imaging. Initially, a
bone marrow aspiration was performed. Next, a bone marrow biopsy was
obtained with the 11 gauge outer bone marrow device. The 11 gauge
coaxial bone biopsy needle was re-advanced into a slightly different
location within the left iliac marrow space, positioning was
confirmed with CT imaging and an additional bone marrow biopsy was
obtained. Samples were prepared with the cytotechnologist and deemed
adequate. The needle was removed and superficial hemostasis was
obtained with manual compression. A dressing was applied. The
patient tolerated the procedure well without immediate post
procedural complication.
IMPRESSION: Successful CT guided right iliac bone marrow aspiration and core
biopsy.

ADDENDUM:
In the third paragraph of the procedure section below, the report
reads " The 11 gauge
coaxial bone biopsy needle was re-advanced into a slightly different
location within the LEFT iliac marrow space, positioning was
confirmed with CT imaging and an additional bone marrow biopsy was
obtained."

The report should read "the 11 gauge coaxial bone needle was
readvanced into a slightly different location within the RIGHT iliac
marrow space, positioning was confirmed with CT imaging and
additional bone marrow biopsy was obtained. And quotations

*** End of Addendum ***
EXAM:
CT-GUIDED BONE MARROW BIOPSY AND ASPIRATION

MEDICATIONS:
None

ANESTHESIA/SEDATION:
Fentanyl 37.5 mcg IV; Versed 1 mg IV

Sedation Time: 16 minutes; The patient was continuously monitored
during the procedure by the interventional radiology nurse under my
direct supervision.

COMPLICATIONS:
None immediate.

PROCEDURE:
Informed consent was obtained from the patient following an
explanation of the procedure, risks, benefits and alternatives. The
patient understands, agrees and consents for the procedure. All
questions were addressed. A time out was performed prior to the
initiation of the procedure.

The patient was positioned prone and non-contrast localization CT
was performed of the pelvis to demonstrate the iliac marrow spaces.
The operative site was prepped and draped in the usual sterile
fashion.

Under sterile conditions and local anesthesia, a 22 gauge spinal
needle was utilized for procedural planning. Next, an 11 gauge
coaxial bone biopsy needle was advanced into the right iliac marrow
space. Needle position was confirmed with CT imaging. Initially, a
bone marrow aspiration was performed. Next, a bone marrow biopsy was
obtained with the 11 gauge outer bone marrow device. The 11 gauge
coaxial bone biopsy needle was re-advanced into a slightly different
location within the left iliac marrow space, positioning was
confirmed with CT imaging and an additional bone marrow biopsy was
obtained. Samples were prepared with the cytotechnologist and deemed
adequate. The needle was removed and superficial hemostasis was
obtained with manual compression. A dressing was applied. The
patient tolerated the procedure well without immediate post
procedural complication.
IMPRESSION: Successful CT guided right iliac bone marrow aspiration and core
biopsy.

## 2022-04-17 ENCOUNTER — Other Ambulatory Visit: Payer: Self-pay

## 2022-04-20 ENCOUNTER — Other Ambulatory Visit: Payer: Self-pay

## 2022-04-21 ENCOUNTER — Other Ambulatory Visit: Payer: Self-pay

## 2022-04-22 ENCOUNTER — Other Ambulatory Visit: Payer: Self-pay

## 2022-04-24 ENCOUNTER — Inpatient Hospital Stay: Payer: Medicare Other | Attending: Hematology

## 2022-04-24 ENCOUNTER — Inpatient Hospital Stay: Payer: Medicare Other

## 2022-04-24 ENCOUNTER — Inpatient Hospital Stay (HOSPITAL_BASED_OUTPATIENT_CLINIC_OR_DEPARTMENT_OTHER): Payer: Medicare Other | Admitting: Hematology

## 2022-04-24 ENCOUNTER — Inpatient Hospital Stay: Payer: Medicare Other | Admitting: Hematology

## 2022-04-24 ENCOUNTER — Ambulatory Visit (HOSPITAL_COMMUNITY): Payer: Medicare Other | Admitting: Hematology

## 2022-04-24 ENCOUNTER — Encounter: Payer: Self-pay | Admitting: Internal Medicine

## 2022-04-24 ENCOUNTER — Other Ambulatory Visit (HOSPITAL_COMMUNITY): Payer: Medicare Other

## 2022-04-24 VITALS — BP 188/70 | HR 75 | Temp 97.4°F | Resp 18 | Wt 193.8 lb

## 2022-04-24 DIAGNOSIS — C9000 Multiple myeloma not having achieved remission: Secondary | ICD-10-CM | POA: Insufficient documentation

## 2022-04-24 DIAGNOSIS — Z5112 Encounter for antineoplastic immunotherapy: Secondary | ICD-10-CM | POA: Diagnosis present

## 2022-04-24 DIAGNOSIS — Z79899 Other long term (current) drug therapy: Secondary | ICD-10-CM | POA: Diagnosis not present

## 2022-04-24 LAB — CBC WITH DIFFERENTIAL/PLATELET
Abs Immature Granulocytes: 0.03 10*3/uL (ref 0.00–0.07)
Basophils Absolute: 0.1 10*3/uL (ref 0.0–0.1)
Basophils Relative: 1 %
Eosinophils Absolute: 0.2 10*3/uL (ref 0.0–0.5)
Eosinophils Relative: 2 %
HCT: 42 % (ref 39.0–52.0)
Hemoglobin: 13.8 g/dL (ref 13.0–17.0)
Immature Granulocytes: 0 %
Lymphocytes Relative: 15 %
Lymphs Abs: 1 10*3/uL (ref 0.7–4.0)
MCH: 27.7 pg (ref 26.0–34.0)
MCHC: 32.9 g/dL (ref 30.0–36.0)
MCV: 84.2 fL (ref 80.0–100.0)
Monocytes Absolute: 1 10*3/uL (ref 0.1–1.0)
Monocytes Relative: 14 %
Neutro Abs: 4.6 10*3/uL (ref 1.7–7.7)
Neutrophils Relative %: 68 %
Platelets: 178 10*3/uL (ref 150–400)
RBC: 4.99 MIL/uL (ref 4.22–5.81)
RDW: 16 % — ABNORMAL HIGH (ref 11.5–15.5)
WBC: 6.9 10*3/uL (ref 4.0–10.5)
nRBC: 0 % (ref 0.0–0.2)

## 2022-04-24 LAB — COMPREHENSIVE METABOLIC PANEL
ALT: 17 U/L (ref 0–44)
AST: 18 U/L (ref 15–41)
Albumin: 4.1 g/dL (ref 3.5–5.0)
Alkaline Phosphatase: 62 U/L (ref 38–126)
Anion gap: 10 (ref 5–15)
BUN: 28 mg/dL — ABNORMAL HIGH (ref 8–23)
CO2: 28 mmol/L (ref 22–32)
Calcium: 8.8 mg/dL — ABNORMAL LOW (ref 8.9–10.3)
Chloride: 99 mmol/L (ref 98–111)
Creatinine, Ser: 1.12 mg/dL (ref 0.61–1.24)
GFR, Estimated: >60 mL/min (ref >60)
Glucose, Bld: 144 mg/dL — ABNORMAL HIGH (ref 70–99)
Potassium: 3.7 mmol/L (ref 3.5–5.1)
Sodium: 137 mmol/L (ref 135–145)
Total Bilirubin: 1.4 mg/dL — ABNORMAL HIGH (ref 0.3–1.2)
Total Protein: 6.5 g/dL (ref 6.5–8.1)

## 2022-04-24 MED ORDER — ACETAMINOPHEN 325 MG PO TABS
650.0000 mg | ORAL_TABLET | Freq: Once | ORAL | Status: AC
  Administered 2022-04-24: 650 mg via ORAL
  Filled 2022-04-24: qty 2

## 2022-04-24 MED ORDER — DEXAMETHASONE 4 MG PO TABS
40.0000 mg | ORAL_TABLET | Freq: Once | ORAL | Status: AC
  Administered 2022-04-24: 40 mg via ORAL
  Filled 2022-04-24: qty 10

## 2022-04-24 MED ORDER — DIPHENHYDRAMINE HCL 25 MG PO CAPS
50.0000 mg | ORAL_CAPSULE | Freq: Once | ORAL | Status: AC
  Administered 2022-04-24: 50 mg via ORAL
  Filled 2022-04-24: qty 2

## 2022-04-24 MED ORDER — DARATUMUMAB-HYALURONIDASE-FIHJ 1800-30000 MG-UT/15ML ~~LOC~~ SOLN
1800.0000 mg | Freq: Once | SUBCUTANEOUS | Status: AC
  Administered 2022-04-24: 1800 mg via SUBCUTANEOUS
  Filled 2022-04-24: qty 15

## 2022-04-24 MED ORDER — DENOSUMAB 120 MG/1.7ML ~~LOC~~ SOLN
120.0000 mg | Freq: Once | SUBCUTANEOUS | Status: AC
  Administered 2022-04-24: 120 mg via SUBCUTANEOUS
  Filled 2022-04-24: qty 1.7

## 2022-04-24 NOTE — Progress Notes (Signed)
[place], [place] [address]   CLINIC:  Medical Oncology/Hematology  PCP:  Carlena Hurl, PA-C [address] / [place] [address] [phone]   REASON FOR VISIT:  Follow-up for multiple myeloma  PRIOR THERAPY: none  NGS Results: not done  CURRENT THERAPY: DaraBorD every 3 weeks; Revlimid 10 mg 3/2 weeks raBorD every 3 weeks; Revlimid 10 mg 3/2 weeks  BRIEF ONCOLOGIC HISTORY:  Oncology History  Multiple myeloma not having achieved remission ([place])  11/01/2020 Initial Diagnosis   Multiple myeloma not having achieved remission ([place])   11/02/2020 - 11/02/2020 Chemotherapy         11/16/2020 -  Chemotherapy   Patient is on Treatment Plan : MYELOMA NEWLY DIAGNOSED TRANSPLANT CANDIDATE DaraVRd (Daratumumab SQ) q21d x 6 Cycles (Induction/Consolidation)     11/21/2020 - 02/27/2022 Chemotherapy   Patient is on Treatment Plan : MYELOMA NEWLY DIAGNOSED TRANSPLANT CANDIDATE DaraVRd (Daratumumab SQ) q21d x 6 Cycles (Induction/Consolidation)     04/24/2021 - 04/24/2021 Chemotherapy   Patient is on Treatment Plan : MYELOMA NEWLY DIAGNOSED TRANSPLANT CANDIDATE Daratumumab SQ + Lenalidomide q28d (Maintenance)       CANCER STAGING:  Cancer Staging  No matching staging information was found for the patient.  INTERVAL HISTORY:  [name], a 66 y.o. male, seen for follow-up of multiple myeloma.  He is tolerating Revlimid very well.  Denies any GI side effects.  Denies any fevers or infections.  No recent hospitalizations or ER visits.  He has good appetite.  REVIEW OF SYSTEMS:  Review of Systems  All other systems reviewed and are negative.   PAST MEDICAL/SURGICAL HISTORY:  Past Medical History:  Diagnosis Date   Broken leg    Colonoscopy refused 09/2017   Hyperlipidemia    Hypertension    Multiple myeloma ([place])    Pneumonia    Refuses treatment 09/2017   refuses screenings such as cancer screening, refuses vaccines,  refuses normal preventative care   Vaccine refused by patient    all vaccines as of 09/2017   Past Surgical History:  Procedure Laterality Date   COLONOSCOPY     never, declines as of 09/2017    SOCIAL HISTORY:  Social History   Socioeconomic History   Marital status: Divorced    Spouse name: Not on file   Number of children: 1   Years of education: Not on file   Highest education level: Not on file  Occupational History   Occupation: Disability  Tobacco Use   Smoking status: Never   Smokeless tobacco: Never  Substance and Sexual Activity   Alcohol use: No   Drug use: Never   Sexual activity: Not Currently  Other Topics Concern   Not on file  Social History Narrative   Not on file   Social Determinants of Health   Financial Resource Strain: Medium Risk (11/15/2020)   Overall Financial Resource Strain (CARDIA)    Difficulty of Paying Living Expenses: Somewhat hard  Food Insecurity: No Food Insecurity (11/15/2020)   Hunger Vital Sign    Worried About Running Out of Food in the Last Year: Never true    [place] in the Last Year: Never true  Transportation Needs: No Transportation Needs (11/15/2020)   PRAPARE - Hydrologist (Medical): No    Lack of Transportation (Non-Medical): No  Physical Activity: Insufficiently Active (11/21/2020)   Exercise Vital Sign    Days of Exercise per  Week: 5 days    Minutes of Exercise per Session: 20 min  Stress: No Stress Concern Present (11/21/2020)   [place]    Feeling of Stress : Not at all  Social Connections: Socially Isolated (11/21/2020)   Social Connection and Isolation Panel [NHANES]    Frequency of Communication with Friends and Family: More than three times a week    Frequency of Social Gatherings with Friends and Family: More than three times a week    Attends Religious Services: Never    Marine scientist or Organizations: No     Attends Archivist Meetings: Never    Marital Status: Divorced  Human resources officer Violence: Not At Risk (11/21/2020)   Humiliation, Afraid, Rape, and Kick questionnaire    Fear of Current or Ex-Partner: No    Emotionally Abused: No    Physically Abused: No    Sexually Abused: No    FAMILY HISTORY:  No family history on file.  CURRENT MEDICATIONS:  Current Outpatient Medications  Medication Sig Dispense Refill   acetaminophen (TYLENOL) 325 MG tablet Take 2 tablets (650 mg total) by mouth every 6 (six) hours as needed for mild pain (or Fever >/= 101). 30 tablet 30   amLODipine (NORVASC) 10 MG tablet TAKE ONE TABLET BY MOUTH ONCE DAILY 30 tablet 3   aspirin 81 MG chewable tablet Chew by mouth daily.     calcium-vitamin D (OSCAL WITH D) 500-5 MG-MCG tablet Take 1 tablet by mouth daily. 30 tablet 3   feeding supplement (ENSURE ENLIVE / ENSURE PLUS) LIQD Take 237 mLs by mouth 3 (three) times daily between meals. 237 mL 12   lenalidomide (REVLIMID) 10 MG capsule Take 1 capsule (10 mg total) by mouth daily. 21 days on, 7 days off 21 capsule 0   No current facility-administered medications for this visit.    ALLERGIES:  No Known Allergies  PHYSICAL EXAM:  Performance status (ECOG): 1 - Symptomatic but completely ambulatory  Vitals:   04/24/22 1045  BP: (!) 188/70  Pulse: 75  Resp: 18  Temp: (!) 97.4 F (36.3 C)  SpO2: 98%   Wt Readings from Last 3 Encounters:  04/24/22 193 lb 12.6 oz (87.9 kg)  02/27/22 192 lb (87.1 kg)  01/30/22 188 lb 11.2 oz (85.6 kg)   Physical Exam Vitals reviewed.  Constitutional:      Appearance: Normal appearance.  Cardiovascular:     Rate and Rhythm: Normal rate and regular rhythm.     Pulses: Normal pulses.     Heart sounds: Normal heart sounds.  Pulmonary:     Effort: Pulmonary effort is normal.     Breath sounds: Normal breath sounds.  Neurological:     General: No focal deficit present.     Mental Status: He is alert and  oriented to person, place, and time.  Psychiatric:        Mood and Affect: Mood normal.        Behavior: Behavior normal.    LABORATORY DATA:  I have reviewed the labs as listed.     Latest Ref Rng & Units 04/24/2022   10:20 AM 03/27/2022   11:01 AM 02/27/2022   10:15 AM  CBC  WBC 4.0 - 10.5 K/uL 6.9  6.3  7.0   Hemoglobin 13.0 - 17.0 g/dL 13.8  13.4  13.8   Hematocrit 39.0 - 52.0 % 42.0  41.3  42.4   Platelets 150 - 400  K/uL 178  176  172       Latest Ref Rng & Units 04/24/2022   10:20 AM 03/27/2022   11:01 AM 02/27/2022   10:15 AM  CMP  Glucose 70 - 99 mg/dL 144  147  130   BUN 8 - 23 mg/dL $Remove'28  27  29   'AhWVeNL$ Creatinine 0.61 - 1.24 mg/dL 1.12  1.23  1.31   Sodium 135 - 145 mmol/L 137  138  139   Potassium 3.5 - 5.1 mmol/L 3.7  3.9  3.9   Chloride 98 - 111 mmol/L 99  101  103   CO2 22 - 32 mmol/L $RemoveB'28  29  28   'khztnhxq$ Calcium 8.9 - 10.3 mg/dL 8.8  8.9  8.8   Total Protein 6.5 - 8.1 g/dL 6.5  6.4  6.5   Total Bilirubin 0.3 - 1.2 mg/dL 1.4  1.3  1.1   Alkaline Phos 38 - 126 U/L 62  64  63   AST 15 - 41 U/L $Remo'18  15  20   'FqFwo$ ALT 0 - 44 U/L $Remo'17  16  18     'veeNH$ DIAGNOSTIC IMAGING:  I have independently reviewed the scans and discussed with the patient. No results found.   ASSESSMENT:  1.  IgG kappa light chain multiple myeloma: - Admission with hypercalcemia and renal failure. - CT CAP showed extensive lytic foci throughout the axial and appendicular skeleton of the chest, abdomen and pelvis.  Splenomegaly with an enlarged lobular spleen, nonspecific. - Bone marrow biopsy on 11/01/2020 shows 85% atypical plasma cells in the aspirate. - FISH panel positive for 1 p-,-13,14q-,16q-,17p-/-17 and 20q- - Cytogenetics-no metaphases available for analysis. - SPEP with 5.4 g of M spike.  Free light chain ratio 1030.  LDH normal.  Beta-2 microglobulin 18.7.  24-hour urine total protein 3.6 g. - 6 cycles of Dara VRD from 11/21/2020 through 03/07/2021. - Patient declined bone marrow transplant. - Maintenance  Revlimid and monthly daratumumab started on 03/27/2021.   2.  Social/family history: - He worked as a Administrator.  He lives by himself. - Mother with breast and ovarian cancer.  Father had metastatic kidney cancer.  His brother's daughter has CML.   PLAN:  1.  IgG kappa light chain multiple myeloma, high risk: - He is taking Revlimid 10 mg 3 weeks on/1 week off.  He is also on Darzalex monthly. - Reviewed myeloma labs from 01/30/2022 which did not show M spike.  Immunofixation shows IgG kappa. - Reviewed labs from today which shows normal LFTs with mildly elevated bilirubin of 1.4.  Creatinine and calcium are normal.  CBC was normal. - We have sent myeloma panel from today which is pending. - Recommend continuing Revlimid 3 weeks on 1 week off and Darzalex monthly. - RTC 3 months for follow-up with repeat myeloma labs.   2.  Myeloma bone disease: - Continue calcium and vitamin D supplements.  Calcium today is 8.8 with almond 4.1. - Continue denosumab today and monthly.  3.  ID prophylaxis: - Continue acyclovir twice daily and aspirin 81 mg daily.   Orders placed this encounter:  Orders Placed This Encounter  Procedures   CBC with Differential   Comprehensive metabolic panel   Magnesium   Kappa/lambda light chains   Immunofixation electrophoresis   Protein electrophoresis, serum   CBC with Differential   Comprehensive metabolic panel      Derek Jack, MD [place] [phone]

## 2022-04-24 NOTE — Progress Notes (Signed)
Patient and labs assessed by Dr. Delton Coombes, patient okay for treatment with BP of 188/70.  Patient taking calcium as directed. Denied tooth, jaw, and leg pain. No recent or upcoming dental visits. Labs reviewed. Patient tolerated injection with no complaints voiced. See MAR for details. Patient stable during and after injection. Site clean and dry with no bruising or swelling noted. Band aid applied. Patient tolerated Daratumumab injection with no complaints voiced. See MAR for details. Lab reviewed. Injection site clean and dry with no bruising or swelling noted at site. Band aid applied. Vss with discharge and left in satisfactory condition with nos/s of distress noted. Marland Kitchen

## 2022-04-24 NOTE — Progress Notes (Signed)
Patient is taking Revlimid as prescribed.  He has not missed any doses and reports no side effects at this time.    Patient has been examined by Dr. Katragadda, and vital signs and labs have been reviewed. ANC, Creatinine, LFTs, hemoglobin, and platelets are within treatment parameters per M.D. - pt may proceed with treatment.  Primary RN and pharmacy notified.  

## 2022-04-24 NOTE — Patient Instructions (Signed)
[facility]  Discharge Instructions: Thank you for choosing [facility] to provide your oncology and hematology care.  If you have a lab appointment with the Cancer Center, please come in thru the Main Entrance and check in at the main information desk.  Wear comfortable clothing and clothing appropriate for easy access to any Portacath or PICC line.   We strive to give you quality time with your provider. You may need to reschedule your appointment if you arrive late (15 or more minutes).  Arriving late affects you and other patients whose appointments are after yours.  Also, if you miss three or more appointments without notifying the office, you may be dismissed from the clinic at the provider's discretion.      For prescription refill requests, have your pharmacy contact our office and allow 72 hours for refills to be completed.    Today you received the following chemotherapy and/or immunotherapy agents Daratumumab and Xgeva, return as scheduled.    To help prevent nausea and vomiting after your treatment, we encourage you to take your nausea medication as directed.  BELOW ARE SYMPTOMS THAT SHOULD BE REPORTED IMMEDIATELY: *FEVER GREATER THAN 100.4 F (38 C) OR HIGHER *CHILLS OR SWEATING *NAUSEA AND VOMITING THAT IS NOT CONTROLLED WITH YOUR NAUSEA MEDICATION *UNUSUAL SHORTNESS OF BREATH *UNUSUAL BRUISING OR BLEEDING *URINARY PROBLEMS (pain or burning when urinating, or frequent urination) *BOWEL PROBLEMS (unusual diarrhea, constipation, pain near the anus) TENDERNESS IN MOUTH AND THROAT WITH OR WITHOUT PRESENCE OF ULCERS (sore throat, sores in mouth, or a toothache) UNUSUAL RASH, SWELLING OR PAIN  UNUSUAL VAGINAL DISCHARGE OR ITCHING   Items with * indicate a potential emergency and should be followed up as soon as possible or go to the Emergency Department if any problems should occur.  Please show the CHEMOTHERAPY ALERT CARD or IMMUNOTHERAPY ALERT  CARD at check-in to the Emergency Department and triage nurse.  Should you have questions after your visit or need to cancel or reschedule your appointment, please contact [facility] [phone]  and follow the prompts.  Office hours are 8:00 a.m. to 4:30 p.m. Monday - Friday. Please note that voicemails left after 4:00 p.m. may not be returned until the following business day.  We are closed weekends and major holidays. You have access to a nurse at all times for urgent questions. Please call the main number to the clinic [phone] and follow the prompts.  For any non-urgent questions, you may also contact your provider using MyChart. We now offer e-Visits for anyone 18 and older to request care online for non-urgent symptoms. For details visit [email or web address].   Also download the MyChart app! Go to the app store, search "MyChart", open the app, select [facility], and log in with your MyChart username and password.  Masks are optional in the cancer centers. If you would like for your care team to wear a mask while they are taking care of you, please let them know. You may have one support person who is at least 65 years old accompany you for your appointments.  

## 2022-04-24 NOTE — Patient Instructions (Addendum)
Paul Norton at [facility] Discharge Instructions   You were seen and examined today by Dr. Delton Coombes.  He reviewed the results of your labs which are normal/stable.  We will proceed with your treatment today.  Continue Revlimid as prescribed.   Return as scheduled.    Thank you for choosing [facility] to provide your oncology and hematology care.  To afford each patient quality time with our provider, please arrive at least 15 minutes before your scheduled appointment time.   If you have a lab appointment with the [facility] please come in thru the Main Entrance and check in at the main information desk.  You need to re-schedule your appointment should you arrive 10 or more minutes late.  We strive to give you quality time with our providers, and arriving late affects you and other patients whose appointments are after yours.  Also, if you no show three or more times for appointments you may be dismissed from the clinic at the providers discretion.     Again, thank you for choosing [facility].  Our hope is that these requests will decrease the amount of time that you wait before being seen by our physicians.       _____________________________________________________________  Should you have questions after your visit to [facility], please contact our office at [phone] and follow the prompts.  Our office hours are 8:00 a.m. and 4:30 p.m. Monday - Friday.  Please note that voicemails left after 4:00 p.m. may not be returned until the following business day.  We are closed weekends and major holidays.  You do have access to a nurse 24-7, just call the main number to the clinic [phone] and do not press any options, hold on the line and a nurse will answer the phone.    For prescription refill requests, have your pharmacy contact our office and allow 72 hours.    Due to Covid, you will need  to wear a mask upon entering the hospital. If you do not have a mask, a mask will be given to you at the Main Entrance upon arrival. For doctor visits, patients may have 1 support person age 57 or older with them. For treatment visits, patients can not have anyone with them due to social distancing guidelines and our immunocompromised population.

## 2022-04-25 ENCOUNTER — Other Ambulatory Visit: Payer: Self-pay

## 2022-04-25 LAB — MAGNESIUM: Magnesium: 2 mg/dL (ref 1.7–2.4)

## 2022-04-26 ENCOUNTER — Other Ambulatory Visit: Payer: Self-pay

## 2022-04-26 LAB — KAPPA/LAMBDA LIGHT CHAINS
Kappa free light chain: 9.4 mg/L (ref 3.3–19.4)
Kappa, lambda light chain ratio: 2.29 — ABNORMAL HIGH (ref 0.26–1.65)
Lambda free light chains: 4.1 mg/L — ABNORMAL LOW (ref 5.7–26.3)

## 2022-04-27 LAB — PROTEIN ELECTROPHORESIS, SERUM
A/G Ratio: 1.7 (ref 0.7–1.7)
Albumin ELP: 3.7 g/dL (ref 2.9–4.4)
Alpha-1-Globulin: 0.3 g/dL (ref 0.0–0.4)
Alpha-2-Globulin: 0.7 g/dL (ref 0.4–1.0)
Beta Globulin: 0.9 g/dL (ref 0.7–1.3)
Gamma Globulin: 0.3 g/dL — ABNORMAL LOW (ref 0.4–1.8)
Globulin, Total: 2.2 g/dL (ref 2.2–3.9)
Total Protein ELP: 5.9 g/dL — ABNORMAL LOW (ref 6.0–8.5)

## 2022-04-30 LAB — IMMUNOFIXATION ELECTROPHORESIS
IgA: 23 mg/dL — ABNORMAL LOW (ref 61–437)
IgG (Immunoglobin G), Serum: 313 mg/dL — ABNORMAL LOW (ref 603–1613)
IgM (Immunoglobulin M), Srm: 8 mg/dL — ABNORMAL LOW (ref 20–172)
Total Protein ELP: 6 g/dL (ref 6.0–8.5)

## 2022-05-01 ENCOUNTER — Other Ambulatory Visit: Payer: Self-pay

## 2022-05-01 MED ORDER — LENALIDOMIDE 10 MG PO CAPS: 10.0000 mg | ORAL_CAPSULE | Freq: Every day | ORAL | 0 refills | Status: DC

## 2022-05-01 NOTE — Telephone Encounter (Signed)
Chart reviewed. Revlimid refilled per last office note with Dr. Katragadda.  

## 2022-05-22 ENCOUNTER — Ambulatory Visit: Payer: Medicare Other | Admitting: Hematology

## 2022-05-22 ENCOUNTER — Inpatient Hospital Stay: Payer: Medicare Other | Attending: Hematology

## 2022-05-22 ENCOUNTER — Inpatient Hospital Stay: Payer: Medicare Other

## 2022-05-22 ENCOUNTER — Other Ambulatory Visit: Payer: Medicare Other

## 2022-05-22 ENCOUNTER — Ambulatory Visit: Payer: Medicare Other

## 2022-05-22 VITALS — BP 183/68 | HR 61 | Temp 98.0°F | Resp 16

## 2022-05-22 DIAGNOSIS — C9000 Multiple myeloma not having achieved remission: Secondary | ICD-10-CM | POA: Insufficient documentation

## 2022-05-22 DIAGNOSIS — Z5112 Encounter for antineoplastic immunotherapy: Secondary | ICD-10-CM | POA: Diagnosis not present

## 2022-05-22 DIAGNOSIS — Z79899 Other long term (current) drug therapy: Secondary | ICD-10-CM | POA: Diagnosis not present

## 2022-05-22 LAB — CBC WITH DIFFERENTIAL/PLATELET
Abs Immature Granulocytes: 0.03 10*3/uL (ref 0.00–0.07)
Basophils Absolute: 0.1 10*3/uL (ref 0.0–0.1)
Basophils Relative: 1 %
Eosinophils Absolute: 0.1 10*3/uL (ref 0.0–0.5)
Eosinophils Relative: 2 %
HCT: 42.9 % (ref 39.0–52.0)
Hemoglobin: 14 g/dL (ref 13.0–17.0)
Immature Granulocytes: 0 %
Lymphocytes Relative: 15 %
Lymphs Abs: 1 10*3/uL (ref 0.7–4.0)
MCH: 27 pg (ref 26.0–34.0)
MCHC: 32.6 g/dL (ref 30.0–36.0)
MCV: 82.8 fL (ref 80.0–100.0)
Monocytes Absolute: 1 10*3/uL (ref 0.1–1.0)
Monocytes Relative: 14 %
Neutro Abs: 4.5 10*3/uL (ref 1.7–7.7)
Neutrophils Relative %: 68 %
Platelets: 180 10*3/uL (ref 150–400)
RBC: 5.18 MIL/uL (ref 4.22–5.81)
RDW: 15.7 % — ABNORMAL HIGH (ref 11.5–15.5)
WBC: 6.7 10*3/uL (ref 4.0–10.5)
nRBC: 0 % (ref 0.0–0.2)

## 2022-05-22 LAB — COMPREHENSIVE METABOLIC PANEL
ALT: 16 U/L (ref 0–44)
AST: 20 U/L (ref 15–41)
Albumin: 4.3 g/dL (ref 3.5–5.0)
Alkaline Phosphatase: 65 U/L (ref 38–126)
Anion gap: 8 (ref 5–15)
BUN: 28 mg/dL — ABNORMAL HIGH (ref 8–23)
CO2: 28 mmol/L (ref 22–32)
Calcium: 8.6 mg/dL — ABNORMAL LOW (ref 8.9–10.3)
Chloride: 101 mmol/L (ref 98–111)
Creatinine, Ser: 1.15 mg/dL (ref 0.61–1.24)
GFR, Estimated: >60 mL/min (ref >60)
Glucose, Bld: 148 mg/dL — ABNORMAL HIGH (ref 70–99)
Potassium: 3.8 mmol/L (ref 3.5–5.1)
Sodium: 137 mmol/L (ref 135–145)
Total Bilirubin: 1.2 mg/dL (ref 0.3–1.2)
Total Protein: 7.1 g/dL (ref 6.5–8.1)

## 2022-05-22 MED ORDER — DARATUMUMAB-HYALURONIDASE-FIHJ 1800-30000 MG-UT/15ML ~~LOC~~ SOLN
1800.0000 mg | Freq: Once | SUBCUTANEOUS | Status: AC
  Administered 2022-05-22: 1800 mg via SUBCUTANEOUS
  Filled 2022-05-22: qty 15

## 2022-05-22 MED ORDER — DENOSUMAB 120 MG/1.7ML ~~LOC~~ SOLN
120.0000 mg | Freq: Once | SUBCUTANEOUS | Status: AC
  Administered 2022-05-22: 120 mg via SUBCUTANEOUS
  Filled 2022-05-22: qty 1.7

## 2022-05-22 MED ORDER — ACETAMINOPHEN 325 MG PO TABS
650.0000 mg | ORAL_TABLET | Freq: Once | ORAL | Status: AC
  Administered 2022-05-22: 650 mg via ORAL
  Filled 2022-05-22: qty 2

## 2022-05-22 MED ORDER — DEXAMETHASONE 4 MG PO TABS
40.0000 mg | ORAL_TABLET | Freq: Once | ORAL | Status: AC
  Administered 2022-05-22: 40 mg via ORAL
  Filled 2022-05-22: qty 10

## 2022-05-22 MED ORDER — DIPHENHYDRAMINE HCL 25 MG PO CAPS
50.0000 mg | ORAL_CAPSULE | Freq: Once | ORAL | Status: AC
  Administered 2022-05-22: 50 mg via ORAL
  Filled 2022-05-22: qty 2

## 2022-05-22 NOTE — Progress Notes (Signed)
Patient taking calcium as directed. Denied tooth, jaw, and leg pain. No recent or upcoming dental visits. Labs reviewed. Patient tolerated injection with no complaints voiced. See MAR for details. Patient stable during and after injection. Site clean and dry with no bruising or swelling noted. Band aid applied.  Patient tolerated Daratumumab injection with no complaints voiced. See MAR for details. Lab reviewed. Injection site clean and dry with no bruising or swelling noted at site. Band aid applied. Vss with discharge and left in satisfactory condition with nos/s of distress noted.

## 2022-05-22 NOTE — Patient Instructions (Signed)
Paul Norton  Discharge Instructions: Thank you for choosing [facility] to provide your oncology and hematology care.  If you have a lab appointment with the [facility], please come in thru the Main Entrance and check in at the main information desk.  Wear comfortable clothing and clothing appropriate for easy access to any Portacath or PICC line.   We strive to give you quality time with your provider. You may need to reschedule your appointment if you arrive late (15 or more minutes).  Arriving late affects you and other patients whose appointments are after yours.  Also, if you miss three or more appointments without notifying the office, you may be dismissed from the clinic at the provider's discretion.      For prescription refill requests, have your pharmacy contact our office and allow 72 hours for refills to be completed.    Today you received the following chemotherapy and/or immunotherapy agents Xgeva and Daratumumab, return as scheduled.   To help prevent nausea and vomiting after your treatment, we encourage you to take your nausea medication as directed.  BELOW ARE SYMPTOMS THAT SHOULD BE REPORTED IMMEDIATELY: *FEVER GREATER THAN 100.4 F (38 C) OR HIGHER *CHILLS OR SWEATING *NAUSEA AND VOMITING THAT IS NOT CONTROLLED WITH YOUR NAUSEA MEDICATION *UNUSUAL SHORTNESS OF BREATH *UNUSUAL BRUISING OR BLEEDING *URINARY PROBLEMS (pain or burning when urinating, or frequent urination) *BOWEL PROBLEMS (unusual diarrhea, constipation, pain near the anus) TENDERNESS IN MOUTH AND THROAT WITH OR WITHOUT PRESENCE OF ULCERS (sore throat, sores in mouth, or a toothache) UNUSUAL RASH, SWELLING OR PAIN  UNUSUAL VAGINAL DISCHARGE OR ITCHING   Items with * indicate a potential emergency and should be followed up as soon as possible or go to the Emergency Department if any problems should occur.  Please show the CHEMOTHERAPY ALERT CARD or IMMUNOTHERAPY ALERT  CARD at check-in to the Emergency Department and triage nurse.  Should you have questions after your visit or need to cancel or reschedule your appointment, please contact [facility] [phone]  and follow the prompts.  Office hours are 8:00 a.m. to 4:30 p.m. Monday - Friday. Please note that voicemails left after 4:00 p.m. may not be returned until the following business day.  We are closed weekends and major holidays. You have access to a nurse at all times for urgent questions. Please call the main number to the clinic [phone] and follow the prompts.  For any non-urgent questions, you may also contact your provider using MyChart. We now offer e-Visits for anyone 15 and older to request care online for non-urgent symptoms. For details visit [email or web address].   Also download the MyChart app! Go to the app store, search "MyChart", open the app, select [facility], and log in with your MyChart username and password.  Masks are optional in the cancer centers. If you would like for your care team to wear a mask while they are taking care of you, please let them know. You may have one support person who is at least 65 years old accompany you for your appointments.

## 2022-05-30 ENCOUNTER — Other Ambulatory Visit (HOSPITAL_COMMUNITY): Payer: Self-pay | Admitting: Hematology

## 2022-05-30 ENCOUNTER — Other Ambulatory Visit: Payer: Self-pay

## 2022-05-30 DIAGNOSIS — C9000 Multiple myeloma not having achieved remission: Secondary | ICD-10-CM

## 2022-05-30 MED ORDER — LENALIDOMIDE 10 MG PO CAPS: 10.0000 mg | ORAL_CAPSULE | Freq: Every day | ORAL | 0 refills | Status: DC

## 2022-05-30 NOTE — Telephone Encounter (Signed)
Chart reviewed. Revlimid refilled per last office note with Dr. Katragadda.  

## 2022-06-04 ENCOUNTER — Other Ambulatory Visit (HOSPITAL_COMMUNITY): Payer: Self-pay | Admitting: Hematology

## 2022-06-19 ENCOUNTER — Ambulatory Visit: Payer: Medicare Other | Admitting: Hematology

## 2022-06-19 ENCOUNTER — Inpatient Hospital Stay: Payer: Medicare Other

## 2022-06-19 ENCOUNTER — Inpatient Hospital Stay: Payer: Medicare Other | Attending: Hematology

## 2022-06-19 VITALS — BP 166/73 | HR 71 | Temp 98.5°F | Resp 18 | Wt 193.6 lb

## 2022-06-19 DIAGNOSIS — Z5112 Encounter for antineoplastic immunotherapy: Secondary | ICD-10-CM | POA: Insufficient documentation

## 2022-06-19 DIAGNOSIS — C9000 Multiple myeloma not having achieved remission: Secondary | ICD-10-CM

## 2022-06-19 DIAGNOSIS — Z79899 Other long term (current) drug therapy: Secondary | ICD-10-CM | POA: Insufficient documentation

## 2022-06-19 LAB — COMPREHENSIVE METABOLIC PANEL
ALT: 16 U/L (ref 0–44)
AST: 16 U/L (ref 15–41)
Albumin: 4 g/dL (ref 3.5–5.0)
Alkaline Phosphatase: 67 U/L (ref 38–126)
Anion gap: 12 (ref 5–15)
BUN: 28 mg/dL — ABNORMAL HIGH (ref 8–23)
CO2: 28 mmol/L (ref 22–32)
Calcium: 8.9 mg/dL (ref 8.9–10.3)
Chloride: 100 mmol/L (ref 98–111)
Creatinine, Ser: 1.2 mg/dL (ref 0.61–1.24)
GFR, Estimated: >60 mL/min (ref >60)
Glucose, Bld: 121 mg/dL — ABNORMAL HIGH (ref 70–99)
Potassium: 3.8 mmol/L (ref 3.5–5.1)
Sodium: 140 mmol/L (ref 135–145)
Total Bilirubin: 1.2 mg/dL (ref 0.3–1.2)
Total Protein: 6.4 g/dL — ABNORMAL LOW (ref 6.5–8.1)

## 2022-06-19 LAB — CBC WITH DIFFERENTIAL/PLATELET
Abs Immature Granulocytes: 0.04 10*3/uL (ref 0.00–0.07)
Basophils Absolute: 0.1 10*3/uL (ref 0.0–0.1)
Basophils Relative: 1 %
Eosinophils Absolute: 0.2 10*3/uL (ref 0.0–0.5)
Eosinophils Relative: 3 %
HCT: 43.5 % (ref 39.0–52.0)
Hemoglobin: 14.2 g/dL (ref 13.0–17.0)
Immature Granulocytes: 1 %
Lymphocytes Relative: 17 %
Lymphs Abs: 1.1 10*3/uL (ref 0.7–4.0)
MCH: 27.2 pg (ref 26.0–34.0)
MCHC: 32.6 g/dL (ref 30.0–36.0)
MCV: 83.3 fL (ref 80.0–100.0)
Monocytes Absolute: 1.2 10*3/uL — ABNORMAL HIGH (ref 0.1–1.0)
Monocytes Relative: 19 %
Neutro Abs: 3.9 10*3/uL (ref 1.7–7.7)
Neutrophils Relative %: 59 %
Platelets: 170 10*3/uL (ref 150–400)
RBC: 5.22 MIL/uL (ref 4.22–5.81)
RDW: 15.5 % (ref 11.5–15.5)
WBC: 6.4 10*3/uL (ref 4.0–10.5)
nRBC: 0 % (ref 0.0–0.2)

## 2022-06-19 MED ORDER — DEXAMETHASONE 4 MG PO TABS
40.0000 mg | ORAL_TABLET | Freq: Once | ORAL | Status: AC
  Administered 2022-06-19: 40 mg via ORAL
  Filled 2022-06-19: qty 10

## 2022-06-19 MED ORDER — ACETAMINOPHEN 325 MG PO TABS
650.0000 mg | ORAL_TABLET | Freq: Once | ORAL | Status: AC
  Administered 2022-06-19: 650 mg via ORAL
  Filled 2022-06-19: qty 2

## 2022-06-19 MED ORDER — DIPHENHYDRAMINE HCL 25 MG PO CAPS
50.0000 mg | ORAL_CAPSULE | Freq: Once | ORAL | Status: AC
  Administered 2022-06-19: 50 mg via ORAL
  Filled 2022-06-19: qty 2

## 2022-06-19 MED ORDER — DARATUMUMAB-HYALURONIDASE-FIHJ 1800-30000 MG-UT/15ML ~~LOC~~ SOLN
1800.0000 mg | Freq: Once | SUBCUTANEOUS | Status: AC
  Administered 2022-06-19: 1800 mg via SUBCUTANEOUS
  Filled 2022-06-19: qty 15

## 2022-06-19 MED ORDER — DENOSUMAB 120 MG/1.7ML ~~LOC~~ SOLN
120.0000 mg | Freq: Once | SUBCUTANEOUS | Status: AC
  Administered 2022-06-19: 120 mg via SUBCUTANEOUS
  Filled 2022-06-19: qty 1.7

## 2022-06-19 NOTE — Progress Notes (Signed)
Patient presents today for Dartumumab and Xgeva injections.  Patient is in satisfactory condition with no complaints voiced.  Vital signs are stable.  Labs reviewed. All labs are within treatment parameters.  We will proceed with injections per MD orders.   Patient tolerated injections well with no complaints voiced.  Patient left ambulatory in stable condition.  Vital signs stable at discharge.  Follow up as scheduled.

## 2022-06-19 NOTE — Patient Instructions (Signed)
[facility]  Discharge Instructions: Thank you for choosing [facility] to provide your oncology and hematology care.  If you have a lab appointment with the Cancer Center, please come in thru the Main Entrance and check in at the main information desk.  Wear comfortable clothing and clothing appropriate for easy access to any Portacath or PICC line.   We strive to give you quality time with your provider. You may need to reschedule your appointment if you arrive late (15 or more minutes).  Arriving late affects you and other patients whose appointments are after yours.  Also, if you miss three or more appointments without notifying the office, you may be dismissed from the clinic at the provider's discretion.      For prescription refill requests, have your pharmacy contact our office and allow 72 hours for refills to be completed.    Today you received the following chemotherapy and/or immunotherapy agents Daratumumab & Xgeva.   Denosumab Injection (Oncology) What is this medication? DENOSUMAB (den oh SUE mab) prevents weakened bones caused by cancer. It may also be used to treat noncancerous bone tumors that cannot be removed by surgery. It can also be used to treat high calcium levels in the blood caused by cancer. It works by blocking a protein that causes bones to break down quickly. This slows down the release of calcium from bones, which lowers calcium levels in your blood. It also makes your bones stronger and less likely to break (fracture). This medicine may be used for other purposes; ask your health care provider or pharmacist if you have questions. COMMON BRAND NAME(S): XGEVA What should I tell my care team before I take this medication? They need to know if you have any of these conditions: Dental disease Having surgery or tooth extraction Infection Kidney disease Low levels of calcium or vitamin D in the blood Malnutrition On  hemodialysis Skin conditions or sensitivity Thyroid or parathyroid disease An unusual reaction to denosumab, other medications, foods, dyes, or preservatives Pregnant or trying to get pregnant Breast-feeding How should I use this medication? This medication is for injection under the skin. It is given by your care team in a hospital or clinic setting. A special MedGuide will be given to you before each treatment. Be sure to read this information carefully each time. Talk to your care team about the use of this medication in children. While it may be prescribed for children as young as 13 years for selected conditions, precautions do apply. Overdosage: If you think you have taken too much of this medicine contact a poison control center or emergency room at once. NOTE: This medicine is only for you. Do not share this medicine with others. What if I miss a dose? Keep appointments for follow-up doses. It is important not to miss your dose. Call your care team if you are unable to keep an appointment. What may interact with this medication? Do not take this medication with any of the following: Other medications containing denosumab This medication may also interact with the following: Medications that lower your chance of fighting infection Steroid medications, such as prednisone or cortisone This list may not describe all possible interactions. Give your health care provider a list of all the medicines, herbs, non-prescription drugs, or dietary supplements you use. Also tell them if you smoke, drink alcohol, or use illegal drugs. Some items may interact with your medicine. What should I watch for while using this medication?   be monitored carefully while you are receiving this medication. You may need blood work while taking this medication. This medication may increase your risk of getting an infection. Call your care team for advice if you get a fever, chills, sore throat,  or other symptoms of a cold or flu. Do not treat yourself. Try to avoid being around people who are sick. You should make sure you get enough calcium and vitamin D while you are taking this medication, unless your care team tells you not to. Discuss the foods you eat and the vitamins you take with your care team. Some people who take this medication have severe bone, joint, or muscle pain. This medication may also increase your risk for jaw problems or a broken thigh bone. Tell your care team right away if you have severe pain in your jaw, bones, joints, or muscles. Tell your care team if you have any pain that does not go away or that gets worse. Talk to your care team if you may be pregnant. Serious birth defects can occur if you take this medication during pregnancy and for 5 months after the last dose. You will need a negative pregnancy test before starting this medication. Contraception is recommended while taking this medication and for 5 months after the last dose. Your care team can help you find the option that works for you. What side effects may I notice from receiving this medication? Side effects that you should report to your care team as soon as possible: Allergic reactions--skin rash, itching, hives, swelling of the face, lips, tongue, or throat Bone, joint, or muscle pain Low calcium level--muscle pain or cramps, confusion, tingling, or numbness in the hands or feet Osteonecrosis of the jaw--pain, swelling, or redness in the mouth, numbness of the jaw, poor healing after dental work, unusual discharge from the mouth, visible bones in the mouth Side effects that usually do not require medical attention (report to your care team if they continue or are bothersome): Cough Diarrhea Fatigue Headache Nausea This list may not describe all possible side effects. Call your doctor for medical advice about side effects. You may report side effects to FDA at 1-800-FDA-1088. Where should I keep  my medication? This medication is given in a hospital or clinic. It will not be stored at home. NOTE: This sheet is a summary. It may not cover all possible information. If you have questions about this medicine, talk to your doctor, pharmacist, or health care provider.  2023 Elsevier/Gold Standard (2021-11-20 00:00:00)    Daratumumab Injection What is this medication? DARATUMUMAB (dar a toom ue mab) treats multiple myeloma, a type of bone marrow cancer. It works by helping your immune system slow or stop the spread of cancer cells. It is a monoclonal antibody. This medicine may be used for other purposes; ask your health care provider or pharmacist if you have questions. COMMON BRAND NAME(S): DARZALEX What should I tell my care team before I take this medication? They need to know if you have any of these conditions: Hereditary fructose intolerance Infection, such as chickenpox, herpes, hepatitis B virus Lung or breathing disease, such as asthma, COPD An unusual or allergic reaction to daratumumab, sorbitol, other medications, foods, dyes, or preservatives Pregnant or trying to get pregnant Breast-feeding How should I use this medication? This medication is injected into a vein. It is given by your care team in a hospital or clinic setting. Talk to your care team about the use of this medication in  children. Special care may be needed. Overdosage: If you think you have taken too much of this medicine contact a poison control center or emergency room at once. NOTE: This medicine is only for you. Do not share this medicine with others. What if I miss a dose? Keep appointments for follow-up doses. It is important not to miss your dose. Call your care team if you are unable to keep an appointment. What may interact with this medication? Interactions have not been studied. This list may not describe all possible interactions. Give your health care provider a list of all the medicines,  herbs, non-prescription drugs, or dietary supplements you use. Also tell them if you smoke, drink alcohol, or use illegal drugs. Some items may interact with your medicine. What should I watch for while using this medication? Your condition will be monitored carefully while you are receiving this medication. This medication can cause serious allergic reactions. To reduce your risk, your care team may give you other medication to take before receiving this one. Be sure to follow the directions from your care team. This medication can affect the results of blood tests to match your blood type. These changes can last for up to 6 months after the final dose. Your care team will do blood tests to match your blood type before you start treatment. Tell all of your care team that you are being treated with this medication before receiving a blood transfusion. This medication can affect the results of some tests used to determine treatment response; extra tests may be needed to evaluate response. Talk to your care team if you wish to become pregnant or think you are pregnant. This medication can cause serious birth defects if taken during pregnancy and for 3 months after the last dose. A reliable form of contraception is recommended while taking this medication and for 3 months after the last dose. Talk to your care team about effective forms of contraception. Do not breast-feed while taking this medication. What side effects may I notice from receiving this medication? Side effects that you should report to your care team as soon as possible: Allergic reactions--skin rash, itching, hives, swelling of the face, lips, tongue, or throat Infection--fever, chills, cough, sore throat, wounds that don't heal, pain or trouble when passing urine, general feeling of discomfort or being unwell Infusion reactions--chest pain, shortness of breath or trouble breathing, feeling faint or lightheaded Unusual bruising or  bleeding Side effects that usually do not require medical attention (report to your care team if they continue or are bothersome): Constipation Diarrhea Fatigue Nausea Pain, tingling, or numbness in the hands or feet Swelling of the ankles, hands, or feet This list may not describe all possible side effects. Call your doctor for medical advice about side effects. You may report side effects to FDA at 1-800-FDA-1088. Where should I keep my medication? This medication is given in a hospital or clinic. It will not be stored at home. NOTE: This sheet is a summary. It may not cover all possible information. If you have questions about this medicine, talk to your doctor, pharmacist, or health care provider.  2023 Elsevier/Gold Standard (2021-10-25 00:00:00)        To help prevent nausea and vomiting after your treatment, we encourage you to take your nausea medication as directed.  BELOW ARE SYMPTOMS THAT SHOULD BE REPORTED IMMEDIATELY: *FEVER GREATER THAN 100.4 F (38 C) OR HIGHER *CHILLS OR SWEATING *NAUSEA AND VOMITING THAT IS NOT CONTROLLED WITH YOUR NAUSEA  MEDICATION *UNUSUAL SHORTNESS OF BREATH *UNUSUAL BRUISING OR BLEEDING *URINARY PROBLEMS (pain or burning when urinating, or frequent urination) *BOWEL PROBLEMS (unusual diarrhea, constipation, pain near the anus) TENDERNESS IN MOUTH AND THROAT WITH OR WITHOUT PRESENCE OF ULCERS (sore throat, sores in mouth, or a toothache) UNUSUAL RASH, SWELLING OR PAIN  UNUSUAL VAGINAL DISCHARGE OR ITCHING   Items with * indicate a potential emergency and should be followed up as soon as possible or go to the Emergency Department if any problems should occur.  Please show the CHEMOTHERAPY ALERT CARD or IMMUNOTHERAPY ALERT CARD at check-in to the Emergency Department and triage nurse.  Should you have questions after your visit or need to cancel or reschedule your appointment, please contact [facility] [phone]  and  follow the prompts.  Office hours are 8:00 a.m. to 4:30 p.m. Monday - Friday. Please note that voicemails left after 4:00 p.m. may not be returned until the following business day.  We are closed weekends and major holidays. You have access to a nurse at all times for urgent questions. Please call the main number to the clinic [phone] and follow the prompts.  For any non-urgent questions, you may also contact your provider using MyChart. We now offer e-Visits for anyone 83 and older to request care online for non-urgent symptoms. For details visit [email or web address].   Also download the MyChart app! Go to the app store, search "MyChart", open the app, select [facility], and log in with your MyChart username and password.  Masks are optional in the cancer centers. If you would like for your care team to wear a mask while they are taking care of you, please let them know. You may have one support person who is at least 65 years old accompany you for your appointments.

## 2022-06-27 ENCOUNTER — Other Ambulatory Visit: Payer: Self-pay

## 2022-06-27 MED ORDER — LENALIDOMIDE 10 MG PO CAPS: 10.0000 mg | ORAL_CAPSULE | Freq: Every day | ORAL | 0 refills | Status: DC

## 2022-06-27 NOTE — Telephone Encounter (Signed)
Chart reviewed. Revlimid refilled per last office note with Dr. Katragadda.  

## 2022-06-28 ENCOUNTER — Other Ambulatory Visit: Payer: Self-pay

## 2022-07-17 ENCOUNTER — Inpatient Hospital Stay: Payer: 59 | Attending: Hematology

## 2022-07-17 ENCOUNTER — Inpatient Hospital Stay: Payer: 59

## 2022-07-17 ENCOUNTER — Inpatient Hospital Stay (HOSPITAL_BASED_OUTPATIENT_CLINIC_OR_DEPARTMENT_OTHER): Payer: 59 | Admitting: Hematology

## 2022-07-17 DIAGNOSIS — Z5112 Encounter for antineoplastic immunotherapy: Secondary | ICD-10-CM | POA: Insufficient documentation

## 2022-07-17 DIAGNOSIS — Z79899 Other long term (current) drug therapy: Secondary | ICD-10-CM | POA: Diagnosis not present

## 2022-07-17 DIAGNOSIS — C9000 Multiple myeloma not having achieved remission: Secondary | ICD-10-CM

## 2022-07-17 LAB — CBC WITH DIFFERENTIAL/PLATELET
Abs Immature Granulocytes: 0.04 10*3/uL (ref 0.00–0.07)
Basophils Absolute: 0.1 10*3/uL (ref 0.0–0.1)
Basophils Relative: 1 %
Eosinophils Absolute: 0.1 10*3/uL (ref 0.0–0.5)
Eosinophils Relative: 2 %
HCT: 43 % (ref 39.0–52.0)
Hemoglobin: 14.1 g/dL (ref 13.0–17.0)
Immature Granulocytes: 1 %
Lymphocytes Relative: 11 %
Lymphs Abs: 0.8 10*3/uL (ref 0.7–4.0)
MCH: 27.4 pg (ref 26.0–34.0)
MCHC: 32.8 g/dL (ref 30.0–36.0)
MCV: 83.7 fL (ref 80.0–100.0)
Monocytes Absolute: 1 10*3/uL (ref 0.1–1.0)
Monocytes Relative: 14 %
Neutro Abs: 5.1 10*3/uL (ref 1.7–7.7)
Neutrophils Relative %: 71 %
Platelets: 166 10*3/uL (ref 150–400)
RBC: 5.14 MIL/uL (ref 4.22–5.81)
RDW: 15.7 % — ABNORMAL HIGH (ref 11.5–15.5)
WBC: 7.1 10*3/uL (ref 4.0–10.5)
nRBC: 0 % (ref 0.0–0.2)

## 2022-07-17 LAB — COMPREHENSIVE METABOLIC PANEL
ALT: 18 U/L (ref 0–44)
AST: 20 U/L (ref 15–41)
Albumin: 4.1 g/dL (ref 3.5–5.0)
Alkaline Phosphatase: 66 U/L (ref 38–126)
Anion gap: 10 (ref 5–15)
BUN: 26 mg/dL — ABNORMAL HIGH (ref 8–23)
CO2: 29 mmol/L (ref 22–32)
Calcium: 8.5 mg/dL — ABNORMAL LOW (ref 8.9–10.3)
Chloride: 98 mmol/L (ref 98–111)
Creatinine, Ser: 1.17 mg/dL (ref 0.61–1.24)
GFR, Estimated: >60 mL/min (ref >60)
Glucose, Bld: 133 mg/dL — ABNORMAL HIGH (ref 70–99)
Potassium: 4.1 mmol/L (ref 3.5–5.1)
Sodium: 137 mmol/L (ref 135–145)
Total Bilirubin: 1.5 mg/dL — ABNORMAL HIGH (ref 0.3–1.2)
Total Protein: 6.4 g/dL — ABNORMAL LOW (ref 6.5–8.1)

## 2022-07-17 MED ORDER — DARATUMUMAB-HYALURONIDASE-FIHJ 1800-30000 MG-UT/15ML ~~LOC~~ SOLN
1800.0000 mg | Freq: Once | SUBCUTANEOUS | Status: AC
  Administered 2022-07-17: 1800 mg via SUBCUTANEOUS
  Filled 2022-07-17: qty 15

## 2022-07-17 MED ORDER — DENOSUMAB 120 MG/1.7ML ~~LOC~~ SOLN
120.0000 mg | Freq: Once | SUBCUTANEOUS | Status: AC
  Administered 2022-07-17: 120 mg via SUBCUTANEOUS
  Filled 2022-07-17: qty 1.7

## 2022-07-17 MED ORDER — DEXAMETHASONE 4 MG PO TABS
40.0000 mg | ORAL_TABLET | Freq: Once | ORAL | Status: AC
  Administered 2022-07-17: 40 mg via ORAL
  Filled 2022-07-17: qty 10

## 2022-07-17 MED ORDER — LORATADINE 10 MG PO TABS
10.0000 mg | ORAL_TABLET | Freq: Once | ORAL | Status: AC
  Administered 2022-07-17: 10 mg via ORAL
  Filled 2022-07-17: qty 1

## 2022-07-17 MED ORDER — ACETAMINOPHEN 325 MG PO TABS
650.0000 mg | ORAL_TABLET | Freq: Once | ORAL | Status: AC
  Administered 2022-07-17: 650 mg via ORAL
  Filled 2022-07-17: qty 2

## 2022-07-17 MED ORDER — DIPHENHYDRAMINE HCL 25 MG PO CAPS: 50.0000 mg | ORAL_CAPSULE | Freq: Once | ORAL | Status: DC

## 2022-07-17 NOTE — Progress Notes (Signed)
OK with Dr Delton Coombes for no blood pressure reading today.  Patient has some panic issues with taking of BP.  Discontinue diphenhydramine in premedication and change to Loratidine 10 mg po x 1 as premedication for Darzalex Faspro.  T.O. Dr Rhys Martini, PharmD

## 2022-07-17 NOTE — Patient Instructions (Signed)
[facility]  Discharge Instructions: Thank you for choosing [facility] to provide your oncology and hematology care.  If you have a lab appointment with the [facility], please come in thru the Main Entrance and check in at the main information desk.  Wear comfortable clothing and clothing appropriate for easy access to any Portacath or PICC line.   We strive to give you quality time with your provider. You may need to reschedule your appointment if you arrive late (15 or more minutes).  Arriving late affects you and other patients whose appointments are after yours.  Also, if you miss three or more appointments without notifying the office, you may be dismissed from the clinic at the provider's discretion.      For prescription refill requests, have your pharmacy contact our office and allow 72 hours for refills to be completed.    Today you received the following chemotherapy and/or immunotherapy agents Dara [place] and Xgeva   To help prevent nausea and vomiting after your treatment, we encourage you to take your nausea medication as directed.  Daratumumab; Hyaluronidase Injection What is this medication? DARATUMUMAB; HYALURONIDASE (dar a toom ue mab; hye al ur ON i dase) treats multiple myeloma, a type of bone marrow cancer. Daratumumab works by blocking a protein that causes cancer cells to grow and multiply. This helps to slow or stop the spread of cancer cells. Hyaluronidase works by increasing the absorption of other medications in the body to help them work better. This medication may also be used treat amyloidosis, a condition that causes the buildup of a protein (amyloid) in your body. It works by reducing the buildup of this protein, which decreases symptoms. It is a combination medication that contains a monoclonal antibody. This medicine may be used for other purposes; ask your health care provider or pharmacist if you have questions. COMMON BRAND  NAME(S): DARZALEX FASPRO What should I tell my care team before I take this medication? They need to know if you have any of these conditions: Heart disease Infection, such as chickenpox, cold sores, herpes, hepatitis B Lung or breathing disease An unusual or allergic reaction to daratumumab, hyaluronidase, other medications, foods, dyes, or preservatives Pregnant or trying to get pregnant Breast-feeding How should I use this medication? This medication is injected under the skin. It is given by your care team in a hospital or clinic setting. Talk to your care team about the use of this medication in children. Special care may be needed. Overdosage: If you think you have taken too much of this medicine contact a poison control center or emergency room at once. NOTE: This medicine is only for you. Do not share this medicine with others. What if I miss a dose? Keep appointments for follow-up doses. It is important not to miss your dose. Call your care team if you are unable to keep an appointment. What may interact with this medication? Interactions have not been studied. This list may not describe all possible interactions. Give your health care provider a list of all the medicines, herbs, non-prescription drugs, or dietary supplements you use. Also tell them if you smoke, drink alcohol, or use illegal drugs. Some items may interact with your medicine. What should I watch for while using this medication? Your condition will be monitored carefully while you are receiving this medication. This medication can cause serious allergic reactions. To reduce your risk, your care team may give you other medication to take before  receiving this one. Be sure to follow the directions from your care team. This medication can affect the results of blood tests to match your blood type. These changes can last for up to 6 months after the final dose. Your care team will do blood tests to match your blood type  before you start treatment. Tell all of your care team that you are being treated with this medication before receiving a blood transfusion. This medication can affect the results of some tests used to determine treatment response; extra tests may be needed to evaluate response. Talk to your care team if you wish to become pregnant or think you are pregnant. This medication can cause serious birth defects if taken during pregnancy and for 3 months after the last dose. A reliable form of contraception is recommended while taking this medication and for 3 months after the last dose. Talk to your care team about effective forms of contraception. Do not breast-feed while taking this medication. What side effects may I notice from receiving this medication? Side effects that you should report to your care team as soon as possible: Allergic reactions--skin rash, itching, hives, swelling of the face, lips, tongue, or throat Heart rhythm changes--fast or irregular heartbeat, dizziness, feeling faint or lightheaded, chest pain, trouble breathing Infection--fever, chills, cough, sore throat, wounds that don't heal, pain or trouble when passing urine, general feeling of discomfort or being unwell Infusion reactions--chest pain, shortness of breath or trouble breathing, feeling faint or lightheaded Sudden eye pain or change in vision such as blurry vision, seeing halos around lights, vision loss Unusual bruising or bleeding Side effects that usually do not require medical attention (report to your care team if they continue or are bothersome): Constipation Diarrhea Fatigue Nausea Pain, tingling, or numbness in the hands or feet Swelling of the ankles, hands, or feet This list may not describe all possible side effects. Call your doctor for medical advice about side effects. You may report side effects to FDA at 1-800-FDA-1088. Where should I keep my medication? This medication is given in a hospital or  clinic. It will not be stored at home. NOTE: This sheet is a summary. It may not cover all possible information. If you have questions about this medicine, talk to your doctor, pharmacist, or health care provider.  2023 Elsevier/Gold Standard (2021-10-25 00:00:00)   BELOW ARE SYMPTOMS THAT SHOULD BE REPORTED IMMEDIATELY: *FEVER GREATER THAN 100.4 F (38 C) OR HIGHER *CHILLS OR SWEATING *NAUSEA AND VOMITING THAT IS NOT CONTROLLED WITH YOUR NAUSEA MEDICATION *UNUSUAL SHORTNESS OF BREATH *UNUSUAL BRUISING OR BLEEDING *URINARY PROBLEMS (pain or burning when urinating, or frequent urination) *BOWEL PROBLEMS (unusual diarrhea, constipation, pain near the anus) TENDERNESS IN MOUTH AND THROAT WITH OR WITHOUT PRESENCE OF ULCERS (sore throat, sores in mouth, or a toothache) UNUSUAL RASH, SWELLING OR PAIN  UNUSUAL VAGINAL DISCHARGE OR ITCHING   Items with * indicate a potential emergency and should be followed up as soon as possible or go to the Emergency Department if any problems should occur.  Please show the CHEMOTHERAPY ALERT CARD or IMMUNOTHERAPY ALERT CARD at check-in to the Emergency Department and triage nurse.  Should you have questions after your visit or need to cancel or reschedule your appointment, please contact [facility] [phone]  and follow the prompts.  Office hours are 8:00 a.m. to 4:30 p.m. Monday - Friday. Please note that voicemails left after 4:00 p.m. may not be returned until the following business day.  We are closed weekends and major holidays. You have access to a nurse at all times for urgent questions. Please call the main number to the clinic [phone] and follow the prompts.  For any non-urgent questions, you may also contact your provider using MyChart. We now offer e-Visits for anyone 77 and older to request care online for non-urgent symptoms. For details visit [email or web address].   Also download the MyChart app! Go to the app  store, search "MyChart", open the app, select [facility], and log in with your MyChart username and password.

## 2022-07-17 NOTE — Progress Notes (Signed)
Patient is taking Revlimid as prescribed.  He has not missed any doses and reports no side effects at this time.    Patient has been examined by Dr. Delton Coombes, and vital signs and labs have been reviewed. ANC, Creatinine, LFTs (bilirubin 1.5), hemoglobin, and platelets are within treatment parameters per M.D. - pt may proceed with treatment.  Primary RN and pharmacy notified.

## 2022-07-17 NOTE — Progress Notes (Addendum)
Paul Norton, [place] [address]   CLINIC:  Medical Oncology/Hematology  PCP:  Carlena Hurl, PA-C [address] / [place] [address] [phone]   REASON FOR VISIT:  Follow-up for multiple myeloma  PRIOR THERAPY: Dara CyBorD  NGS Results: not done  CURRENT THERAPY: Maintenance Revlimid and daratumumab  BRIEF ONCOLOGIC HISTORY:  Oncology History  Multiple myeloma not having achieved remission ([place])  11/01/2020 Initial Diagnosis   Multiple myeloma not having achieved remission ([place])   11/02/2020 - 11/02/2020 Chemotherapy         11/16/2020 -  Chemotherapy   Patient is on Treatment Plan : MYELOMA NEWLY DIAGNOSED TRANSPLANT CANDIDATE DaraVRd (Daratumumab SQ) q21d x 6 Cycles (Induction/Consolidation)     11/21/2020 - 02/27/2022 Chemotherapy   Patient is on Treatment Plan : MYELOMA NEWLY DIAGNOSED TRANSPLANT CANDIDATE DaraVRd (Daratumumab SQ) q21d x 6 Cycles (Induction/Consolidation)     04/24/2021 - 04/24/2021 Chemotherapy   Patient is on Treatment Plan : MYELOMA NEWLY DIAGNOSED TRANSPLANT CANDIDATE Daratumumab SQ + Lenalidomide q28d (Maintenance)       CANCER STAGING:  Cancer Staging  No matching staging information was found for the patient.  INTERVAL HISTORY:  Paul Norton, a 67 y.o. male, seen for follow-up of multiple myeloma and toxicity assessment prior to next Darzalex injection.  Reports energy levels of 75%.  Reports that he is taking Revlimid without fail.  No side effects from Revlimid noted.  No new onset pains.  REVIEW OF SYSTEMS:  Review of Systems  All other systems reviewed and are negative.   PAST MEDICAL/SURGICAL HISTORY:  Past Medical History:  Diagnosis Date   Broken leg    Colonoscopy refused 09/2017   Hyperlipidemia    Hypertension    Multiple myeloma ([place])    Pneumonia    Refuses treatment 09/2017   refuses screenings such as cancer screening, refuses vaccines, refuses normal  preventative care   Vaccine refused by patient    all vaccines as of 09/2017   Past Surgical History:  Procedure Laterality Date   COLONOSCOPY     never, declines as of 09/2017    SOCIAL HISTORY:  Social History   Socioeconomic History   Marital status: Divorced    Spouse name: Not on file   Number of children: 1   Years of education: Not on file   Highest education level: Not on file  Occupational History   Occupation: Disability  Tobacco Use   Smoking status: Never   Smokeless tobacco: Never  Substance and Sexual Activity   Alcohol use: No   Drug use: Never   Sexual activity: Not Currently  Other Topics Concern   Not on file  Social History Narrative   Not on file   Social Determinants of Health   Financial Resource Strain: Medium Risk (11/15/2020)   Overall Financial Resource Strain (CARDIA)    Difficulty of Paying Living Expenses: Somewhat hard  Food Insecurity: No Food Insecurity (11/15/2020)   Hunger Vital Sign    Worried About Running Out of Food in the Last Year: Never true    [place] in the Last Year: Never true  Transportation Needs: No Transportation Needs (11/15/2020)   PRAPARE - Hydrologist (Medical): No    Lack of Transportation (Non-Medical): No  Physical Activity: Insufficiently Active (11/21/2020)   Exercise Vital Sign    Days of Exercise per Week: 5 days    Minutes of Exercise  per Session: 20 min  Stress: No Stress Concern Present (11/21/2020)   [place]    Feeling of Stress : Not at all  Social Connections: Socially Isolated (11/21/2020)   Social Connection and Isolation Panel [NHANES]    Frequency of Communication with Friends and Family: More than three times a week    Frequency of Social Gatherings with Friends and Family: More than three times a week    Attends Religious Services: Never    Marine scientist or Organizations: No    Attends  Archivist Meetings: Never    Marital Status: Divorced  Human resources officer Violence: Not At Risk (11/21/2020)   Humiliation, Afraid, Rape, and Kick questionnaire    Fear of Current or Ex-Partner: No    Emotionally Abused: No    Physically Abused: No    Sexually Abused: No    FAMILY HISTORY:  No family history on file.  CURRENT MEDICATIONS:  Current Outpatient Medications  Medication Sig Dispense Refill   acetaminophen (TYLENOL) 325 MG tablet Take 2 tablets (650 mg total) by mouth every 6 (six) hours as needed for mild pain (or Fever >/= 101). 30 tablet 30   amLODipine (NORVASC) 10 MG tablet TAKE ONE TABLET BY MOUTH ONCE DAILY 30 tablet 3   aspirin 81 MG chewable tablet Chew by mouth daily.     Calcium Carb-Cholecalciferol (OYSTER SHELL CALCIUM W/D) 500-5 MG-MCG TABS TAKE ONE TABLET BY MOUTH DAILY 30 tablet 3   feeding supplement (ENSURE ENLIVE / ENSURE PLUS) LIQD Take 237 mLs by mouth 3 (three) times daily between meals. 237 mL 12   lenalidomide (REVLIMID) 10 MG capsule Take 1 capsule (10 mg total) by mouth daily. 21 days on, 7 days off 21 capsule 0   No current facility-administered medications for this visit.    ALLERGIES:  No Known Allergies  PHYSICAL EXAM:  Performance status (ECOG): 1 - Symptomatic but completely ambulatory  Vitals:   07/17/22 1107  Pulse: 68  Resp: 16  Temp: 98.3 F (36.8 C)  SpO2: 99%   Wt Readings from Last 3 Encounters:  07/17/22 192 lb 14.4 oz (87.5 kg)  06/19/22 193 lb 9.6 oz (87.8 kg)  04/24/22 193 lb 12.6 oz (87.9 kg)   Physical Exam Vitals reviewed.  Constitutional:      Appearance: Normal appearance.  Cardiovascular:     Rate and Rhythm: Normal rate and regular rhythm.     Pulses: Normal pulses.     Heart sounds: Normal heart sounds.  Pulmonary:     Effort: Pulmonary effort is normal.     Breath sounds: Normal breath sounds.  Neurological:     General: No focal deficit present.     Mental Status: He is alert and oriented  to person, place, and time.  Psychiatric:        Mood and Affect: Mood normal.        Behavior: Behavior normal.    LABORATORY DATA:  I have reviewed the labs as listed.     Latest Ref Rng & Units 07/17/2022   10:35 AM 06/19/2022    8:23 AM 05/22/2022   12:37 PM  CBC  WBC 4.0 - 10.5 K/uL 7.1  6.4  6.7   Hemoglobin 13.0 - 17.0 g/dL 14.1  14.2  14.0   Hematocrit 39.0 - 52.0 % 43.0  43.5  42.9   Platelets 150 - 400 K/uL 166  170  180  Latest Ref Rng & Units 07/17/2022   10:35 AM 06/19/2022    8:23 AM 05/22/2022   12:37 PM  CMP  Glucose 70 - 99 mg/dL 133  121  148   BUN 8 - 23 mg/dL _0 Creatinine 0.61 - 1.24 mg/dL 1.17  1.20  1.15   Sodium 135 - 145 mmol/L 137  140  137   Potassium 3.5 - 5.1 mmol/L 4.1  3.8  3.8   Chloride 98 - 111 mmol/L 98  100  101   CO2 22 - 32 mmol/L _1 Calcium 8.9 - 10.3 mg/dL 8.5  8.9  8.6   Total Protein 6.5 - 8.1 g/dL 6.4  6.4  7.1   Total Bilirubin 0.3 - 1.2 mg/dL 1.5  1.2  1.2   Alkaline Phos 38 - 126 U/L 66  67  65   AST 15 - 41 U/L _2 ALT 0 - 44 U/L _3 DIAGNOSTIC IMAGING:  I have independently reviewed the scans and discussed with the patient. No results found.   ASSESSMENT:  1.  IgG kappa light chain multiple myeloma: - Admission with hypercalcemia and renal failure. - CT CAP showed extensive lytic foci throughout the axial and appendicular skeleton of the chest, abdomen and pelvis.  Splenomegaly with an enlarged lobular spleen, nonspecific. - Bone marrow biopsy on 11/01/2020 shows 85% atypical plasma cells in the aspirate. - FISH panel positive for 1 p-,-13,14q-,16q-,17p-/-17 and 20q- - Cytogenetics-no metaphases available for analysis. - SPEP with 5.4 g of M spike.  Free light chain ratio 1030.  LDH normal.  Beta-2 microglobulin 18.7.  24-hour urine total protein 3.6 g. - 6 cycles of Dara VRD from 11/21/2020 through 03/07/2021. - Patient declined bone marrow transplant. - Maintenance Revlimid and  monthly daratumumab started on 03/27/2021.   2.  Social/family history: - He worked as a Administrator.  He lives by himself. - Mother with breast and ovarian cancer.  Father had metastatic kidney cancer.  His brother's daughter has CML.   PLAN:  1.  IgG kappa light chain multiple myeloma, high risk: - He is tolerating Revlimid reasonably well. - Reviewed myeloma panel from 04/24/2022 with M spike not observed.  Free light chain ratio is 2.29 with kappa light chains 9.4.  Immunofixation was negative. - Reviewed labs from today which showed normal LFTs, creatinine and calcium level.  CBC was grossly normal. - We have sent myeloma panel from today which is pending. - Continue daratumumab today and every 4 weeks.  Continue Revlimid 10 mg 3 weeks on/1 week off.  RTC 3 months for follow-up with repeat labs.   2.  Myeloma bone disease: - Continue calcium and vitamin D supplements. - Continue denosumab today and monthly.  3.  ID prophylaxis: - Continue acyclovir twice daily and aspirin 81 mg daily.   Orders placed this encounter:  No orders of the defined types were placed in this encounter.     Derek Jack, MD [facility] [phone]

## 2022-07-17 NOTE — Patient Instructions (Signed)
[facility] at [facility] Discharge Instructions   You were seen and examined today by Dr. Katragadda.  He reviewed the results of your lab work which are normal/stable.   We will proceed with your treatment today.  Return as scheduled.    Thank you for choosing [facility] at [facility] to provide your oncology and hematology care.  To afford each patient quality time with our provider, please arrive at least 15 minutes before your scheduled appointment time.   If you have a lab appointment with the Cancer Center please come in thru the Main Entrance and check in at the main information desk.  You need to re-schedule your appointment should you arrive 10 or more minutes late.  We strive to give you quality time with our providers, and arriving late affects you and other patients whose appointments are after yours.  Also, if you no show three or more times for appointments you may be dismissed from the clinic at the providers discretion.     Again, thank you for choosing [facility].  Our hope is that these requests will decrease the amount of time that you wait before being seen by our physicians.       _____________________________________________________________  Should you have questions after your visit to [facility], please contact our office at [phone] and follow the prompts.  Our office hours are 8:00 a.m. and 4:30 p.m. Monday - Friday.  Please note that voicemails left after 4:00 p.m. may not be returned until the following business day.  We are closed weekends and major holidays.  You do have access to a nurse 24-7, just call the main number to the clinic [phone] and do not press any options, hold on the line and a nurse will answer the phone.    For prescription refill requests, have your pharmacy contact our office and allow 72 hours.    Due to Covid, you will need to wear a mask upon entering  the hospital. If you do not have a mask, a mask will be given to you at the Main Entrance upon arrival. For doctor visits, patients may have 1 support person age 18 or older with them. For treatment visits, patients can not have anyone with them due to social distancing guidelines and our immunocompromised population.      

## 2022-07-17 NOTE — Progress Notes (Signed)
Pt presents today for Paul Norton and Xgeva per provider's order. Vital signs and labs WNL for treatment. Okay to proceed with treatment today per Dr.K.  Pt denies tooth or jaw pain and no recent or future dental appointments at this time. Pt reports taking Calcium and Vit D supplements as directed.  Paul Norton and Paul Norton given today per MD orders. Tolerated infusion without adverse affects. Vital signs stable. No complaints at this time. Discharged from clinic ambulatory in stable condition. Alert and oriented x 3. F/U with [facility] as scheduled.

## 2022-07-18 ENCOUNTER — Other Ambulatory Visit: Payer: Self-pay

## 2022-07-18 LAB — KAPPA/LAMBDA LIGHT CHAINS
Kappa free light chain: 8.7 mg/L (ref 3.3–19.4)
Kappa, lambda light chain ratio: 2.23 — ABNORMAL HIGH (ref 0.26–1.65)
Lambda free light chains: 3.9 mg/L — ABNORMAL LOW (ref 5.7–26.3)

## 2022-07-19 ENCOUNTER — Other Ambulatory Visit: Payer: Self-pay

## 2022-07-19 LAB — PROTEIN ELECTROPHORESIS, SERUM
A/G Ratio: 1.9 — ABNORMAL HIGH (ref 0.7–1.7)
Albumin ELP: 3.8 g/dL (ref 2.9–4.4)
Alpha-1-Globulin: 0.2 g/dL (ref 0.0–0.4)
Alpha-2-Globulin: 0.7 g/dL (ref 0.4–1.0)
Beta Globulin: 0.9 g/dL (ref 0.7–1.3)
Gamma Globulin: 0.3 g/dL — ABNORMAL LOW (ref 0.4–1.8)
Globulin, Total: 2 g/dL — ABNORMAL LOW (ref 2.2–3.9)
M-Spike, %: 0.1 g/dL — ABNORMAL HIGH
Total Protein ELP: 5.8 g/dL — ABNORMAL LOW (ref 6.0–8.5)

## 2022-07-20 ENCOUNTER — Other Ambulatory Visit: Payer: Self-pay

## 2022-07-20 LAB — IMMUNOFIXATION ELECTROPHORESIS
IgA: 25 mg/dL — ABNORMAL LOW (ref 61–437)
IgG (Immunoglobin G), Serum: 316 mg/dL — ABNORMAL LOW (ref 603–1613)
IgM (Immunoglobulin M), Srm: 5 mg/dL — ABNORMAL LOW (ref 20–172)
Total Protein ELP: 5.8 g/dL — ABNORMAL LOW (ref 6.0–8.5)

## 2022-07-20 MED ORDER — LENALIDOMIDE 10 MG PO CAPS: 10.0000 mg | ORAL_CAPSULE | Freq: Every day | ORAL | 0 refills | Status: DC

## 2022-07-20 NOTE — Telephone Encounter (Signed)
Chart reviewed. Revlimid refilled per last office note with Dr. Katragadda.  

## 2022-08-07 ENCOUNTER — Encounter (HOSPITAL_COMMUNITY): Payer: Self-pay | Admitting: Hematology

## 2022-08-07 ENCOUNTER — Encounter: Payer: Self-pay | Admitting: Hematology

## 2022-08-14 ENCOUNTER — Inpatient Hospital Stay: Payer: 59

## 2022-08-14 ENCOUNTER — Other Ambulatory Visit: Payer: Self-pay

## 2022-08-14 VITALS — BP 178/66 | HR 64 | Temp 97.7°F | Resp 18 | Wt 191.5 lb

## 2022-08-14 DIAGNOSIS — C9000 Multiple myeloma not having achieved remission: Secondary | ICD-10-CM | POA: Diagnosis not present

## 2022-08-14 DIAGNOSIS — Z5112 Encounter for antineoplastic immunotherapy: Secondary | ICD-10-CM | POA: Diagnosis not present

## 2022-08-14 DIAGNOSIS — Z79899 Other long term (current) drug therapy: Secondary | ICD-10-CM | POA: Diagnosis not present

## 2022-08-14 LAB — CBC WITH DIFFERENTIAL/PLATELET
Abs Immature Granulocytes: 0.04 10*3/uL (ref 0.00–0.07)
Basophils Absolute: 0.1 10*3/uL (ref 0.0–0.1)
Basophils Relative: 1 %
Eosinophils Absolute: 0.2 10*3/uL (ref 0.0–0.5)
Eosinophils Relative: 2 %
HCT: 44.8 % (ref 39.0–52.0)
Hemoglobin: 14.5 g/dL (ref 13.0–17.0)
Immature Granulocytes: 1 %
Lymphocytes Relative: 11 %
Lymphs Abs: 0.9 10*3/uL (ref 0.7–4.0)
MCH: 27.4 pg (ref 26.0–34.0)
MCHC: 32.4 g/dL (ref 30.0–36.0)
MCV: 84.5 fL (ref 80.0–100.0)
Monocytes Absolute: 1.1 10*3/uL — ABNORMAL HIGH (ref 0.1–1.0)
Monocytes Relative: 13 %
Neutro Abs: 6.1 10*3/uL (ref 1.7–7.7)
Neutrophils Relative %: 72 %
Platelets: 211 10*3/uL (ref 150–400)
RBC: 5.3 MIL/uL (ref 4.22–5.81)
RDW: 15.4 % (ref 11.5–15.5)
WBC: 8.4 10*3/uL (ref 4.0–10.5)
nRBC: 0 % (ref 0.0–0.2)

## 2022-08-14 LAB — COMPREHENSIVE METABOLIC PANEL
ALT: 16 U/L (ref 0–44)
AST: 16 U/L (ref 15–41)
Albumin: 4.3 g/dL (ref 3.5–5.0)
Alkaline Phosphatase: 68 U/L (ref 38–126)
Anion gap: 10 (ref 5–15)
BUN: 24 mg/dL — ABNORMAL HIGH (ref 8–23)
CO2: 28 mmol/L (ref 22–32)
Calcium: 8.8 mg/dL — ABNORMAL LOW (ref 8.9–10.3)
Chloride: 99 mmol/L (ref 98–111)
Creatinine, Ser: 1.12 mg/dL (ref 0.61–1.24)
GFR, Estimated: >60 mL/min (ref >60)
Glucose, Bld: 109 mg/dL — ABNORMAL HIGH (ref 70–99)
Potassium: 4 mmol/L (ref 3.5–5.1)
Sodium: 137 mmol/L (ref 135–145)
Total Bilirubin: 1.3 mg/dL — ABNORMAL HIGH (ref 0.3–1.2)
Total Protein: 6.7 g/dL (ref 6.5–8.1)

## 2022-08-14 MED ORDER — DEXAMETHASONE 4 MG PO TABS
40.0000 mg | ORAL_TABLET | Freq: Once | ORAL | Status: AC
  Administered 2022-08-14: 40 mg via ORAL

## 2022-08-14 MED ORDER — DENOSUMAB 120 MG/1.7ML ~~LOC~~ SOLN
120.0000 mg | Freq: Once | SUBCUTANEOUS | Status: AC
  Administered 2022-08-14: 120 mg via SUBCUTANEOUS
  Filled 2022-08-14: qty 1.7

## 2022-08-14 MED ORDER — ACETAMINOPHEN 325 MG PO TABS
650.0000 mg | ORAL_TABLET | Freq: Once | ORAL | Status: AC
  Administered 2022-08-14: 650 mg via ORAL
  Filled 2022-08-14: qty 2

## 2022-08-14 MED ORDER — DARATUMUMAB-HYALURONIDASE-FIHJ 1800-30000 MG-UT/15ML ~~LOC~~ SOLN
1800.0000 mg | Freq: Once | SUBCUTANEOUS | Status: AC
  Administered 2022-08-14: 1800 mg via SUBCUTANEOUS
  Filled 2022-08-14: qty 15

## 2022-08-14 MED ORDER — CETIRIZINE HCL 10 MG PO TABS
10.0000 mg | ORAL_TABLET | Freq: Once | ORAL | Status: AC
  Administered 2022-08-14: 10 mg via ORAL
  Filled 2022-08-14: qty 1

## 2022-08-14 MED ORDER — DIPHENHYDRAMINE HCL 25 MG PO CAPS
50.0000 mg | ORAL_CAPSULE | Freq: Once | ORAL | Status: DC
  Filled 2022-08-14: qty 2

## 2022-08-14 NOTE — Patient Instructions (Signed)
[facility]  Discharge Instructions: Thank you for choosing [facility] to provide your oncology and hematology care.  If you have a lab appointment with the [facility], please come in thru the Main Entrance and check in at the main information desk.  Wear comfortable clothing and clothing appropriate for easy access to any Portacath or PICC line.   We strive to give you quality time with your provider. You may need to reschedule your appointment if you arrive late (15 or more minutes).  Arriving late affects you and other patients whose appointments are after yours.  Also, if you miss three or more appointments without notifying the office, you may be dismissed from the clinic at the provider's discretion.      For prescription refill requests, have your pharmacy contact our office and allow 72 hours for refills to be completed.    Today you received the following chemotherapy and/or immunotherapy agents Daratumumab.  Daratumumab; Hyaluronidase Injection What is this medication? DARATUMUMAB; HYALURONIDASE (dar a toom ue mab; hye al ur ON i dase) treats multiple myeloma, a type of bone marrow cancer. Daratumumab works by blocking a protein that causes cancer cells to grow and multiply. This helps to slow or stop the spread of cancer cells. Hyaluronidase works by increasing the absorption of other medications in the body to help them work better. This medication may also be used treat amyloidosis, a condition that causes the buildup of a protein (amyloid) in your body. It works by reducing the buildup of this protein, which decreases symptoms. It is a combination medication that contains a monoclonal antibody. This medicine may be used for other purposes; ask your health care provider or pharmacist if you have questions. COMMON BRAND NAME(S): DARZALEX FASPRO What should I tell my care team before I take this medication? They need to know if you have any of  these conditions: Heart disease Infection, such as chickenpox, cold sores, herpes, hepatitis B Lung or breathing disease An unusual or allergic reaction to daratumumab, hyaluronidase, other medications, foods, dyes, or preservatives Pregnant or trying to get pregnant Breast-feeding How should I use this medication? This medication is injected under the skin. It is given by your care team in a hospital or clinic setting. Talk to your care team about the use of this medication in children. Special care may be needed. Overdosage: If you think you have taken too much of this medicine contact a poison control center or emergency room at once. NOTE: This medicine is only for you. Do not share this medicine with others. What if I miss a dose? Keep appointments for follow-up doses. It is important not to miss your dose. Call your care team if you are unable to keep an appointment. What may interact with this medication? Interactions have not been studied. This list may not describe all possible interactions. Give your health care provider a list of all the medicines, herbs, non-prescription drugs, or dietary supplements you use. Also tell them if you smoke, drink alcohol, or use illegal drugs. Some items may interact with your medicine. What should I watch for while using this medication? Your condition will be monitored carefully while you are receiving this medication. This medication can cause serious allergic reactions. To reduce your risk, your care team may give you other medication to take before receiving this one. Be sure to follow the directions from your care team. This medication can affect the results of blood tests to match  your blood type. These changes can last for up to 6 months after the final dose. Your care team will do blood tests to match your blood type before you start treatment. Tell all of your care team that you are being treated with this medication before receiving a blood  transfusion. This medication can affect the results of some tests used to determine treatment response; extra tests may be needed to evaluate response. Talk to your care team if you wish to become pregnant or think you are pregnant. This medication can cause serious birth defects if taken during pregnancy and for 3 months after the last dose. A reliable form of contraception is recommended while taking this medication and for 3 months after the last dose. Talk to your care team about effective forms of contraception. Do not breast-feed while taking this medication. What side effects may I notice from receiving this medication? Side effects that you should report to your care team as soon as possible: Allergic reactions--skin rash, itching, hives, swelling of the face, lips, tongue, or throat Heart rhythm changes--fast or irregular heartbeat, dizziness, feeling faint or lightheaded, chest pain, trouble breathing Infection--fever, chills, cough, sore throat, wounds that don't heal, pain or trouble when passing urine, general feeling of discomfort or being unwell Infusion reactions--chest pain, shortness of breath or trouble breathing, feeling faint or lightheaded Sudden eye pain or change in vision such as blurry vision, seeing halos around lights, vision loss Unusual bruising or bleeding Side effects that usually do not require medical attention (report to your care team if they continue or are bothersome): Constipation Diarrhea Fatigue Nausea Pain, tingling, or numbness in the hands or feet Swelling of the ankles, hands, or feet This list may not describe all possible side effects. Call your doctor for medical advice about side effects. You may report side effects to FDA at 1-800-FDA-1088. Where should I keep my medication? This medication is given in a hospital or clinic. It will not be stored at home. NOTE: This sheet is a summary. It may not cover all possible information. If you have  questions about this medicine, talk to your doctor, pharmacist, or health care provider.  2023 Elsevier/Gold Standard (2021-10-25 00:00:00)        To help prevent nausea and vomiting after your treatment, we encourage you to take your nausea medication as directed.  BELOW ARE SYMPTOMS THAT SHOULD BE REPORTED IMMEDIATELY: *FEVER GREATER THAN 100.4 F (38 C) OR HIGHER *CHILLS OR SWEATING *NAUSEA AND VOMITING THAT IS NOT CONTROLLED WITH YOUR NAUSEA MEDICATION *UNUSUAL SHORTNESS OF BREATH *UNUSUAL BRUISING OR BLEEDING *URINARY PROBLEMS (pain or burning when urinating, or frequent urination) *BOWEL PROBLEMS (unusual diarrhea, constipation, pain near the anus) TENDERNESS IN MOUTH AND THROAT WITH OR WITHOUT PRESENCE OF ULCERS (sore throat, sores in mouth, or a toothache) UNUSUAL RASH, SWELLING OR PAIN  UNUSUAL VAGINAL DISCHARGE OR ITCHING   Items with * indicate a potential emergency and should be followed up as soon as possible or go to the Emergency Department if any problems should occur.  Please show the CHEMOTHERAPY ALERT CARD or IMMUNOTHERAPY ALERT CARD at check-in to the Emergency Department and triage nurse.  Should you have questions after your visit or need to cancel or reschedule your appointment, please contact [facility] [phone]  and follow the prompts.  Office hours are 8:00 a.m. to 4:30 p.m. Monday - Friday. Please note that voicemails left after 4:00 p.m. may not be returned until the following business day.  We are closed weekends and major holidays. You have access to a nurse at all times for urgent questions. Please call the main number to the clinic [phone] and follow the prompts.  For any non-urgent questions, you may also contact your provider using MyChart. We now offer e-Visits for anyone 77 and older to request care online for non-urgent symptoms. For details visit [email or web address].   Also download the MyChart app! Go to the app  store, search "MyChart", open the app, select [facility], and log in with your MyChart username and password.

## 2022-08-14 NOTE — Progress Notes (Signed)
Patient presents today for Daratumumab and Xgeva injections.  Patient is in satisfactory condition with no complaints voiced.  Vital signs are stable.  Labs reviewed and all labs are within treatment parameters.  We will proceed with injections per MD orders.   Patient tolerated injections well with no complaints voiced.  Patient left ambulatory in stable condition.  Vital signs stable at discharge.  Follow up as scheduled.

## 2022-08-16 ENCOUNTER — Encounter: Payer: Self-pay | Admitting: Hematology

## 2022-08-16 ENCOUNTER — Encounter (HOSPITAL_COMMUNITY): Payer: Self-pay | Admitting: Hematology

## 2022-08-22 ENCOUNTER — Other Ambulatory Visit: Payer: Self-pay

## 2022-08-22 MED ORDER — LENALIDOMIDE 10 MG PO CAPS: 10.0000 mg | ORAL_CAPSULE | Freq: Every day | ORAL | 0 refills | Status: DC

## 2022-08-22 NOTE — Telephone Encounter (Signed)
Chart reviewed. Revlimid refilled per last office note with Dr. Katragadda.  

## 2022-08-28 ENCOUNTER — Other Ambulatory Visit: Payer: Self-pay | Admitting: Hematology

## 2022-09-08 ENCOUNTER — Other Ambulatory Visit: Payer: Self-pay

## 2022-09-09 ENCOUNTER — Other Ambulatory Visit: Payer: Self-pay

## 2022-09-11 ENCOUNTER — Inpatient Hospital Stay: Payer: 59 | Attending: Hematology

## 2022-09-11 ENCOUNTER — Inpatient Hospital Stay: Payer: 59

## 2022-09-11 VITALS — BP 177/68 | HR 63 | Temp 98.1°F | Resp 18

## 2022-09-11 DIAGNOSIS — C9000 Multiple myeloma not having achieved remission: Secondary | ICD-10-CM | POA: Diagnosis not present

## 2022-09-11 DIAGNOSIS — Z79899 Other long term (current) drug therapy: Secondary | ICD-10-CM | POA: Insufficient documentation

## 2022-09-11 DIAGNOSIS — Z5112 Encounter for antineoplastic immunotherapy: Secondary | ICD-10-CM | POA: Diagnosis present

## 2022-09-11 DIAGNOSIS — Z5111 Encounter for antineoplastic chemotherapy: Secondary | ICD-10-CM | POA: Diagnosis not present

## 2022-09-11 LAB — CBC WITH DIFFERENTIAL/PLATELET
Abs Immature Granulocytes: 0.04 10*3/uL (ref 0.00–0.07)
Basophils Absolute: 0.1 10*3/uL (ref 0.0–0.1)
Basophils Relative: 1 %
Eosinophils Absolute: 0.1 10*3/uL (ref 0.0–0.5)
Eosinophils Relative: 2 %
HCT: 44 % (ref 39.0–52.0)
Hemoglobin: 14.4 g/dL (ref 13.0–17.0)
Immature Granulocytes: 1 %
Lymphocytes Relative: 13 %
Lymphs Abs: 0.9 10*3/uL (ref 0.7–4.0)
MCH: 27.3 pg (ref 26.0–34.0)
MCHC: 32.7 g/dL (ref 30.0–36.0)
MCV: 83.3 fL (ref 80.0–100.0)
Monocytes Absolute: 1 10*3/uL (ref 0.1–1.0)
Monocytes Relative: 15 %
Neutro Abs: 4.6 10*3/uL (ref 1.7–7.7)
Neutrophils Relative %: 68 %
Platelets: 179 10*3/uL (ref 150–400)
RBC: 5.28 MIL/uL (ref 4.22–5.81)
RDW: 15.9 % — ABNORMAL HIGH (ref 11.5–15.5)
WBC: 6.8 10*3/uL (ref 4.0–10.5)
nRBC: 0 % (ref 0.0–0.2)

## 2022-09-11 LAB — COMPREHENSIVE METABOLIC PANEL
ALT: 17 U/L (ref 0–44)
AST: 17 U/L (ref 15–41)
Albumin: 4.2 g/dL (ref 3.5–5.0)
Alkaline Phosphatase: 67 U/L (ref 38–126)
Anion gap: 11 (ref 5–15)
BUN: 21 mg/dL (ref 8–23)
CO2: 28 mmol/L (ref 22–32)
Calcium: 9.3 mg/dL (ref 8.9–10.3)
Chloride: 98 mmol/L (ref 98–111)
Creatinine, Ser: 1.09 mg/dL (ref 0.61–1.24)
GFR, Estimated: >60 mL/min (ref >60)
Glucose, Bld: 118 mg/dL — ABNORMAL HIGH (ref 70–99)
Potassium: 3.9 mmol/L (ref 3.5–5.1)
Sodium: 137 mmol/L (ref 135–145)
Total Bilirubin: 1.4 mg/dL — ABNORMAL HIGH (ref 0.3–1.2)
Total Protein: 6.6 g/dL (ref 6.5–8.1)

## 2022-09-11 MED ORDER — ACETAMINOPHEN 325 MG PO TABS
650.0000 mg | ORAL_TABLET | Freq: Once | ORAL | Status: AC
  Administered 2022-09-11: 650 mg via ORAL
  Filled 2022-09-11: qty 2

## 2022-09-11 MED ORDER — CETIRIZINE HCL 10 MG PO TABS
10.0000 mg | ORAL_TABLET | Freq: Once | ORAL | Status: AC
  Administered 2022-09-11: 10 mg via ORAL
  Filled 2022-09-11: qty 1

## 2022-09-11 MED ORDER — DIPHENHYDRAMINE HCL 25 MG PO CAPS: 50.0000 mg | ORAL_CAPSULE | Freq: Once | ORAL | Status: DC

## 2022-09-11 MED ORDER — DENOSUMAB 120 MG/1.7ML ~~LOC~~ SOLN
120.0000 mg | Freq: Once | SUBCUTANEOUS | Status: AC
  Administered 2022-09-11: 120 mg via SUBCUTANEOUS
  Filled 2022-09-11: qty 1.7

## 2022-09-11 MED ORDER — DEXAMETHASONE 4 MG PO TABS
40.0000 mg | ORAL_TABLET | Freq: Once | ORAL | Status: AC
  Administered 2022-09-11: 40 mg via ORAL
  Filled 2022-09-11: qty 10

## 2022-09-11 MED ORDER — DARATUMUMAB-HYALURONIDASE-FIHJ 1800-30000 MG-UT/15ML ~~LOC~~ SOLN
1800.0000 mg | Freq: Once | SUBCUTANEOUS | Status: AC
  Administered 2022-09-11: 1800 mg via SUBCUTANEOUS
  Filled 2022-09-11: qty 15

## 2022-09-11 NOTE — Patient Instructions (Signed)
[facility]  Discharge Instructions: Thank you for choosing [facility] to provide your oncology and hematology care.  If you have a lab appointment with the Cancer Center, please come in thru the Main Entrance and check in at the main information desk.  Wear comfortable clothing and clothing appropriate for easy access to any Portacath or PICC line.   We strive to give you quality time with your provider. You may need to reschedule your appointment if you arrive late (15 or more minutes).  Arriving late affects you and other patients whose appointments are after yours.  Also, if you miss three or more appointments without notifying the office, you may be dismissed from the clinic at the provider's discretion.      For prescription refill requests, have your pharmacy contact our office and allow 72 hours for refills to be completed.    Today you received the following chemotherapy and/or immunotherapy agents Daratumumab.  Daratumumab; Hyaluronidase Injection What is this medication? DARATUMUMAB; HYALURONIDASE (dar a toom ue mab; hye al ur ON i dase) treats multiple myeloma, a type of bone marrow cancer. Daratumumab works by blocking a protein that causes cancer cells to grow and multiply. This helps to slow or stop the spread of cancer cells. Hyaluronidase works by increasing the absorption of other medications in the body to help them work better. This medication may also be used treat amyloidosis, a condition that causes the buildup of a protein (amyloid) in your body. It works by reducing the buildup of this protein, which decreases symptoms. It is a combination medication that contains a monoclonal antibody. This medicine may be used for other purposes; ask your health care provider or pharmacist if you have questions. COMMON BRAND NAME(S): DARZALEX FASPRO What should I tell my care team before I take this medication? They need to know if you have any of  these conditions: Heart disease Infection, such as chickenpox, cold sores, herpes, hepatitis B Lung or breathing disease An unusual or allergic reaction to daratumumab, hyaluronidase, other medications, foods, dyes, or preservatives Pregnant or trying to get pregnant Breast-feeding How should I use this medication? This medication is injected under the skin. It is given by your care team in a hospital or clinic setting. Talk to your care team about the use of this medication in children. Special care may be needed. Overdosage: If you think you have taken too much of this medicine contact a poison control center or emergency room at once. NOTE: This medicine is only for you. Do not share this medicine with others. What if I miss a dose? Keep appointments for follow-up doses. It is important not to miss your dose. Call your care team if you are unable to keep an appointment. What may interact with this medication? Interactions have not been studied. This list may not describe all possible interactions. Give your health care provider a list of all the medicines, herbs, non-prescription drugs, or dietary supplements you use. Also tell them if you smoke, drink alcohol, or use illegal drugs. Some items may interact with your medicine. What should I watch for while using this medication? Your condition will be monitored carefully while you are receiving this medication. This medication can cause serious allergic reactions. To reduce your risk, your care team may give you other medication to take before receiving this one. Be sure to follow the directions from your care team. This medication can affect the results of blood tests to match   your blood type. These changes can last for up to 6 months after the final dose. Your care team will do blood tests to match your blood type before you start treatment. Tell all of your care team that you are being treated with this medication before receiving a blood  transfusion. This medication can affect the results of some tests used to determine treatment response; extra tests may be needed to evaluate response. Talk to your care team if you wish to become pregnant or think you are pregnant. This medication can cause serious birth defects if taken during pregnancy and for 3 months after the last dose. A reliable form of contraception is recommended while taking this medication and for 3 months after the last dose. Talk to your care team about effective forms of contraception. Do not breast-feed while taking this medication. What side effects may I notice from receiving this medication? Side effects that you should report to your care team as soon as possible: Allergic reactions--skin rash, itching, hives, swelling of the face, lips, tongue, or throat Heart rhythm changes--fast or irregular heartbeat, dizziness, feeling faint or lightheaded, chest pain, trouble breathing Infection--fever, chills, cough, sore throat, wounds that don't heal, pain or trouble when passing urine, general feeling of discomfort or being unwell Infusion reactions--chest pain, shortness of breath or trouble breathing, feeling faint or lightheaded Sudden eye pain or change in vision such as blurry vision, seeing halos around lights, vision loss Unusual bruising or bleeding Side effects that usually do not require medical attention (report to your care team if they continue or are bothersome): Constipation Diarrhea Fatigue Nausea Pain, tingling, or numbness in the hands or feet Swelling of the ankles, hands, or feet This list may not describe all possible side effects. Call your doctor for medical advice about side effects. You may report side effects to FDA at 1-800-FDA-1088. Where should I keep my medication? This medication is given in a hospital or clinic. It will not be stored at home. NOTE: This sheet is a summary. It may not cover all possible information. If you have  questions about this medicine, talk to your doctor, pharmacist, or health care provider.  2023 Elsevier/Gold Standard (2021-10-25 00:00:00)        To help prevent nausea and vomiting after your treatment, we encourage you to take your nausea medication as directed.  BELOW ARE SYMPTOMS THAT SHOULD BE REPORTED IMMEDIATELY: *FEVER GREATER THAN 100.4 F (38 C) OR HIGHER *CHILLS OR SWEATING *NAUSEA AND VOMITING THAT IS NOT CONTROLLED WITH YOUR NAUSEA MEDICATION *UNUSUAL SHORTNESS OF BREATH *UNUSUAL BRUISING OR BLEEDING *URINARY PROBLEMS (pain or burning when urinating, or frequent urination) *BOWEL PROBLEMS (unusual diarrhea, constipation, pain near the anus) TENDERNESS IN MOUTH AND THROAT WITH OR WITHOUT PRESENCE OF ULCERS (sore throat, sores in mouth, or a toothache) UNUSUAL RASH, SWELLING OR PAIN  UNUSUAL VAGINAL DISCHARGE OR ITCHING   Items with * indicate a potential emergency and should be followed up as soon as possible or go to the Emergency Department if any problems should occur.  Please show the CHEMOTHERAPY ALERT CARD or IMMUNOTHERAPY ALERT CARD at check-in to the Emergency Department and triage nurse.  Should you have questions after your visit or need to cancel or reschedule your appointment, please contact [facility] [phone]  and follow the prompts.  Office hours are 8:00 a.m. to 4:30 p.m. Monday - Friday. Please note that voicemails left after 4:00 p.m. may not be returned until the following business day.    We are closed weekends and major holidays. You have access to a nurse at all times for urgent questions. Please call the main number to the clinic [phone] and follow the prompts.  For any non-urgent questions, you may also contact your provider using MyChart. We now offer e-Visits for anyone 18 and older to request care online for non-urgent symptoms. For details visit [email or web address].   Also download the MyChart app! Go to the app  store, search "MyChart", open the app, select [facility], and log in with your MyChart username and password.   

## 2022-09-20 ENCOUNTER — Other Ambulatory Visit: Payer: Self-pay

## 2022-09-20 MED ORDER — LENALIDOMIDE 10 MG PO CAPS: 10.0000 mg | ORAL_CAPSULE | Freq: Every day | ORAL | 0 refills | Status: DC

## 2022-09-20 NOTE — Telephone Encounter (Signed)
Chart reviewed. Revlimid refilled per last office note with Dr. Katragadda.  

## 2022-10-09 ENCOUNTER — Other Ambulatory Visit: Payer: Self-pay

## 2022-10-09 ENCOUNTER — Inpatient Hospital Stay: Payer: 59

## 2022-10-09 ENCOUNTER — Inpatient Hospital Stay: Payer: 59 | Attending: Hematology | Admitting: Hematology

## 2022-10-09 VITALS — BP 206/77 | HR 89 | Temp 97.9°F | Resp 16 | Wt 192.0 lb

## 2022-10-09 DIAGNOSIS — Z79899 Other long term (current) drug therapy: Secondary | ICD-10-CM | POA: Diagnosis not present

## 2022-10-09 DIAGNOSIS — C9000 Multiple myeloma not having achieved remission: Secondary | ICD-10-CM

## 2022-10-09 DIAGNOSIS — Z5112 Encounter for antineoplastic immunotherapy: Secondary | ICD-10-CM | POA: Diagnosis not present

## 2022-10-09 LAB — COMPREHENSIVE METABOLIC PANEL
ALT: 18 U/L (ref 0–44)
AST: 18 U/L (ref 15–41)
Albumin: 4.4 g/dL (ref 3.5–5.0)
Alkaline Phosphatase: 70 U/L (ref 38–126)
Anion gap: 9 (ref 5–15)
BUN: 25 mg/dL — ABNORMAL HIGH (ref 8–23)
CO2: 28 mmol/L (ref 22–32)
Calcium: 8.9 mg/dL (ref 8.9–10.3)
Chloride: 99 mmol/L (ref 98–111)
Creatinine, Ser: 1.05 mg/dL (ref 0.61–1.24)
GFR, Estimated: >60 mL/min (ref >60)
Glucose, Bld: 115 mg/dL — ABNORMAL HIGH (ref 70–99)
Potassium: 3.5 mmol/L (ref 3.5–5.1)
Sodium: 136 mmol/L (ref 135–145)
Total Bilirubin: 1.3 mg/dL — ABNORMAL HIGH (ref 0.3–1.2)
Total Protein: 6.8 g/dL (ref 6.5–8.1)

## 2022-10-09 LAB — CBC WITH DIFFERENTIAL/PLATELET
Abs Immature Granulocytes: 0.03 10*3/uL (ref 0.00–0.07)
Basophils Absolute: 0.1 10*3/uL (ref 0.0–0.1)
Basophils Relative: 1 %
Eosinophils Absolute: 0.2 10*3/uL (ref 0.0–0.5)
Eosinophils Relative: 2 %
HCT: 45.8 % (ref 39.0–52.0)
Hemoglobin: 14.8 g/dL (ref 13.0–17.0)
Immature Granulocytes: 0 %
Lymphocytes Relative: 13 %
Lymphs Abs: 1 10*3/uL (ref 0.7–4.0)
MCH: 27.3 pg (ref 26.0–34.0)
MCHC: 32.3 g/dL (ref 30.0–36.0)
MCV: 84.3 fL (ref 80.0–100.0)
Monocytes Absolute: 1.2 10*3/uL — ABNORMAL HIGH (ref 0.1–1.0)
Monocytes Relative: 15 %
Neutro Abs: 5.5 10*3/uL (ref 1.7–7.7)
Neutrophils Relative %: 69 %
Platelets: 191 10*3/uL (ref 150–400)
RBC: 5.43 MIL/uL (ref 4.22–5.81)
RDW: 15.7 % — ABNORMAL HIGH (ref 11.5–15.5)
WBC: 7.9 10*3/uL (ref 4.0–10.5)
nRBC: 0 % (ref 0.0–0.2)

## 2022-10-09 LAB — MAGNESIUM: Magnesium: 2 mg/dL (ref 1.7–2.4)

## 2022-10-09 MED ORDER — ACETAMINOPHEN 325 MG PO TABS
650.0000 mg | ORAL_TABLET | Freq: Once | ORAL | Status: AC
  Administered 2022-10-09: 650 mg via ORAL
  Filled 2022-10-09: qty 2

## 2022-10-09 MED ORDER — DENOSUMAB 120 MG/1.7ML ~~LOC~~ SOLN
120.0000 mg | Freq: Once | SUBCUTANEOUS | Status: AC
  Administered 2022-10-09: 120 mg via SUBCUTANEOUS
  Filled 2022-10-09: qty 1.7

## 2022-10-09 MED ORDER — DARATUMUMAB-HYALURONIDASE-FIHJ 1800-30000 MG-UT/15ML ~~LOC~~ SOLN
1800.0000 mg | Freq: Once | SUBCUTANEOUS | Status: AC
  Administered 2022-10-09: 1800 mg via SUBCUTANEOUS
  Filled 2022-10-09: qty 15

## 2022-10-09 MED ORDER — DEXAMETHASONE 4 MG PO TABS
40.0000 mg | ORAL_TABLET | Freq: Once | ORAL | Status: AC
  Administered 2022-10-09: 40 mg via ORAL
  Filled 2022-10-09: qty 10

## 2022-10-09 MED ORDER — CETIRIZINE HCL 10 MG PO TABS
10.0000 mg | ORAL_TABLET | Freq: Once | ORAL | Status: AC
  Administered 2022-10-09: 10 mg via ORAL
  Filled 2022-10-09: qty 1

## 2022-10-09 MED ORDER — ACYCLOVIR 400 MG PO TABS: 400.0000 mg | ORAL_TABLET | Freq: Two times a day (BID) | ORAL | 6 refills | Status: AC

## 2022-10-09 NOTE — Progress Notes (Signed)
Patient presents today for Daratumumab and Xgeva injections per providers order.  Vital signs and labs reviewed by MD.  Message received from Anastasio Champion RN/Dr. Delton Coombes patient okay for treatment.  MD aware of elevated BP, per Delton Coombes okay to proceed with treatment.  Stable during administration without incident; injection site WNL; see MAR for injection details.  Patient tolerated procedure well and without incident.  No questions or complaints noted at this time.

## 2022-10-09 NOTE — Progress Notes (Signed)
[facility] [address], [place] [address]    Clinic Day:  10/09/2022  Referring physician: Carlena Hurl, PA-C  Patient Care Team: Derek Jack, MD as PCP - General (Hematology) Derek Jack, MD as Medical Oncologist (Oncology)   ASSESSMENT & PLAN:   Assessment: 1.  IgG kappa light chain multiple myeloma: - Admission with hypercalcemia and renal failure. - CT CAP showed extensive lytic foci throughout the axial and appendicular skeleton of the chest, abdomen and pelvis.  Splenomegaly with an enlarged lobular spleen, nonspecific. - Bone marrow biopsy on 11/01/2020 shows 85% atypical plasma cells in the aspirate. - FISH panel positive for 1 p-,-13,14q-,16q-,17p-/-17 and 20q- - Cytogenetics-no metaphases available for analysis. - SPEP with 5.4 g of M spike.  Free light chain ratio 1030.  LDH normal.  Beta-2 microglobulin 18.7.  24-hour urine total protein 3.6 g. - 6 cycles of Dara VRD from 11/21/2020 through 03/07/2021. - Patient declined bone marrow transplant. - Maintenance Revlimid and monthly daratumumab started on 03/27/2021.   2.  Social/family history: - He worked as a Administrator.  He lives by himself. - Mother with breast and ovarian cancer.  Father had metastatic kidney cancer.  His brother's daughter has CML.  Plan: 1.  IgG kappa light chain multiple myeloma, high risk: - He is tolerating Revlimid very well. - No recent infections or new pains. - Reviewed myeloma labs from 07/17/2022.  M spike resurfaced at 0.1 g.  Free light chain ratio is 2.23 and stable since October.  Immunofixation shows IgG kappa. - Reviewed labs today: Normal LFTs and creatinine.  Calcium is normal.  CBC was grossly normal. - Continue Revlimid 10 mg 3 weeks on/1 week off.  Continue monthly Darzalex.  Continue dexamethasone 40 mg on the days of Darzalex. - RTC 3 months for follow-up with repeat myeloma labs.   2.  Myeloma bone disease: - Calcium is normal.   Continue calcium and D supplements.  Continue denosumab today and monthly.  3.  ID prophylaxis: - Continue acyclovir twice daily and aspirin 81 mg daily.  Orders Placed This Encounter  Procedures   CBC with Differential    Standing Status:   Future    Standing Expiration Date:   01/02/2024   Comprehensive metabolic panel    Standing Status:   Future    Standing Expiration Date:   01/02/2024   CBC with Differential    Standing Status:   Future    Standing Expiration Date:   01/30/2024   Comprehensive metabolic panel    Standing Status:   Future    Standing Expiration Date:   01/30/2024   CBC with Differential    Standing Status:   Future    Standing Expiration Date:   02/27/2024   Comprehensive metabolic panel    Standing Status:   Future    Standing Expiration Date:   02/27/2024   CBC with Differential    Standing Status:   Future    Standing Expiration Date:   03/26/2024   Comprehensive metabolic panel    Standing Status:   Future    Standing Expiration Date:   03/26/2024      I,Alexis Herring,acting as a scribe for [employer], MD.,have documented all relevant documentation on the behalf of Derek Jack, MD,as directed by  Derek Jack, MD while in the presence of Derek Jack, MD.   I, Derek Jack MD, have reviewed the above documentation for accuracy and completeness, and I agree with  the above.   Derek Jack, MD   3/26/20245:38 PM  CHIEF COMPLAINT:   Diagnosis: multiple myeloma    Cancer Staging  No matching staging information was found for the patient.   Prior Therapy: Dara CyBorD   Current Therapy:  Maintenance Revlimid and daratumumab   HISTORY OF PRESENT ILLNESS:   Oncology History  Multiple myeloma not having achieved remission ([place])  11/01/2020 Initial Diagnosis   Multiple myeloma not having achieved remission ([place])   11/02/2020 - 11/02/2020 Chemotherapy         11/16/2020 -  Chemotherapy   Patient is on  Treatment Plan : MYELOMA NEWLY DIAGNOSED TRANSPLANT CANDIDATE DaraVRd (Daratumumab SQ) q21d x 6 Cycles (Induction/Consolidation)     11/21/2020 - 02/27/2022 Chemotherapy   Patient is on Treatment Plan : MYELOMA NEWLY DIAGNOSED TRANSPLANT CANDIDATE DaraVRd (Daratumumab SQ) q21d x 6 Cycles (Induction/Consolidation)     04/24/2021 - 04/24/2021 Chemotherapy   Patient is on Treatment Plan : MYELOMA NEWLY DIAGNOSED TRANSPLANT CANDIDATE Daratumumab SQ + Lenalidomide q28d (Maintenance)        INTERVAL HISTORY:   Paul Norton is a 67 y.o. male presenting to clinic today for follow up of multiple myeloma. He was last seen by me on 07/17/22.  Today, he states that he is doing well overall. His appetite level is at 100%. His energy level is at 100%. He denies any new problems or recent infections. He denies any pain, fatigue, weakness, numbness/tingling, or leg swelling. He is tolerating Revlimid well. His medications are still managed by family.  PAST MEDICAL HISTORY:   Past Medical History: Past Medical History:  Diagnosis Date   Broken leg    Colonoscopy refused 09/2017   Hyperlipidemia    Hypertension    Multiple myeloma ([place])    Pneumonia    Refuses treatment 09/2017   refuses screenings such as cancer screening, refuses vaccines, refuses normal preventative care   Vaccine refused by patient    all vaccines as of 09/2017    Surgical History: Past Surgical History:  Procedure Laterality Date   COLONOSCOPY     never, declines as of 09/2017    Social History: Social History   Socioeconomic History   Marital status: Divorced    Spouse name: Not on file   Number of children: 1   Years of education: Not on file   Highest education level: Not on file  Occupational History   Occupation: Disability  Tobacco Use   Smoking status: Never   Smokeless tobacco: Never  Substance and Sexual Activity   Alcohol use: No   Drug use: Never   Sexual activity: Not Currently  Other Topics Concern    Not on file  Social History Narrative   Not on file   Social Determinants of Health   Financial Resource Strain: Medium Risk (11/15/2020)   Overall Financial Resource Strain (CARDIA)    Difficulty of Paying Living Expenses: Somewhat hard  Food Insecurity: No Food Insecurity (11/15/2020)   Hunger Vital Sign    Worried About Running Out of Food in the Last Year: Never true    [place] in the Last Year: Never true  Transportation Needs: No Transportation Needs (11/15/2020)   PRAPARE - Hydrologist (Medical): No    Lack of Transportation (Non-Medical): No  Physical Activity: Insufficiently Active (11/21/2020)   Exercise Vital Sign    Days of Exercise per Week: 5 days    Minutes of Exercise per Session: 20 min  Stress: No Stress Concern Present (11/21/2020)   [place]    Feeling of Stress : Not at all  Social Connections: Socially Isolated (11/21/2020)   Social Connection and Isolation Panel [NHANES]    Frequency of Communication with Friends and Family: More than three times a week    Frequency of Social Gatherings with Friends and Family: More than three times a week    Attends Religious Services: Never    Marine scientist or Organizations: No    Attends Archivist Meetings: Never    Marital Status: Divorced  Human resources officer Violence: Not At Risk (11/21/2020)   Humiliation, Afraid, Rape, and Kick questionnaire    Fear of Current or Ex-Partner: No    Emotionally Abused: No    Physically Abused: No    Sexually Abused: No    Family History: No family history on file.  Current Medications:  Current Outpatient Medications:    acetaminophen (TYLENOL) 325 MG tablet, Take 2 tablets (650 mg total) by mouth every 6 (six) hours as needed for mild pain (or Fever >/= 101)., Disp: 30 tablet, Rfl: 30   acyclovir (ZOVIRAX) 400 MG tablet, Take 1 tablet (400 mg total) by mouth 2 (two)  times daily., Disp: 60 tablet, Rfl: 6   amLODipine (NORVASC) 10 MG tablet, TAKE ONE TABLET BY MOUTH ONCE DAILY, Disp: 30 tablet, Rfl: 3   aspirin 81 MG chewable tablet, Chew by mouth daily., Disp: , Rfl:    Calcium Carb-Cholecalciferol (OYSTER SHELL CALCIUM W/D) 500-5 MG-MCG TABS, TAKE ONE TABLET BY MOUTH DAILY, Disp: 30 tablet, Rfl: 3   feeding supplement (ENSURE ENLIVE / ENSURE PLUS) LIQD, Take 237 mLs by mouth 3 (three) times daily between meals., Disp: 237 mL, Rfl: 12   lenalidomide (REVLIMID) 10 MG capsule, Take 1 capsule (10 mg total) by mouth daily. 21 days on, 7 days off, Disp: 21 capsule, Rfl: 0   Allergies: No Known Allergies  REVIEW OF SYSTEMS:   Review of Systems  Constitutional:  Negative for chills, fatigue and fever.  HENT:   Negative for lump/mass, mouth sores, nosebleeds, sore throat and trouble swallowing.   Eyes:  Negative for eye problems.  Respiratory:  Negative for cough and shortness of breath.   Cardiovascular:  Negative for chest pain, leg swelling and palpitations.  Gastrointestinal:  Negative for abdominal pain, constipation, diarrhea, nausea and vomiting.  Genitourinary:  Negative for bladder incontinence, difficulty urinating, dysuria, frequency, hematuria and nocturia.   Musculoskeletal:  Negative for arthralgias, back pain, flank pain, myalgias and neck pain.  Skin:  Negative for itching and rash.  Neurological:  Negative for dizziness, headaches and numbness.  Hematological:  Does not bruise/bleed easily.  Psychiatric/Behavioral:  Negative for depression, sleep disturbance and suicidal ideas. The patient is not nervous/anxious.   All other systems reviewed and are negative.    VITALS:   Blood pressure (!) 206/77, pulse 89, temperature 97.9 F (36.6 C), temperature source Oral, resp. rate 16, weight 192 lb (87.1 kg), SpO2 100 %.  Wt Readings from Last 3 Encounters:  10/09/22 192 lb (87.1 kg)  08/14/22 191 lb 8 oz (86.9 kg)  07/17/22 192 lb 14.4 oz  (87.5 kg)    Body mass index is 27.55 kg/m.  Performance status (ECOG): 1 - Symptomatic but completely ambulatory  PHYSICAL EXAM:   Physical Exam Vitals and nursing note reviewed. Exam conducted with a chaperone present.  Constitutional:      Appearance: Normal  appearance.  Cardiovascular:     Rate and Rhythm: Normal rate and regular rhythm.     Pulses: Normal pulses.     Heart sounds: Normal heart sounds.  Pulmonary:     Effort: Pulmonary effort is normal.     Breath sounds: Normal breath sounds.  Abdominal:     Palpations: Abdomen is soft. There is no hepatomegaly, splenomegaly or mass.     Tenderness: There is no abdominal tenderness.  Musculoskeletal:     Right lower leg: No edema.     Left lower leg: No edema.  Lymphadenopathy:     Cervical: No cervical adenopathy.     Right cervical: No superficial, deep or posterior cervical adenopathy.    Left cervical: No superficial, deep or posterior cervical adenopathy.     Upper Body:     Right upper body: No supraclavicular or axillary adenopathy.     Left upper body: No supraclavicular or axillary adenopathy.  Neurological:     General: No focal deficit present.     Mental Status: He is alert and oriented to person, place, and time.  Psychiatric:        Mood and Affect: Mood normal.        Behavior: Behavior normal.     LABS:      Latest Ref Rng & Units 10/09/2022   12:24 PM 09/11/2022   12:26 PM 08/14/2022   12:38 PM  CBC  WBC 4.0 - 10.5 K/uL 7.9  6.8  8.4   Hemoglobin 13.0 - 17.0 g/dL 14.8  14.4  14.5   Hematocrit 39.0 - 52.0 % 45.8  44.0  44.8   Platelets 150 - 400 K/uL 191  179  211       Latest Ref Rng & Units 10/09/2022   12:24 PM 09/11/2022   12:26 PM 08/14/2022   12:38 PM  CMP  Glucose 70 - 99 mg/dL 115  118  109   BUN 8 - 23 mg/dL 25  21  24    Creatinine 0.61 - 1.24 mg/dL 1.05  1.09  1.12   Sodium 135 - 145 mmol/L 136  137  137   Potassium 3.5 - 5.1 mmol/L 3.5  3.9  4.0   Chloride 98 - 111 mmol/L 99   98  99   CO2 22 - 32 mmol/L 28  28  28    Calcium 8.9 - 10.3 mg/dL 8.9  9.3  8.8   Total Protein 6.5 - 8.1 g/dL 6.8  6.6  6.7   Total Bilirubin 0.3 - 1.2 mg/dL 1.3  1.4  1.3   Alkaline Phos 38 - 126 U/L 70  67  68   AST 15 - 41 U/L 18  17  16    ALT 0 - 44 U/L 18  17  16       Lab Results  Component Value Date   CEA1 1.1 10/30/2020   /  CEA  Date Value Ref Range Status  10/30/2020 1.1 0.0 - 4.7 ng/mL Final    Comment:    (NOTE)                             Nonsmokers          <3.9                             Smokers             <  5.6 Roche [employer] Electrochemiluminescence Immunoassay (ECLIA) Values obtained with different assay methods or kits cannot be used interchangeably.  Results cannot be interpreted as absolute evidence of the presence or absence of malignant disease. Performed At: [facility] [place], [place] [id number] Rush Farmer MD [id number]    No results found for: "PSA1" No results found for: "CAN199" No results found for: "CAN125"  Lab Results  Component Value Date   TOTALPROTELP 5.8 (L) 07/17/2022   ALBUMINELP 3.8 07/17/2022   A1GS 0.2 07/17/2022   A2GS 0.7 07/17/2022   BETS 0.9 07/17/2022   GAMS 0.3 (L) 07/17/2022   MSPIKE 0.1 (H) 07/17/2022   SPEI Comment 07/17/2022   Lab Results  Component Value Date   TIBC 242 (L) 10/30/2020   TIBC 266 10/29/2020   FERRITIN 1,406 (H) 10/30/2020   FERRITIN 627 (H) 10/29/2020   IRONPCTSAT 42 (H) 10/30/2020   IRONPCTSAT 47 (H) 10/29/2020   Lab Results  Component Value Date   LDH 113 01/30/2022   LDH 107 01/02/2022   LDH 135 06/19/2021     STUDIES:   No results found.

## 2022-10-09 NOTE — Patient Instructions (Signed)
[facility] [place]  Discharge Instructions: Thank you for choosing [facility] to provide your oncology and hematology care.  If you have a lab appointment with the Cancer Center, please come in thru the Main Entrance and check in at the main information desk.  Wear comfortable clothing and clothing appropriate for easy access to any Portacath or PICC line.   We strive to give you quality time with your provider. You may need to reschedule your appointment if you arrive late (15 or more minutes).  Arriving late affects you and other patients whose appointments are after yours.  Also, if you miss three or more appointments without notifying the office, you may be dismissed from the clinic at the provider's discretion.      For prescription refill requests, have your pharmacy contact our office and allow 72 hours for refills to be completed.    Today you received the following chemotherapy and/or immunotherapy agents Daratumumab Xgeva      To help prevent nausea and vomiting after your treatment, we encourage you to take your nausea medication as directed.  BELOW ARE SYMPTOMS THAT SHOULD BE REPORTED IMMEDIATELY: *FEVER GREATER THAN 100.4 F (38 C) OR HIGHER *CHILLS OR SWEATING *NAUSEA AND VOMITING THAT IS NOT CONTROLLED WITH YOUR NAUSEA MEDICATION *UNUSUAL SHORTNESS OF BREATH *UNUSUAL BRUISING OR BLEEDING *URINARY PROBLEMS (pain or burning when urinating, or frequent urination) *BOWEL PROBLEMS (unusual diarrhea, constipation, pain near the anus) TENDERNESS IN MOUTH AND THROAT WITH OR WITHOUT PRESENCE OF ULCERS (sore throat, sores in mouth, or a toothache) UNUSUAL RASH, SWELLING OR PAIN  UNUSUAL VAGINAL DISCHARGE OR ITCHING   Items with * indicate a potential emergency and should be followed up as soon as possible or go to the Emergency Department if any problems should occur.  Please show the CHEMOTHERAPY ALERT CARD or IMMUNOTHERAPY ALERT CARD at check-in to the  Emergency Department and triage nurse.  Should you have questions after your visit or need to cancel or reschedule your appointment, please contact [facility] [place] [phone]  and follow the prompts.  Office hours are 8:00 a.m. to 4:30 p.m. Monday - Friday. Please note that voicemails left after 4:00 p.m. may not be returned until the following business day.  We are closed weekends and major holidays. You have access to a nurse at all times for urgent questions. Please call the main number to the clinic [phone] and follow the prompts.  For any non-urgent questions, you may also contact your provider using MyChart. We now offer e-Visits for anyone 18 and older to request care online for non-urgent symptoms. For details visit [email or web address].   Also download the MyChart app! Go to the app store, search "MyChart", open the app, select [place], and log in with your MyChart username and password.   

## 2022-10-09 NOTE — Progress Notes (Signed)
Patient is taking Revlimid as prescribed.  He has not missed any doses and reports no side effects at this time.    Patient has been examined by Dr. Delton Coombes. Vital signs (BP 206/77) and labs have been reviewed by MD - ANC, Creatinine, LFTs, hemoglobin, and platelets are within treatment parameters per M.D. - pt may proceed with treatment.  Primary RN and pharmacy notified.

## 2022-10-09 NOTE — Patient Instructions (Signed)
[place] at [facility] Discharge Instructions   You were seen and examined today by Dr. Delton Coombes.  He reviewed the results of your lab work which are normal/stable.   We will proceed with your treatment today.   Continue Revlimid as prescribed.   Return as scheduled.    Thank you for choosing [place] at [facility] to provide your oncology and hematology care.  To afford each patient quality time with our provider, please arrive at least 15 minutes before your scheduled appointment time.   If you have a lab appointment with the [place] please come in thru the Main Entrance and check in at the main information desk.  You need to re-schedule your appointment should you arrive 10 or more minutes late.  We strive to give you quality time with our providers, and arriving late affects you and other patients whose appointments are after yours.  Also, if you no show three or more times for appointments you may be dismissed from the clinic at the providers discretion.     Again, thank you for choosing [facility].  Our hope is that these requests will decrease the amount of time that you wait before being seen by our physicians.       _____________________________________________________________  Should you have questions after your visit to [facility], please contact our office at [phone] and follow the prompts.  Our office hours are 8:00 a.m. and 4:30 p.m. Monday - Friday.  Please note that voicemails left after 4:00 p.m. may not be returned until the following business day.  We are closed weekends and major holidays.  You do have access to a nurse 24-7, just call the main number to the clinic [phone] and do not press any options, hold on the line and a nurse will answer the phone.    For prescription refill requests, have your pharmacy contact our office and allow 72 hours.    Due to Covid, you  will need to wear a mask upon entering the hospital. If you do not have a mask, a mask will be given to you at the Main Entrance upon arrival. For doctor visits, patients may have 1 support person age 60 or older with them. For treatment visits, patients can not have anyone with them due to social distancing guidelines and our immunocompromised population.

## 2022-10-10 LAB — KAPPA/LAMBDA LIGHT CHAINS
Kappa free light chain: 9.7 mg/L (ref 3.3–19.4)
Kappa, lambda light chain ratio: 2.11 — ABNORMAL HIGH (ref 0.26–1.65)
Lambda free light chains: 4.6 mg/L — ABNORMAL LOW (ref 5.7–26.3)

## 2022-10-11 LAB — PROTEIN ELECTROPHORESIS, SERUM
A/G Ratio: 2 — ABNORMAL HIGH (ref 0.7–1.7)
Albumin ELP: 4.1 g/dL (ref 2.9–4.4)
Alpha-1-Globulin: 0.2 g/dL (ref 0.0–0.4)
Alpha-2-Globulin: 0.6 g/dL (ref 0.4–1.0)
Beta Globulin: 0.9 g/dL (ref 0.7–1.3)
Gamma Globulin: 0.3 g/dL — ABNORMAL LOW (ref 0.4–1.8)
Globulin, Total: 2.1 g/dL — ABNORMAL LOW (ref 2.2–3.9)
M-Spike, %: 0.1 g/dL — ABNORMAL HIGH
Total Protein ELP: 6.2 g/dL (ref 6.0–8.5)

## 2022-10-15 LAB — IMMUNOFIXATION ELECTROPHORESIS
IgA: 25 mg/dL — ABNORMAL LOW (ref 61–437)
IgG (Immunoglobin G), Serum: 332 mg/dL — ABNORMAL LOW (ref 603–1613)
IgM (Immunoglobulin M), Srm: 6 mg/dL — ABNORMAL LOW (ref 20–172)
Total Protein ELP: 6.1 g/dL (ref 6.0–8.5)

## 2022-10-16 ENCOUNTER — Other Ambulatory Visit: Payer: Self-pay

## 2022-10-17 ENCOUNTER — Other Ambulatory Visit: Payer: Self-pay

## 2022-10-17 MED ORDER — LENALIDOMIDE 10 MG PO CAPS: 10.0000 mg | ORAL_CAPSULE | Freq: Every day | ORAL | 0 refills | Status: DC

## 2022-10-17 NOTE — Telephone Encounter (Signed)
Chart reviewed. Revlimid refilled per last office note with Dr. Katragadda.  

## 2022-10-28 ENCOUNTER — Other Ambulatory Visit: Payer: Self-pay

## 2022-11-05 ENCOUNTER — Other Ambulatory Visit: Payer: Self-pay

## 2022-11-05 DIAGNOSIS — C9000 Multiple myeloma not having achieved remission: Secondary | ICD-10-CM

## 2022-11-06 ENCOUNTER — Inpatient Hospital Stay: Payer: 59

## 2022-11-06 ENCOUNTER — Ambulatory Visit: Payer: 59 | Admitting: Hematology

## 2022-11-06 ENCOUNTER — Inpatient Hospital Stay: Payer: 59 | Attending: Hematology

## 2022-11-06 VITALS — BP 164/66 | HR 80 | Temp 97.5°F | Resp 18 | Wt 195.4 lb

## 2022-11-06 DIAGNOSIS — Z79899 Other long term (current) drug therapy: Secondary | ICD-10-CM | POA: Diagnosis not present

## 2022-11-06 DIAGNOSIS — C9000 Multiple myeloma not having achieved remission: Secondary | ICD-10-CM

## 2022-11-06 DIAGNOSIS — Z5112 Encounter for antineoplastic immunotherapy: Secondary | ICD-10-CM | POA: Insufficient documentation

## 2022-11-06 LAB — COMPREHENSIVE METABOLIC PANEL
ALT: 14 U/L (ref 0–44)
AST: 16 U/L (ref 15–41)
Albumin: 4.1 g/dL (ref 3.5–5.0)
Alkaline Phosphatase: 73 U/L (ref 38–126)
Anion gap: 10 (ref 5–15)
BUN: 22 mg/dL (ref 8–23)
CO2: 27 mmol/L (ref 22–32)
Calcium: 8.7 mg/dL — ABNORMAL LOW (ref 8.9–10.3)
Chloride: 100 mmol/L (ref 98–111)
Creatinine, Ser: 1.13 mg/dL (ref 0.61–1.24)
GFR, Estimated: >60 mL/min (ref >60)
Glucose, Bld: 133 mg/dL — ABNORMAL HIGH (ref 70–99)
Potassium: 3.8 mmol/L (ref 3.5–5.1)
Sodium: 137 mmol/L (ref 135–145)
Total Bilirubin: 1.3 mg/dL — ABNORMAL HIGH (ref 0.3–1.2)
Total Protein: 6.7 g/dL (ref 6.5–8.1)

## 2022-11-06 LAB — MAGNESIUM: Magnesium: 1.9 mg/dL (ref 1.7–2.4)

## 2022-11-06 LAB — CBC WITH DIFFERENTIAL/PLATELET
Abs Immature Granulocytes: 0.03 10*3/uL (ref 0.00–0.07)
Basophils Absolute: 0.1 10*3/uL (ref 0.0–0.1)
Basophils Relative: 1 %
Eosinophils Absolute: 0.2 10*3/uL (ref 0.0–0.5)
Eosinophils Relative: 2 %
HCT: 44.2 % (ref 39.0–52.0)
Hemoglobin: 14.7 g/dL (ref 13.0–17.0)
Immature Granulocytes: 0 %
Lymphocytes Relative: 13 %
Lymphs Abs: 1 10*3/uL (ref 0.7–4.0)
MCH: 27.6 pg (ref 26.0–34.0)
MCHC: 33.3 g/dL (ref 30.0–36.0)
MCV: 83.1 fL (ref 80.0–100.0)
Monocytes Absolute: 1.1 10*3/uL — ABNORMAL HIGH (ref 0.1–1.0)
Monocytes Relative: 14 %
Neutro Abs: 5.5 10*3/uL (ref 1.7–7.7)
Neutrophils Relative %: 70 %
Platelets: 177 10*3/uL (ref 150–400)
RBC: 5.32 MIL/uL (ref 4.22–5.81)
RDW: 15.5 % (ref 11.5–15.5)
WBC: 7.9 10*3/uL (ref 4.0–10.5)
nRBC: 0 % (ref 0.0–0.2)

## 2022-11-06 MED ORDER — DARATUMUMAB-HYALURONIDASE-FIHJ 1800-30000 MG-UT/15ML ~~LOC~~ SOLN
1800.0000 mg | Freq: Once | SUBCUTANEOUS | Status: AC
  Administered 2022-11-06: 1800 mg via SUBCUTANEOUS
  Filled 2022-11-06: qty 15

## 2022-11-06 MED ORDER — DEXAMETHASONE 4 MG PO TABS
40.0000 mg | ORAL_TABLET | Freq: Once | ORAL | Status: AC
  Administered 2022-11-06: 40 mg via ORAL

## 2022-11-06 MED ORDER — CETIRIZINE HCL 10 MG PO TABS
10.0000 mg | ORAL_TABLET | Freq: Once | ORAL | Status: AC
  Administered 2022-11-06: 10 mg via ORAL
  Filled 2022-11-06: qty 1

## 2022-11-06 MED ORDER — DENOSUMAB 120 MG/1.7ML ~~LOC~~ SOLN
120.0000 mg | Freq: Once | SUBCUTANEOUS | Status: AC
  Administered 2022-11-06: 120 mg via SUBCUTANEOUS
  Filled 2022-11-06: qty 1.7

## 2022-11-06 MED ORDER — ACETAMINOPHEN 325 MG PO TABS
650.0000 mg | ORAL_TABLET | Freq: Once | ORAL | Status: AC
  Administered 2022-11-06: 650 mg via ORAL
  Filled 2022-11-06: qty 2

## 2022-11-06 NOTE — Progress Notes (Signed)
Patient presents today for Dara [place] infusion and Xgeva injection. Patient is in satisfactory condition with no new complaints voiced.  Vital signs are stable.  Labs reviewed and all labs are within treatment parameters. We will proceed with treatment per MD orders.    Pt's Calcium noted to be 8.7 today, pt denies any tooth or jaw pain. No recent or future dental appointment at this time. Pt reports taking Calcium and Vit D supplements as directed.   Dara [place] and Rivka Barbara given today per MD orders. Tolerated infusion without adverse affects. Vital signs stable. No complaints at this time. Discharged from clinic ambulatory in stable condition. Alert and oriented x 3. F/U with [facility] as scheduled.

## 2022-11-06 NOTE — Patient Instructions (Signed)
[facility]  Discharge Instructions: Thank you for choosing [facility] to provide your oncology and hematology care.  If you have a lab appointment with the Cancer Center - please note that after April 8th, 2024, all labs will be drawn in the cancer center.  You do not have to check in or register with the main entrance as you have in the past but will complete your check-in in the cancer center.  Wear comfortable clothing and clothing appropriate for easy access to any Portacath or PICC line.   We strive to give you quality time with your provider. You may need to reschedule your appointment if you arrive late (15 or more minutes).  Arriving late affects you and other patients whose appointments are after yours.  Also, if you miss three or more appointments without notifying the office, you may be dismissed from the clinic at the provider's discretion.      For prescription refill requests, have your pharmacy contact our office and allow 72 hours for refills to be completed.    Today you received the following chemotherapy and/or immunotherapy agents Dara [place] and Xgeva     To help prevent nausea and vomiting after your treatment, we encourage you to take your nausea medication as directed.  Daratumumab Injection What is this medication? DARATUMUMAB (dar a toom ue mab) treats multiple myeloma, a type of bone marrow cancer. It works by helping your immune system slow or stop the spread of cancer cells. It is a monoclonal antibody. This medicine may be used for other purposes; ask your health care provider or pharmacist if you have questions. COMMON BRAND NAME(S): DARZALEX What should I tell my care team before I take this medication? They need to know if you have any of these conditions: Hereditary fructose intolerance Infection, such as chickenpox, herpes, hepatitis B Lung or breathing disease, such as asthma, COPD An unusual or allergic reaction to  daratumumab, sorbitol, other medications, foods, dyes, or preservatives Pregnant or trying to get pregnant Breastfeeding How should I use this medication? This medication is injected into a vein. It is given by your care team in a hospital or clinic setting. Talk to your care team about the use of this medication in children. Special care may be needed. Overdosage: If you think you have taken too much of this medicine contact a poison control center or emergency room at once. NOTE: This medicine is only for you. Do not share this medicine with others. What if I miss a dose? Keep appointments for follow-up doses. It is important not to miss your dose. Call your care team if you are unable to keep an appointment. What may interact with this medication? Interactions have not been studied. This list may not describe all possible interactions. Give your health care provider a list of all the medicines, herbs, non-prescription drugs, or dietary supplements you use. Also tell them if you smoke, drink alcohol, or use illegal drugs. Some items may interact with your medicine. What should I watch for while using this medication? Your condition will be monitored carefully while you are receiving this medication. This medication can cause serious allergic reactions. To reduce your risk, your care team may give you other medication to take before receiving this one. Be sure to follow the directions from your care team. This medication can affect the results of blood tests to match your blood type. These changes can last for up to 6 months after the  final dose. Your care team will do blood tests to match your blood type before you start treatment. Tell all of your care team that you are being treated with this medication before receiving a blood transfusion. This medication can affect the results of some tests used to determine treatment response; extra tests may be needed to evaluate response. Talk to your care  team if you wish to become pregnant or think you are pregnant. This medication can cause serious birth defects if taken during pregnancy and for 3 months after the last dose. A reliable form of contraception is recommended while taking this medication and for 3 months after the last dose. Talk to your care team about effective forms of contraception. Do not breast-feed while taking this medication. What side effects may I notice from receiving this medication? Side effects that you should report to your care team as soon as possible: Allergic reactions--skin rash, itching, hives, swelling of the face, lips, tongue, or throat Infection--fever, chills, cough, sore throat, wounds that don't heal, pain or trouble when passing urine, general feeling of discomfort or being unwell Infusion reactions--chest pain, shortness of breath or trouble breathing, feeling faint or lightheaded Unusual bruising or bleeding Side effects that usually do not require medical attention (report to your care team if they continue or are bothersome): Constipation Diarrhea Fatigue Nausea Pain, tingling, or numbness in the hands or feet Swelling of the ankles, hands, or feet This list may not describe all possible side effects. Call your doctor for medical advice about side effects. You may report side effects to FDA at 1-800-FDA-1088. Where should I keep my medication? This medication is given in a hospital or clinic. It will not be stored at home. NOTE: This sheet is a summary. It may not cover all possible information. If you have questions about this medicine, talk to your doctor, pharmacist, or health care provider.  2023 Elsevier/Gold Standard (2021-10-25 00:00:00)  BELOW ARE SYMPTOMS THAT SHOULD BE REPORTED IMMEDIATELY: *FEVER GREATER THAN 100.4 F (38 C) OR HIGHER *CHILLS OR SWEATING *NAUSEA AND VOMITING THAT IS NOT CONTROLLED WITH YOUR NAUSEA MEDICATION *UNUSUAL SHORTNESS OF BREATH *UNUSUAL BRUISING OR  BLEEDING *URINARY PROBLEMS (pain or burning when urinating, or frequent urination) *BOWEL PROBLEMS (unusual diarrhea, constipation, pain near the anus) TENDERNESS IN MOUTH AND THROAT WITH OR WITHOUT PRESENCE OF ULCERS (sore throat, sores in mouth, or a toothache) UNUSUAL RASH, SWELLING OR PAIN  UNUSUAL VAGINAL DISCHARGE OR ITCHING   Items with * indicate a potential emergency and should be followed up as soon as possible or go to the Emergency Department if any problems should occur.  Please show the CHEMOTHERAPY ALERT CARD or IMMUNOTHERAPY ALERT CARD at check-in to the Emergency Department and triage nurse.  Should you have questions after your visit or need to cancel or reschedule your appointment, please contact [facility] [phone]  and follow the prompts.  Office hours are 8:00 a.m. to 4:30 p.m. Monday - Friday. Please note that voicemails left after 4:00 p.m. may not be returned until the following business day.  We are closed weekends and major holidays. You have access to a nurse at all times for urgent questions. Please call the main number to the clinic [phone] and follow the prompts.  For any non-urgent questions, you may also contact your provider using MyChart. We now offer e-Visits for anyone 23 and older to request care online for non-urgent symptoms. For details visit [email or web address].   Also download the MyChart  app! Go to the app store, search "MyChart", open the app, select [place], and log in with your MyChart username and password.

## 2022-11-08 LAB — KAPPA/LAMBDA LIGHT CHAINS
Kappa free light chain: 9.8 mg/L (ref 3.3–19.4)
Kappa, lambda light chain ratio: 1.72 — ABNORMAL HIGH (ref 0.26–1.65)
Lambda free light chains: 5.7 mg/L (ref 5.7–26.3)

## 2022-11-09 LAB — PROTEIN ELECTROPHORESIS, SERUM
A/G Ratio: 2 — ABNORMAL HIGH (ref 0.7–1.7)
Albumin ELP: 3.9 g/dL (ref 2.9–4.4)
Alpha-1-Globulin: 0.3 g/dL (ref 0.0–0.4)
Alpha-2-Globulin: 0.7 g/dL (ref 0.4–1.0)
Beta Globulin: 0.9 g/dL (ref 0.7–1.3)
Gamma Globulin: 0.2 g/dL — ABNORMAL LOW (ref 0.4–1.8)
Globulin, Total: 2 g/dL — ABNORMAL LOW (ref 2.2–3.9)
Total Protein ELP: 5.9 g/dL — ABNORMAL LOW (ref 6.0–8.5)

## 2022-11-12 LAB — IMMUNOFIXATION ELECTROPHORESIS
IgA: 22 mg/dL — ABNORMAL LOW (ref 61–437)
IgG (Immunoglobin G), Serum: 313 mg/dL — ABNORMAL LOW (ref 603–1613)
IgM (Immunoglobulin M), Srm: 11 mg/dL — ABNORMAL LOW (ref 20–172)
Total Protein ELP: 5.8 g/dL — ABNORMAL LOW (ref 6.0–8.5)

## 2022-11-19 ENCOUNTER — Other Ambulatory Visit: Payer: Self-pay

## 2022-11-19 MED ORDER — LENALIDOMIDE 10 MG PO CAPS: 10.0000 mg | ORAL_CAPSULE | Freq: Every day | ORAL | 0 refills | Status: DC

## 2022-11-19 NOTE — Telephone Encounter (Signed)
Chart reviewed. Revlimid refilled per last office note with Dr. Katragadda.  

## 2022-11-29 ENCOUNTER — Other Ambulatory Visit: Payer: Self-pay

## 2022-11-29 DIAGNOSIS — C9000 Multiple myeloma not having achieved remission: Secondary | ICD-10-CM

## 2022-12-01 ENCOUNTER — Other Ambulatory Visit: Payer: Self-pay

## 2022-12-03 ENCOUNTER — Inpatient Hospital Stay: Payer: 59 | Attending: Hematology

## 2022-12-03 ENCOUNTER — Ambulatory Visit: Payer: 59 | Admitting: Hematology

## 2022-12-03 ENCOUNTER — Inpatient Hospital Stay: Payer: 59

## 2022-12-03 VITALS — BP 170/82 | HR 70 | Temp 97.6°F | Resp 18

## 2022-12-03 DIAGNOSIS — M1990 Unspecified osteoarthritis, unspecified site: Secondary | ICD-10-CM | POA: Diagnosis not present

## 2022-12-03 DIAGNOSIS — J449 Chronic obstructive pulmonary disease, unspecified: Secondary | ICD-10-CM | POA: Diagnosis not present

## 2022-12-03 DIAGNOSIS — Z5112 Encounter for antineoplastic immunotherapy: Secondary | ICD-10-CM | POA: Insufficient documentation

## 2022-12-03 DIAGNOSIS — I10 Essential (primary) hypertension: Secondary | ICD-10-CM | POA: Diagnosis not present

## 2022-12-03 DIAGNOSIS — F17210 Nicotine dependence, cigarettes, uncomplicated: Secondary | ICD-10-CM | POA: Diagnosis not present

## 2022-12-03 DIAGNOSIS — Z7951 Long term (current) use of inhaled steroids: Secondary | ICD-10-CM | POA: Diagnosis not present

## 2022-12-03 DIAGNOSIS — R1031 Right lower quadrant pain: Secondary | ICD-10-CM | POA: Diagnosis not present

## 2022-12-03 DIAGNOSIS — Z79899 Other long term (current) drug therapy: Secondary | ICD-10-CM | POA: Insufficient documentation

## 2022-12-03 DIAGNOSIS — E785 Hyperlipidemia, unspecified: Secondary | ICD-10-CM | POA: Diagnosis not present

## 2022-12-03 DIAGNOSIS — C9000 Multiple myeloma not having achieved remission: Secondary | ICD-10-CM

## 2022-12-03 DIAGNOSIS — R93422 Abnormal radiologic findings on diagnostic imaging of left kidney: Secondary | ICD-10-CM | POA: Diagnosis not present

## 2022-12-03 DIAGNOSIS — Z7984 Long term (current) use of oral hypoglycemic drugs: Secondary | ICD-10-CM | POA: Diagnosis not present

## 2022-12-03 DIAGNOSIS — E119 Type 2 diabetes mellitus without complications: Secondary | ICD-10-CM | POA: Diagnosis not present

## 2022-12-03 DIAGNOSIS — R918 Other nonspecific abnormal finding of lung field: Secondary | ICD-10-CM | POA: Diagnosis not present

## 2022-12-03 DIAGNOSIS — G893 Neoplasm related pain (acute) (chronic): Secondary | ICD-10-CM | POA: Diagnosis not present

## 2022-12-03 LAB — CBC WITH DIFFERENTIAL/PLATELET
Abs Immature Granulocytes: 0.05 10*3/uL (ref 0.00–0.07)
Basophils Absolute: 0.1 10*3/uL (ref 0.0–0.1)
Basophils Relative: 1 %
Eosinophils Absolute: 0.1 10*3/uL (ref 0.0–0.5)
Eosinophils Relative: 2 %
HCT: 46.3 % (ref 39.0–52.0)
Hemoglobin: 15.4 g/dL (ref 13.0–17.0)
Immature Granulocytes: 1 %
Lymphocytes Relative: 10 %
Lymphs Abs: 0.8 10*3/uL (ref 0.7–4.0)
MCH: 27.6 pg (ref 26.0–34.0)
MCHC: 33.3 g/dL (ref 30.0–36.0)
MCV: 83 fL (ref 80.0–100.0)
Monocytes Absolute: 1.2 10*3/uL — ABNORMAL HIGH (ref 0.1–1.0)
Monocytes Relative: 16 %
Neutro Abs: 5.4 10*3/uL (ref 1.7–7.7)
Neutrophils Relative %: 70 %
Platelets: 197 10*3/uL (ref 150–400)
RBC: 5.58 MIL/uL (ref 4.22–5.81)
RDW: 15.5 % (ref 11.5–15.5)
WBC: 7.6 10*3/uL (ref 4.0–10.5)
nRBC: 0 % (ref 0.0–0.2)

## 2022-12-03 LAB — COMPREHENSIVE METABOLIC PANEL
ALT: 19 U/L (ref 0–44)
AST: 18 U/L (ref 15–41)
Albumin: 4.2 g/dL (ref 3.5–5.0)
Alkaline Phosphatase: 69 U/L (ref 38–126)
Anion gap: 10 (ref 5–15)
BUN: 24 mg/dL — ABNORMAL HIGH (ref 8–23)
CO2: 28 mmol/L (ref 22–32)
Calcium: 9 mg/dL (ref 8.9–10.3)
Chloride: 97 mmol/L — ABNORMAL LOW (ref 98–111)
Creatinine, Ser: 1.18 mg/dL (ref 0.61–1.24)
GFR, Estimated: >60 mL/min (ref >60)
Glucose, Bld: 145 mg/dL — ABNORMAL HIGH (ref 70–99)
Potassium: 3.7 mmol/L (ref 3.5–5.1)
Sodium: 135 mmol/L (ref 135–145)
Total Bilirubin: 1.5 mg/dL — ABNORMAL HIGH (ref 0.3–1.2)
Total Protein: 6.5 g/dL (ref 6.5–8.1)

## 2022-12-03 LAB — MAGNESIUM: Magnesium: 1.9 mg/dL (ref 1.7–2.4)

## 2022-12-03 MED ORDER — DENOSUMAB 120 MG/1.7ML ~~LOC~~ SOLN
120.0000 mg | Freq: Once | SUBCUTANEOUS | Status: AC
  Administered 2022-12-03: 120 mg via SUBCUTANEOUS
  Filled 2022-12-03: qty 1.7

## 2022-12-03 MED ORDER — CETIRIZINE HCL 10 MG PO TABS
10.0000 mg | ORAL_TABLET | Freq: Once | ORAL | Status: AC
  Administered 2022-12-03: 10 mg via ORAL
  Filled 2022-12-03: qty 1

## 2022-12-03 MED ORDER — ACETAMINOPHEN 325 MG PO TABS
650.0000 mg | ORAL_TABLET | Freq: Once | ORAL | Status: AC
  Administered 2022-12-03: 650 mg via ORAL
  Filled 2022-12-03: qty 2

## 2022-12-03 MED ORDER — DARATUMUMAB-HYALURONIDASE-FIHJ 1800-30000 MG-UT/15ML ~~LOC~~ SOLN
1800.0000 mg | Freq: Once | SUBCUTANEOUS | Status: AC
  Administered 2022-12-03: 1800 mg via SUBCUTANEOUS
  Filled 2022-12-03: qty 15

## 2022-12-03 MED ORDER — DEXAMETHASONE 4 MG PO TABS
40.0000 mg | ORAL_TABLET | Freq: Once | ORAL | Status: AC
  Administered 2022-12-03: 40 mg via ORAL
  Filled 2022-12-03: qty 10

## 2022-12-04 LAB — KAPPA/LAMBDA LIGHT CHAINS
Kappa free light chain: 8.4 mg/L (ref 3.3–19.4)
Kappa, lambda light chain ratio: 1.31 (ref 0.26–1.65)
Lambda free light chains: 6.4 mg/L (ref 5.7–26.3)

## 2022-12-06 LAB — PROTEIN ELECTROPHORESIS, SERUM
A/G Ratio: 1.5 (ref 0.7–1.7)
Albumin ELP: 3.7 g/dL (ref 2.9–4.4)
Alpha-1-Globulin: 0.3 g/dL (ref 0.0–0.4)
Alpha-2-Globulin: 0.8 g/dL (ref 0.4–1.0)
Beta Globulin: 1 g/dL (ref 0.7–1.3)
Gamma Globulin: 0.3 g/dL — ABNORMAL LOW (ref 0.4–1.8)
Globulin, Total: 2.4 g/dL (ref 2.2–3.9)
M-Spike, %: 0.1 g/dL — ABNORMAL HIGH
Total Protein ELP: 6.1 g/dL (ref 6.0–8.5)

## 2022-12-11 ENCOUNTER — Other Ambulatory Visit: Payer: Self-pay

## 2022-12-11 MED ORDER — LENALIDOMIDE 10 MG PO CAPS: 10.0000 mg | ORAL_CAPSULE | Freq: Every day | ORAL | 0 refills | Status: DC

## 2022-12-11 NOTE — Telephone Encounter (Signed)
Chart reviewed. Revlimid refilled per last office note with Dr. Katragadda.  

## 2022-12-13 LAB — IMMUNOFIXATION ELECTROPHORESIS
IgA: 25 mg/dL — ABNORMAL LOW (ref 61–437)
IgG (Immunoglobin G), Serum: 336 mg/dL — ABNORMAL LOW (ref 603–1613)
IgM (Immunoglobulin M), Srm: 7 mg/dL — ABNORMAL LOW (ref 20–172)
Total Protein ELP: 6.2 g/dL (ref 6.0–8.5)

## 2022-12-31 ENCOUNTER — Other Ambulatory Visit: Payer: Self-pay

## 2022-12-31 DIAGNOSIS — C9000 Multiple myeloma not having achieved remission: Secondary | ICD-10-CM

## 2022-12-31 NOTE — Progress Notes (Signed)
[facility] [address], [place] [address]    Clinic Day:  01/01/2023  Referring physician: Doreatha Massed, MD  Patient Care Team: Doreatha Massed, MD as PCP - General (Hematology) Doreatha Massed, MD as Medical Oncologist (Oncology)   ASSESSMENT & PLAN:   Assessment: 1.  IgG kappa light chain multiple myeloma: - Admission with hypercalcemia and renal failure. - CT CAP showed extensive lytic foci throughout the axial and appendicular skeleton of the chest, abdomen and pelvis.  Splenomegaly with an enlarged lobular spleen, nonspecific. - Bone marrow biopsy on 11/01/2020 shows 85% atypical plasma cells in the aspirate. - FISH panel positive for 1 p-,-13,14q-,16q-,17p-/-17 and 20q- - Cytogenetics-no metaphases available for analysis. - SPEP with 5.4 g of M spike.  Free light chain ratio 1030.  LDH normal.  Beta-2 microglobulin 18.7.  24-hour urine total protein 3.6 g. - 6 cycles of Dara VRD from 11/21/2020 through 03/07/2021. - Patient declined bone marrow transplant. - Maintenance Revlimid and monthly daratumumab started on 03/27/2021.   2.  Social/family history: - He worked as a Naval architect.  He lives by himself. - Mother with breast and ovarian cancer.  Father had metastatic kidney cancer.  His brother's daughter has CML.    Plan: 1.  IgG kappa light chain multiple myeloma, high risk: - He is tolerating Revlimid very well.  No recent infections noted. - Reviewed myeloma labs from 12/03/2022: M spike is 0.1 g.  Free light chain ratio is 1.31.  Immunofixation shows IgG kappa.  Overall they are stable. - Labs from today: Normal CBC.  LFTs show elevated total bilirubin 1.7. - Continue Revlimid 10 mg 3 weeks on/1 week off, dexamethasone on the days of Darzalex and monthly Darzalex. - RTC 3 months for follow-up with repeat myeloma labs.   2.  Myeloma bone disease: - Calcium is normal.  Continue calcium and D supplements. - Continue denosumab  monthly.  3.  ID prophylaxis: - Continue acyclovir twice daily and aspirin 81 mg daily.    No orders of the defined types were placed in this encounter.     I,Katie Daubenspeck,acting as a Neurosurgeon for Doreatha Massed, MD.,have documented all relevant documentation on the behalf of Doreatha Massed, MD,as directed by  Doreatha Massed, MD while in the presence of Doreatha Massed, MD.   I, Doreatha Massed MD, have reviewed the above documentation for accuracy and completeness, and I agree with the above.   Doreatha Massed, MD   6/18/20241:37 PM  CHIEF COMPLAINT:   Diagnosis: multiple myeloma    Cancer Staging  No matching staging information was found for the patient.   Prior Therapy: Dara CyBorD   Current Therapy:  Maintenance Revlimid and daratumumab    HISTORY OF PRESENT ILLNESS:   Oncology History  Multiple myeloma not having achieved remission (HCC)  11/01/2020 Initial Diagnosis   Multiple myeloma not having achieved remission (HCC)   11/02/2020 - 11/02/2020 Chemotherapy         11/16/2020 -  Chemotherapy   Patient is on Treatment Plan : MYELOMA NEWLY DIAGNOSED TRANSPLANT CANDIDATE DaraVRd (Daratumumab SQ) q21d x 6 Cycles (Induction/Consolidation)     11/21/2020 - 02/27/2022 Chemotherapy   Patient is on Treatment Plan : MYELOMA NEWLY DIAGNOSED TRANSPLANT CANDIDATE DaraVRd (Daratumumab SQ) q21d x 6 Cycles (Induction/Consolidation)     04/24/2021 - 04/24/2021 Chemotherapy   Patient is on Treatment Plan : MYELOMA NEWLY DIAGNOSED TRANSPLANT CANDIDATE Daratumumab SQ + Lenalidomide q28d (Maintenance)  INTERVAL HISTORY:   Paul Norton is a 67 y.o. male presenting to clinic today for follow up of multiple myeloma. He was last seen by me on 10/09/22.  Today, he states that he is doing well overall. His appetite level is at 100%. His energy level is at 90%.  PAST MEDICAL HISTORY:   Past Medical History: Past Medical History:  Diagnosis Date    Broken leg    Colonoscopy refused 09/2017   Hyperlipidemia    Hypertension    Multiple myeloma (HCC)    Pneumonia    Refuses treatment 09/2017   refuses screenings such as cancer screening, refuses vaccines, refuses normal preventative care   Vaccine refused by patient    all vaccines as of 09/2017    Surgical History: Past Surgical History:  Procedure Laterality Date   COLONOSCOPY     never, declines as of 09/2017    Social History: Social History   Socioeconomic History   Marital status: Divorced    Spouse name: Not on file   Number of children: 1   Years of education: Not on file   Highest education level: Not on file  Occupational History   Occupation: Disability  Tobacco Use   Smoking status: Never   Smokeless tobacco: Never  Substance and Sexual Activity   Alcohol use: No   Drug use: Never   Sexual activity: Not Currently  Other Topics Concern   Not on file  Social History Narrative   Not on file   Social Determinants of Health   Financial Resource Strain: Medium Risk (11/15/2020)   Overall Financial Resource Strain (CARDIA)    Difficulty of Paying Living Expenses: Somewhat hard  Food Insecurity: No Food Insecurity (11/15/2020)   Hunger Vital Sign    Worried About Running Out of Food in the Last Year: Never true    Ran Out of Food in the Last Year: Never true  Transportation Needs: No Transportation Needs (11/15/2020)   PRAPARE - Administrator, Civil Service (Medical): No    Lack of Transportation (Non-Medical): No  Physical Activity: Insufficiently Active (11/21/2020)   Exercise Vital Sign    Days of Exercise per Week: 5 days    Minutes of Exercise per Session: 20 min  Stress: No Stress Concern Present (11/21/2020)   Paul [facility] [employer] Occupational [employer] - Occupational Stress Questionnaire    Feeling of Stress : Not at all  Social Connections: Socially Isolated (11/21/2020)   Social Connection and Isolation Panel [NHANES]    Frequency of  Communication with Friends and Family: More than three times a week    Frequency of Social Gatherings with Friends and Family: More than three times a week    Attends Religious Services: Never    Database administrator or Organizations: No    Attends Banker Meetings: Never    Marital Status: Divorced  Catering manager Violence: Not At Risk (11/21/2020)   Humiliation, Afraid, Rape, and Kick questionnaire    Fear of Current or Ex-Partner: No    Emotionally Abused: No    Physically Abused: No    Sexually Abused: No    Family History: No family history on file.  Current Medications:  Current Outpatient Medications:    acetaminophen (TYLENOL) 325 MG tablet, Take 2 tablets (650 mg total) by mouth every 6 (six) hours as needed for mild pain (or Fever >/= 101)., Disp: 30 tablet, Rfl: 30   acyclovir (ZOVIRAX) 400 MG tablet, Take 1 tablet (400 mg  total) by mouth 2 (two) times daily., Disp: 60 tablet, Rfl: 6   amLODipine (NORVASC) 10 MG tablet, TAKE ONE TABLET BY MOUTH ONCE DAILY, Disp: 30 tablet, Rfl: 3   aspirin 81 MG chewable tablet, Chew by mouth daily., Disp: , Rfl:    Calcium Carb-Cholecalciferol (OYSTER SHELL CALCIUM W/D) 500-5 MG-MCG TABS, TAKE ONE TABLET BY MOUTH DAILY, Disp: 30 tablet, Rfl: 3   feeding supplement (ENSURE ENLIVE / ENSURE PLUS) LIQD, Take 237 mLs by mouth 3 (three) times daily between meals., Disp: 237 mL, Rfl: 12   lenalidomide (REVLIMID) 10 MG capsule, Take 1 capsule (10 mg total) by mouth daily. 21 days on, 7 days off, Disp: 21 capsule, Rfl: 0 No current facility-administered medications for this visit.  Facility-Administered Medications Ordered in Other Visits:    acetaminophen (TYLENOL) tablet 650 mg, 650 mg, Oral, Once, Doreatha Massed, MD   cetirizine (ZYRTEC) tablet 10 mg, 10 mg, Oral, Once, Doreatha Massed, MD   daratumumab-hyaluronidase-fihj [facility] FASPRO) 1800-30000 MG-UT/15ML chemo SQ injection 1,800 mg, 1,800 mg, Subcutaneous, Once,  Doreatha Massed, MD   denosumab (XGEVA) injection 120 mg, 120 mg, Subcutaneous, Once, Doreatha Massed, MD   dexamethasone (DECADRON) tablet 40 mg, 40 mg, Oral, Once, Doreatha Massed, MD   Allergies: No Known Allergies  REVIEW OF SYSTEMS:   Review of Systems  Constitutional:  Negative for chills, fatigue and fever.  HENT:   Negative for lump/mass, mouth sores, nosebleeds, sore throat and trouble swallowing.   Eyes:  Negative for eye problems.  Respiratory:  Negative for cough and shortness of breath.   Cardiovascular:  Negative for chest pain, leg swelling and palpitations.  Gastrointestinal:  Negative for abdominal pain, constipation, diarrhea, nausea and vomiting.  Genitourinary:  Negative for bladder incontinence, difficulty urinating, dysuria, frequency, hematuria and nocturia.   Musculoskeletal:  Negative for arthralgias, back pain, flank pain, myalgias and neck pain.  Skin:  Negative for itching and rash.  Neurological:  Negative for dizziness, headaches and numbness.  Hematological:  Does not bruise/bleed easily.  Psychiatric/Behavioral:  Negative for depression, sleep disturbance and suicidal ideas. The patient is not nervous/anxious.   All other systems reviewed and are negative.    VITALS:   There were no vitals taken for this visit.  Wt Readings from Last 3 Encounters:  11/06/22 195 lb 6.4 oz (88.6 kg)  10/09/22 192 lb (87.1 kg)  08/14/22 191 lb 8 oz (86.9 kg)    There is no height or weight on file to calculate BMI.  Performance status (ECOG): 1 - Symptomatic but completely ambulatory  PHYSICAL EXAM:   Physical Exam Vitals and nursing note reviewed. Exam conducted with a chaperone present.  Constitutional:      Appearance: Normal appearance.  Cardiovascular:     Rate and Rhythm: Normal rate and regular rhythm.     Pulses: Normal pulses.     Heart sounds: Normal heart sounds.  Pulmonary:     Effort: Pulmonary effort is normal.     Breath  sounds: Normal breath sounds.  Abdominal:     Palpations: Abdomen is soft. There is no hepatomegaly, splenomegaly or mass.     Tenderness: There is no abdominal tenderness.  Musculoskeletal:     Right lower leg: No edema.     Left lower leg: No edema.  Lymphadenopathy:     Cervical: No cervical adenopathy.     Right cervical: No superficial, deep or posterior cervical adenopathy.    Left cervical: No superficial, deep or posterior cervical adenopathy.  Upper Body:     Right upper body: No supraclavicular or axillary adenopathy.     Left upper body: No supraclavicular or axillary adenopathy.  Neurological:     General: No focal deficit present.     Mental Status: He is alert and oriented to person, place, and time.  Psychiatric:        Mood and Affect: Mood normal.        Behavior: Behavior normal.     LABS:      Latest Ref Rng & Units 01/01/2023   12:29 PM 12/03/2022   11:06 AM 11/06/2022    1:00 PM  CBC  WBC 4.0 - 10.5 K/uL 7.1  7.6  7.9   Hemoglobin 13.0 - 17.0 g/dL 16.1  09.6  04.5   Hematocrit 39.0 - 52.0 % 44.2  46.3  44.2   Platelets 150 - 400 K/uL 187  197  177       Latest Ref Rng & Units 01/01/2023   12:29 PM 12/03/2022   11:06 AM 11/06/2022    1:00 PM  CMP  Glucose 70 - 99 mg/dL 409  811  914   BUN 8 - 23 mg/dL 23  24  22    Creatinine 0.61 - 1.24 mg/dL 7.82  9.56  2.13   Sodium 135 - 145 mmol/L 136  135  137   Potassium 3.5 - 5.1 mmol/L 3.9  3.7  3.8   Chloride 98 - 111 mmol/L 98  97  100   CO2 22 - 32 mmol/L 28  28  27    Calcium 8.9 - 10.3 mg/dL 8.5  9.0  8.7   Total Protein 6.5 - 8.1 g/dL 6.4  6.5  6.7   Total Bilirubin 0.3 - 1.2 mg/dL 1.7  1.5  1.3   Alkaline Phos 38 - 126 U/L 64  69  73   AST 15 - 41 U/L 17  18  16    ALT 0 - 44 U/L 16  19  14       Lab Results  Component Value Date   CEA1 1.1 10/30/2020   /  CEA  Date Value Ref Range Status  10/30/2020 1.1 0.0 - 4.7 ng/mL Final    Comment:    (NOTE)                             Nonsmokers           <3.9                             Smokers             <5.6 Roche [employer]ostics Electrochemiluminescence Immunoassay (ECLIA) Values obtained with different assay methods or kits cannot be used interchangeably.  Results cannot be interpreted as absolute evidence of the presence or absence of malignant disease. Performed At: [facility] [address] [place], [place] [id number] Jolene Schimke MD [id number]    No results found for: "PSA1" No results found for: "CAN199" No results found for: "CAN125"  Lab Results  Component Value Date   TOTALPROTELP 6.2 12/03/2022   ALBUMINELP 3.7 12/03/2022   A1GS 0.3 12/03/2022   A2GS 0.8 12/03/2022   BETS 1.0 12/03/2022   GAMS 0.3 (L) 12/03/2022   MSPIKE 0.1 (H) 12/03/2022   SPEI Comment 12/03/2022   Lab Results  Component Value Date   TIBC 242 (L) 10/30/2020  TIBC 266 10/29/2020   FERRITIN 1,406 (H) 10/30/2020   FERRITIN 627 (H) 10/29/2020   IRONPCTSAT 42 (H) 10/30/2020   IRONPCTSAT 47 (H) 10/29/2020   Lab Results  Component Value Date   LDH 113 01/30/2022   LDH 107 01/02/2022   LDH 135 06/19/2021     STUDIES:   No results found.

## 2023-01-01 ENCOUNTER — Inpatient Hospital Stay: Payer: 59

## 2023-01-01 ENCOUNTER — Inpatient Hospital Stay (HOSPITAL_BASED_OUTPATIENT_CLINIC_OR_DEPARTMENT_OTHER): Payer: 59 | Admitting: Hematology

## 2023-01-01 ENCOUNTER — Inpatient Hospital Stay: Payer: 59 | Attending: Hematology

## 2023-01-01 VITALS — BP 160/71 | HR 59 | Temp 98.6°F | Resp 18

## 2023-01-01 DIAGNOSIS — Z79899 Other long term (current) drug therapy: Secondary | ICD-10-CM | POA: Diagnosis not present

## 2023-01-01 DIAGNOSIS — C9000 Multiple myeloma not having achieved remission: Secondary | ICD-10-CM

## 2023-01-01 DIAGNOSIS — Z5112 Encounter for antineoplastic immunotherapy: Secondary | ICD-10-CM | POA: Insufficient documentation

## 2023-01-01 LAB — CBC WITH DIFFERENTIAL/PLATELET
Abs Immature Granulocytes: 0.04 10*3/uL (ref 0.00–0.07)
Basophils Absolute: 0.1 10*3/uL (ref 0.0–0.1)
Basophils Relative: 1 %
Eosinophils Absolute: 0.1 10*3/uL (ref 0.0–0.5)
Eosinophils Relative: 2 %
HCT: 44.2 % (ref 39.0–52.0)
Hemoglobin: 14.5 g/dL (ref 13.0–17.0)
Immature Granulocytes: 1 %
Lymphocytes Relative: 15 %
Lymphs Abs: 1.1 10*3/uL (ref 0.7–4.0)
MCH: 27.3 pg (ref 26.0–34.0)
MCHC: 32.8 g/dL (ref 30.0–36.0)
MCV: 83.1 fL (ref 80.0–100.0)
Monocytes Absolute: 1 10*3/uL (ref 0.1–1.0)
Monocytes Relative: 14 %
Neutro Abs: 4.8 10*3/uL (ref 1.7–7.7)
Neutrophils Relative %: 67 %
Platelets: 187 10*3/uL (ref 150–400)
RBC: 5.32 MIL/uL (ref 4.22–5.81)
RDW: 15.6 % — ABNORMAL HIGH (ref 11.5–15.5)
WBC: 7.1 10*3/uL (ref 4.0–10.5)
nRBC: 0 % (ref 0.0–0.2)

## 2023-01-01 LAB — COMPREHENSIVE METABOLIC PANEL
ALT: 16 U/L (ref 0–44)
AST: 17 U/L (ref 15–41)
Albumin: 4.2 g/dL (ref 3.5–5.0)
Alkaline Phosphatase: 64 U/L (ref 38–126)
Anion gap: 10 (ref 5–15)
BUN: 23 mg/dL (ref 8–23)
CO2: 28 mmol/L (ref 22–32)
Calcium: 8.5 mg/dL — ABNORMAL LOW (ref 8.9–10.3)
Chloride: 98 mmol/L (ref 98–111)
Creatinine, Ser: 1.18 mg/dL (ref 0.61–1.24)
GFR, Estimated: >60 mL/min (ref >60)
Glucose, Bld: 136 mg/dL — ABNORMAL HIGH (ref 70–99)
Potassium: 3.9 mmol/L (ref 3.5–5.1)
Sodium: 136 mmol/L (ref 135–145)
Total Bilirubin: 1.7 mg/dL — ABNORMAL HIGH (ref 0.3–1.2)
Total Protein: 6.4 g/dL — ABNORMAL LOW (ref 6.5–8.1)

## 2023-01-01 LAB — MAGNESIUM: Magnesium: 2 mg/dL (ref 1.7–2.4)

## 2023-01-01 MED ORDER — DENOSUMAB 120 MG/1.7ML ~~LOC~~ SOLN
120.0000 mg | Freq: Once | SUBCUTANEOUS | Status: AC
  Administered 2023-01-01: 120 mg via SUBCUTANEOUS
  Filled 2023-01-01: qty 1.7

## 2023-01-01 MED ORDER — ACETAMINOPHEN 325 MG PO TABS
650.0000 mg | ORAL_TABLET | Freq: Once | ORAL | Status: AC
  Administered 2023-01-01: 650 mg via ORAL
  Filled 2023-01-01: qty 2

## 2023-01-01 MED ORDER — CETIRIZINE HCL 10 MG PO TABS
10.0000 mg | ORAL_TABLET | Freq: Once | ORAL | Status: AC
  Administered 2023-01-01: 10 mg via ORAL
  Filled 2023-01-01: qty 1

## 2023-01-01 MED ORDER — DARATUMUMAB-HYALURONIDASE-FIHJ 1800-30000 MG-UT/15ML ~~LOC~~ SOLN
1800.0000 mg | Freq: Once | SUBCUTANEOUS | Status: AC
  Administered 2023-01-01: 1800 mg via SUBCUTANEOUS
  Filled 2023-01-01: qty 15

## 2023-01-01 MED ORDER — DEXAMETHASONE 4 MG PO TABS
40.0000 mg | ORAL_TABLET | Freq: Once | ORAL | Status: AC
  Administered 2023-01-01: 40 mg via ORAL
  Filled 2023-01-01: qty 10

## 2023-01-01 NOTE — Progress Notes (Signed)
Patient has been examined by Dr. Katragadda. Vital signs and labs have been reviewed by MD - ANC, Creatinine, LFTs, hemoglobin, and platelets are within treatment parameters per M.D. - pt may proceed with treatment.  Primary RN and pharmacy notified.  

## 2023-01-01 NOTE — Patient Instructions (Signed)
[facility] Discharge Instructions   You were seen and examined today by Dr. Katragadda.  He reviewed the results of your lab work which are normal/stable.   We will proceed with your treatment today.  Return as scheduled.    Thank you for choosing [facility] at [facility] to provide your oncology and hematology care.  To afford each patient quality time with our provider, please arrive at least 15 minutes before your scheduled appointment time.   If you have a lab appointment with the Cancer Center please come in thru the Main Entrance and check in at the main information desk.  You need to re-schedule your appointment should you arrive 10 or more minutes late.  We strive to give you quality time with our providers, and arriving late affects you and other patients whose appointments are after yours.  Also, if you no show three or more times for appointments you may be dismissed from the clinic at the providers discretion.     Again, thank you for choosing [facility].  Our hope is that these requests will decrease the amount of time that you wait before being seen by our physicians.       _____________________________________________________________  Should you have questions after your visit to [facility], please contact our office at [phone] and follow the prompts.  Our office hours are 8:00 a.m. and 4:30 p.m. Monday - Friday.  Please note that voicemails left after 4:00 p.m. may not be returned until the following business day.  We are closed weekends and major holidays.  You do have access to a nurse 24-7, just call the main number to the clinic [phone] and do not press any options, hold on the line and a nurse will answer the phone.    For prescription refill requests, have your pharmacy contact our office and allow 72 hours.    Due to Covid, you will need to wear a mask upon entering  the hospital. If you do not have a mask, a mask will be given to you at the Main Entrance upon arrival. For doctor visits, patients may have 1 support person age 18 or older with them. For treatment visits, patients can not have anyone with them due to social distancing guidelines and our immunocompromised population.      

## 2023-01-01 NOTE — Patient Instructions (Signed)
[facility]  Discharge Instructions: Thank you for choosing [facility] to provide your oncology and hematology care.  If you have a lab appointment with the Cancer Center - please note that after April 8th, 2024, all labs will be drawn in the cancer center.  You do not have to check in or register with the main entrance as you have in the past but will complete your check-in in the cancer center.  Wear comfortable clothing and clothing appropriate for easy access to any Portacath or PICC line.   We strive to give you quality time with your provider. You may need to reschedule your appointment if you arrive late (15 or more minutes).  Arriving late affects you and other patients whose appointments are after yours.  Also, if you miss three or more appointments without notifying the office, you may be dismissed from the clinic at the provider's discretion.      For prescription refill requests, have your pharmacy contact our office and allow 72 hours for refills to be completed.    Today you received the following chemotherapy and/or immunotherapy agents Dara [place]   To help prevent nausea and vomiting after your treatment, we encourage you to take your nausea medication as directed.  Daratumumab; Hyaluronidase Injection What is this medication? DARATUMUMAB; HYALURONIDASE (dar a toom ue mab; hye al ur ON i dase) treats multiple myeloma, a type of bone marrow cancer. Daratumumab works by blocking a protein that causes cancer cells to grow and multiply. This helps to slow or stop the spread of cancer cells. Hyaluronidase works by increasing the absorption of other medications in the body to help them work better. This medication may also be used treat amyloidosis, a condition that causes the buildup of a protein (amyloid) in your body. It works by reducing the buildup of this protein, which decreases symptoms. It is a combination medication that contains a monoclonal  antibody. This medicine may be used for other purposes; ask your health care provider or pharmacist if you have questions. COMMON BRAND NAME(S): DARZALEX FASPRO What should I tell my care team before I take this medication? They need to know if you have any of these conditions: Heart disease Infection, such as chickenpox, cold sores, herpes, hepatitis B Lung or breathing disease An unusual or allergic reaction to daratumumab, hyaluronidase, other medications, foods, dyes, or preservatives Pregnant or trying to get pregnant Breast-feeding How should I use this medication? This medication is injected under the skin. It is given by your care team in a hospital or clinic setting. Talk to your care team about the use of this medication in children. Special care may be needed. Overdosage: If you think you have taken too much of this medicine contact a poison control center or emergency room at once. NOTE: This medicine is only for you. Do not share this medicine with others. What if I miss a dose? Keep appointments for follow-up doses. It is important not to miss your dose. Call your care team if you are unable to keep an appointment. What may interact with this medication? Interactions have not been studied. This list may not describe all possible interactions. Give your health care provider a list of all the medicines, herbs, non-prescription drugs, or dietary supplements you use. Also tell them if you smoke, drink alcohol, or use illegal drugs. Some items may interact with your medicine. What should I watch for while using this medication? Your condition will be monitored   carefully while you are receiving this medication. This medication can cause serious allergic reactions. To reduce your risk, your care team may give you other medication to take before receiving this one. Be sure to follow the directions from your care team. This medication can affect the results of blood tests to match your  blood type. These changes can last for up to 6 months after the final dose. Your care team will do blood tests to match your blood type before you start treatment. Tell all of your care team that you are being treated with this medication before receiving a blood transfusion. This medication can affect the results of some tests used to determine treatment response; extra tests may be needed to evaluate response. Talk to your care team if you wish to become pregnant or think you are pregnant. This medication can cause serious birth defects if taken during pregnancy and for 3 months after the last dose. A reliable form of contraception is recommended while taking this medication and for 3 months after the last dose. Talk to your care team about effective forms of contraception. Do not breast-feed while taking this medication. What side effects may I notice from receiving this medication? Side effects that you should report to your care team as soon as possible: Allergic reactions--skin rash, itching, hives, swelling of the face, lips, tongue, or throat Heart rhythm changes--fast or irregular heartbeat, dizziness, feeling faint or lightheaded, chest pain, trouble breathing Infection--fever, chills, cough, sore throat, wounds that don't heal, pain or trouble when passing urine, general feeling of discomfort or being unwell Infusion reactions--chest pain, shortness of breath or trouble breathing, feeling faint or lightheaded Sudden eye pain or change in vision such as blurry vision, seeing halos around lights, vision loss Unusual bruising or bleeding Side effects that usually do not require medical attention (report to your care team if they continue or are bothersome): Constipation Diarrhea Fatigue Nausea Pain, tingling, or numbness in the hands or feet Swelling of the ankles, hands, or feet This list may not describe all possible side effects. Call your doctor for medical advice about side effects.  You may report side effects to FDA at 1-800-FDA-1088. Where should I keep my medication? This medication is given in a hospital or clinic. It will not be stored at home. NOTE: This sheet is a summary. It may not cover all possible information. If you have questions about this medicine, talk to your doctor, pharmacist, or health care provider.  2024 Elsevier/Gold Standard (2021-11-07 00:00:00)   BELOW ARE SYMPTOMS THAT SHOULD BE REPORTED IMMEDIATELY: *FEVER GREATER THAN 100.4 F (38 C) OR HIGHER *CHILLS OR SWEATING *NAUSEA AND VOMITING THAT IS NOT CONTROLLED WITH YOUR NAUSEA MEDICATION *UNUSUAL SHORTNESS OF BREATH *UNUSUAL BRUISING OR BLEEDING *URINARY PROBLEMS (pain or burning when urinating, or frequent urination) *BOWEL PROBLEMS (unusual diarrhea, constipation, pain near the anus) TENDERNESS IN MOUTH AND THROAT WITH OR WITHOUT PRESENCE OF ULCERS (sore throat, sores in mouth, or a toothache) UNUSUAL RASH, SWELLING OR PAIN  UNUSUAL VAGINAL DISCHARGE OR ITCHING   Items with * indicate a potential emergency and should be followed up as soon as possible or go to the Emergency Department if any problems should occur.  Please show the CHEMOTHERAPY ALERT CARD or IMMUNOTHERAPY ALERT CARD at check-in to the Emergency Department and triage nurse.  Should you have questions after your visit or need to cancel or reschedule your appointment, please contact [facility] [phone]  and follow the prompts.    Office hours are 8:00 a.m. to 4:30 p.m. Monday - Friday. Please note that voicemails left after 4:00 p.m. may not be returned until the following business day.  We are closed weekends and major holidays. You have access to a nurse at all times for urgent questions. Please call the main number to the clinic [phone] and follow the prompts.  For any non-urgent questions, you may also contact your provider using MyChart. We now offer e-Visits for anyone 18 and older to request  care online for non-urgent symptoms. For details visit [email or web address].   Also download the MyChart app! Go to the app store, search "MyChart", open the app, select [facility], and log in with your MyChart username and password.   

## 2023-01-01 NOTE — Progress Notes (Signed)
Patient presents today for Dara [address] infusion and Xgeva injection. Patient is in satisfactory condition with no new complaints voiced.  Vital signs are stable.  Labs reviewed by Dr. Ellin Saba during the office visit and all labs are within treatment parameters.  We will proceed with treatment per MD orders.   Pt's Calcium noted to be 8.5 today, pt denies any tooth or jaw pain and no recent or future dental appointments at this time. Pt reports taking Calcium and Vit D supplements as directed.  Treatment given today per MD orders. Tolerated infusion without adverse affects. Vital signs stable. No complaints at this time. Discharged from clinic ambulatory in stable condition. Alert and oriented x 3. F/U with [facility] as scheduled.

## 2023-01-03 ENCOUNTER — Other Ambulatory Visit: Payer: Self-pay

## 2023-01-08 ENCOUNTER — Other Ambulatory Visit: Payer: Self-pay

## 2023-01-08 MED ORDER — LENALIDOMIDE 10 MG PO CAPS: 10.0000 mg | ORAL_CAPSULE | Freq: Every day | ORAL | 0 refills | Status: DC

## 2023-01-08 NOTE — Telephone Encounter (Signed)
Chart reviewed. Revlimid refilled per last office note with Dr. Katragadda.  

## 2023-01-22 ENCOUNTER — Other Ambulatory Visit: Payer: Self-pay | Admitting: Hematology

## 2023-01-29 ENCOUNTER — Other Ambulatory Visit: Payer: Self-pay

## 2023-01-29 DIAGNOSIS — C9000 Multiple myeloma not having achieved remission: Secondary | ICD-10-CM

## 2023-01-30 ENCOUNTER — Inpatient Hospital Stay: Payer: 59 | Admitting: Hematology

## 2023-01-30 ENCOUNTER — Inpatient Hospital Stay: Payer: 59

## 2023-01-30 ENCOUNTER — Inpatient Hospital Stay: Payer: 59 | Attending: Hematology

## 2023-01-30 VITALS — BP 178/71 | HR 63 | Temp 97.3°F | Resp 18 | Ht 70.5 in | Wt 196.0 lb

## 2023-01-30 DIAGNOSIS — Z79899 Other long term (current) drug therapy: Secondary | ICD-10-CM | POA: Diagnosis not present

## 2023-01-30 DIAGNOSIS — Z5112 Encounter for antineoplastic immunotherapy: Secondary | ICD-10-CM | POA: Diagnosis not present

## 2023-01-30 DIAGNOSIS — C9000 Multiple myeloma not having achieved remission: Secondary | ICD-10-CM

## 2023-01-30 LAB — COMPREHENSIVE METABOLIC PANEL
ALT: 17 U/L (ref 0–44)
AST: 13 U/L — ABNORMAL LOW (ref 15–41)
Albumin: 3.7 g/dL (ref 3.5–5.0)
Alkaline Phosphatase: 64 U/L (ref 38–126)
Anion gap: 7 (ref 5–15)
BUN: 28 mg/dL — ABNORMAL HIGH (ref 8–23)
CO2: 27 mmol/L (ref 22–32)
Calcium: 8.5 mg/dL — ABNORMAL LOW (ref 8.9–10.3)
Chloride: 100 mmol/L (ref 98–111)
Creatinine, Ser: 1.19 mg/dL (ref 0.61–1.24)
GFR, Estimated: >60 mL/min (ref >60)
Glucose, Bld: 122 mg/dL — ABNORMAL HIGH (ref 70–99)
Potassium: 3.8 mmol/L (ref 3.5–5.1)
Sodium: 134 mmol/L — ABNORMAL LOW (ref 135–145)
Total Bilirubin: 1.6 mg/dL — ABNORMAL HIGH (ref 0.3–1.2)
Total Protein: 6.4 g/dL — ABNORMAL LOW (ref 6.5–8.1)

## 2023-01-30 LAB — CBC WITH DIFFERENTIAL/PLATELET
Abs Immature Granulocytes: 0.03 10*3/uL (ref 0.00–0.07)
Basophils Absolute: 0.1 10*3/uL (ref 0.0–0.1)
Basophils Relative: 1 %
Eosinophils Absolute: 0.1 10*3/uL (ref 0.0–0.5)
Eosinophils Relative: 2 %
HCT: 41.9 % (ref 39.0–52.0)
Hemoglobin: 13.9 g/dL (ref 13.0–17.0)
Immature Granulocytes: 0 %
Lymphocytes Relative: 19 %
Lymphs Abs: 1.6 10*3/uL (ref 0.7–4.0)
MCH: 27.7 pg (ref 26.0–34.0)
MCHC: 33.2 g/dL (ref 30.0–36.0)
MCV: 83.5 fL (ref 80.0–100.0)
Monocytes Absolute: 1 10*3/uL (ref 0.1–1.0)
Monocytes Relative: 12 %
Neutro Abs: 5.6 10*3/uL (ref 1.7–7.7)
Neutrophils Relative %: 66 %
Platelets: 168 10*3/uL (ref 150–400)
RBC: 5.02 MIL/uL (ref 4.22–5.81)
RDW: 15.3 % (ref 11.5–15.5)
WBC: 8.3 10*3/uL (ref 4.0–10.5)
nRBC: 0 % (ref 0.0–0.2)

## 2023-01-30 LAB — MAGNESIUM: Magnesium: 2 mg/dL (ref 1.7–2.4)

## 2023-01-30 MED ORDER — DEXAMETHASONE 4 MG PO TABS
40.0000 mg | ORAL_TABLET | Freq: Once | ORAL | Status: AC
  Administered 2023-01-30: 40 mg via ORAL
  Filled 2023-01-30: qty 10

## 2023-01-30 MED ORDER — DARATUMUMAB-HYALURONIDASE-FIHJ 1800-30000 MG-UT/15ML ~~LOC~~ SOLN
1800.0000 mg | Freq: Once | SUBCUTANEOUS | Status: AC
  Administered 2023-01-30: 1800 mg via SUBCUTANEOUS
  Filled 2023-01-30: qty 15

## 2023-01-30 MED ORDER — ACETAMINOPHEN 325 MG PO TABS
650.0000 mg | ORAL_TABLET | Freq: Once | ORAL | Status: AC
  Administered 2023-01-30: 650 mg via ORAL
  Filled 2023-01-30: qty 2

## 2023-01-30 MED ORDER — CETIRIZINE HCL 10 MG PO TABS
10.0000 mg | ORAL_TABLET | Freq: Once | ORAL | Status: AC
  Administered 2023-01-30: 10 mg via ORAL
  Filled 2023-01-30: qty 1

## 2023-01-30 NOTE — Patient Instructions (Signed)
[facility] [place]  Discharge Instructions: Thank you for choosing [facility] to provide your oncology and hematology care.  If you have a lab appointment with the Cancer Center - please note that after April 8th, 2024, all labs will be drawn in the cancer center.  You do not have to check in or register with the main entrance as you have in the past but will complete your check-in in the cancer center.  Wear comfortable clothing and clothing appropriate for easy access to any Portacath or PICC line.   We strive to give you quality time with your provider. You may need to reschedule your appointment if you arrive late (15 or more minutes).  Arriving late affects you and other patients whose appointments are after yours.  Also, if you miss three or more appointments without notifying the office, you may be dismissed from the clinic at the provider's discretion.      For prescription refill requests, have your pharmacy contact our office and allow 72 hours for refills to be completed.    Today you received the following chemotherapy and/or immunotherapy agents Daratumumab.  Daratumumab; Hyaluronidase Injection What is this medication? DARATUMUMAB; HYALURONIDASE (dar a toom ue mab; hye al ur ON i dase) treats multiple myeloma, a type of bone marrow cancer. Daratumumab works by blocking a protein that causes cancer cells to grow and multiply. This helps to slow or stop the spread of cancer cells. Hyaluronidase works by increasing the absorption of other medications in the body to help them work better. This medication may also be used treat amyloidosis, a condition that causes the buildup of a protein (amyloid) in your body. It works by reducing the buildup of this protein, which decreases symptoms. It is a combination medication that contains a monoclonal antibody. This medicine may be used for other purposes; ask your health care provider or pharmacist if you have  questions. COMMON BRAND NAME(S): DARZALEX FASPRO What should I tell my care team before I take this medication? They need to know if you have any of these conditions: Heart disease Infection, such as chickenpox, cold sores, herpes, hepatitis B Lung or breathing disease An unusual or allergic reaction to daratumumab, hyaluronidase, other medications, foods, dyes, or preservatives Pregnant or trying to get pregnant Breast-feeding How should I use this medication? This medication is injected under the skin. It is given by your care team in a hospital or clinic setting. Talk to your care team about the use of this medication in children. Special care may be needed. Overdosage: If you think you have taken too much of this medicine contact a poison control center or emergency room at once. NOTE: This medicine is only for you. Do not share this medicine with others. What if I miss a dose? Keep appointments for follow-up doses. It is important not to miss your dose. Call your care team if you are unable to keep an appointment. What may interact with this medication? Interactions have not been studied. This list may not describe all possible interactions. Give your health care provider a list of all the medicines, herbs, non-prescription drugs, or dietary supplements you use. Also tell them if you smoke, drink alcohol, or use illegal drugs. Some items may interact with your medicine. What should I watch for while using this medication? Your condition will be monitored carefully while you are receiving this medication. This medication can cause serious allergic reactions. To reduce your risk, your care team may   give you other medication to take before receiving this one. Be sure to follow the directions from your care team. This medication can affect the results of blood tests to match your blood type. These changes can last for up to 6 months after the final dose. Your care team will do blood tests to  match your blood type before you start treatment. Tell all of your care team that you are being treated with this medication before receiving a blood transfusion. This medication can affect the results of some tests used to determine treatment response; extra tests may be needed to evaluate response. Talk to your care team if you wish to become pregnant or think you are pregnant. This medication can cause serious birth defects if taken during pregnancy and for 3 months after the last dose. A reliable form of contraception is recommended while taking this medication and for 3 months after the last dose. Talk to your care team about effective forms of contraception. Do not breast-feed while taking this medication. What side effects may I notice from receiving this medication? Side effects that you should report to your care team as soon as possible: Allergic reactions--skin rash, itching, hives, swelling of the face, lips, tongue, or throat Heart rhythm changes--fast or irregular heartbeat, dizziness, feeling faint or lightheaded, chest pain, trouble breathing Infection--fever, chills, cough, sore throat, wounds that don't heal, pain or trouble when passing urine, general feeling of discomfort or being unwell Infusion reactions--chest pain, shortness of breath or trouble breathing, feeling faint or lightheaded Sudden eye pain or change in vision such as blurry vision, seeing halos around lights, vision loss Unusual bruising or bleeding Side effects that usually do not require medical attention (report to your care team if they continue or are bothersome): Constipation Diarrhea Fatigue Nausea Pain, tingling, or numbness in the hands or feet Swelling of the ankles, hands, or feet This list may not describe all possible side effects. Call your doctor for medical advice about side effects. You may report side effects to FDA at 1-800-FDA-1088. Where should I keep my medication? This medication is given  in a hospital or clinic. It will not be stored at home. NOTE: This sheet is a summary. It may not cover all possible information. If you have questions about this medicine, talk to your doctor, pharmacist, or health care provider.  2024 Elsevier/Gold Standard (2021-11-07 00:00:00)        To help prevent nausea and vomiting after your treatment, we encourage you to take your nausea medication as directed.  BELOW ARE SYMPTOMS THAT SHOULD BE REPORTED IMMEDIATELY: *FEVER GREATER THAN 100.4 F (38 C) OR HIGHER *CHILLS OR SWEATING *NAUSEA AND VOMITING THAT IS NOT CONTROLLED WITH YOUR NAUSEA MEDICATION *UNUSUAL SHORTNESS OF BREATH *UNUSUAL BRUISING OR BLEEDING *URINARY PROBLEMS (pain or burning when urinating, or frequent urination) *BOWEL PROBLEMS (unusual diarrhea, constipation, pain near the anus) TENDERNESS IN MOUTH AND THROAT WITH OR WITHOUT PRESENCE OF ULCERS (sore throat, sores in mouth, or a toothache) UNUSUAL RASH, SWELLING OR PAIN  UNUSUAL VAGINAL DISCHARGE OR ITCHING   Items with * indicate a potential emergency and should be followed up as soon as possible or go to the Emergency Department if any problems should occur.  Please show the CHEMOTHERAPY ALERT CARD or IMMUNOTHERAPY ALERT CARD at check-in to the Emergency Department and triage nurse.  Should you have questions after your visit or need to cancel or reschedule your appointment, please contact [facility] [phone]  and follow   the prompts.  Office hours are 8:00 a.m. to 4:30 p.m. Monday - Friday. Please note that voicemails left after 4:00 p.m. may not be returned until the following business day.  We are closed weekends and major holidays. You have access to a nurse at all times for urgent questions. Please call the main number to the clinic [phone] and follow the prompts.  For any non-urgent questions, you may also contact your provider using MyChart. We now offer e-Visits for anyone 18 and older  to request care online for non-urgent symptoms. For details visit [email or web address].   Also download the MyChart app! Go to the app store, search "MyChart", open the app, select , and log in with your MyChart username and password.   

## 2023-01-30 NOTE — Progress Notes (Signed)
Patient presents today for Daratumumab injection.  Patient is in satisfactory condition with no new complaints voiced.  Vital signs are stable.  Labs reviewed.  Bilirubin today is 1.6.  MD aware.  We will proceed with treatment per MD orders.    Patient tolerated treatment well with no complaints voiced.  Patient left ambulatory in stable condition.  Vital signs stable at discharge.  Follow up as scheduled.

## 2023-02-05 ENCOUNTER — Other Ambulatory Visit: Payer: Self-pay

## 2023-02-05 MED ORDER — LENALIDOMIDE 10 MG PO CAPS: 10.0000 mg | ORAL_CAPSULE | Freq: Every day | ORAL | 0 refills | Status: DC

## 2023-02-05 NOTE — Telephone Encounter (Signed)
Chart reviewed. Revlimid refilled per last office note with Dr. Katragadda.  

## 2023-02-27 ENCOUNTER — Inpatient Hospital Stay: Payer: 59 | Attending: Hematology

## 2023-02-27 ENCOUNTER — Other Ambulatory Visit: Payer: Self-pay | Admitting: Hematology

## 2023-02-27 ENCOUNTER — Inpatient Hospital Stay: Payer: 59

## 2023-02-27 VITALS — BP 171/64 | HR 66 | Temp 97.0°F | Resp 18 | Wt 192.0 lb

## 2023-02-27 DIAGNOSIS — Z5112 Encounter for antineoplastic immunotherapy: Secondary | ICD-10-CM | POA: Insufficient documentation

## 2023-02-27 DIAGNOSIS — C9000 Multiple myeloma not having achieved remission: Secondary | ICD-10-CM

## 2023-02-27 DIAGNOSIS — Z79899 Other long term (current) drug therapy: Secondary | ICD-10-CM | POA: Diagnosis not present

## 2023-02-27 LAB — CBC WITH DIFFERENTIAL/PLATELET
Abs Immature Granulocytes: 0.04 10*3/uL (ref 0.00–0.07)
Basophils Absolute: 0.1 10*3/uL (ref 0.0–0.1)
Basophils Relative: 1 %
Eosinophils Absolute: 0.1 10*3/uL (ref 0.0–0.5)
Eosinophils Relative: 1 %
HCT: 39.5 % (ref 39.0–52.0)
Hemoglobin: 12.8 g/dL — ABNORMAL LOW (ref 13.0–17.0)
Immature Granulocytes: 1 %
Lymphocytes Relative: 16 %
Lymphs Abs: 1.3 10*3/uL (ref 0.7–4.0)
MCH: 26.7 pg (ref 26.0–34.0)
MCHC: 32.4 g/dL (ref 30.0–36.0)
MCV: 82.3 fL (ref 80.0–100.0)
Monocytes Absolute: 0.9 10*3/uL (ref 0.1–1.0)
Monocytes Relative: 11 %
Neutro Abs: 5.9 10*3/uL (ref 1.7–7.7)
Neutrophils Relative %: 70 %
Platelets: 205 10*3/uL (ref 150–400)
RBC: 4.8 MIL/uL (ref 4.22–5.81)
RDW: 15.1 % (ref 11.5–15.5)
WBC: 8.3 10*3/uL (ref 4.0–10.5)
nRBC: 0 % (ref 0.0–0.2)

## 2023-02-27 LAB — COMPREHENSIVE METABOLIC PANEL
ALT: 17 U/L (ref 0–44)
AST: 14 U/L — ABNORMAL LOW (ref 15–41)
Albumin: 3.5 g/dL (ref 3.5–5.0)
Alkaline Phosphatase: 62 U/L (ref 38–126)
Anion gap: 11 (ref 5–15)
BUN: 25 mg/dL — ABNORMAL HIGH (ref 8–23)
CO2: 25 mmol/L (ref 22–32)
Calcium: 8.3 mg/dL — ABNORMAL LOW (ref 8.9–10.3)
Chloride: 98 mmol/L (ref 98–111)
Creatinine, Ser: 1.17 mg/dL (ref 0.61–1.24)
GFR, Estimated: >60 mL/min (ref >60)
Glucose, Bld: 122 mg/dL — ABNORMAL HIGH (ref 70–99)
Potassium: 3.7 mmol/L (ref 3.5–5.1)
Sodium: 134 mmol/L — ABNORMAL LOW (ref 135–145)
Total Bilirubin: 1.4 mg/dL — ABNORMAL HIGH (ref 0.3–1.2)
Total Protein: 6.3 g/dL — ABNORMAL LOW (ref 6.5–8.1)

## 2023-02-27 MED ORDER — DARATUMUMAB-HYALURONIDASE-FIHJ 1800-30000 MG-UT/15ML ~~LOC~~ SOLN
1800.0000 mg | Freq: Once | SUBCUTANEOUS | Status: AC
  Administered 2023-02-27: 1800 mg via SUBCUTANEOUS
  Filled 2023-02-27: qty 15

## 2023-02-27 MED ORDER — CETIRIZINE HCL 10 MG PO TABS
10.0000 mg | ORAL_TABLET | Freq: Once | ORAL | Status: AC
  Administered 2023-02-27: 10 mg via ORAL
  Filled 2023-02-27: qty 1

## 2023-02-27 MED ORDER — DEXAMETHASONE 4 MG PO TABS
40.0000 mg | ORAL_TABLET | Freq: Once | ORAL | Status: AC
  Administered 2023-02-27: 40 mg via ORAL
  Filled 2023-02-27: qty 10

## 2023-02-27 MED ORDER — ACETAMINOPHEN 325 MG PO TABS
650.0000 mg | ORAL_TABLET | Freq: Once | ORAL | Status: AC
  Administered 2023-02-27: 650 mg via ORAL
  Filled 2023-02-27: qty 2

## 2023-02-27 NOTE — Patient Instructions (Signed)
[facility]  Discharge Instructions: Thank you for choosing [facility] to provide your oncology and hematology care.  If you have a lab appointment with the Cancer Center - please note that after April 8th, 2024, all labs will be drawn in the cancer center.  You do not have to check in or register with the main entrance as you have in the past but will complete your check-in in the cancer center.  Wear comfortable clothing and clothing appropriate for easy access to any Portacath or PICC line.   We strive to give you quality time with your provider. You may need to reschedule your appointment if you arrive late (15 or more minutes).  Arriving late affects you and other patients whose appointments are after yours.  Also, if you miss three or more appointments without notifying the office, you may be dismissed from the clinic at the provider's discretion.      For prescription refill requests, have your pharmacy contact our office and allow 72 hours for refills to be completed.    Today you received the following chemotherapy darzalex SQ injection   To help prevent nausea and vomiting after your treatment, we encourage you to take your nausea medication as directed.  BELOW ARE SYMPTOMS THAT SHOULD BE REPORTED IMMEDIATELY: *FEVER GREATER THAN 100.4 F (38 C) OR HIGHER *CHILLS OR SWEATING *NAUSEA AND VOMITING THAT IS NOT CONTROLLED WITH YOUR NAUSEA MEDICATION *UNUSUAL SHORTNESS OF BREATH *UNUSUAL BRUISING OR BLEEDING *URINARY PROBLEMS (pain or burning when urinating, or frequent urination) *BOWEL PROBLEMS (unusual diarrhea, constipation, pain near the anus) TENDERNESS IN MOUTH AND THROAT WITH OR WITHOUT PRESENCE OF ULCERS (sore throat, sores in mouth, or a toothache) UNUSUAL RASH, SWELLING OR PAIN  UNUSUAL VAGINAL DISCHARGE OR ITCHING   Items with * indicate a potential emergency and should be followed up as soon as possible or go to the Emergency Department  if any problems should occur.  Please show the CHEMOTHERAPY ALERT CARD or IMMUNOTHERAPY ALERT CARD at check-in to the Emergency Department and triage nurse.  Should you have questions after your visit or need to cancel or reschedule your appointment, please contact [facility] [phone]  and follow the prompts.  Office hours are 8:00 a.m. to 4:30 p.m. Monday - Friday. Please note that voicemails left after 4:00 p.m. may not be returned until the following business day.  We are closed weekends and major holidays. You have access to a nurse at all times for urgent questions. Please call the main number to the clinic [phone] and follow the prompts.  For any non-urgent questions, you may also contact your provider using MyChart. We now offer e-Visits for anyone 70 and older to request care online for non-urgent symptoms. For details visit [email or web address].   Also download the MyChart app! Go to the app store, search "MyChart", open the app, select [facility], and log in with your MyChart username and password.

## 2023-02-27 NOTE — Progress Notes (Signed)
Treatment given per orders. Patient tolerated it well without problems. Vitals stable and discharged home from clinic ambulatory. Follow up as scheduled.  

## 2023-02-28 LAB — KAPPA/LAMBDA LIGHT CHAINS
Kappa free light chain: 12.2 mg/L (ref 3.3–19.4)
Kappa, lambda light chain ratio: 1.44 (ref 0.26–1.65)
Lambda free light chains: 8.5 mg/L (ref 5.7–26.3)

## 2023-03-01 LAB — PROTEIN ELECTROPHORESIS, SERUM
A/G Ratio: 1.4 (ref 0.7–1.7)
Albumin ELP: 3.3 g/dL (ref 2.9–4.4)
Alpha-1-Globulin: 0.3 g/dL (ref 0.0–0.4)
Alpha-2-Globulin: 0.8 g/dL (ref 0.4–1.0)
Beta Globulin: 0.8 g/dL (ref 0.7–1.3)
Gamma Globulin: 0.2 g/dL — ABNORMAL LOW (ref 0.4–1.8)
Globulin, Total: 2.3 g/dL (ref 2.2–3.9)
Total Protein ELP: 5.6 g/dL — ABNORMAL LOW (ref 6.0–8.5)

## 2023-03-06 ENCOUNTER — Other Ambulatory Visit: Payer: Self-pay

## 2023-03-06 LAB — IMMUNOFIXATION ELECTROPHORESIS
IgA: 41 mg/dL — ABNORMAL LOW (ref 61–437)
IgG (Immunoglobin G), Serum: 305 mg/dL — ABNORMAL LOW (ref 603–1613)
IgM (Immunoglobulin M), Srm: 9 mg/dL — ABNORMAL LOW (ref 20–172)
Total Protein ELP: 5.9 g/dL — ABNORMAL LOW (ref 6.0–8.5)

## 2023-03-06 MED ORDER — LENALIDOMIDE 10 MG PO CAPS: 10.0000 mg | ORAL_CAPSULE | Freq: Every day | ORAL | 0 refills | Status: DC

## 2023-03-06 NOTE — Telephone Encounter (Signed)
Chart reviewed. Revlimid refilled per last office note with Dr. Katragadda.  

## 2023-03-11 ENCOUNTER — Telehealth: Payer: Self-pay | Admitting: *Deleted

## 2023-03-11 NOTE — Telephone Encounter (Signed)
Received a MCM from sister-in-law Tammy stating that patient has cough and does not know if he has a fever.  I requested a telephone number where he could be reached and she declined to provide, stating that he would not answer.  I advised that if he does have a fever he should go to the ER to be evaluated since he is on active treatment.  She states that he probably won't do that and will do a home test for COVID and call us with the results.  Stressed to her that he needs to be evaluated in the event he has another active infectious process going on.  Verbalized understanding.

## 2023-03-14 ENCOUNTER — Encounter (HOSPITAL_COMMUNITY): Payer: Self-pay | Admitting: Hematology

## 2023-03-14 ENCOUNTER — Encounter: Payer: Self-pay | Admitting: Hematology

## 2023-03-25 ENCOUNTER — Other Ambulatory Visit: Payer: Self-pay

## 2023-03-26 ENCOUNTER — Other Ambulatory Visit: Payer: Self-pay

## 2023-03-26 DIAGNOSIS — C9000 Multiple myeloma not having achieved remission: Secondary | ICD-10-CM

## 2023-03-27 ENCOUNTER — Inpatient Hospital Stay: Payer: 59 | Attending: Hematology | Admitting: Hematology

## 2023-03-27 ENCOUNTER — Inpatient Hospital Stay: Payer: 59

## 2023-03-27 VITALS — BP 191/80 | HR 77 | Temp 98.4°F | Resp 18 | Wt 190.3 lb

## 2023-03-27 DIAGNOSIS — C9000 Multiple myeloma not having achieved remission: Secondary | ICD-10-CM | POA: Insufficient documentation

## 2023-03-27 DIAGNOSIS — Z5112 Encounter for antineoplastic immunotherapy: Secondary | ICD-10-CM | POA: Insufficient documentation

## 2023-03-27 DIAGNOSIS — Z79899 Other long term (current) drug therapy: Secondary | ICD-10-CM | POA: Diagnosis not present

## 2023-03-27 LAB — COMPREHENSIVE METABOLIC PANEL
ALT: 16 U/L (ref 0–44)
AST: 15 U/L (ref 15–41)
Albumin: 3.7 g/dL (ref 3.5–5.0)
Alkaline Phosphatase: 66 U/L (ref 38–126)
Anion gap: 10 (ref 5–15)
BUN: 23 mg/dL (ref 8–23)
CO2: 28 mmol/L (ref 22–32)
Calcium: 8.3 mg/dL — ABNORMAL LOW (ref 8.9–10.3)
Chloride: 97 mmol/L — ABNORMAL LOW (ref 98–111)
Creatinine, Ser: 1.12 mg/dL (ref 0.61–1.24)
GFR, Estimated: >60 mL/min (ref >60)
Glucose, Bld: 151 mg/dL — ABNORMAL HIGH (ref 70–99)
Potassium: 3.7 mmol/L (ref 3.5–5.1)
Sodium: 135 mmol/L (ref 135–145)
Total Bilirubin: 1.7 mg/dL — ABNORMAL HIGH (ref 0.3–1.2)
Total Protein: 6.4 g/dL — ABNORMAL LOW (ref 6.5–8.1)

## 2023-03-27 LAB — CBC WITH DIFFERENTIAL/PLATELET
Abs Immature Granulocytes: 0.05 10*3/uL (ref 0.00–0.07)
Basophils Absolute: 0.1 10*3/uL (ref 0.0–0.1)
Basophils Relative: 1 %
Eosinophils Absolute: 0.1 10*3/uL (ref 0.0–0.5)
Eosinophils Relative: 1 %
HCT: 43.7 % (ref 39.0–52.0)
Hemoglobin: 13.9 g/dL (ref 13.0–17.0)
Immature Granulocytes: 1 %
Lymphocytes Relative: 15 %
Lymphs Abs: 1.2 10*3/uL (ref 0.7–4.0)
MCH: 26.1 pg (ref 26.0–34.0)
MCHC: 31.8 g/dL (ref 30.0–36.0)
MCV: 82 fL (ref 80.0–100.0)
Monocytes Absolute: 0.8 10*3/uL (ref 0.1–1.0)
Monocytes Relative: 9 %
Neutro Abs: 5.9 10*3/uL (ref 1.7–7.7)
Neutrophils Relative %: 73 %
Platelets: 231 10*3/uL (ref 150–400)
RBC: 5.33 MIL/uL (ref 4.22–5.81)
RDW: 16.1 % — ABNORMAL HIGH (ref 11.5–15.5)
WBC: 8 10*3/uL (ref 4.0–10.5)
nRBC: 0 % (ref 0.0–0.2)

## 2023-03-27 LAB — MAGNESIUM: Magnesium: 2 mg/dL (ref 1.7–2.4)

## 2023-03-27 MED ORDER — CETIRIZINE HCL 10 MG PO TABS
10.0000 mg | ORAL_TABLET | Freq: Once | ORAL | Status: AC
  Administered 2023-03-27: 10 mg via ORAL
  Filled 2023-03-27: qty 1

## 2023-03-27 MED ORDER — DARATUMUMAB-HYALURONIDASE-FIHJ 1800-30000 MG-UT/15ML ~~LOC~~ SOLN
1800.0000 mg | Freq: Once | SUBCUTANEOUS | Status: AC
  Administered 2023-03-27: 1800 mg via SUBCUTANEOUS
  Filled 2023-03-27: qty 15

## 2023-03-27 MED ORDER — DEXAMETHASONE 4 MG PO TABS
40.0000 mg | ORAL_TABLET | Freq: Once | ORAL | Status: AC
  Administered 2023-03-27: 40 mg via ORAL
  Filled 2023-03-27: qty 10

## 2023-03-27 MED ORDER — DENOSUMAB 120 MG/1.7ML ~~LOC~~ SOLN
120.0000 mg | Freq: Once | SUBCUTANEOUS | Status: AC
  Administered 2023-03-27: 120 mg via SUBCUTANEOUS
  Filled 2023-03-27: qty 1.7

## 2023-03-27 MED ORDER — ACETAMINOPHEN 325 MG PO TABS
650.0000 mg | ORAL_TABLET | Freq: Once | ORAL | Status: AC
  Administered 2023-03-27: 650 mg via ORAL
  Filled 2023-03-27: qty 2

## 2023-03-27 NOTE — Patient Instructions (Signed)
[place] at [facility] Discharge Instructions   You were seen and examined today by Dr. Delton Coombes.  He reviewed the results of your lab work which are normal/stable.   We will proceed with your treatment today.   Continue Revlimid as prescribed.   Return as scheduled.    Thank you for choosing [place] at [facility] to provide your oncology and hematology care.  To afford each patient quality time with our provider, please arrive at least 15 minutes before your scheduled appointment time.   If you have a lab appointment with the [place] please come in thru the Main Entrance and check in at the main information desk.  You need to re-schedule your appointment should you arrive 10 or more minutes late.  We strive to give you quality time with our providers, and arriving late affects you and other patients whose appointments are after yours.  Also, if you no show three or more times for appointments you may be dismissed from the clinic at the providers discretion.     Again, thank you for choosing [facility].  Our hope is that these requests will decrease the amount of time that you wait before being seen by our physicians.       _____________________________________________________________  Should you have questions after your visit to [facility], please contact our office at [phone] and follow the prompts.  Our office hours are 8:00 a.m. and 4:30 p.m. Monday - Friday.  Please note that voicemails left after 4:00 p.m. may not be returned until the following business day.  We are closed weekends and major holidays.  You do have access to a nurse 24-7, just call the main number to the clinic [phone] and do not press any options, hold on the line and a nurse will answer the phone.    For prescription refill requests, have your pharmacy contact our office and allow 72 hours.    Due to Covid, you  will need to wear a mask upon entering the hospital. If you do not have a mask, a mask will be given to you at the Main Entrance upon arrival. For doctor visits, patients may have 1 support person age 60 or older with them. For treatment visits, patients can not have anyone with them due to social distancing guidelines and our immunocompromised population.

## 2023-03-27 NOTE — Progress Notes (Signed)
Patient presents today for Daratumumab and Xgeva injections. Patient is in satisfactory condition with no new complaints voiced.  BP elevated.  MD aware.  All other vital signs are stable.  Labs reviewed by Dr. Ellin Saba during the office visit.  Bilirubin today is 1.7.  All other labs are within treatment parameters.  We will proceed with treatment per MD orders.  Patient tolerated injections well with no complaints voiced.  Patient left ambulatory in stable condition.  Vital signs stable at discharge.  Follow up as scheduled.

## 2023-03-27 NOTE — Progress Notes (Signed)
Patient is taking Revlimid as prescribed. He has not missed any doses and reports no side effects at this time.    Patient has been examined by Dr. Ellin Saba. Vital signs (BP 187/77) and labs have been reviewed by MD - ANC, Creatinine, LFTs (bilirubin 1.7), hemoglobin, and platelets are within treatment parameters per M.D. - pt may proceed with treatment.  Primary RN and pharmacy notified.

## 2023-03-27 NOTE — Patient Instructions (Signed)
[facility]  Discharge Instructions: Thank you for choosing [facility] to provide your oncology and hematology care.  If you have a lab appointment with the Cancer Center - please note that after April 8th, 2024, all labs will be drawn in the cancer center.  You do not have to check in or register with the main entrance as you have in the past but will complete your check-in in the cancer center.  Wear comfortable clothing and clothing appropriate for easy access to any Portacath or PICC line.   We strive to give you quality time with your provider. You may need to reschedule your appointment if you arrive late (15 or more minutes).  Arriving late affects you and other patients whose appointments are after yours.  Also, if you miss three or more appointments without notifying the office, you may be dismissed from the clinic at the provider's discretion.      For prescription refill requests, have your pharmacy contact our office and allow 72 hours for refills to be completed.    Today you received the following chemotherapy and/or immunotherapy agents Daratumumab/Xgeva.   Daratumumab; Hyaluronidase Injection What is this medication? DARATUMUMAB; HYALURONIDASE (dar a toom ue mab; hye al ur ON i dase) treats multiple myeloma, a type of bone marrow cancer. Daratumumab works by blocking a protein that causes cancer cells to grow and multiply. This helps to slow or stop the spread of cancer cells. Hyaluronidase works by increasing the absorption of other medications in the body to help them work better. This medication may also be used treat amyloidosis, a condition that causes the buildup of a protein (amyloid) in your body. It works by reducing the buildup of this protein, which decreases symptoms. It is a combination medication that contains a monoclonal antibody. This medicine may be used for other purposes; ask your health care provider or pharmacist if you have  questions. COMMON BRAND NAME(S): DARZALEX FASPRO What should I tell my care team before I take this medication? They need to know if you have any of these conditions: Heart disease Infection, such as chickenpox, cold sores, herpes, hepatitis B Lung or breathing disease An unusual or allergic reaction to daratumumab, hyaluronidase, other medications, foods, dyes, or preservatives Pregnant or trying to get pregnant Breast-feeding How should I use this medication? This medication is injected under the skin. It is given by your care team in a hospital or clinic setting. Talk to your care team about the use of this medication in children. Special care may be needed. Overdosage: If you think you have taken too much of this medicine contact a poison control center or emergency room at once. NOTE: This medicine is only for you. Do not share this medicine with others. What if I miss a dose? Keep appointments for follow-up doses. It is important not to miss your dose. Call your care team if you are unable to keep an appointment. What may interact with this medication? Interactions have not been studied. This list may not describe all possible interactions. Give your health care provider a list of all the medicines, herbs, non-prescription drugs, or dietary supplements you use. Also tell them if you smoke, drink alcohol, or use illegal drugs. Some items may interact with your medicine. What should I watch for while using this medication? Your condition will be monitored carefully while you are receiving this medication. This medication can cause serious allergic reactions. To reduce your risk, your care team  may give you other medication to take before receiving this one. Be sure to follow the directions from your care team. This medication can affect the results of blood tests to match your blood type. These changes can last for up to 6 months after the final dose. Your care team will do blood tests to  match your blood type before you start treatment. Tell all of your care team that you are being treated with this medication before receiving a blood transfusion. This medication can affect the results of some tests used to determine treatment response; extra tests may be needed to evaluate response. Talk to your care team if you wish to become pregnant or think you are pregnant. This medication can cause serious birth defects if taken during pregnancy and for 3 months after the last dose. A reliable form of contraception is recommended while taking this medication and for 3 months after the last dose. Talk to your care team about effective forms of contraception. Do not breast-feed while taking this medication. What side effects may I notice from receiving this medication? Side effects that you should report to your care team as soon as possible: Allergic reactions--skin rash, itching, hives, swelling of the face, lips, tongue, or throat Heart rhythm changes--fast or irregular heartbeat, dizziness, feeling faint or lightheaded, chest pain, trouble breathing Infection--fever, chills, cough, sore throat, wounds that don't heal, pain or trouble when passing urine, general feeling of discomfort or being unwell Infusion reactions--chest pain, shortness of breath or trouble breathing, feeling faint or lightheaded Sudden eye pain or change in vision such as blurry vision, seeing halos around lights, vision loss Unusual bruising or bleeding Side effects that usually do not require medical attention (report to your care team if they continue or are bothersome): Constipation Diarrhea Fatigue Nausea Pain, tingling, or numbness in the hands or feet Swelling of the ankles, hands, or feet This list may not describe all possible side effects. Call your doctor for medical advice about side effects. You may report side effects to FDA at 1-800-FDA-1088. Where should I keep my medication? This medication is given  in a hospital or clinic. It will not be stored at home. NOTE: This sheet is a summary. It may not cover all possible information. If you have questions about this medicine, talk to your doctor, pharmacist, or health care provider.  2024 Elsevier/Gold Standard (2021-11-07 00:00:00)    Denosumab Injection (Oncology) What is this medication? DENOSUMAB (den oh SUE mab) prevents weakened bones caused by cancer. It may also be used to treat noncancerous bone tumors that cannot be removed by surgery. It can also be used to treat high calcium levels in the blood caused by cancer. It works by blocking a protein that causes bones to break down quickly. This slows down the release of calcium from bones, which lowers calcium levels in your blood. It also makes your bones stronger and less likely to break (fracture). This medicine may be used for other purposes; ask your health care provider or pharmacist if you have questions. COMMON BRAND NAME(S): XGEVA What should I tell my care team before I take this medication? They need to know if you have any of these conditions: Dental disease Having surgery or tooth extraction Infection Kidney disease Low levels of calcium or vitamin D in the blood Malnutrition On hemodialysis Skin conditions or sensitivity Thyroid or parathyroid disease An unusual reaction to denosumab, other medications, foods, dyes, or preservatives Pregnant or trying to get pregnant Breast-feeding How  should I use this medication? This medication is for injection under the skin. It is given by your care team in a hospital or clinic setting. A special MedGuide will be given to you before each treatment. Be sure to read this information carefully each time. Talk to your care team about the use of this medication in children. While it may be prescribed for children as young as 13 years for selected conditions, precautions do apply. Overdosage: If you think you have taken too much of this  medicine contact a poison control center or emergency room at once. NOTE: This medicine is only for you. Do not share this medicine with others. What if I miss a dose? Keep appointments for follow-up doses. It is important not to miss your dose. Call your care team if you are unable to keep an appointment. What may interact with this medication? Do not take this medication with any of the following: Other medications containing denosumab This medication may also interact with the following: Medications that lower your chance of fighting infection Steroid medications, such as prednisone or cortisone This list may not describe all possible interactions. Give your health care provider a list of all the medicines, herbs, non-prescription drugs, or dietary supplements you use. Also tell them if you smoke, drink alcohol, or use illegal drugs. Some items may interact with your medicine. What should I watch for while using this medication? Your condition will be monitored carefully while you are receiving this medication. You may need blood work while taking this medication. This medication may increase your risk of getting an infection. Call your care team for advice if you get a fever, chills, sore throat, or other symptoms of a cold or flu. Do not treat yourself. Try to avoid being around people who are sick. You should make sure you get enough calcium and vitamin D while you are taking this medication, unless your care team tells you not to. Discuss the foods you eat and the vitamins you take with your care team. Some people who take this medication have severe bone, joint, or muscle pain. This medication may also increase your risk for jaw problems or a broken thigh bone. Tell your care team right away if you have severe pain in your jaw, bones, joints, or muscles. Tell your care team if you have any pain that does not go away or that gets worse. Talk to your care team if you may be pregnant. Serious  birth defects can occur if you take this medication during pregnancy and for 5 months after the last dose. You will need a negative pregnancy test before starting this medication. Contraception is recommended while taking this medication and for 5 months after the last dose. Your care team can help you find the option that works for you. What side effects may I notice from receiving this medication? Side effects that you should report to your care team as soon as possible: Allergic reactions--skin rash, itching, hives, swelling of the face, lips, tongue, or throat Bone, joint, or muscle pain Low calcium level--muscle pain or cramps, confusion, tingling, or numbness in the hands or feet Osteonecrosis of the jaw--pain, swelling, or redness in the mouth, numbness of the jaw, poor healing after dental work, unusual discharge from the mouth, visible bones in the mouth Side effects that usually do not require medical attention (report to your care team if they continue or are bothersome): Cough Diarrhea Fatigue Headache Nausea This list may not describe all possible  side effects. Call your doctor for medical advice about side effects. You may report side effects to FDA at 1-800-FDA-1088. Where should I keep my medication? This medication is given in a hospital or clinic. It will not be stored at home. NOTE: This sheet is a summary. It may not cover all possible information. If you have questions about this medicine, talk to your doctor, pharmacist, or health care provider.  2024 Elsevier/Gold Standard (2021-11-22 00:00:00)        To help prevent nausea and vomiting after your treatment, we encourage you to take your nausea medication as directed.  BELOW ARE SYMPTOMS THAT SHOULD BE REPORTED IMMEDIATELY: *FEVER GREATER THAN 100.4 F (38 C) OR HIGHER *CHILLS OR SWEATING *NAUSEA AND VOMITING THAT IS NOT CONTROLLED WITH YOUR NAUSEA MEDICATION *UNUSUAL SHORTNESS OF BREATH *UNUSUAL BRUISING OR  BLEEDING *URINARY PROBLEMS (pain or burning when urinating, or frequent urination) *BOWEL PROBLEMS (unusual diarrhea, constipation, pain near the anus) TENDERNESS IN MOUTH AND THROAT WITH OR WITHOUT PRESENCE OF ULCERS (sore throat, sores in mouth, or a toothache) UNUSUAL RASH, SWELLING OR PAIN  UNUSUAL VAGINAL DISCHARGE OR ITCHING   Items with * indicate a potential emergency and should be followed up as soon as possible or go to the Emergency Department if any problems should occur.  Please show the CHEMOTHERAPY ALERT CARD or IMMUNOTHERAPY ALERT CARD at check-in to the Emergency Department and triage nurse.  Should you have questions after your visit or need to cancel or reschedule your appointment, please contact [facility] [phone]  and follow the prompts.  Office hours are 8:00 a.m. to 4:30 p.m. Monday - Friday. Please note that voicemails left after 4:00 p.m. may not be returned until the following business day.  We are closed weekends and major holidays. You have access to a nurse at all times for urgent questions. Please call the main number to the clinic [phone] and follow the prompts.  For any non-urgent questions, you may also contact your provider using MyChart. We now offer e-Visits for anyone 62 and older to request care online for non-urgent symptoms. For details visit [email or web address].   Also download the MyChart app! Go to the app store, search "MyChart", open the app, select [facility], and log in with your MyChart username and password.

## 2023-03-27 NOTE — Progress Notes (Signed)
Ok to proceed with elevated BP.  T.O. Dr Carilyn Goodpasture, PharmD

## 2023-03-27 NOTE — Progress Notes (Signed)
[facility] [address], [place] [address]    Clinic Day:  03/27/2023  Referring physician: Doreatha Massed, MD  Patient Care Team: Doreatha Massed, MD as PCP - General (Hematology) Doreatha Massed, MD as Medical Oncologist (Oncology)   ASSESSMENT & PLAN:   Assessment: 1.  IgG kappa light chain multiple myeloma: - Admission with hypercalcemia and renal failure. - CT CAP showed extensive lytic foci throughout the axial and appendicular skeleton of the chest, abdomen and pelvis.  Splenomegaly with an enlarged lobular spleen, nonspecific. - Bone marrow biopsy on 11/01/2020 shows 85% atypical plasma cells in the aspirate. - FISH panel positive for 1 p-,-13,14q-,16q-,17p-/-17 and 20q- - Cytogenetics-no metaphases available for analysis. - SPEP with 5.4 g of M spike.  Free light chain ratio 1030.  LDH normal.  Beta-2 microglobulin 18.7.  24-hour urine total protein 3.6 g. - 6 cycles of Dara VRD from 11/21/2020 through 03/07/2021. - Patient declined bone marrow transplant. - Maintenance Revlimid and monthly daratumumab started on 03/27/2021.   2.  Social/family history: - He worked as a Naval architect.  He lives by himself. - Mother with breast and ovarian cancer.  Father had metastatic kidney cancer.  His brother's daughter has CML.    Plan: 1.  IgG kappa light chain multiple myeloma, high risk: - Tolerating Revlimid and Darzalex reasonably well. - Reported respiratory infection 2 weeks ago which resolved. - Reviewed myeloma labs from 02/27/2023: Normal LFTs.  SPEP shows no M spike.  FLC ratio is 1.44.  Immunofixation was normal. - Labs today shows normal CBC.  Creatinine was normal. - Continue Revlimid 10 mg 3 weeks on/1 week off.  Continue monthly Darzalex with dexamethasone on days of Darzalex. - RTC 3 months for follow-up with repeat myeloma labs.   2.  Myeloma bone disease: - Calcium is 8.3.  Continue calcium and D supplements.  Will resume  denosumab today and monthly.  3.  ID prophylaxis: - Continue acyclovir twice daily and aspirin 81 mg daily.    Orders Placed This Encounter  Procedures   CBC with Differential    Standing Status:   Future    Standing Expiration Date:   04/24/2024   Comprehensive metabolic panel    Standing Status:   Future    Standing Expiration Date:   04/24/2024   CBC with Differential    Standing Status:   Future    Standing Expiration Date:   05/22/2024   Comprehensive metabolic panel    Standing Status:   Future    Standing Expiration Date:   05/22/2024   CBC with Differential    Standing Status:   Future    Standing Expiration Date:   06/19/2024   Comprehensive metabolic panel    Standing Status:   Future    Standing Expiration Date:   06/19/2024   CBC with Differential    Standing Status:   Future    Standing Expiration Date:   07/17/2024   Comprehensive metabolic panel    Standing Status:   Future    Standing Expiration Date:   07/17/2024      I,Katie Daubenspeck,acting as a scribe for Doreatha Massed, MD.,have documented all relevant documentation on the behalf of Doreatha Massed, MD,as directed by  Doreatha Massed, MD while in the presence of Doreatha Massed, MD.   I, Doreatha Massed MD, have reviewed the above documentation for accuracy and completeness, and I agree with the above.   Doreatha Massed, MD   9/11/20245:01 PM  CHIEF COMPLAINT:   Diagnosis: multiple myeloma    Cancer Staging  No matching staging information was found for the patient.    Prior Therapy: Dara CyBorD   Current Therapy:  Maintenance Revlimid and daratumumab    HISTORY OF PRESENT ILLNESS:   Oncology History  Multiple myeloma not having achieved remission (HCC)  11/01/2020 Initial Diagnosis   Multiple myeloma not having achieved remission (HCC)   11/02/2020 - 11/02/2020 Chemotherapy         11/16/2020 -  Chemotherapy   Patient is on Treatment Plan : MYELOMA NEWLY  DIAGNOSED TRANSPLANT CANDIDATE DaraVRd (Daratumumab SQ) q21d x 6 Cycles (Induction/Consolidation)     11/21/2020 - 02/27/2022 Chemotherapy   Patient is on Treatment Plan : MYELOMA NEWLY DIAGNOSED TRANSPLANT CANDIDATE DaraVRd (Daratumumab SQ) q21d x 6 Cycles (Induction/Consolidation)     04/24/2021 - 04/24/2021 Chemotherapy   Patient is on Treatment Plan : MYELOMA NEWLY DIAGNOSED TRANSPLANT CANDIDATE Daratumumab SQ + Lenalidomide q28d (Maintenance)        INTERVAL HISTORY:   Paul Norton is a 67 y.o. male presenting to clinic today for follow up of multiple myeloma. He was last seen by me on 01/01/23.  Today, he states that he is doing well overall. His appetite level is at 80%. His energy level is at 80%.  PAST MEDICAL HISTORY:   Past Medical History: Past Medical History:  Diagnosis Date   Broken leg    Colonoscopy refused 09/2017   Hyperlipidemia    Hypertension    Multiple myeloma (HCC)    Pneumonia    Refuses treatment 09/2017   refuses screenings such as cancer screening, refuses vaccines, refuses normal preventative care   Vaccine refused by patient    all vaccines as of 09/2017    Surgical History: Past Surgical History:  Procedure Laterality Date   COLONOSCOPY     never, declines as of 09/2017    Social History: Social History   Socioeconomic History   Marital status: Divorced    Spouse name: Not on file   Number of children: 1   Years of education: Not on file   Highest education level: Not on file  Occupational History   Occupation: Disability  Tobacco Use   Smoking status: Never   Smokeless tobacco: Never  Substance and Sexual Activity   Alcohol use: No   Drug use: Never   Sexual activity: Not Currently  Other Topics Concern   Not on file  Social History Narrative   Not on file   Social Determinants of Health   Financial Resource Strain: Medium Risk (11/15/2020)   Overall Financial Resource Strain (CARDIA)    Difficulty of Paying Living Expenses:  Somewhat hard  Food Insecurity: No Food Insecurity (11/15/2020)   Hunger Vital Sign    Worried About Running Out of Food in the Last Year: Never true    Ran Out of Food in the Last Year: Never true  Transportation Needs: No Transportation Needs (11/15/2020)   PRAPARE - Administrator, Civil Service (Medical): No    Lack of Transportation (Non-Medical): No  Physical Activity: Insufficiently Active (11/21/2020)   Exercise Vital Sign    Days of Exercise per Week: 5 days    Minutes of Exercise per Session: 20 min  Stress: No Stress Concern Present (11/21/2020)   [employer] of Occupational Health - Occupational Stress Questionnaire    Feeling of Stress : Not at all  Social Connections: Socially Isolated (11/21/2020)   Social Connection and Isolation Panel [  NHANES]    Frequency of Communication with Friends and Family: More than three times a week    Frequency of Social Gatherings with Friends and Family: More than three times a week    Attends Religious Services: Never    Database administrator or Organizations: No    Attends Banker Meetings: Never    Marital Status: Divorced  Catering manager Violence: Not At Risk (11/21/2020)   Humiliation, Afraid, Rape, and Kick questionnaire    Fear of Current or Ex-Partner: No    Emotionally Abused: No    Physically Abused: No    Sexually Abused: No    Family History: No family history on file.  Current Medications:  Current Outpatient Medications:    acetaminophen (TYLENOL) 325 MG tablet, Take 2 tablets (650 mg total) by mouth every 6 (six) hours as needed for mild pain (or Fever >/= 101)., Disp: 30 tablet, Rfl: 30   acyclovir (ZOVIRAX) 400 MG tablet, Take 1 tablet (400 mg total) by mouth 2 (two) times daily., Disp: 60 tablet, Rfl: 6   amLODipine (NORVASC) 10 MG tablet, TAKE ONE TABLET BY MOUTH ONCE DAILY., Disp: 30 tablet, Rfl: 3   aspirin 81 MG chewable tablet, Chew by mouth daily., Disp: , Rfl:    feeding supplement  (ENSURE ENLIVE / ENSURE PLUS) LIQD, Take 237 mLs by mouth 3 (three) times daily between meals., Disp: 237 mL, Rfl: 12   lenalidomide (REVLIMID) 10 MG capsule, Take 1 capsule (10 mg total) by mouth daily. 21 days on, 7 days off, Disp: 21 capsule, Rfl: 0   OYSCO 500 + D 500-5 MG-MCG TABS, TAKE ONE TABLET BY MOUTH DAILY, Disp: 30 tablet, Rfl: 3   Allergies: No Known Allergies  REVIEW OF SYSTEMS:   Review of Systems  Constitutional:  Negative for chills, fatigue and fever.  HENT:   Negative for lump/mass, mouth sores, nosebleeds, sore throat and trouble swallowing.   Eyes:  Negative for eye problems.  Respiratory:  Negative for cough and shortness of breath.   Cardiovascular:  Negative for chest pain, leg swelling and palpitations.  Gastrointestinal:  Negative for abdominal pain, constipation, diarrhea, nausea and vomiting.  Genitourinary:  Negative for bladder incontinence, difficulty urinating, dysuria, frequency, hematuria and nocturia.   Musculoskeletal:  Negative for arthralgias, back pain, flank pain, myalgias and neck pain.  Skin:  Negative for itching and rash.  Neurological:  Negative for dizziness, headaches and numbness.  Hematological:  Does not bruise/bleed easily.  Psychiatric/Behavioral:  Negative for depression, sleep disturbance and suicidal ideas. The patient is not nervous/anxious.   All other systems reviewed and are negative.    VITALS:   Blood pressure (!) 191/80, pulse 77, temperature 98.4 F (36.9 C), temperature source Oral, resp. rate 18, weight 190 lb 4.8 oz (86.3 kg), SpO2 99%.  Wt Readings from Last 3 Encounters:  03/27/23 190 lb 4.8 oz (86.3 kg)  02/27/23 192 lb (87.1 kg)  01/30/23 196 lb (88.9 kg)    Body mass index is 26.92 kg/m.  Performance status (ECOG): 1 - Symptomatic but completely ambulatory  PHYSICAL EXAM:   Physical Exam Vitals and nursing note reviewed. Exam conducted with a chaperone present.  Constitutional:      Appearance: Normal  appearance.  Cardiovascular:     Rate and Rhythm: Normal rate and regular rhythm.     Pulses: Normal pulses.     Heart sounds: Normal heart sounds.  Pulmonary:     Effort: Pulmonary effort is normal.  Breath sounds: Normal breath sounds.  Abdominal:     Palpations: Abdomen is soft. There is no hepatomegaly, splenomegaly or mass.     Tenderness: There is no abdominal tenderness.  Musculoskeletal:     Right lower leg: No edema.     Left lower leg: No edema.  Lymphadenopathy:     Cervical: No cervical adenopathy.     Right cervical: No superficial, deep or posterior cervical adenopathy.    Left cervical: No superficial, deep or posterior cervical adenopathy.     Upper Body:     Right upper body: No supraclavicular or axillary adenopathy.     Left upper body: No supraclavicular or axillary adenopathy.  Neurological:     General: No focal deficit present.     Mental Status: He is alert and oriented to person, place, and time.  Psychiatric:        Mood and Affect: Mood normal.        Behavior: Behavior normal.     LABS:      Latest Ref Rng & Units 03/27/2023   12:28 PM 02/27/2023   12:22 PM 01/30/2023   12:23 PM  CBC  WBC 4.0 - 10.5 K/uL 8.0  8.3  8.3   Hemoglobin 13.0 - 17.0 g/dL 13.0  86.5  78.4   Hematocrit 39.0 - 52.0 % 43.7  39.5  41.9   Platelets 150 - 400 K/uL 231  205  168       Latest Ref Rng & Units 03/27/2023   12:28 PM 02/27/2023   12:22 PM 01/30/2023   12:23 PM  CMP  Glucose 70 - 99 mg/dL 696  295  284   BUN 8 - 23 mg/dL 23  25  28    Creatinine 0.61 - 1.24 mg/dL 1.32  4.40  1.02   Sodium 135 - 145 mmol/L 135  134  134   Potassium 3.5 - 5.1 mmol/L 3.7  3.7  3.8   Chloride 98 - 111 mmol/L 97  98  100   CO2 22 - 32 mmol/L 28  25  27    Calcium 8.9 - 10.3 mg/dL 8.3  8.3  8.5   Total Protein 6.5 - 8.1 g/dL 6.4  6.3  6.4   Total Bilirubin 0.3 - 1.2 mg/dL 1.7  1.4  1.6   Alkaline Phos 38 - 126 U/L 66  62  64   AST 15 - 41 U/L 15  14  13    ALT 0 - 44 U/L 16  17   17       Lab Results  Component Value Date   CEA1 1.1 10/30/2020   /  CEA  Date Value Ref Range Status  10/30/2020 1.1 0.0 - 4.7 ng/mL Final    Comment:    (NOTE)                             Nonsmokers          <3.9                             Smokers             <5.6 Roche [employer]ostics Electrochemiluminescence Immunoassay (ECLIA) Values obtained with different assay methods or kits cannot be used interchangeably.  Results cannot be interpreted as absolute evidence of the presence or absence of malignant disease. Performed At: [facility] [address] [place],  [place] [id number] Jolene Schimke MD MW:[id number]    No results found for: "PSA1" No results found for: "CAN199" No results found for: "CAN125"  Lab Results  Component Value Date   TOTALPROTELP 5.6 (L) 02/27/2023   TOTALPROTELP 5.9 (L) 02/27/2023   ALBUMINELP 3.3 02/27/2023   A1GS 0.3 02/27/2023   A2GS 0.8 02/27/2023   BETS 0.8 02/27/2023   GAMS 0.2 (L) 02/27/2023   MSPIKE Not Observed 02/27/2023   SPEI Comment 02/27/2023   Lab Results  Component Value Date   TIBC 242 (L) 10/30/2020   TIBC 266 10/29/2020   FERRITIN 1,406 (H) 10/30/2020   FERRITIN 627 (H) 10/29/2020   IRONPCTSAT 42 (H) 10/30/2020   IRONPCTSAT 47 (H) 10/29/2020   Lab Results  Component Value Date   LDH 113 01/30/2022   LDH 107 01/02/2022   LDH 135 06/19/2021     STUDIES:   No results found.

## 2023-03-29 ENCOUNTER — Other Ambulatory Visit: Payer: Self-pay

## 2023-03-30 ENCOUNTER — Other Ambulatory Visit: Payer: Self-pay | Admitting: Hematology

## 2023-03-30 ENCOUNTER — Other Ambulatory Visit: Payer: Self-pay

## 2023-04-01 ENCOUNTER — Encounter (HOSPITAL_COMMUNITY): Payer: Self-pay | Admitting: Hematology

## 2023-04-01 ENCOUNTER — Encounter: Payer: Self-pay | Admitting: Hematology

## 2023-04-03 ENCOUNTER — Other Ambulatory Visit: Payer: Self-pay

## 2023-04-03 MED ORDER — LENALIDOMIDE 10 MG PO CAPS: 10.0000 mg | ORAL_CAPSULE | Freq: Every day | ORAL | 0 refills | Status: DC

## 2023-04-03 NOTE — Telephone Encounter (Signed)
Chart reviewed. Revlimid refilled per last office note with Dr. Ellin Saba.

## 2023-04-24 ENCOUNTER — Other Ambulatory Visit: Payer: Self-pay

## 2023-04-24 DIAGNOSIS — C9000 Multiple myeloma not having achieved remission: Secondary | ICD-10-CM

## 2023-04-24 NOTE — Progress Notes (Signed)
Lab orders entered

## 2023-04-25 ENCOUNTER — Inpatient Hospital Stay: Payer: 59 | Attending: Hematology | Admitting: Oncology

## 2023-04-25 ENCOUNTER — Inpatient Hospital Stay: Payer: 59

## 2023-04-25 ENCOUNTER — Ambulatory Visit: Payer: 59 | Admitting: Hematology

## 2023-04-25 VITALS — BP 161/66 | HR 58 | Temp 96.2°F | Resp 20 | Wt 194.0 lb

## 2023-04-25 DIAGNOSIS — C9000 Multiple myeloma not having achieved remission: Secondary | ICD-10-CM | POA: Diagnosis not present

## 2023-04-25 DIAGNOSIS — Z79899 Other long term (current) drug therapy: Secondary | ICD-10-CM | POA: Insufficient documentation

## 2023-04-25 DIAGNOSIS — Z5112 Encounter for antineoplastic immunotherapy: Secondary | ICD-10-CM | POA: Diagnosis not present

## 2023-04-25 LAB — CBC WITH DIFFERENTIAL/PLATELET
Abs Immature Granulocytes: 0.04 10*3/uL (ref 0.00–0.07)
Basophils Absolute: 0.1 10*3/uL (ref 0.0–0.1)
Basophils Relative: 1 %
Eosinophils Absolute: 0.2 10*3/uL (ref 0.0–0.5)
Eosinophils Relative: 3 %
HCT: 44.2 % (ref 39.0–52.0)
Hemoglobin: 14.2 g/dL (ref 13.0–17.0)
Immature Granulocytes: 1 %
Lymphocytes Relative: 20 %
Lymphs Abs: 1.5 10*3/uL (ref 0.7–4.0)
MCH: 26.2 pg (ref 26.0–34.0)
MCHC: 32.1 g/dL (ref 30.0–36.0)
MCV: 81.5 fL (ref 80.0–100.0)
Monocytes Absolute: 0.8 10*3/uL (ref 0.1–1.0)
Monocytes Relative: 10 %
Neutro Abs: 5 10*3/uL (ref 1.7–7.7)
Neutrophils Relative %: 65 %
Platelets: 165 10*3/uL (ref 150–400)
RBC: 5.42 MIL/uL (ref 4.22–5.81)
RDW: 17 % — ABNORMAL HIGH (ref 11.5–15.5)
WBC: 7.6 10*3/uL (ref 4.0–10.5)
nRBC: 0 % (ref 0.0–0.2)

## 2023-04-25 LAB — MAGNESIUM: Magnesium: 2 mg/dL (ref 1.7–2.4)

## 2023-04-25 LAB — COMPREHENSIVE METABOLIC PANEL
ALT: 20 U/L (ref 0–44)
AST: 20 U/L (ref 15–41)
Albumin: 3.9 g/dL (ref 3.5–5.0)
Alkaline Phosphatase: 65 U/L (ref 38–126)
Anion gap: 8 (ref 5–15)
BUN: 25 mg/dL — ABNORMAL HIGH (ref 8–23)
CO2: 28 mmol/L (ref 22–32)
Calcium: 8.5 mg/dL — ABNORMAL LOW (ref 8.9–10.3)
Chloride: 100 mmol/L (ref 98–111)
Creatinine, Ser: 1.06 mg/dL (ref 0.61–1.24)
GFR, Estimated: >60 mL/min (ref >60)
Glucose, Bld: 132 mg/dL — ABNORMAL HIGH (ref 70–99)
Potassium: 3.5 mmol/L (ref 3.5–5.1)
Sodium: 136 mmol/L (ref 135–145)
Total Bilirubin: 2 mg/dL — ABNORMAL HIGH (ref 0.3–1.2)
Total Protein: 6.5 g/dL (ref 6.5–8.1)

## 2023-04-25 MED ORDER — CETIRIZINE HCL 10 MG PO TABS
10.0000 mg | ORAL_TABLET | Freq: Once | ORAL | Status: AC
  Administered 2023-04-25: 10 mg via ORAL
  Filled 2023-04-25: qty 1

## 2023-04-25 MED ORDER — DEXAMETHASONE 4 MG PO TABS
40.0000 mg | ORAL_TABLET | Freq: Once | ORAL | Status: AC
  Administered 2023-04-25: 40 mg via ORAL
  Filled 2023-04-25: qty 10

## 2023-04-25 MED ORDER — DARATUMUMAB-HYALURONIDASE-FIHJ 1800-30000 MG-UT/15ML ~~LOC~~ SOLN
1800.0000 mg | Freq: Once | SUBCUTANEOUS | Status: AC
  Administered 2023-04-25: 1800 mg via SUBCUTANEOUS
  Filled 2023-04-25: qty 15

## 2023-04-25 MED ORDER — DENOSUMAB 120 MG/1.7ML ~~LOC~~ SOLN
120.0000 mg | Freq: Once | SUBCUTANEOUS | Status: AC
  Administered 2023-04-25: 120 mg via SUBCUTANEOUS
  Filled 2023-04-25: qty 1.7

## 2023-04-25 MED ORDER — ACETAMINOPHEN 325 MG PO TABS
650.0000 mg | ORAL_TABLET | Freq: Once | ORAL | Status: AC
  Administered 2023-04-25: 650 mg via ORAL
  Filled 2023-04-25: qty 2

## 2023-04-25 NOTE — Progress Notes (Signed)
Patient presents today for Daratumumab and Xgeva injections.  Patient is in satisfactory condition with no new complaints voiced.  Vital signs are stable.  Labs reviewed.  Bilirubin today is 2.0.  CMP parameters are in the treatment plan so MD made aware. All other labs are within treatment parameters.  We will proceed with treatment per MD orders.    Patient tolerated injections well with no complaints voiced.  Patient left ambulatory in stable condition.  Vital signs stable at discharge.  Follow up as scheduled.

## 2023-04-25 NOTE — Progress Notes (Signed)
Patient presents today for Daratumumab and Xgeva injections.  Patient is in satisfactory condition with no new complaints voiced.  Vital signs are stable.  Labs reviewed.  Bilirubin today is 2.0.  CMP results are included in the treatment plan, so MD made aware.  We will proceed with treatment per MD orders.    Patient tolerated injections well with no complaints voiced.  Patient left ambulatory in stable condition.  Vital signs stable at discharge.  Follow up as scheduled.

## 2023-04-25 NOTE — Patient Instructions (Signed)
[facility]  Discharge Instructions: Thank you for choosing [facility] to provide your oncology and hematology care.  If you have a lab appointment with the Cancer Center - please note that after April 8th, 2024, all labs will be drawn in the cancer center.  You do not have to check in or register with the main entrance as you have in the past but will complete your check-in in the cancer center.  Wear comfortable clothing and clothing appropriate for easy access to any Portacath or PICC line.   We strive to give you quality time with your provider. You may need to reschedule your appointment if you arrive late (15 or more minutes).  Arriving late affects you and other patients whose appointments are after yours.  Also, if you miss three or more appointments without notifying the office, you may be dismissed from the clinic at the provider's discretion.      For prescription refill requests, have your pharmacy contact our office and allow 72 hours for refills to be completed.    Today you received the following chemotherapy and/or immunotherapy agents Daratumumab/Xgeva.   Daratumumab; Hyaluronidase Injection What is this medication? DARATUMUMAB; HYALURONIDASE (dar a toom ue mab; hye al ur ON i dase) treats multiple myeloma, a type of bone marrow cancer. Daratumumab works by blocking a protein that causes cancer cells to grow and multiply. This helps to slow or stop the spread of cancer cells. Hyaluronidase works by increasing the absorption of other medications in the body to help them work better. This medication may also be used treat amyloidosis, a condition that causes the buildup of a protein (amyloid) in your body. It works by reducing the buildup of this protein, which decreases symptoms. It is a combination medication that contains a monoclonal antibody. This medicine may be used for other purposes; ask your health care provider or pharmacist if you have  questions. COMMON BRAND NAME(S): DARZALEX FASPRO What should I tell my care team before I take this medication? They need to know if you have any of these conditions: Heart disease Infection, such as chickenpox, cold sores, herpes, hepatitis B Lung or breathing disease An unusual or allergic reaction to daratumumab, hyaluronidase, other medications, foods, dyes, or preservatives Pregnant or trying to get pregnant Breast-feeding How should I use this medication? This medication is injected under the skin. It is given by your care team in a hospital or clinic setting. Talk to your care team about the use of this medication in children. Special care may be needed. Overdosage: If you think you have taken too much of this medicine contact a poison control center or emergency room at once. NOTE: This medicine is only for you. Do not share this medicine with others. What if I miss a dose? Keep appointments for follow-up doses. It is important not to miss your dose. Call your care team if you are unable to keep an appointment. What may interact with this medication? Interactions have not been studied. This list may not describe all possible interactions. Give your health care provider a list of all the medicines, herbs, non-prescription drugs, or dietary supplements you use. Also tell them if you smoke, drink alcohol, or use illegal drugs. Some items may interact with your medicine. What should I watch for while using this medication? Your condition will be monitored carefully while you are receiving this medication. This medication can cause serious allergic reactions. To reduce your risk, your care team  may give you other medication to take before receiving this one. Be sure to follow the directions from your care team. This medication can affect the results of blood tests to match your blood type. These changes can last for up to 6 months after the final dose. Your care team will do blood tests to  match your blood type before you start treatment. Tell all of your care team that you are being treated with this medication before receiving a blood transfusion. This medication can affect the results of some tests used to determine treatment response; extra tests may be needed to evaluate response. Talk to your care team if you wish to become pregnant or think you are pregnant. This medication can cause serious birth defects if taken during pregnancy and for 3 months after the last dose. A reliable form of contraception is recommended while taking this medication and for 3 months after the last dose. Talk to your care team about effective forms of contraception. Do not breast-feed while taking this medication. What side effects may I notice from receiving this medication? Side effects that you should report to your care team as soon as possible: Allergic reactions--skin rash, itching, hives, swelling of the face, lips, tongue, or throat Heart rhythm changes--fast or irregular heartbeat, dizziness, feeling faint or lightheaded, chest pain, trouble breathing Infection--fever, chills, cough, sore throat, wounds that don't heal, pain or trouble when passing urine, general feeling of discomfort or being unwell Infusion reactions--chest pain, shortness of breath or trouble breathing, feeling faint or lightheaded Sudden eye pain or change in vision such as blurry vision, seeing halos around lights, vision loss Unusual bruising or bleeding Side effects that usually do not require medical attention (report to your care team if they continue or are bothersome): Constipation Diarrhea Fatigue Nausea Pain, tingling, or numbness in the hands or feet Swelling of the ankles, hands, or feet This list may not describe all possible side effects. Call your doctor for medical advice about side effects. You may report side effects to FDA at 1-800-FDA-1088. Where should I keep my medication? This medication is given  in a hospital or clinic. It will not be stored at home. NOTE: This sheet is a summary. It may not cover all possible information. If you have questions about this medicine, talk to your doctor, pharmacist, or health care provider.  2024 Elsevier/Gold Standard (2021-11-07 00:00:00)    Denosumab Injection (Oncology) What is this medication? DENOSUMAB (den oh SUE mab) prevents weakened bones caused by cancer. It may also be used to treat noncancerous bone tumors that cannot be removed by surgery. It can also be used to treat high calcium levels in the blood caused by cancer. It works by blocking a protein that causes bones to break down quickly. This slows down the release of calcium from bones, which lowers calcium levels in your blood. It also makes your bones stronger and less likely to break (fracture). This medicine may be used for other purposes; ask your health care provider or pharmacist if you have questions. COMMON BRAND NAME(S): XGEVA What should I tell my care team before I take this medication? They need to know if you have any of these conditions: Dental disease Having surgery or tooth extraction Infection Kidney disease Low levels of calcium or vitamin D in the blood Malnutrition On hemodialysis Skin conditions or sensitivity Thyroid or parathyroid disease An unusual reaction to denosumab, other medications, foods, dyes, or preservatives Pregnant or trying to get pregnant Breast-feeding How  should I use this medication? This medication is for injection under the skin. It is given by your care team in a hospital or clinic setting. A special MedGuide will be given to you before each treatment. Be sure to read this information carefully each time. Talk to your care team about the use of this medication in children. While it may be prescribed for children as young as 13 years for selected conditions, precautions do apply. Overdosage: If you think you have taken too much of this  medicine contact a poison control center or emergency room at once. NOTE: This medicine is only for you. Do not share this medicine with others. What if I miss a dose? Keep appointments for follow-up doses. It is important not to miss your dose. Call your care team if you are unable to keep an appointment. What may interact with this medication? Do not take this medication with any of the following: Other medications containing denosumab This medication may also interact with the following: Medications that lower your chance of fighting infection Steroid medications, such as prednisone or cortisone This list may not describe all possible interactions. Give your health care provider a list of all the medicines, herbs, non-prescription drugs, or dietary supplements you use. Also tell them if you smoke, drink alcohol, or use illegal drugs. Some items may interact with your medicine. What should I watch for while using this medication? Your condition will be monitored carefully while you are receiving this medication. You may need blood work while taking this medication. This medication may increase your risk of getting an infection. Call your care team for advice if you get a fever, chills, sore throat, or other symptoms of a cold or flu. Do not treat yourself. Try to avoid being around people who are sick. You should make sure you get enough calcium and vitamin D while you are taking this medication, unless your care team tells you not to. Discuss the foods you eat and the vitamins you take with your care team. Some people who take this medication have severe bone, joint, or muscle pain. This medication may also increase your risk for jaw problems or a broken thigh bone. Tell your care team right away if you have severe pain in your jaw, bones, joints, or muscles. Tell your care team if you have any pain that does not go away or that gets worse. Talk to your care team if you may be pregnant. Serious  birth defects can occur if you take this medication during pregnancy and for 5 months after the last dose. You will need a negative pregnancy test before starting this medication. Contraception is recommended while taking this medication and for 5 months after the last dose. Your care team can help you find the option that works for you. What side effects may I notice from receiving this medication? Side effects that you should report to your care team as soon as possible: Allergic reactions--skin rash, itching, hives, swelling of the face, lips, tongue, or throat Bone, joint, or muscle pain Low calcium level--muscle pain or cramps, confusion, tingling, or numbness in the hands or feet Osteonecrosis of the jaw--pain, swelling, or redness in the mouth, numbness of the jaw, poor healing after dental work, unusual discharge from the mouth, visible bones in the mouth Side effects that usually do not require medical attention (report to your care team if they continue or are bothersome): Cough Diarrhea Fatigue Headache Nausea This list may not describe all possible  side effects. Call your doctor for medical advice about side effects. You may report side effects to FDA at 1-800-FDA-1088. Where should I keep my medication? This medication is given in a hospital or clinic. It will not be stored at home. NOTE: This sheet is a summary. It may not cover all possible information. If you have questions about this medicine, talk to your doctor, pharmacist, or health care provider.  2024 Elsevier/Gold Standard (2021-11-22 00:00:00)        To help prevent nausea and vomiting after your treatment, we encourage you to take your nausea medication as directed.  BELOW ARE SYMPTOMS THAT SHOULD BE REPORTED IMMEDIATELY: *FEVER GREATER THAN 100.4 F (38 C) OR HIGHER *CHILLS OR SWEATING *NAUSEA AND VOMITING THAT IS NOT CONTROLLED WITH YOUR NAUSEA MEDICATION *UNUSUAL SHORTNESS OF BREATH *UNUSUAL BRUISING OR  BLEEDING *URINARY PROBLEMS (pain or burning when urinating, or frequent urination) *BOWEL PROBLEMS (unusual diarrhea, constipation, pain near the anus) TENDERNESS IN MOUTH AND THROAT WITH OR WITHOUT PRESENCE OF ULCERS (sore throat, sores in mouth, or a toothache) UNUSUAL RASH, SWELLING OR PAIN  UNUSUAL VAGINAL DISCHARGE OR ITCHING   Items with * indicate a potential emergency and should be followed up as soon as possible or go to the Emergency Department if any problems should occur.  Please show the CHEMOTHERAPY ALERT CARD or IMMUNOTHERAPY ALERT CARD at check-in to the Emergency Department and triage nurse.  Should you have questions after your visit or need to cancel or reschedule your appointment, please contact [facility] [phone]  and follow the prompts.  Office hours are 8:00 a.m. to 4:30 p.m. Monday - Friday. Please note that voicemails left after 4:00 p.m. may not be returned until the following business day.  We are closed weekends and major holidays. You have access to a nurse at all times for urgent questions. Please call the main number to the clinic [phone] and follow the prompts.  For any non-urgent questions, you may also contact your provider using MyChart. We now offer e-Visits for anyone 62 and older to request care online for non-urgent symptoms. For details visit [email or web address].   Also download the MyChart app! Go to the app store, search "MyChart", open the app, select [facility], and log in with your MyChart username and password.

## 2023-04-25 NOTE — Progress Notes (Signed)
Ok to proceed with Rivka Barbara today with Ca 8.5, Alb 3.9, CCa 8.6 per MD.  Richardean Sale, RPH, BCPS, BCOP 04/25/2023 2:48 PM

## 2023-04-29 ENCOUNTER — Other Ambulatory Visit: Payer: Self-pay | Admitting: *Deleted

## 2023-04-29 MED ORDER — AMLODIPINE BESYLATE 10 MG PO TABS: 10.0000 mg | ORAL_TABLET | Freq: Every day | ORAL | 3 refills | Status: DC

## 2023-05-03 ENCOUNTER — Other Ambulatory Visit: Payer: Self-pay

## 2023-05-03 MED ORDER — LENALIDOMIDE 10 MG PO CAPS: 10.0000 mg | ORAL_CAPSULE | Freq: Every day | ORAL | 0 refills | Status: DC

## 2023-05-03 NOTE — Telephone Encounter (Signed)
Chart reviewed. Revlimid refilled per last office note with Dr. Katragadda.  

## 2023-05-22 ENCOUNTER — Other Ambulatory Visit: Payer: Self-pay

## 2023-05-22 DIAGNOSIS — C9000 Multiple myeloma not having achieved remission: Secondary | ICD-10-CM

## 2023-05-23 ENCOUNTER — Inpatient Hospital Stay: Payer: 59 | Attending: Hematology

## 2023-05-23 ENCOUNTER — Inpatient Hospital Stay: Payer: 59

## 2023-05-23 VITALS — BP 156/68 | HR 62 | Temp 97.5°F | Resp 18 | Wt 191.2 lb

## 2023-05-23 DIAGNOSIS — Z79899 Other long term (current) drug therapy: Secondary | ICD-10-CM | POA: Insufficient documentation

## 2023-05-23 DIAGNOSIS — C9000 Multiple myeloma not having achieved remission: Secondary | ICD-10-CM

## 2023-05-23 DIAGNOSIS — Z5112 Encounter for antineoplastic immunotherapy: Secondary | ICD-10-CM | POA: Diagnosis not present

## 2023-05-23 LAB — CBC WITH DIFFERENTIAL/PLATELET
Abs Immature Granulocytes: 0.02 10*3/uL (ref 0.00–0.07)
Basophils Absolute: 0.1 10*3/uL (ref 0.0–0.1)
Basophils Relative: 1 %
Eosinophils Absolute: 0.1 10*3/uL (ref 0.0–0.5)
Eosinophils Relative: 2 %
HCT: 43.4 % (ref 39.0–52.0)
Hemoglobin: 14.1 g/dL (ref 13.0–17.0)
Immature Granulocytes: 0 %
Lymphocytes Relative: 16 %
Lymphs Abs: 1.4 10*3/uL (ref 0.7–4.0)
MCH: 26.9 pg (ref 26.0–34.0)
MCHC: 32.5 g/dL (ref 30.0–36.0)
MCV: 82.8 fL (ref 80.0–100.0)
Monocytes Absolute: 0.9 10*3/uL (ref 0.1–1.0)
Monocytes Relative: 10 %
Neutro Abs: 6 10*3/uL (ref 1.7–7.7)
Neutrophils Relative %: 71 %
Platelets: 160 10*3/uL (ref 150–400)
RBC: 5.24 MIL/uL (ref 4.22–5.81)
RDW: 16.6 % — ABNORMAL HIGH (ref 11.5–15.5)
WBC: 8.4 10*3/uL (ref 4.0–10.5)
nRBC: 0 % (ref 0.0–0.2)

## 2023-05-23 LAB — COMPREHENSIVE METABOLIC PANEL
ALT: 18 U/L (ref 0–44)
AST: 17 U/L (ref 15–41)
Albumin: 4 g/dL (ref 3.5–5.0)
Alkaline Phosphatase: 66 U/L (ref 38–126)
Anion gap: 11 (ref 5–15)
BUN: 27 mg/dL — ABNORMAL HIGH (ref 8–23)
CO2: 26 mmol/L (ref 22–32)
Calcium: 9 mg/dL (ref 8.9–10.3)
Chloride: 101 mmol/L (ref 98–111)
Creatinine, Ser: 1.23 mg/dL (ref 0.61–1.24)
GFR, Estimated: >60 mL/min (ref >60)
Glucose, Bld: 123 mg/dL — ABNORMAL HIGH (ref 70–99)
Potassium: 3.8 mmol/L (ref 3.5–5.1)
Sodium: 138 mmol/L (ref 135–145)
Total Bilirubin: 1.6 mg/dL — ABNORMAL HIGH (ref <1.2)
Total Protein: 6.4 g/dL — ABNORMAL LOW (ref 6.5–8.1)

## 2023-05-23 LAB — MAGNESIUM: Magnesium: 2.1 mg/dL (ref 1.7–2.4)

## 2023-05-23 MED ORDER — CETIRIZINE HCL 10 MG PO TABS
10.0000 mg | ORAL_TABLET | Freq: Once | ORAL | Status: AC
  Administered 2023-05-23: 10 mg via ORAL
  Filled 2023-05-23: qty 1

## 2023-05-23 MED ORDER — DENOSUMAB 120 MG/1.7ML ~~LOC~~ SOLN
120.0000 mg | Freq: Once | SUBCUTANEOUS | Status: AC
  Administered 2023-05-23: 120 mg via SUBCUTANEOUS
  Filled 2023-05-23: qty 1.7

## 2023-05-23 MED ORDER — DARATUMUMAB-HYALURONIDASE-FIHJ 1800-30000 MG-UT/15ML ~~LOC~~ SOLN
1800.0000 mg | Freq: Once | SUBCUTANEOUS | Status: AC
  Administered 2023-05-23: 1800 mg via SUBCUTANEOUS
  Filled 2023-05-23: qty 15

## 2023-05-23 MED ORDER — DEXAMETHASONE 4 MG PO TABS
40.0000 mg | ORAL_TABLET | Freq: Once | ORAL | Status: AC
  Administered 2023-05-23: 40 mg via ORAL
  Filled 2023-05-23: qty 10

## 2023-05-23 MED ORDER — ACETAMINOPHEN 325 MG PO TABS
650.0000 mg | ORAL_TABLET | Freq: Once | ORAL | Status: AC
  Administered 2023-05-23: 650 mg via ORAL
  Filled 2023-05-23: qty 2

## 2023-05-23 NOTE — Patient Instructions (Signed)
[facility] - A DEPT OF [facility]  Discharge Instructions: Thank you for choosing [facility] to provide your oncology and hematology care.  If you have a lab appointment with the Cancer Center - please note that after April 8th, 2024, all labs will be drawn in the cancer center.  You do not have to check in or register with the main entrance as you have in the past but will complete your check-in in the cancer center.  Wear comfortable clothing and clothing appropriate for easy access to any Portacath or PICC line.   We strive to give you quality time with your provider. You may need to reschedule your appointment if you arrive late (15 or more minutes).  Arriving late affects you and other patients whose appointments are after yours.  Also, if you miss three or more appointments without notifying the office, you may be dismissed from the clinic at the provider's discretion.      For prescription refill requests, have your pharmacy contact our office and allow 72 hours for refills to be completed.    Today you received the following chemotherapy and/or immunotherapy agents Dara return as scheduled.   To help prevent nausea and vomiting after your treatment, we encourage you to take your nausea medication as directed.  BELOW ARE SYMPTOMS THAT SHOULD BE REPORTED IMMEDIATELY: *FEVER GREATER THAN 100.4 F (38 C) OR HIGHER *CHILLS OR SWEATING *NAUSEA AND VOMITING THAT IS NOT CONTROLLED WITH YOUR NAUSEA MEDICATION *UNUSUAL SHORTNESS OF BREATH *UNUSUAL BRUISING OR BLEEDING *URINARY PROBLEMS (pain or burning when urinating, or frequent urination) *BOWEL PROBLEMS (unusual diarrhea, constipation, pain near the anus) TENDERNESS IN MOUTH AND THROAT WITH OR WITHOUT PRESENCE OF ULCERS (sore throat, sores in mouth, or a toothache) UNUSUAL RASH, SWELLING OR PAIN  UNUSUAL VAGINAL DISCHARGE OR ITCHING   Items with * indicate a potential emergency and should  be followed up as soon as possible or go to the Emergency Department if any problems should occur.  Please show the CHEMOTHERAPY ALERT CARD or IMMUNOTHERAPY ALERT CARD at check-in to the Emergency Department and triage nurse.  Should you have questions after your visit or need to cancel or reschedule your appointment, please contact [facility] - A DEPT OF [facility] [phone]  and follow the prompts.  Office hours are 8:00 a.m. to 4:30 p.m. Monday - Friday. Please note that voicemails left after 4:00 p.m. may not be returned until the following business day.  We are closed weekends and major holidays. You have access to a nurse at all times for urgent questions. Please call the main number to the clinic [phone] and follow the prompts.  For any non-urgent questions, you may also contact your provider using MyChart. We now offer e-Visits for anyone 29 and older to request care online for non-urgent symptoms. For details visit [email or web address].   Also download the MyChart app! Go to the app store, search "MyChart", open the app, select [facility], and log in with your MyChart username and password.

## 2023-05-23 NOTE — Progress Notes (Signed)
Patient presents today for Paul Norton, bilirubin is 1.6 Dr. Ellin Saba made aware. Okay to treat per Dr. Ellin Saba. Patient tolerated Daratumumab injection with no complaints voiced. See MAR for details. Lab reviewed. Injection site clean and dry with no bruising or swelling noted at site. Band aid applied.  Patient taking calcium as directed. Denied tooth, jaw, and leg pain. No recent or upcoming dental visits. Labs reviewed. Patient tolerated injection with no complaints voiced. See MAR for details. Patient stable during and after injection. Site clean and dry with no bruising or swelling noted. Band aid applied. Vss with discharge and left in satisfactory condition with no s/s of distress.

## 2023-05-27 ENCOUNTER — Other Ambulatory Visit: Payer: Self-pay | Admitting: Hematology

## 2023-05-28 ENCOUNTER — Other Ambulatory Visit: Payer: Self-pay

## 2023-05-28 MED ORDER — LENALIDOMIDE 10 MG PO CAPS: 10.0000 mg | ORAL_CAPSULE | Freq: Every day | ORAL | 0 refills | Status: DC

## 2023-05-28 NOTE — Telephone Encounter (Signed)
Chart reviewed. Revlimid refilled per last office note with Dr. Katragadda.  

## 2023-06-04 ENCOUNTER — Other Ambulatory Visit: Payer: Self-pay

## 2023-06-04 ENCOUNTER — Other Ambulatory Visit: Payer: Self-pay | Admitting: *Deleted

## 2023-06-12 ENCOUNTER — Inpatient Hospital Stay: Payer: 59

## 2023-06-12 DIAGNOSIS — Z79899 Other long term (current) drug therapy: Secondary | ICD-10-CM | POA: Diagnosis not present

## 2023-06-12 DIAGNOSIS — C9000 Multiple myeloma not having achieved remission: Secondary | ICD-10-CM

## 2023-06-12 DIAGNOSIS — Z5112 Encounter for antineoplastic immunotherapy: Secondary | ICD-10-CM | POA: Diagnosis not present

## 2023-06-12 LAB — CBC WITH DIFFERENTIAL/PLATELET
Abs Immature Granulocytes: 0.03 10*3/uL (ref 0.00–0.07)
Basophils Absolute: 0.1 10*3/uL (ref 0.0–0.1)
Basophils Relative: 2 %
Eosinophils Absolute: 0.1 10*3/uL (ref 0.0–0.5)
Eosinophils Relative: 2 %
HCT: 45.4 % (ref 39.0–52.0)
Hemoglobin: 14.8 g/dL (ref 13.0–17.0)
Immature Granulocytes: 0 %
Lymphocytes Relative: 22 %
Lymphs Abs: 1.6 10*3/uL (ref 0.7–4.0)
MCH: 26.9 pg (ref 26.0–34.0)
MCHC: 32.6 g/dL (ref 30.0–36.0)
MCV: 82.5 fL (ref 80.0–100.0)
Monocytes Absolute: 1.4 10*3/uL — ABNORMAL HIGH (ref 0.1–1.0)
Monocytes Relative: 18 %
Neutro Abs: 4.3 10*3/uL (ref 1.7–7.7)
Neutrophils Relative %: 56 %
Platelets: 213 10*3/uL (ref 150–400)
RBC: 5.5 MIL/uL (ref 4.22–5.81)
RDW: 16.1 % — ABNORMAL HIGH (ref 11.5–15.5)
WBC: 7.6 10*3/uL (ref 4.0–10.5)
nRBC: 0 % (ref 0.0–0.2)

## 2023-06-12 LAB — COMPREHENSIVE METABOLIC PANEL
ALT: 16 U/L (ref 0–44)
AST: 16 U/L (ref 15–41)
Albumin: 3.9 g/dL (ref 3.5–5.0)
Alkaline Phosphatase: 62 U/L (ref 38–126)
Anion gap: 10 (ref 5–15)
BUN: 21 mg/dL (ref 8–23)
CO2: 26 mmol/L (ref 22–32)
Calcium: 8.8 mg/dL — ABNORMAL LOW (ref 8.9–10.3)
Chloride: 101 mmol/L (ref 98–111)
Creatinine, Ser: 0.78 mg/dL (ref 0.61–1.24)
GFR, Estimated: >60 mL/min (ref >60)
Glucose, Bld: 122 mg/dL — ABNORMAL HIGH (ref 70–99)
Potassium: 4.1 mmol/L (ref 3.5–5.1)
Sodium: 137 mmol/L (ref 135–145)
Total Bilirubin: 1.3 mg/dL — ABNORMAL HIGH (ref <1.2)
Total Protein: 6.4 g/dL — ABNORMAL LOW (ref 6.5–8.1)

## 2023-06-14 LAB — KAPPA/LAMBDA LIGHT CHAINS
Kappa free light chain: 7 mg/L (ref 3.3–19.4)
Kappa, lambda light chain ratio: 1.25 (ref 0.26–1.65)
Lambda free light chains: 5.6 mg/L — ABNORMAL LOW (ref 5.7–26.3)

## 2023-06-18 NOTE — Progress Notes (Signed)
[facility] [address], [place] [address]    Clinic Day:  06/20/2023  Referring physician: Doreatha Massed, MD  Patient Care Team: Doreatha Massed, MD as PCP - General (Hematology) Doreatha Massed, MD as Medical Oncologist (Oncology)   ASSESSMENT & PLAN:   Assessment: 1.  IgG kappa light chain multiple myeloma: - Admission with hypercalcemia and renal failure. - CT CAP showed extensive lytic foci throughout the axial and appendicular skeleton of the chest, abdomen and pelvis.  Splenomegaly with an enlarged lobular spleen, nonspecific. - Bone marrow biopsy on 11/01/2020 shows 85% atypical plasma cells in the aspirate. - FISH panel positive for 1 p-,-13,14q-,16q-,17p-/-17 and 20q- - Cytogenetics-no metaphases available for analysis. - SPEP with 5.4 g of M spike.  Free light chain ratio 1030.  LDH normal.  Beta-2 microglobulin 18.7.  24-hour urine total protein 3.6 g. - 6 cycles of Dara VRD from 11/21/2020 through 03/07/2021. - Patient declined bone marrow transplant. - Maintenance Revlimid and monthly daratumumab started on 03/27/2021.   2.  Social/family history: - He worked as a Naval architect.  He lives by himself. - Mother with breast and ovarian cancer.  Father had metastatic kidney cancer.  His brother's daughter has CML.    Plan: 1.  IgG kappa light chain multiple myeloma, high risk: - Reports tolerating Revlimid and Darzalex very well.  No infections reported. - Reviewed labs from 06/12/2023: Normal LFTs and creatinine.  Electrolytes are normal.  CBC grossly normal.  Serum free light chain ratio is 1.25 with kappa light chain 7.0.  SPEP and immunofixation are pending. - Continue Revlimid 10 mg 3 weeks on/1 week of.  Proceed with Darzalex today and every 4 weeks. - RTC 3 months for follow-up with repeat myeloma labs.   2.  Myeloma bone disease: - Continue calcium and vitamin D supplements.  Calcium is 8.8.  Continue denosumab  monthly.  3.  ID prophylaxis: - Continue acyclovir twice daily and aspirin 81 mg daily.    Orders Placed This Encounter  Procedures   CBC with Differential    Standing Status:   Future    Standing Expiration Date:   08/14/2024   Comprehensive metabolic panel    Standing Status:   Future    Standing Expiration Date:   08/14/2024   CBC with Differential    Standing Status:   Future    Standing Expiration Date:   09/11/2024   Comprehensive metabolic panel    Standing Status:   Future    Standing Expiration Date:   09/11/2024   CBC with Differential    Standing Status:   Future    Standing Expiration Date:   10/09/2024   Comprehensive metabolic panel    Standing Status:   Future    Standing Expiration Date:   10/09/2024   CBC with Differential    Standing Status:   Future    Standing Expiration Date:   11/06/2024   Comprehensive metabolic panel    Standing Status:   Future    Standing Expiration Date:   11/06/2024      Mikeal Hawthorne R Teague,acting as a scribe for Doreatha Massed, MD.,have documented all relevant documentation on the behalf of Doreatha Massed, MD,as directed by  Doreatha Massed, MD while in the presence of Doreatha Massed, MD.  I, Doreatha Massed MD, have reviewed the above documentation for accuracy and completeness, and I agree with the above.    Doreatha Massed, MD   12/5/20241:58 PM  CHIEF COMPLAINT:  Diagnosis: multiple myeloma    Cancer Staging  No matching staging information was found for the patient.    Prior Therapy: Dara CyBorD   Current Therapy:  Maintenance Revlimid and daratumumab    HISTORY OF PRESENT ILLNESS:   Oncology History  Multiple myeloma not having achieved remission (HCC)  11/01/2020 Initial Diagnosis   Multiple myeloma not having achieved remission (HCC)   11/02/2020 - 11/02/2020 Chemotherapy         11/16/2020 -  Chemotherapy   Patient is on Treatment Plan : MYELOMA NEWLY DIAGNOSED TRANSPLANT  CANDIDATE DaraVRd (Daratumumab SQ) q21d x 6 Cycles (Induction/Consolidation)     11/21/2020 - 02/27/2022 Chemotherapy   Patient is on Treatment Plan : MYELOMA NEWLY DIAGNOSED TRANSPLANT CANDIDATE DaraVRd (Daratumumab SQ) q21d x 6 Cycles (Induction/Consolidation)     04/24/2021 - 04/24/2021 Chemotherapy   Patient is on Treatment Plan : MYELOMA NEWLY DIAGNOSED TRANSPLANT CANDIDATE Daratumumab SQ + Lenalidomide q28d (Maintenance)        INTERVAL HISTORY:   Paul Norton is a 67 y.o. male presenting to clinic today for follow up of multiple myeloma. He was last seen by me on 03/27/23.  Today, he states that he is doing well overall. His appetite level is at 80%. His energy level is at 80%. He states he has been exercising more frequently since his last visit. He denies any side effects from treatment. He denies any infections in the last 3 months, tingling and numbness in the hands or feet, or ankle swellings. He is taking Revlimid as prescribed.     PAST MEDICAL HISTORY:   Past Medical History: Past Medical History:  Diagnosis Date   Broken leg    Colonoscopy refused 09/2017   Hyperlipidemia    Hypertension    Multiple myeloma (HCC)    Pneumonia    Refuses treatment 09/2017   refuses screenings such as cancer screening, refuses vaccines, refuses normal preventative care   Vaccine refused by patient    all vaccines as of 09/2017    Surgical History: Past Surgical History:  Procedure Laterality Date   COLONOSCOPY     never, declines as of 09/2017    Social History: Social History   Socioeconomic History   Marital status: Divorced    Spouse name: Not on file   Number of children: 1   Years of education: Not on file   Highest education level: Not on file  Occupational History   Occupation: Disability  Tobacco Use   Smoking status: Never   Smokeless tobacco: Never  Substance and Sexual Activity   Alcohol use: No   Drug use: Never   Sexual activity: Not Currently  Other Topics  Concern   Not on file  Social History Narrative   Not on file   Social Determinants of Health   Financial Resource Strain: Medium Risk (11/15/2020)   Overall Financial Resource Strain (CARDIA)    Difficulty of Paying Living Expenses: Somewhat hard  Food Insecurity: No Food Insecurity (11/15/2020)   Hunger Vital Sign    Worried About Running Out of Food in the Last Year: Never true    Ran Out of Food in the Last Year: Never true  Transportation Needs: No Transportation Needs (11/15/2020)   PRAPARE - Administrator, Civil Service (Medical): No    Lack of Transportation (Non-Medical): No  Physical Activity: Insufficiently Active (11/21/2020)   Exercise Vital Sign    Days of Exercise per Week: 5 days    Minutes of Exercise per  Session: 20 min  Stress: No Stress Concern Present (11/21/2020)   [employer] of Occupational Health - Occupational Stress Questionnaire    Feeling of Stress : Not at all  Social Connections: Socially Isolated (11/21/2020)   Social Connection and Isolation Panel [NHANES]    Frequency of Communication with Friends and Family: More than three times a week    Frequency of Social Gatherings with Friends and Family: More than three times a week    Attends Religious Services: Never    Database administrator or Organizations: No    Attends Banker Meetings: Never    Marital Status: Divorced  Catering manager Violence: Not At Risk (11/21/2020)   Humiliation, Afraid, Rape, and Kick questionnaire    Fear of Current or Ex-Partner: No    Emotionally Abused: No    Physically Abused: No    Sexually Abused: No    Family History: No family history on file.  Current Medications:  Current Outpatient Medications:    acetaminophen (TYLENOL) 325 MG tablet, Take 2 tablets (650 mg total) by mouth every 6 (six) hours as needed for mild pain (or Fever >/= 101)., Disp: 30 tablet, Rfl: 30   acyclovir (ZOVIRAX) 400 MG tablet, Take 1 tablet (400 mg total) by  mouth 2 (two) times daily., Disp: 60 tablet, Rfl: 6   amLODipine (NORVASC) 10 MG tablet, TAKE 1 TABLET BY MOUTH DAILY, Disp: 90 tablet, Rfl: 3   aspirin 81 MG chewable tablet, Chew by mouth daily., Disp: , Rfl:    feeding supplement (ENSURE ENLIVE / ENSURE PLUS) LIQD, Take 237 mLs by mouth 3 (three) times daily between meals., Disp: 237 mL, Rfl: 12   lenalidomide (REVLIMID) 10 MG capsule, Take 1 capsule (10 mg total) by mouth daily. 21 days on, 7 days off, Disp: 21 capsule, Rfl: 0   OYSCO 500 + D 500-5 MG-MCG TABS, TAKE ONE TABLET BY MOUTH DAILY, Disp: 30 tablet, Rfl: 3 No current facility-administered medications for this visit.  Facility-Administered Medications Ordered in Other Visits:    daratumumab-hyaluronidase-fihj (DARZALEX FASPRO) 1800-30000 MG-UT/15ML chemo SQ injection 1,800 mg, 1,800 mg, Subcutaneous, Once, Doreatha Massed, MD   Allergies: No Known Allergies  REVIEW OF SYSTEMS:   Review of Systems  Constitutional:  Negative for chills, fatigue and fever.  HENT:   Negative for lump/mass, mouth sores, nosebleeds, sore throat and trouble swallowing.   Eyes:  Negative for eye problems.  Respiratory:  Negative for cough and shortness of breath.   Cardiovascular:  Negative for chest pain, leg swelling and palpitations.  Gastrointestinal:  Negative for abdominal pain, constipation, diarrhea, nausea and vomiting.  Genitourinary:  Negative for bladder incontinence, difficulty urinating, dysuria, frequency, hematuria and nocturia.   Musculoskeletal:  Negative for arthralgias, back pain, flank pain, myalgias and neck pain.  Skin:  Negative for itching and rash.  Neurological:  Negative for dizziness, headaches and numbness.  Hematological:  Does not bruise/bleed easily.  Psychiatric/Behavioral:  Negative for depression, sleep disturbance and suicidal ideas. The patient is not nervous/anxious.   All other systems reviewed and are negative.    VITALS:   Blood pressure (!)  148/80, pulse 93, temperature 97.8 F (36.6 C), temperature source Oral, resp. rate 18, weight 193 lb 9.6 oz (87.8 kg), SpO2 99%.  Wt Readings from Last 3 Encounters:  06/20/23 193 lb 9.6 oz (87.8 kg)  05/23/23 191 lb 3.2 oz (86.7 kg)  04/25/23 194 lb (88 kg)    Body mass index is 27.39 kg/m.  Performance status (ECOG): 1 - Symptomatic but completely ambulatory  PHYSICAL EXAM:   Physical Exam Vitals and nursing note reviewed. Exam conducted with a chaperone present.  Constitutional:      Appearance: Normal appearance.  Cardiovascular:     Rate and Rhythm: Normal rate and regular rhythm.     Pulses: Normal pulses.     Heart sounds: Normal heart sounds.  Pulmonary:     Effort: Pulmonary effort is normal.     Breath sounds: Normal breath sounds.  Abdominal:     Palpations: Abdomen is soft. There is no hepatomegaly, splenomegaly or mass.     Tenderness: There is no abdominal tenderness.  Musculoskeletal:     Right lower leg: No edema.     Left lower leg: No edema.  Lymphadenopathy:     Cervical: No cervical adenopathy.     Right cervical: No superficial, deep or posterior cervical adenopathy.    Left cervical: No superficial, deep or posterior cervical adenopathy.     Upper Body:     Right upper body: No supraclavicular or axillary adenopathy.     Left upper body: No supraclavicular or axillary adenopathy.  Neurological:     General: No focal deficit present.     Mental Status: He is alert and oriented to person, place, and time.  Psychiatric:        Mood and Affect: Mood normal.        Behavior: Behavior normal.     LABS:      Latest Ref Rng & Units 06/12/2023   11:10 AM 05/23/2023    1:13 PM 04/25/2023    1:00 PM  CBC  WBC 4.0 - 10.5 K/uL 7.6  8.4  7.6   Hemoglobin 13.0 - 17.0 g/dL 01.0  27.2  53.6   Hematocrit 39.0 - 52.0 % 45.4  43.4  44.2   Platelets 150 - 400 K/uL 213  160  165       Latest Ref Rng & Units 06/12/2023   11:10 AM 05/23/2023    1:13 PM  04/25/2023    1:00 PM  CMP  Glucose 70 - 99 mg/dL 644  034  742   BUN 8 - 23 mg/dL 21  27  25    Creatinine 0.61 - 1.24 mg/dL 5.95  6.38  7.56   Sodium 135 - 145 mmol/L 137  138  136   Potassium 3.5 - 5.1 mmol/L 4.1  3.8  3.5   Chloride 98 - 111 mmol/L 101  101  100   CO2 22 - 32 mmol/L 26  26  28    Calcium 8.9 - 10.3 mg/dL 8.8  9.0  8.5   Total Protein 6.5 - 8.1 g/dL 6.4  6.4  6.5   Total Bilirubin <1.2 mg/dL 1.3  1.6  2.0   Alkaline Phos 38 - 126 U/L 62  66  65   AST 15 - 41 U/L 16  17  20    ALT 0 - 44 U/L 16  18  20       Lab Results  Component Value Date   CEA1 1.1 10/30/2020   /  CEA  Date Value Ref Range Status  10/30/2020 1.1 0.0 - 4.7 ng/mL Final    Comment:    (NOTE)                             Nonsmokers          <3.9  Smokers             <5.6 Roche Diagnostics Electrochemiluminescence Immunoassay (ECLIA) Values obtained with different assay methods or kits cannot be used interchangeably.  Results cannot be interpreted as absolute evidence of the presence or absence of malignant disease. Performed At: [facility] [address] [place], [place] [id number] Jolene Schimke MD MV:[id number]    No results found for: "PSA1" No results found for: "CAN199" No results found for: "CAN125"  Lab Results  Component Value Date   TOTALPROTELP 5.6 (L) 02/27/2023   TOTALPROTELP 5.9 (L) 02/27/2023   ALBUMINELP 3.3 02/27/2023   A1GS 0.3 02/27/2023   A2GS 0.8 02/27/2023   BETS 0.8 02/27/2023   GAMS 0.2 (L) 02/27/2023   MSPIKE Not Observed 02/27/2023   SPEI Comment 02/27/2023   Lab Results  Component Value Date   TIBC 242 (L) 10/30/2020   TIBC 266 10/29/2020   FERRITIN 1,406 (H) 10/30/2020   FERRITIN 627 (H) 10/29/2020   IRONPCTSAT 42 (H) 10/30/2020   IRONPCTSAT 47 (H) 10/29/2020   Lab Results  Component Value Date   LDH 113 01/30/2022   LDH 107 01/02/2022   LDH 135 06/19/2021     STUDIES:   No results found.

## 2023-06-20 ENCOUNTER — Inpatient Hospital Stay: Payer: 59 | Attending: Hematology | Admitting: Hematology

## 2023-06-20 ENCOUNTER — Inpatient Hospital Stay: Payer: 59

## 2023-06-20 VITALS — BP 148/80 | HR 93 | Temp 97.8°F | Resp 18 | Wt 193.6 lb

## 2023-06-20 DIAGNOSIS — Z79899 Other long term (current) drug therapy: Secondary | ICD-10-CM | POA: Diagnosis not present

## 2023-06-20 DIAGNOSIS — C9000 Multiple myeloma not having achieved remission: Secondary | ICD-10-CM | POA: Insufficient documentation

## 2023-06-20 DIAGNOSIS — Z5112 Encounter for antineoplastic immunotherapy: Secondary | ICD-10-CM | POA: Insufficient documentation

## 2023-06-20 LAB — PROTEIN ELECTROPHORESIS, SERUM
A/G Ratio: 1.8 — ABNORMAL HIGH (ref 0.7–1.7)
Albumin ELP: 3.9 g/dL (ref 2.9–4.4)
Alpha-1-Globulin: 0.2 g/dL (ref 0.0–0.4)
Alpha-2-Globulin: 0.7 g/dL (ref 0.4–1.0)
Beta Globulin: 0.9 g/dL (ref 0.7–1.3)
Gamma Globulin: 0.3 g/dL — ABNORMAL LOW (ref 0.4–1.8)
Globulin, Total: 2.2 g/dL (ref 2.2–3.9)
Total Protein ELP: 6.1 g/dL (ref 6.0–8.5)

## 2023-06-20 MED ORDER — CETIRIZINE HCL 10 MG PO TABS
10.0000 mg | ORAL_TABLET | Freq: Once | ORAL | Status: AC
Start: 2023-06-20 — End: 2023-06-20
  Administered 2023-06-20: 10 mg via ORAL
  Filled 2023-06-20: qty 1

## 2023-06-20 MED ORDER — DEXAMETHASONE 4 MG PO TABS
40.0000 mg | ORAL_TABLET | Freq: Once | ORAL | Status: AC
  Administered 2023-06-20: 40 mg via ORAL
  Filled 2023-06-20: qty 10

## 2023-06-20 MED ORDER — DENOSUMAB 120 MG/1.7ML ~~LOC~~ SOLN
120.0000 mg | Freq: Once | SUBCUTANEOUS | Status: AC
  Administered 2023-06-20: 120 mg via SUBCUTANEOUS
  Filled 2023-06-20: qty 1.7

## 2023-06-20 MED ORDER — DARATUMUMAB-HYALURONIDASE-FIHJ 1800-30000 MG-UT/15ML ~~LOC~~ SOLN
1800.0000 mg | Freq: Once | SUBCUTANEOUS | Status: AC
  Administered 2023-06-20: 1800 mg via SUBCUTANEOUS
  Filled 2023-06-20: qty 15

## 2023-06-20 MED ORDER — ACETAMINOPHEN 325 MG PO TABS
650.0000 mg | ORAL_TABLET | Freq: Once | ORAL | Status: AC
  Administered 2023-06-20: 650 mg via ORAL
  Filled 2023-06-20: qty 2

## 2023-06-20 NOTE — Patient Instructions (Signed)

## 2023-06-20 NOTE — Progress Notes (Signed)
Patient taking calcium as directed. Denied tooth, jaw, and leg pain. No recent or upcoming dental visits. Labs reviewed. Patient tolerated injection with no complaints voiced. See MAR for details. Patient stable during and after injection. Site clean and dry with no bruising or swelling noted. Band aid applied.  Patient tolerated Daratumumab injection with no complaints voiced. See MAR for details. Lab reviewed. Injection site clean and dry with no bruising or swelling noted at site. Band aid applied. Vss with discharge and left in satisfactory condition with nos/s of distress noted.

## 2023-06-20 NOTE — Patient Instructions (Signed)
[facility] [place] - [facility]  Discharge Instructions: Thank you for choosing [facility] to provide your oncology and hematology care.  If you have a lab appointment with the Cancer Center - please note that after April 8th, 2024, all labs will be drawn in the cancer center.  You do not have to check in or register with the main entrance as you have in the past but will complete your check-in in the cancer center.  Wear comfortable clothing and clothing appropriate for easy access to any Portacath or PICC line.   We strive to give you quality time with your provider. You may need to reschedule your appointment if you arrive late (15 or more minutes).  Arriving late affects you and other patients whose appointments are after yours.  Also, if you miss three or more appointments without notifying the office, you may be dismissed from the clinic at the provider's discretion.      For prescription refill requests, have your pharmacy contact our office and allow 72 hours for refills to be completed.    Today you received the following chemotherapy and/or immunotherapy agents Daratumumab and xgeva, return as scheduled.   To help prevent nausea and vomiting after your treatment, we encourage you to take your nausea medication as directed.  BELOW ARE SYMPTOMS THAT SHOULD BE REPORTED IMMEDIATELY: *FEVER GREATER THAN 100.4 F (38 C) OR HIGHER *CHILLS OR SWEATING *NAUSEA AND VOMITING THAT IS NOT CONTROLLED WITH YOUR NAUSEA MEDICATION *UNUSUAL SHORTNESS OF BREATH *UNUSUAL BRUISING OR BLEEDING *URINARY PROBLEMS (pain or burning when urinating, or frequent urination) *BOWEL PROBLEMS (unusual diarrhea, constipation, pain near the anus) TENDERNESS IN MOUTH AND THROAT WITH OR WITHOUT PRESENCE OF ULCERS (sore throat, sores in mouth, or a toothache) UNUSUAL RASH, SWELLING OR PAIN  UNUSUAL VAGINAL DISCHARGE OR ITCHING   Items with * indicate a potential  emergency and should be followed up as soon as possible or go to the Emergency Department if any problems should occur.  Please show the CHEMOTHERAPY ALERT CARD or IMMUNOTHERAPY ALERT CARD at check-in to the Emergency Department and triage nurse.  Should you have questions after your visit or need to cancel or reschedule your appointment, please contact [facility] [place] - A DEPT OF [facility] [phone]  and follow the prompts.  Office hours are 8:00 a.m. to 4:30 p.m. Monday - Friday. Please note that voicemails left after 4:00 p.m. may not be returned until the following business day.  We are closed weekends and major holidays. You have access to a nurse at all times for urgent questions. Please call the main number to the clinic [phone] and follow the prompts.  For any non-urgent questions, you may also contact your provider using MyChart. We now offer e-Visits for anyone 45 and older to request care online for non-urgent symptoms. For details visit [email or web address].   Also download the MyChart app! Go to the app store, search "MyChart", open the app, select [place], and log in with your MyChart username and password.

## 2023-06-20 NOTE — Progress Notes (Signed)
Patient is taking Revlimid as prescribed.  He has not missed any doses and reports no side effects at this time.    Patient has been examined by Dr. Katragadda. Vital signs and labs have been reviewed by MD - ANC, Creatinine, LFTs, hemoglobin, and platelets are within treatment parameters per M.D. - pt may proceed with treatment.  Primary RN and pharmacy notified.  

## 2023-06-21 ENCOUNTER — Other Ambulatory Visit: Payer: Self-pay

## 2023-06-25 LAB — IMMUNOFIXATION ELECTROPHORESIS
IgA: 23 mg/dL — ABNORMAL LOW (ref 61–437)
IgG (Immunoglobin G), Serum: 359 mg/dL — ABNORMAL LOW (ref 603–1613)
IgM (Immunoglobulin M), Srm: 5 mg/dL — ABNORMAL LOW (ref 20–172)
Total Protein ELP: 6.2 g/dL (ref 6.0–8.5)

## 2023-06-26 ENCOUNTER — Other Ambulatory Visit: Payer: Self-pay

## 2023-06-26 MED ORDER — LENALIDOMIDE 10 MG PO CAPS: 10.0000 mg | ORAL_CAPSULE | Freq: Every day | ORAL | 0 refills | Status: DC

## 2023-06-26 NOTE — Telephone Encounter (Signed)
Chart reviewed. Revlimid refilled per last office note with Dr. Katragadda.  

## 2023-07-01 ENCOUNTER — Other Ambulatory Visit: Payer: Self-pay | Admitting: *Deleted

## 2023-07-16 ENCOUNTER — Other Ambulatory Visit: Payer: Self-pay

## 2023-07-16 DIAGNOSIS — C9000 Multiple myeloma not having achieved remission: Secondary | ICD-10-CM

## 2023-07-18 ENCOUNTER — Inpatient Hospital Stay: Payer: 59 | Attending: Hematology

## 2023-07-18 ENCOUNTER — Inpatient Hospital Stay: Payer: 59

## 2023-07-18 ENCOUNTER — Inpatient Hospital Stay: Payer: 59 | Admitting: Hematology

## 2023-07-18 VITALS — BP 160/66 | HR 65 | Temp 97.0°F | Wt 192.4 lb

## 2023-07-18 DIAGNOSIS — Z79899 Other long term (current) drug therapy: Secondary | ICD-10-CM | POA: Insufficient documentation

## 2023-07-18 DIAGNOSIS — C9000 Multiple myeloma not having achieved remission: Secondary | ICD-10-CM

## 2023-07-18 DIAGNOSIS — Z5112 Encounter for antineoplastic immunotherapy: Secondary | ICD-10-CM | POA: Insufficient documentation

## 2023-07-18 LAB — CBC WITH DIFFERENTIAL/PLATELET
Abs Immature Granulocytes: 0.02 10*3/uL (ref 0.00–0.07)
Basophils Absolute: 0.1 10*3/uL (ref 0.0–0.1)
Basophils Relative: 1 %
Eosinophils Absolute: 0.1 10*3/uL (ref 0.0–0.5)
Eosinophils Relative: 1 %
HCT: 45.4 % (ref 39.0–52.0)
Hemoglobin: 14.9 g/dL (ref 13.0–17.0)
Immature Granulocytes: 0 %
Lymphocytes Relative: 17 %
Lymphs Abs: 1.2 10*3/uL (ref 0.7–4.0)
MCH: 27.2 pg (ref 26.0–34.0)
MCHC: 32.8 g/dL (ref 30.0–36.0)
MCV: 82.8 fL (ref 80.0–100.0)
Monocytes Absolute: 0.8 10*3/uL (ref 0.1–1.0)
Monocytes Relative: 12 %
Neutro Abs: 4.8 10*3/uL (ref 1.7–7.7)
Neutrophils Relative %: 69 %
Platelets: 167 10*3/uL (ref 150–400)
RBC: 5.48 MIL/uL (ref 4.22–5.81)
RDW: 15.5 % (ref 11.5–15.5)
WBC: 6.9 10*3/uL (ref 4.0–10.5)
nRBC: 0 % (ref 0.0–0.2)

## 2023-07-18 LAB — COMPREHENSIVE METABOLIC PANEL
ALT: 18 U/L (ref 0–44)
AST: 19 U/L (ref 15–41)
Albumin: 4.2 g/dL (ref 3.5–5.0)
Alkaline Phosphatase: 67 U/L (ref 38–126)
Anion gap: 11 (ref 5–15)
BUN: 25 mg/dL — ABNORMAL HIGH (ref 8–23)
CO2: 28 mmol/L (ref 22–32)
Calcium: 8.6 mg/dL — ABNORMAL LOW (ref 8.9–10.3)
Chloride: 100 mmol/L (ref 98–111)
Creatinine, Ser: 1.08 mg/dL (ref 0.61–1.24)
GFR, Estimated: >60 mL/min (ref >60)
Glucose, Bld: 134 mg/dL — ABNORMAL HIGH (ref 70–99)
Potassium: 3.8 mmol/L (ref 3.5–5.1)
Sodium: 139 mmol/L (ref 135–145)
Total Bilirubin: 1.9 mg/dL — ABNORMAL HIGH (ref 0.0–1.2)
Total Protein: 6.8 g/dL (ref 6.5–8.1)

## 2023-07-18 LAB — MAGNESIUM: Magnesium: 2.3 mg/dL (ref 1.7–2.4)

## 2023-07-18 MED ORDER — DARATUMUMAB-HYALURONIDASE-FIHJ 1800-30000 MG-UT/15ML ~~LOC~~ SOLN
1800.0000 mg | Freq: Once | SUBCUTANEOUS | Status: AC
  Administered 2023-07-18: 1800 mg via SUBCUTANEOUS
  Filled 2023-07-18: qty 15

## 2023-07-18 MED ORDER — ACETAMINOPHEN 325 MG PO TABS
650.0000 mg | ORAL_TABLET | Freq: Once | ORAL | Status: AC
  Administered 2023-07-18: 650 mg via ORAL
  Filled 2023-07-18: qty 2

## 2023-07-18 MED ORDER — CETIRIZINE HCL 10 MG PO TABS
10.0000 mg | ORAL_TABLET | Freq: Once | ORAL | Status: AC
  Administered 2023-07-18: 10 mg via ORAL
  Filled 2023-07-18: qty 1

## 2023-07-18 MED ORDER — DENOSUMAB 120 MG/1.7ML ~~LOC~~ SOLN
120.0000 mg | Freq: Once | SUBCUTANEOUS | Status: AC
  Administered 2023-07-18: 120 mg via SUBCUTANEOUS
  Filled 2023-07-18: qty 1.7

## 2023-07-18 MED ORDER — DEXAMETHASONE 4 MG PO TABS
40.0000 mg | ORAL_TABLET | Freq: Once | ORAL | Status: AC
  Administered 2023-07-18: 40 mg via ORAL

## 2023-07-18 NOTE — Patient Instructions (Signed)
 [facility] [place] [facility]  Discharge Instructions: Thank you for choosing [facility] to provide your oncology and hematology care.  If you have a lab appointment with the Cancer Center - please note that after April 8th, 2024, all labs will be drawn in the cancer center.  You do not have to check in or register with the main entrance as you have in the past but will complete your check-in in the cancer center.  Wear comfortable clothing and clothing appropriate for easy access to any Portacath or PICC line.   We strive to give you quality time with your provider. You may need to reschedule your appointment if you arrive late (15 or more minutes).  Arriving late affects you and other patients whose appointments are after yours.  Also, if you miss three or more appointments without notifying the office, you may be dismissed from the clinic at the provider's discretion.      For prescription refill requests, have your pharmacy contact our office and allow 72 hours for refills to be completed.    Today you received the following chemotherapy and/or immunotherapy agents darzalex    To help prevent nausea and vomiting after your treatment, we encourage you to take your nausea medication as directed.  BELOW ARE SYMPTOMS THAT SHOULD BE REPORTED IMMEDIATELY: *FEVER GREATER THAN 100.4 F (38 C) OR HIGHER *CHILLS OR SWEATING *NAUSEA AND VOMITING THAT IS NOT CONTROLLED WITH YOUR NAUSEA MEDICATION *UNUSUAL SHORTNESS OF BREATH *UNUSUAL BRUISING OR BLEEDING *URINARY PROBLEMS (pain or burning when urinating, or frequent urination) *BOWEL PROBLEMS (unusual diarrhea, constipation, pain near the anus) TENDERNESS IN MOUTH AND THROAT WITH OR WITHOUT PRESENCE OF ULCERS (sore throat, sores in mouth, or a toothache) UNUSUAL RASH, SWELLING OR PAIN  UNUSUAL VAGINAL DISCHARGE OR ITCHING   Items with * indicate a potential emergency and should be followed up as  soon as possible or go to the Emergency Department if any problems should occur.  Please show the CHEMOTHERAPY ALERT CARD or IMMUNOTHERAPY ALERT CARD at check-in to the Emergency Department and triage nurse.  Should you have questions after your visit or need to cancel or reschedule your appointment, please contact [facility] [place] - A DEPT OF [facility] [phone]  and follow the prompts.  Office hours are 8:00 a.m. to 4:30 p.m. Monday - Friday. Please note that voicemails left after 4:00 p.m. may not be returned until the following business day.  We are closed weekends and major holidays. You have access to a nurse at all times for urgent questions. Please call the main number to the clinic [phone] and follow the prompts.  For any non-urgent questions, you may also contact your provider using MyChart. We now offer e-Visits for anyone 2 and older to request care online for non-urgent symptoms. For details visit [email or web address].   Also download the MyChart app! Go to the app store, search "MyChart", open the app, select [place], and log in with your MyChart username and password.

## 2023-07-18 NOTE — Progress Notes (Signed)
 Patient tolerated  Darzalex injection with no complaints voiced.  Site clean and dry with no bruising or swelling noted at site.  See MAR for details.  Band aid applied.  Patient stable during and after injection.  Vss with discharge and left in satisfactory condition with no s/s of distress noted. All follow ups as scheduled.   Miyuki Rzasa [employer]

## 2023-07-23 ENCOUNTER — Other Ambulatory Visit: Payer: Self-pay

## 2023-07-23 MED ORDER — LENALIDOMIDE 10 MG PO CAPS: 10.0000 mg | ORAL_CAPSULE | Freq: Every day | ORAL | 0 refills | Status: DC

## 2023-07-23 NOTE — Telephone Encounter (Signed)
 Chart reviewed. Revlimid refilled per last office note with Dr. Ellin Saba.

## 2023-07-29 ENCOUNTER — Other Ambulatory Visit: Payer: Self-pay

## 2023-08-08 ENCOUNTER — Encounter (HOSPITAL_COMMUNITY): Payer: Self-pay | Admitting: Hematology

## 2023-08-08 ENCOUNTER — Encounter: Payer: Self-pay | Admitting: Hematology

## 2023-08-12 ENCOUNTER — Telehealth: Payer: Self-pay

## 2023-08-12 ENCOUNTER — Other Ambulatory Visit (HOSPITAL_COMMUNITY): Payer: Self-pay

## 2023-08-12 ENCOUNTER — Encounter: Payer: Self-pay | Admitting: Hematology

## 2023-08-12 ENCOUNTER — Encounter (HOSPITAL_COMMUNITY): Payer: Self-pay | Admitting: Hematology

## 2023-08-12 NOTE — Telephone Encounter (Signed)
Oral Oncology Patient Advocate Encounter  Prior Authorization for Lenalidomide has been approved.    PA# [id number]  Effective dates: 07/17/23 through 07/15/24  Patient may continue to fill at [facility] as they have been.    Ardeen Fillers, CPhT Oncology Pharmacy Patient Advocate  [facility]  [phone] (phone) [phone] (fax) 08/12/2023 1:47 PM

## 2023-08-12 NOTE — Telephone Encounter (Signed)
Oral Oncology Patient Advocate Encounter  Change in insurance   Received notification that prior authorization for Lenalidomide is due for renewal.   PA submitted on 08/12/23  Key [id number]  Status is pending     Ardeen Fillers, CPhT Oncology Pharmacy Patient Advocate  [facility]  [phone] (phone) [phone] (fax) 08/12/2023 1:08 PM

## 2023-08-13 ENCOUNTER — Other Ambulatory Visit (HOSPITAL_COMMUNITY): Payer: Self-pay

## 2023-08-15 ENCOUNTER — Inpatient Hospital Stay: Payer: 59

## 2023-08-15 VITALS — BP 180/82 | HR 65 | Temp 96.5°F | Resp 18 | Wt 191.2 lb

## 2023-08-15 DIAGNOSIS — C9000 Multiple myeloma not having achieved remission: Secondary | ICD-10-CM | POA: Diagnosis not present

## 2023-08-15 DIAGNOSIS — Z79899 Other long term (current) drug therapy: Secondary | ICD-10-CM | POA: Diagnosis not present

## 2023-08-15 DIAGNOSIS — Z5112 Encounter for antineoplastic immunotherapy: Secondary | ICD-10-CM | POA: Diagnosis not present

## 2023-08-15 LAB — CBC WITH DIFFERENTIAL/PLATELET
Abs Immature Granulocytes: 0.02 10*3/uL (ref 0.00–0.07)
Basophils Absolute: 0.1 10*3/uL (ref 0.0–0.1)
Basophils Relative: 1 %
Eosinophils Absolute: 0.1 10*3/uL (ref 0.0–0.5)
Eosinophils Relative: 2 %
HCT: 44.7 % (ref 39.0–52.0)
Hemoglobin: 14.8 g/dL (ref 13.0–17.0)
Immature Granulocytes: 0 %
Lymphocytes Relative: 15 %
Lymphs Abs: 1.1 10*3/uL (ref 0.7–4.0)
MCH: 27.5 pg (ref 26.0–34.0)
MCHC: 33.1 g/dL (ref 30.0–36.0)
MCV: 83.1 fL (ref 80.0–100.0)
Monocytes Absolute: 0.8 10*3/uL (ref 0.1–1.0)
Monocytes Relative: 10 %
Neutro Abs: 5.5 10*3/uL (ref 1.7–7.7)
Neutrophils Relative %: 72 %
Platelets: 171 10*3/uL (ref 150–400)
RBC: 5.38 MIL/uL (ref 4.22–5.81)
RDW: 15.6 % — ABNORMAL HIGH (ref 11.5–15.5)
WBC: 7.6 10*3/uL (ref 4.0–10.5)
nRBC: 0 % (ref 0.0–0.2)

## 2023-08-15 LAB — COMPREHENSIVE METABOLIC PANEL
ALT: 16 U/L (ref 0–44)
AST: 17 U/L (ref 15–41)
Albumin: 4.2 g/dL (ref 3.5–5.0)
Alkaline Phosphatase: 65 U/L (ref 38–126)
Anion gap: 10 (ref 5–15)
BUN: 30 mg/dL — ABNORMAL HIGH (ref 8–23)
CO2: 27 mmol/L (ref 22–32)
Calcium: 8.8 mg/dL — ABNORMAL LOW (ref 8.9–10.3)
Chloride: 101 mmol/L (ref 98–111)
Creatinine, Ser: 1.13 mg/dL (ref 0.61–1.24)
GFR, Estimated: >60 mL/min (ref >60)
Glucose, Bld: 125 mg/dL — ABNORMAL HIGH (ref 70–99)
Potassium: 4.1 mmol/L (ref 3.5–5.1)
Sodium: 138 mmol/L (ref 135–145)
Total Bilirubin: 1.3 mg/dL — ABNORMAL HIGH (ref 0.0–1.2)
Total Protein: 6.5 g/dL (ref 6.5–8.1)

## 2023-08-15 MED ORDER — DARATUMUMAB-HYALURONIDASE-FIHJ 1800-30000 MG-UT/15ML ~~LOC~~ SOLN
1800.0000 mg | Freq: Once | SUBCUTANEOUS | Status: AC
  Administered 2023-08-15: 1800 mg via SUBCUTANEOUS
  Filled 2023-08-15: qty 15

## 2023-08-15 MED ORDER — ACETAMINOPHEN 325 MG PO TABS
650.0000 mg | ORAL_TABLET | Freq: Once | ORAL | Status: AC
  Administered 2023-08-15: 650 mg via ORAL
  Filled 2023-08-15: qty 2

## 2023-08-15 MED ORDER — DENOSUMAB 120 MG/1.7ML ~~LOC~~ SOLN
120.0000 mg | Freq: Once | SUBCUTANEOUS | Status: AC
  Administered 2023-08-15: 120 mg via SUBCUTANEOUS
  Filled 2023-08-15: qty 1.7

## 2023-08-15 MED ORDER — DEXAMETHASONE 4 MG PO TABS
40.0000 mg | ORAL_TABLET | Freq: Once | ORAL | Status: AC
  Administered 2023-08-15: 40 mg via ORAL
  Filled 2023-08-15: qty 10

## 2023-08-15 MED ORDER — CETIRIZINE HCL 10 MG PO TABS
10.0000 mg | ORAL_TABLET | Freq: Once | ORAL | Status: AC
  Administered 2023-08-15: 10 mg via ORAL
  Filled 2023-08-15: qty 1

## 2023-08-15 NOTE — Patient Instructions (Signed)
[facility] [place] - A DEPT OF [facility]  Discharge Instructions: Thank you for choosing [facility] to provide your oncology and hematology care.  If you have a lab appointment with the Cancer Center - please note that after April 8th, 2024, all labs will be drawn in the cancer center.  You do not have to check in or register with the main entrance as you have in the past but will complete your check-in in the cancer center.  Wear comfortable clothing and clothing appropriate for easy access to any Portacath or PICC line.   We strive to give you quality time with your provider. You may need to reschedule your appointment if you arrive late (15 or more minutes).  Arriving late affects you and other patients whose appointments are after yours.  Also, if you miss three or more appointments without notifying the office, you may be dismissed from the clinic at the provider's discretion.      For prescription refill requests, have your pharmacy contact our office and allow 72 hours for refills to be completed.    Today you received the following chemotherapy and/or immunotherapy agents Xgeva and Daratumumab, return as scheduled.   To help prevent nausea and vomiting after your treatment, we encourage you to take your nausea medication as directed.  BELOW ARE SYMPTOMS THAT SHOULD BE REPORTED IMMEDIATELY: *FEVER GREATER THAN 100.4 F (38 C) OR HIGHER *CHILLS OR SWEATING *NAUSEA AND VOMITING THAT IS NOT CONTROLLED WITH YOUR NAUSEA MEDICATION *UNUSUAL SHORTNESS OF BREATH *UNUSUAL BRUISING OR BLEEDING *URINARY PROBLEMS (pain or burning when urinating, or frequent urination) *BOWEL PROBLEMS (unusual diarrhea, constipation, pain near the anus) TENDERNESS IN MOUTH AND THROAT WITH OR WITHOUT PRESENCE OF ULCERS (sore throat, sores in mouth, or a toothache) UNUSUAL RASH, SWELLING OR PAIN  UNUSUAL VAGINAL DISCHARGE OR ITCHING   Items with * indicate a potential  emergency and should be followed up as soon as possible or go to the Emergency Department if any problems should occur.  Please show the CHEMOTHERAPY ALERT CARD or IMMUNOTHERAPY ALERT CARD at check-in to the Emergency Department and triage nurse.  Should you have questions after your visit or need to cancel or reschedule your appointment, please contact [facility] [place] - A DEPT OF [facility] [phone]  and follow the prompts.  Office hours are 8:00 a.m. to 4:30 p.m. Monday - Friday. Please note that voicemails left after 4:00 p.m. may not be returned until the following business day.  We are closed weekends and major holidays. You have access to a nurse at all times for urgent questions. Please call the main number to the clinic [phone] and follow the prompts.  For any non-urgent questions, you may also contact your provider using MyChart. We now offer e-Visits for anyone 70 and older to request care online for non-urgent symptoms. For details visit [email or web address].   Also download the MyChart app! Go to the app store, search "MyChart", open the app, select [place], and log in with your MyChart username and password.

## 2023-08-15 NOTE — Progress Notes (Signed)
Patietn presents today for Xgeva and Dara BP 180/82, patient okay for treatment labs within treatment parameters. Patient taking calcium as directed. Denied tooth, jaw, and leg pain. No recent or upcoming dental visits. Labs reviewed. Patient tolerated injection with no complaints voiced. See MAR for details. Patient stable during and after injection. Site clean and dry with no bruising or swelling noted. Band aid applied.  Patient tolerated Daratumumab injection with no complaints voiced. See MAR for details. Lab reviewed. Injection site clean and dry with no bruising or swelling noted at site. Band aid applied. Vss with discharge and left in satisfactory condition with nos/s of distress noted.

## 2023-08-21 ENCOUNTER — Other Ambulatory Visit: Payer: Self-pay

## 2023-08-21 MED ORDER — LENALIDOMIDE 10 MG PO CAPS: 10.0000 mg | ORAL_CAPSULE | Freq: Every day | ORAL | 0 refills | Status: DC

## 2023-08-21 NOTE — Telephone Encounter (Signed)
 Chart reviewed. Revlimid refilled per last office note with Dr. Ellin Saba.

## 2023-08-27 ENCOUNTER — Other Ambulatory Visit: Payer: Self-pay

## 2023-08-29 ENCOUNTER — Encounter (HOSPITAL_COMMUNITY): Payer: Self-pay | Admitting: Hematology

## 2023-08-29 ENCOUNTER — Encounter: Payer: Self-pay | Admitting: Hematology

## 2023-09-04 ENCOUNTER — Other Ambulatory Visit: Payer: Self-pay

## 2023-09-04 DIAGNOSIS — C9000 Multiple myeloma not having achieved remission: Secondary | ICD-10-CM

## 2023-09-05 ENCOUNTER — Inpatient Hospital Stay: Payer: 59

## 2023-09-06 ENCOUNTER — Inpatient Hospital Stay: Payer: 59 | Attending: Hematology

## 2023-09-06 DIAGNOSIS — Z5112 Encounter for antineoplastic immunotherapy: Secondary | ICD-10-CM | POA: Insufficient documentation

## 2023-09-06 DIAGNOSIS — C9000 Multiple myeloma not having achieved remission: Secondary | ICD-10-CM | POA: Insufficient documentation

## 2023-09-06 DIAGNOSIS — Z79620 Long term (current) use of immunosuppressive biologic: Secondary | ICD-10-CM | POA: Diagnosis not present

## 2023-09-06 LAB — CBC WITH DIFFERENTIAL/PLATELET
Abs Immature Granulocytes: 0.02 10*3/uL (ref 0.00–0.07)
Basophils Absolute: 0.1 10*3/uL (ref 0.0–0.1)
Basophils Relative: 1 %
Eosinophils Absolute: 0.1 10*3/uL (ref 0.0–0.5)
Eosinophils Relative: 2 %
HCT: 43.8 % (ref 39.0–52.0)
Hemoglobin: 14.7 g/dL (ref 13.0–17.0)
Immature Granulocytes: 0 %
Lymphocytes Relative: 17 %
Lymphs Abs: 1.3 10*3/uL (ref 0.7–4.0)
MCH: 27.8 pg (ref 26.0–34.0)
MCHC: 33.6 g/dL (ref 30.0–36.0)
MCV: 82.8 fL (ref 80.0–100.0)
Monocytes Absolute: 1.6 10*3/uL — ABNORMAL HIGH (ref 0.1–1.0)
Monocytes Relative: 21 %
Neutro Abs: 4.7 10*3/uL (ref 1.7–7.7)
Neutrophils Relative %: 59 %
Platelets: 208 10*3/uL (ref 150–400)
RBC: 5.29 MIL/uL (ref 4.22–5.81)
RDW: 15.4 % (ref 11.5–15.5)
WBC: 7.9 10*3/uL (ref 4.0–10.5)
nRBC: 0 % (ref 0.0–0.2)

## 2023-09-06 LAB — COMPREHENSIVE METABOLIC PANEL
ALT: 16 U/L (ref 0–44)
AST: 18 U/L (ref 15–41)
Albumin: 4.2 g/dL (ref 3.5–5.0)
Alkaline Phosphatase: 62 U/L (ref 38–126)
Anion gap: 12 (ref 5–15)
BUN: 28 mg/dL — ABNORMAL HIGH (ref 8–23)
CO2: 27 mmol/L (ref 22–32)
Calcium: 9.1 mg/dL (ref 8.9–10.3)
Chloride: 100 mmol/L (ref 98–111)
Creatinine, Ser: 1.06 mg/dL (ref 0.61–1.24)
GFR, Estimated: >60 mL/min (ref >60)
Glucose, Bld: 119 mg/dL — ABNORMAL HIGH (ref 70–99)
Potassium: 3.9 mmol/L (ref 3.5–5.1)
Sodium: 139 mmol/L (ref 135–145)
Total Bilirubin: 1.5 mg/dL — ABNORMAL HIGH (ref 0.0–1.2)
Total Protein: 6.3 g/dL — ABNORMAL LOW (ref 6.5–8.1)

## 2023-09-06 LAB — MAGNESIUM: Magnesium: 2.1 mg/dL (ref 1.7–2.4)

## 2023-09-09 LAB — KAPPA/LAMBDA LIGHT CHAINS
Kappa free light chain: 7.7 mg/L (ref 3.3–19.4)
Kappa, lambda light chain ratio: 2.41 — ABNORMAL HIGH (ref 0.26–1.65)
Lambda free light chains: 3.2 mg/L — ABNORMAL LOW (ref 5.7–26.3)

## 2023-09-09 LAB — PROTEIN ELECTROPHORESIS, SERUM
A/G Ratio: 1.9 — ABNORMAL HIGH (ref 0.7–1.7)
Albumin ELP: 3.8 g/dL (ref 2.9–4.4)
Alpha-1-Globulin: 0.3 g/dL (ref 0.0–0.4)
Alpha-2-Globulin: 0.6 g/dL (ref 0.4–1.0)
Beta Globulin: 0.9 g/dL (ref 0.7–1.3)
Gamma Globulin: 0.3 g/dL — ABNORMAL LOW (ref 0.4–1.8)
Globulin, Total: 2 g/dL — ABNORMAL LOW (ref 2.2–3.9)
Total Protein ELP: 5.8 g/dL — ABNORMAL LOW (ref 6.0–8.5)

## 2023-09-09 LAB — IMMUNOFIXATION ELECTROPHORESIS
IgA: 17 mg/dL — ABNORMAL LOW (ref 61–437)
IgG (Immunoglobin G), Serum: 300 mg/dL — ABNORMAL LOW (ref 603–1613)
IgM (Immunoglobulin M), Srm: 6 mg/dL — ABNORMAL LOW (ref 20–172)
Total Protein ELP: 5.7 g/dL — ABNORMAL LOW (ref 6.0–8.5)

## 2023-09-12 ENCOUNTER — Inpatient Hospital Stay: Payer: 59

## 2023-09-12 ENCOUNTER — Inpatient Hospital Stay (HOSPITAL_BASED_OUTPATIENT_CLINIC_OR_DEPARTMENT_OTHER): Payer: 59 | Admitting: Hematology

## 2023-09-12 VITALS — BP 190/80 | HR 97 | Temp 96.7°F | Resp 18 | Wt 190.5 lb

## 2023-09-12 DIAGNOSIS — C9000 Multiple myeloma not having achieved remission: Secondary | ICD-10-CM

## 2023-09-12 DIAGNOSIS — Z5112 Encounter for antineoplastic immunotherapy: Secondary | ICD-10-CM | POA: Diagnosis not present

## 2023-09-12 DIAGNOSIS — Z79620 Long term (current) use of immunosuppressive biologic: Secondary | ICD-10-CM | POA: Diagnosis not present

## 2023-09-12 MED ORDER — DEXAMETHASONE 4 MG PO TABS
40.0000 mg | ORAL_TABLET | Freq: Once | ORAL | Status: AC
  Administered 2023-09-12: 40 mg via ORAL
  Filled 2023-09-12: qty 10

## 2023-09-12 MED ORDER — CETIRIZINE HCL 10 MG PO TABS
10.0000 mg | ORAL_TABLET | Freq: Once | ORAL | Status: AC
  Administered 2023-09-12: 10 mg via ORAL
  Filled 2023-09-12: qty 1

## 2023-09-12 MED ORDER — DENOSUMAB 120 MG/1.7ML ~~LOC~~ SOLN
120.0000 mg | Freq: Once | SUBCUTANEOUS | Status: AC
  Administered 2023-09-12: 120 mg via SUBCUTANEOUS
  Filled 2023-09-12: qty 1.7

## 2023-09-12 MED ORDER — DARATUMUMAB-HYALURONIDASE-FIHJ 1800-30000 MG-UT/15ML ~~LOC~~ SOLN
1800.0000 mg | Freq: Once | SUBCUTANEOUS | Status: AC
  Administered 2023-09-12: 1800 mg via SUBCUTANEOUS
  Filled 2023-09-12: qty 15

## 2023-09-12 MED ORDER — ACETAMINOPHEN 325 MG PO TABS
650.0000 mg | ORAL_TABLET | Freq: Once | ORAL | Status: AC
  Administered 2023-09-12: 650 mg via ORAL
  Filled 2023-09-12: qty 2

## 2023-09-12 NOTE — Progress Notes (Signed)
 Patient has been examined by Dr. Ellin Saba. Vital signs (BP 190/80) and labs have been reviewed by MD - ANC, Creatinine, LFTs, hemoglobin, and platelets are within treatment parameters per M.D. - pt may proceed with treatment.  Primary RN and pharmacy notified.

## 2023-09-12 NOTE — Progress Notes (Signed)
 Patient presents today for Daratumumab, Labs done 09/06/2023, Bilirubin 1.5 BP 190/80 patient assessed by Dr. Ellin Saba, patient okay for treatment today. Patient taking calcium as directed. Denied tooth, jaw, and leg pain. No recent or upcoming dental visits. Labs reviewed. Patient tolerated injection with no complaints voiced. See MAR for details. Patient stable during and after injection. Site clean and dry with no bruising or swelling noted. Band aid applied.  Patient tolerated Daratumumab injection with no complaints voiced. See MAR for details. Lab reviewed. Injection site clean and dry with no bruising or swelling noted at site. Band aid applied. Vss with discharge and left in satisfactory condition with nos/s of distress noted.

## 2023-09-12 NOTE — Progress Notes (Signed)
 [facility] [address], [place] [address]    Clinic Day:  09/12/2023  Referring physician: Doreatha Massed, MD  Patient Care Team: Doreatha Massed, MD as PCP - General (Hematology) Doreatha Massed, MD as Medical Oncologist (Oncology)   ASSESSMENT & PLAN:   Assessment: 1.  IgG kappa light chain multiple myeloma: - Admission with hypercalcemia and renal failure. - CT CAP showed extensive lytic foci throughout the axial and appendicular skeleton of the chest, abdomen and pelvis.  Splenomegaly with an enlarged lobular spleen, nonspecific. - Bone marrow biopsy on 11/01/2020 shows 85% atypical plasma cells in the aspirate. - FISH panel positive for 1 p-,-13,14q-,16q-,17p-/-17 and 20q- - Cytogenetics-no metaphases available for analysis. - SPEP with 5.4 g of M spike.  Free light chain ratio 1030.  LDH normal.  Beta-2 microglobulin 18.7.  24-hour urine total protein 3.6 g. - 6 cycles of Dara VRD from 11/21/2020 through 03/07/2021. - Patient declined bone marrow transplant. - Maintenance Revlimid and monthly daratumumab started on 03/27/2021.   2.  Social/family history: - He worked as a Naval architect.  He lives by himself. - Mother with breast and ovarian cancer.  Father had metastatic kidney cancer.  His brother's daughter has CML.    Plan: 1.  IgG kappa light chain multiple myeloma, high risk: - He is tolerating Revlimid and Darzalex very well. - Reviewed myeloma labs from 09/06/2023: M spike is not detected.  Immunofixation was negative.  FLC ratio has increased to 2.41 from 1.25 previously.  Kappa light chains are normal at 7.7.  Creatinine is 1.06 and CBC was grossly normal. - Continue monthly Darzalex.  Continue Revlimid 10 mg daily 21 days / 28 days.  RTC 12 weeks for follow-up with repeat myeloma labs.   2.  Myeloma bone disease: - Continue calcium and vitamin D supplements.  Denies any jaw pains or dental issues.  Calcium today is 9.1.   Continue monthly denosumab.  3.  ID prophylaxis: - Continue acyclovir twice daily and aspirin 81 mg daily.    Orders Placed This Encounter  Procedures   CBC with Differential    Standing Status:   Future    Expected Date:   11/06/2023    Expiration Date:   11/06/2024   Comprehensive metabolic panel    Standing Status:   Future    Expected Date:   11/06/2023    Expiration Date:   11/06/2024   CBC with Differential    Standing Status:   Future    Expected Date:   12/04/2023    Expiration Date:   12/04/2024   Comprehensive metabolic panel    Standing Status:   Future    Expected Date:   12/04/2023    Expiration Date:   12/04/2024   CBC with Differential    Standing Status:   Future    Expected Date:   01/01/2024    Expiration Date:   01/01/2025   Comprehensive metabolic panel    Standing Status:   Future    Expected Date:   01/01/2024    Expiration Date:   01/01/2025   CBC with Differential    Standing Status:   Future    Expected Date:   01/29/2024    Expiration Date:   01/29/2025   Comprehensive metabolic panel    Standing Status:   Future    Expected Date:   01/29/2024    Expiration Date:   01/29/2025   CBC with Differential    Standing  Status:   Future    Expected Date:   02/26/2024    Expiration Date:   02/26/2025   Comprehensive metabolic panel    Standing Status:   Future    Expected Date:   02/26/2024    Expiration Date:   02/26/2025      Paul Norton R Teague,acting as a scribe for Doreatha Massed, MD.,have documented all relevant documentation on the behalf of Doreatha Massed, MD,as directed by  Doreatha Massed, MD while in the presence of Doreatha Massed, MD.  I, Doreatha Massed MD, have reviewed the above documentation for accuracy and completeness, and I agree with the above.     Doreatha Massed, MD   2/27/20251:39 PM  CHIEF COMPLAINT:   Diagnosis: multiple myeloma    Cancer Staging  No matching staging information was found for the  patient.    Prior Therapy: Dara CyBorD   Current Therapy:  Maintenance Revlimid and daratumumab    HISTORY OF PRESENT ILLNESS:   Oncology History  Multiple myeloma not having achieved remission (HCC)  11/01/2020 Initial Diagnosis   Multiple myeloma not having achieved remission (HCC)   11/02/2020 - 11/02/2020 Chemotherapy         11/16/2020 -  Chemotherapy   Patient is on Treatment Plan : MYELOMA NEWLY DIAGNOSED TRANSPLANT CANDIDATE DaraVRd (Daratumumab SQ) q21d x 6 Cycles (Induction/Consolidation)     11/21/2020 - 02/27/2022 Chemotherapy   Patient is on Treatment Plan : MYELOMA NEWLY DIAGNOSED TRANSPLANT CANDIDATE DaraVRd (Daratumumab SQ) q21d x 6 Cycles (Induction/Consolidation)     04/24/2021 - 04/24/2021 Chemotherapy   Patient is on Treatment Plan : MYELOMA NEWLY DIAGNOSED TRANSPLANT CANDIDATE Daratumumab SQ + Lenalidomide q28d (Maintenance)        INTERVAL HISTORY:   Paul Norton is a 68 y.o. male presenting to clinic today for follow up of multiple myeloma. He was last seen by me on 06/20/23.  Today, he states that he is doing well overall. His appetite level is at 100%. His energy level is at 75%.  Paul Norton is taking Revlimid as prescribed. He denies any recent infections, peripheral neuropathy, dental issues, or jaw pains. He is taking acyclovir and aspirin 81 mg as prescribed.   PAST MEDICAL HISTORY:   Past Medical History: Past Medical History:  Diagnosis Date   Broken leg    Colonoscopy refused 09/2017   Hyperlipidemia    Hypertension    Multiple myeloma (HCC)    Pneumonia    Refuses treatment 09/2017   refuses screenings such as cancer screening, refuses vaccines, refuses normal preventative care   Vaccine refused by patient    all vaccines as of 09/2017    Surgical History: Past Surgical History:  Procedure Laterality Date   COLONOSCOPY     never, declines as of 09/2017    Social History: Social History   Socioeconomic History   Marital status: Divorced     Spouse name: Not on file   Number of children: 1   Years of education: Not on file   Highest education level: Not on file  Occupational History   Occupation: Disability  Tobacco Use   Smoking status: Never   Smokeless tobacco: Never  Substance and Sexual Activity   Alcohol use: No   Drug use: Never   Sexual activity: Not Currently  Other Topics Concern   Not on file  Social History Narrative   Not on file   Social Drivers of Health   Financial Resource Strain: Medium Risk (11/15/2020)   Overall Financial Resource Strain (  CARDIA)    Difficulty of Paying Living Expenses: Somewhat hard  Food Insecurity: No Food Insecurity (11/15/2020)   Hunger Vital Sign    Worried About Running Out of Food in the Last Year: Never true    Ran Out of Food in the Last Year: Never true  Transportation Needs: No Transportation Needs (11/15/2020)   PRAPARE - Administrator, Civil Service (Medical): No    Lack of Transportation (Non-Medical): No  Physical Activity: Insufficiently Active (11/21/2020)   Exercise Vital Sign    Days of Exercise per Week: 5 days    Minutes of Exercise per Session: 20 min  Stress: No Stress Concern Present (11/21/2020)   [employer] of Occupational Health - Occupational Stress Questionnaire    Feeling of Stress : Not at all  Social Connections: Socially Isolated (11/21/2020)   Social Connection and Isolation Panel [NHANES]    Frequency of Communication with Friends and Family: More than three times a week    Frequency of Social Gatherings with Friends and Family: More than three times a week    Attends Religious Services: Never    Database administrator or Organizations: No    Attends Banker Meetings: Never    Marital Status: Divorced  Catering manager Violence: Not At Risk (11/21/2020)   Humiliation, Afraid, Rape, and Kick questionnaire    Fear of Current or Ex-Partner: No    Emotionally Abused: No    Physically Abused: No    Sexually Abused: No     Family History: No family history on file.  Current Medications:  Current Outpatient Medications:    acetaminophen (TYLENOL) 325 MG tablet, Take 2 tablets (650 mg total) by mouth every 6 (six) hours as needed for mild pain (or Fever >/= 101)., Disp: 30 tablet, Rfl: 30   acyclovir (ZOVIRAX) 400 MG tablet, Take 1 tablet (400 mg total) by mouth 2 (two) times daily., Disp: 60 tablet, Rfl: 6   amLODipine (NORVASC) 10 MG tablet, TAKE 1 TABLET BY MOUTH DAILY, Disp: 90 tablet, Rfl: 3   aspirin 81 MG chewable tablet, Chew by mouth daily., Disp: , Rfl:    feeding supplement (ENSURE ENLIVE / ENSURE PLUS) LIQD, Take 237 mLs by mouth 3 (three) times daily between meals., Disp: 237 mL, Rfl: 12   lenalidomide (REVLIMID) 10 MG capsule, Take 1 capsule (10 mg total) by mouth daily. 21 days on, 7 days off, Disp: 21 capsule, Rfl: 0   OYSCO 500 + D 500-5 MG-MCG TABS, TAKE ONE TABLET BY MOUTH DAILY, Disp: 30 tablet, Rfl: 3   Allergies: No Known Allergies  REVIEW OF SYSTEMS:   Review of Systems  Constitutional:  Negative for chills, fatigue and fever.  HENT:   Negative for lump/mass, mouth sores, nosebleeds, sore throat and trouble swallowing.   Eyes:  Negative for eye problems.  Respiratory:  Negative for cough and shortness of breath.   Cardiovascular:  Negative for chest pain, leg swelling and palpitations.  Gastrointestinal:  Negative for abdominal pain, constipation, diarrhea, nausea and vomiting.  Genitourinary:  Negative for bladder incontinence, difficulty urinating, dysuria, frequency, hematuria and nocturia.   Musculoskeletal:  Negative for arthralgias, back pain, flank pain, myalgias and neck pain.  Skin:  Negative for itching and rash.  Neurological:  Negative for dizziness, headaches and numbness.  Hematological:  Does not bruise/bleed easily.  Psychiatric/Behavioral:  Negative for depression, sleep disturbance and suicidal ideas. The patient is not nervous/anxious.   All other systems  reviewed and  are negative.    VITALS:   Blood pressure (!) 190/80, pulse 97, temperature (!) 96.7 F (35.9 C), temperature source Tympanic, resp. rate 18, weight 190 lb 7.6 oz (86.4 kg), SpO2 98%.  Wt Readings from Last 3 Encounters:  09/12/23 190 lb 7.6 oz (86.4 kg)  08/15/23 191 lb 3.2 oz (86.7 kg)  07/18/23 192 lb 6.4 oz (87.3 kg)    Body mass index is 26.94 kg/m.  Performance status (ECOG): 1 - Symptomatic but completely ambulatory  PHYSICAL EXAM:   Physical Exam Vitals and nursing note reviewed. Exam conducted with a chaperone present.  Constitutional:      Appearance: Normal appearance.  Cardiovascular:     Rate and Rhythm: Normal rate and regular rhythm.     Pulses: Normal pulses.     Heart sounds: Normal heart sounds.  Pulmonary:     Effort: Pulmonary effort is normal.     Breath sounds: Normal breath sounds.  Abdominal:     Palpations: Abdomen is soft. There is no hepatomegaly, splenomegaly or mass.     Tenderness: There is no abdominal tenderness.  Musculoskeletal:     Right lower leg: No edema.     Left lower leg: No edema.  Lymphadenopathy:     Cervical: No cervical adenopathy.     Right cervical: No superficial, deep or posterior cervical adenopathy.    Left cervical: No superficial, deep or posterior cervical adenopathy.     Upper Body:     Right upper body: No supraclavicular or axillary adenopathy.     Left upper body: No supraclavicular or axillary adenopathy.  Neurological:     General: No focal deficit present.     Mental Status: He is alert and oriented to person, place, and time.  Psychiatric:        Mood and Affect: Mood normal.        Behavior: Behavior normal.     LABS:      Latest Ref Rng & Units 09/06/2023   12:42 PM 08/15/2023   12:01 PM 07/18/2023   12:55 PM  CBC  WBC 4.0 - 10.5 K/uL 7.9  7.6  6.9   Hemoglobin 13.0 - 17.0 g/dL 16.1  09.6  04.5   Hematocrit 39.0 - 52.0 % 43.8  44.7  45.4   Platelets 150 - 400 K/uL 208  171  167        Latest Ref Rng & Units 09/06/2023   12:42 PM 08/15/2023   12:01 PM 07/18/2023   12:55 PM  CMP  Glucose 70 - 99 mg/dL 409  811  914   BUN 8 - 23 mg/dL 28  30  25    Creatinine 0.61 - 1.24 mg/dL 7.82  9.56  2.13   Sodium 135 - 145 mmol/L 139  138  139   Potassium 3.5 - 5.1 mmol/L 3.9  4.1  3.8   Chloride 98 - 111 mmol/L 100  101  100   CO2 22 - 32 mmol/L 27  27  28    Calcium 8.9 - 10.3 mg/dL 9.1  8.8  8.6   Total Protein 6.5 - 8.1 g/dL 6.3  6.5  6.8   Total Bilirubin 0.0 - 1.2 mg/dL 1.5  1.3  1.9   Alkaline Phos 38 - 126 U/L 62  65  67   AST 15 - 41 U/L 18  17  19    ALT 0 - 44 U/L 16  16  18       Lab Results  Component Value Date   CEA1 1.1 10/30/2020   /  CEA  Date Value Ref Range Status  10/30/2020 1.1 0.0 - 4.7 ng/mL Final    Comment:    (NOTE)                             Nonsmokers          <3.9                             Smokers             <5.6 Roche [employer]ostics Electrochemiluminescence Immunoassay (ECLIA) Values obtained with different assay methods or kits cannot be used interchangeably.  Results cannot be interpreted as absolute evidence of the presence or absence of malignant disease. Performed At: [facility] [address] [place], [place] [id number] Jolene Schimke MD XB:[id number]    No results found for: "PSA1" No results found for: "CAN199" No results found for: "CAN125"  Lab Results  Component Value Date   TOTALPROTELP 5.8 (L) 09/06/2023   TOTALPROTELP 5.7 (L) 09/06/2023   ALBUMINELP 3.8 09/06/2023   A1GS 0.3 09/06/2023   A2GS 0.6 09/06/2023   BETS 0.9 09/06/2023   GAMS 0.3 (L) 09/06/2023   MSPIKE Not Observed 09/06/2023   SPEI Comment 09/06/2023   Lab Results  Component Value Date   TIBC 242 (L) 10/30/2020   TIBC 266 10/29/2020   FERRITIN 1,406 (H) 10/30/2020   FERRITIN 627 (H) 10/29/2020   IRONPCTSAT 42 (H) 10/30/2020   IRONPCTSAT 47 (H) 10/29/2020   Lab Results  Component Value Date   LDH 113 01/30/2022   LDH 107  01/02/2022   LDH 135 06/19/2021     STUDIES:   No results found.

## 2023-09-12 NOTE — Patient Instructions (Signed)
 [facility] [place] [facility]  Discharge Instructions: Thank you for choosing [facility] to provide your oncology and hematology care.  If you have a lab appointment with the Cancer Center - please note that after April 8th, 2024, all labs will be drawn in the cancer center.  You do not have to check in or register with the main entrance as you have in the past but will complete your check-in in the cancer center.  Wear comfortable clothing and clothing appropriate for easy access to any Portacath or PICC line.   We strive to give you quality time with your provider. You may need to reschedule your appointment if you arrive late (15 or more minutes).  Arriving late affects you and other patients whose appointments are after yours.  Also, if you miss three or more appointments without notifying the office, you may be dismissed from the clinic at the provider's discretion.      For prescription refill requests, have your pharmacy contact our office and allow 72 hours for refills to be completed.    Today you received the following chemotherapy and/or immunotherapy agents Daratumumab and xgeva, return as scheduled.   To help prevent nausea and vomiting after your treatment, we encourage you to take your nausea medication as directed.  BELOW ARE SYMPTOMS THAT SHOULD BE REPORTED IMMEDIATELY: *FEVER GREATER THAN 100.4 F (38 C) OR HIGHER *CHILLS OR SWEATING *NAUSEA AND VOMITING THAT IS NOT CONTROLLED WITH YOUR NAUSEA MEDICATION *UNUSUAL SHORTNESS OF BREATH *UNUSUAL BRUISING OR BLEEDING *URINARY PROBLEMS (pain or burning when urinating, or frequent urination) *BOWEL PROBLEMS (unusual diarrhea, constipation, pain near the anus) TENDERNESS IN MOUTH AND THROAT WITH OR WITHOUT PRESENCE OF ULCERS (sore throat, sores in mouth, or a toothache) UNUSUAL RASH, SWELLING OR PAIN  UNUSUAL VAGINAL DISCHARGE OR ITCHING   Items with * indicate a potential  emergency and should be followed up as soon as possible or go to the Emergency Department if any problems should occur.  Please show the CHEMOTHERAPY ALERT CARD or IMMUNOTHERAPY ALERT CARD at check-in to the Emergency Department and triage nurse.  Should you have questions after your visit or need to cancel or reschedule your appointment, please contact [facility] [place] - A DEPT OF [facility] [phone]  and follow the prompts.  Office hours are 8:00 a.m. to 4:30 p.m. Monday - Friday. Please note that voicemails left after 4:00 p.m. may not be returned until the following business day.  We are closed weekends and major holidays. You have access to a nurse at all times for urgent questions. Please call the main number to the clinic [phone] and follow the prompts.  For any non-urgent questions, you may also contact your provider using MyChart. We now offer e-Visits for anyone 45 and older to request care online for non-urgent symptoms. For details visit [email or web address].   Also download the MyChart app! Go to the app store, search "MyChart", open the app, select [place], and log in with your MyChart username and password.

## 2023-09-12 NOTE — Patient Instructions (Signed)

## 2023-09-13 ENCOUNTER — Other Ambulatory Visit: Payer: Self-pay

## 2023-09-18 ENCOUNTER — Other Ambulatory Visit: Payer: Self-pay

## 2023-09-19 ENCOUNTER — Other Ambulatory Visit: Payer: Self-pay

## 2023-09-19 MED ORDER — LENALIDOMIDE 10 MG PO CAPS: 10.0000 mg | ORAL_CAPSULE | Freq: Every day | ORAL | 0 refills | Status: DC

## 2023-09-19 NOTE — Telephone Encounter (Signed)
 Chart reviewed. Revlimid refilled per last office note with Dr. Ellin Saba.

## 2023-10-09 ENCOUNTER — Inpatient Hospital Stay: Payer: 59

## 2023-10-09 ENCOUNTER — Inpatient Hospital Stay: Payer: 59 | Admitting: Hematology

## 2023-10-10 ENCOUNTER — Inpatient Hospital Stay: Payer: 59 | Attending: Hematology

## 2023-10-10 ENCOUNTER — Inpatient Hospital Stay: Payer: 59

## 2023-10-10 VITALS — BP 166/66 | HR 58 | Temp 98.2°F | Resp 20 | Wt 188.5 lb

## 2023-10-10 DIAGNOSIS — C9000 Multiple myeloma not having achieved remission: Secondary | ICD-10-CM | POA: Diagnosis not present

## 2023-10-10 DIAGNOSIS — Z5112 Encounter for antineoplastic immunotherapy: Secondary | ICD-10-CM | POA: Diagnosis not present

## 2023-10-10 DIAGNOSIS — Z79620 Long term (current) use of immunosuppressive biologic: Secondary | ICD-10-CM | POA: Diagnosis not present

## 2023-10-10 LAB — COMPREHENSIVE METABOLIC PANEL WITH GFR
ALT: 15 U/L (ref 0–44)
AST: 17 U/L (ref 15–41)
Albumin: 4 g/dL (ref 3.5–5.0)
Alkaline Phosphatase: 60 U/L (ref 38–126)
Anion gap: 11 (ref 5–15)
BUN: 24 mg/dL — ABNORMAL HIGH (ref 8–23)
CO2: 28 mmol/L (ref 22–32)
Calcium: 8.5 mg/dL — ABNORMAL LOW (ref 8.9–10.3)
Chloride: 98 mmol/L (ref 98–111)
Creatinine, Ser: 1.2 mg/dL (ref 0.61–1.24)
GFR, Estimated: >60 mL/min (ref >60)
Glucose, Bld: 127 mg/dL — ABNORMAL HIGH (ref 70–99)
Potassium: 4 mmol/L (ref 3.5–5.1)
Sodium: 137 mmol/L (ref 135–145)
Total Bilirubin: 1.6 mg/dL — ABNORMAL HIGH (ref 0.0–1.2)
Total Protein: 6.3 g/dL — ABNORMAL LOW (ref 6.5–8.1)

## 2023-10-10 LAB — CBC WITH DIFFERENTIAL/PLATELET
Abs Immature Granulocytes: 0.03 10*3/uL (ref 0.00–0.07)
Basophils Absolute: 0.1 10*3/uL (ref 0.0–0.1)
Basophils Relative: 1 %
Eosinophils Absolute: 0.1 10*3/uL (ref 0.0–0.5)
Eosinophils Relative: 2 %
HCT: 43.7 % (ref 39.0–52.0)
Hemoglobin: 14.4 g/dL (ref 13.0–17.0)
Immature Granulocytes: 0 %
Lymphocytes Relative: 14 %
Lymphs Abs: 1.1 10*3/uL (ref 0.7–4.0)
MCH: 27.5 pg (ref 26.0–34.0)
MCHC: 33 g/dL (ref 30.0–36.0)
MCV: 83.6 fL (ref 80.0–100.0)
Monocytes Absolute: 0.8 10*3/uL (ref 0.1–1.0)
Monocytes Relative: 11 %
Neutro Abs: 5.5 10*3/uL (ref 1.7–7.7)
Neutrophils Relative %: 72 %
Platelets: 162 10*3/uL (ref 150–400)
RBC: 5.23 MIL/uL (ref 4.22–5.81)
RDW: 15 % (ref 11.5–15.5)
WBC: 7.5 10*3/uL (ref 4.0–10.5)
nRBC: 0 % (ref 0.0–0.2)

## 2023-10-10 MED ORDER — DENOSUMAB 120 MG/1.7ML ~~LOC~~ SOLN
120.0000 mg | Freq: Once | SUBCUTANEOUS | Status: AC
  Administered 2023-10-10: 120 mg via SUBCUTANEOUS
  Filled 2023-10-10: qty 1.7

## 2023-10-10 MED ORDER — CETIRIZINE HCL 10 MG PO TABS
10.0000 mg | ORAL_TABLET | Freq: Once | ORAL | Status: AC
  Administered 2023-10-10: 10 mg via ORAL
  Filled 2023-10-10: qty 1

## 2023-10-10 MED ORDER — DEXAMETHASONE 4 MG PO TABS
40.0000 mg | ORAL_TABLET | Freq: Once | ORAL | Status: AC
Start: 2023-10-10 — End: 2023-10-10
  Administered 2023-10-10: 40 mg via ORAL
  Filled 2023-10-10: qty 10

## 2023-10-10 MED ORDER — ACETAMINOPHEN 325 MG PO TABS
650.0000 mg | ORAL_TABLET | Freq: Once | ORAL | Status: AC
  Administered 2023-10-10: 650 mg via ORAL
  Filled 2023-10-10: qty 2

## 2023-10-10 MED ORDER — DARATUMUMAB-HYALURONIDASE-FIHJ 1800-30000 MG-UT/15ML ~~LOC~~ SOLN
1800.0000 mg | Freq: Once | SUBCUTANEOUS | Status: AC
  Administered 2023-10-10: 1800 mg via SUBCUTANEOUS
  Filled 2023-10-10: qty 15

## 2023-10-10 NOTE — Patient Instructions (Signed)
 [facility] [place] - A DEPT OF [facility]  Discharge Instructions: Thank you for choosing [facility] to provide your oncology and hematology care.  If you have a lab appointment with the Cancer Center - please note that after April 8th, 2024, all labs will be drawn in the cancer center.  You do not have to check in or register with the main entrance as you have in the past but will complete your check-in in the cancer center.  Wear comfortable clothing and clothing appropriate for easy access to any Portacath or PICC line.   We strive to give you quality time with your provider. You may need to reschedule your appointment if you arrive late (15 or more minutes).  Arriving late affects you and other patients whose appointments are after yours.  Also, if you miss three or more appointments without notifying the office, you may be dismissed from the clinic at the provider's discretion.      For prescription refill requests, have your pharmacy contact our office and allow 72 hours for refills to be completed.    Today you received the following chemotherapy and/or immunotherapy agents Daratumumab/Xgeva.  Daratumumab; Hyaluronidase Injection What is this medication? DARATUMUMAB; HYALURONIDASE (dar a toom ue mab; hye al ur ON i dase) treats multiple myeloma, a type of bone marrow cancer. Daratumumab works by blocking a protein that causes cancer cells to grow and multiply. This helps to slow or stop the spread of cancer cells. Hyaluronidase works by increasing the absorption of other medications in the body to help them work better. This medication may also be used treat amyloidosis, a condition that causes the buildup of a protein (amyloid) in your body. It works by reducing the buildup of this protein, which decreases symptoms. It is a combination medication that contains a monoclonal antibody. This medicine may be used for other purposes; ask your health care  provider or pharmacist if you have questions. COMMON BRAND NAME(S): DARZALEX FASPRO What should I tell my care team before I take this medication? They need to know if you have any of these conditions: Heart disease Infection, such as chickenpox, cold sores, herpes, hepatitis B Lung or breathing disease An unusual or allergic reaction to daratumumab, hyaluronidase, other medications, foods, dyes, or preservatives Pregnant or trying to get pregnant Breast-feeding How should I use this medication? This medication is injected under the skin. It is given by your care team in a hospital or clinic setting. Talk to your care team about the use of this medication in children. Special care may be needed. Overdosage: If you think you have taken too much of this medicine contact a poison control center or emergency room at once. NOTE: This medicine is only for you. Do not share this medicine with others. What if I miss a dose? Keep appointments for follow-up doses. It is important not to miss your dose. Call your care team if you are unable to keep an appointment. What may interact with this medication? Interactions have not been studied. This list may not describe all possible interactions. Give your health care provider a list of all the medicines, herbs, non-prescription drugs, or dietary supplements you use. Also tell them if you smoke, drink alcohol, or use illegal drugs. Some items may interact with your medicine. What should I watch for while using this medication? Your condition will be monitored carefully while you are receiving this medication. This medication can cause serious allergic  reactions. To reduce your risk, your care team may give you other medication to take before receiving this one. Be sure to follow the directions from your care team. This medication can affect the results of blood tests to match your blood type. These changes can last for up to 6 months after the final dose.  Your care team will do blood tests to match your blood type before you start treatment. Tell all of your care team that you are being treated with this medication before receiving a blood transfusion. This medication can affect the results of some tests used to determine treatment response; extra tests may be needed to evaluate response. Talk to your care team if you wish to become pregnant or think you are pregnant. This medication can cause serious birth defects if taken during pregnancy and for 3 months after the last dose. A reliable form of contraception is recommended while taking this medication and for 3 months after the last dose. Talk to your care team about effective forms of contraception. Do not breast-feed while taking this medication. What side effects may I notice from receiving this medication? Side effects that you should report to your care team as soon as possible: Allergic reactions--skin rash, itching, hives, swelling of the face, lips, tongue, or throat Heart rhythm changes--fast or irregular heartbeat, dizziness, feeling faint or lightheaded, chest pain, trouble breathing Infection--fever, chills, cough, sore throat, wounds that don't heal, pain or trouble when passing urine, general feeling of discomfort or being unwell Infusion reactions--chest pain, shortness of breath or trouble breathing, feeling faint or lightheaded Sudden eye pain or change in vision such as blurry vision, seeing halos around lights, vision loss Unusual bruising or bleeding Side effects that usually do not require medical attention (report to your care team if they continue or are bothersome): Constipation Diarrhea Fatigue Nausea Pain, tingling, or numbness in the hands or feet Swelling of the ankles, hands, or feet This list may not describe all possible side effects. Call your doctor for medical advice about side effects. You may report side effects to FDA at 1-800-FDA-1088. Where should I keep my  medication? This medication is given in a hospital or clinic. It will not be stored at home. NOTE: This sheet is a summary. It may not cover all possible information. If you have questions about this medicine, talk to your doctor, pharmacist, or health care provider.  2024 Elsevier/Gold Standard (2021-11-07 00:00:00)   Denosumab Injection (Oncology) What is this medication? DENOSUMAB (den oh SUE mab) prevents weakened bones caused by cancer. It may also be used to treat noncancerous bone tumors that cannot be removed by surgery. It can also be used to treat high calcium levels in the blood caused by cancer. It works by blocking a protein that causes bones to break down quickly. This slows down the release of calcium from bones, which lowers calcium levels in your blood. It also makes your bones stronger and less likely to break (fracture). This medicine may be used for other purposes; ask your health care provider or pharmacist if you have questions. COMMON BRAND NAME(S): XGEVA What should I tell my care team before I take this medication? They need to know if you have any of these conditions: Dental disease Having surgery or tooth extraction Infection Kidney disease Low levels of calcium or vitamin D in the blood Malnutrition On hemodialysis Skin conditions or sensitivity Thyroid or parathyroid disease An unusual reaction to denosumab, other medications, foods, dyes, or preservatives Pregnant  or trying to get pregnant Breast-feeding How should I use this medication? This medication is for injection under the skin. It is given by your care team in a hospital or clinic setting. A special MedGuide will be given to you before each treatment. Be sure to read this information carefully each time. Talk to your care team about the use of this medication in children. While it may be prescribed for children as young as 13 years for selected conditions, precautions do apply. Overdosage: If you  think you have taken too much of this medicine contact a poison control center or emergency room at once. NOTE: This medicine is only for you. Do not share this medicine with others. What if I miss a dose? Keep appointments for follow-up doses. It is important not to miss your dose. Call your care team if you are unable to keep an appointment. What may interact with this medication? Do not take this medication with any of the following: Other medications containing denosumab This medication may also interact with the following: Medications that lower your chance of fighting infection Steroid medications, such as prednisone or cortisone This list may not describe all possible interactions. Give your health care provider a list of all the medicines, herbs, non-prescription drugs, or dietary supplements you use. Also tell them if you smoke, drink alcohol, or use illegal drugs. Some items may interact with your medicine. What should I watch for while using this medication? Your condition will be monitored carefully while you are receiving this medication. You may need blood work while taking this medication. This medication may increase your risk of getting an infection. Call your care team for advice if you get a fever, chills, sore throat, or other symptoms of a cold or flu. Do not treat yourself. Try to avoid being around people who are sick. You should make sure you get enough calcium and vitamin D while you are taking this medication, unless your care team tells you not to. Discuss the foods you eat and the vitamins you take with your care team. Some people who take this medication have severe bone, joint, or muscle pain. This medication may also increase your risk for jaw problems or a broken thigh bone. Tell your care team right away if you have severe pain in your jaw, bones, joints, or muscles. Tell your care team if you have any pain that does not go away or that gets worse. Talk to your care  team if you may be pregnant. Serious birth defects can occur if you take this medication during pregnancy and for 5 months after the last dose. You will need a negative pregnancy test before starting this medication. Contraception is recommended while taking this medication and for 5 months after the last dose. Your care team can help you find the option that works for you. What side effects may I notice from receiving this medication? Side effects that you should report to your care team as soon as possible: Allergic reactions--skin rash, itching, hives, swelling of the face, lips, tongue, or throat Bone, joint, or muscle pain Low calcium level--muscle pain or cramps, confusion, tingling, or numbness in the hands or feet Osteonecrosis of the jaw--pain, swelling, or redness in the mouth, numbness of the jaw, poor healing after dental work, unusual discharge from the mouth, visible bones in the mouth Side effects that usually do not require medical attention (report to your care team if they continue or are bothersome): Cough Diarrhea Fatigue Headache Nausea  This list may not describe all possible side effects. Call your doctor for medical advice about side effects. You may report side effects to FDA at 1-800-FDA-1088. Where should I keep my medication? This medication is given in a hospital or clinic. It will not be stored at home. NOTE: This sheet is a summary. It may not cover all possible information. If you have questions about this medicine, talk to your doctor, pharmacist, or health care provider.  2024 Elsevier/Gold Standard (2021-11-22 00:00:00)       To help prevent nausea and vomiting after your treatment, we encourage you to take your nausea medication as directed.  BELOW ARE SYMPTOMS THAT SHOULD BE REPORTED IMMEDIATELY: *FEVER GREATER THAN 100.4 F (38 C) OR HIGHER *CHILLS OR SWEATING *NAUSEA AND VOMITING THAT IS NOT CONTROLLED WITH YOUR NAUSEA MEDICATION *UNUSUAL SHORTNESS OF  BREATH *UNUSUAL BRUISING OR BLEEDING *URINARY PROBLEMS (pain or burning when urinating, or frequent urination) *BOWEL PROBLEMS (unusual diarrhea, constipation, pain near the anus) TENDERNESS IN MOUTH AND THROAT WITH OR WITHOUT PRESENCE OF ULCERS (sore throat, sores in mouth, or a toothache) UNUSUAL RASH, SWELLING OR PAIN  UNUSUAL VAGINAL DISCHARGE OR ITCHING   Items with * indicate a potential emergency and should be followed up as soon as possible or go to the Emergency Department if any problems should occur.  Please show the CHEMOTHERAPY ALERT CARD or IMMUNOTHERAPY ALERT CARD at check-in to the Emergency Department and triage nurse.  Should you have questions after your visit or need to cancel or reschedule your appointment, please contact [facility] [place] - A DEPT OF [facility]igh[facility] [phone]  and follow the prompts.  Office hours are 8:00 a.m. to 4:30 p.m. Monday - Friday. Please note that voicemails left after 4:00 p.m. may not be returned until the following business day.  We are closed weekends and major holidays. You have access to a nurse at all times for urgent questions. Please call the main number to the clinic [phone] and follow the prompts.  For any non-urgent questions, you may also contact your provider using MyChart. We now offer e-Visits for anyone 77 and older to request care online for non-urgent symptoms. For details visit [email or web address].   Also download the MyChart app! Go to the app store, search "MyChart", open the app, select [place], and log in with your MyChart username and password.

## 2023-10-10 NOTE — Progress Notes (Signed)
 Patient presents today for Daratumumab and Xgeva injections.  Patient is in satisfactory condition with no new complaints voiced.  Vital signs are stable.  Labs reviewed.  Bilirubin today is 1.6.  MD made aware.  We will proceed with treatment per MD orders.    Patient tolerated injection with no complaints voiced.  Site clean and dry with no bruising or swelling noted.  No complaints of pain.  Discharged with vital signs stable and no signs or symptoms of distress noted.

## 2023-10-16 ENCOUNTER — Other Ambulatory Visit: Payer: Self-pay

## 2023-10-16 MED ORDER — LENALIDOMIDE 10 MG PO CAPS: 10.0000 mg | ORAL_CAPSULE | Freq: Every day | ORAL | 0 refills | Status: DC

## 2023-10-16 NOTE — Telephone Encounter (Signed)
 Chart reviewed. Revlimid refilled per last office note with Dr. Ellin Saba.

## 2023-10-29 ENCOUNTER — Other Ambulatory Visit (HOSPITAL_COMMUNITY): Payer: Self-pay

## 2023-10-29 ENCOUNTER — Other Ambulatory Visit: Payer: Self-pay

## 2023-10-29 ENCOUNTER — Telehealth: Payer: Self-pay | Admitting: Pharmacy Technician

## 2023-10-29 ENCOUNTER — Telehealth: Payer: Self-pay | Admitting: *Deleted

## 2023-10-29 MED ORDER — REVLIMID 10 MG PO CAPS: 10.0000 mg | ORAL_CAPSULE | Freq: Every day | ORAL | 0 refills | Status: DC

## 2023-10-29 NOTE — Telephone Encounter (Signed)
 Oral Oncology Patient Advocate Encounter   Per nurse,Tomi, patient's sister is requesting brand Revlimid due to supposed side effects patient is experiencing with generic Lenalidomide.  Received notification that prior authorization for Revlimid is required.   PA submitted on 10/29/2023 Key [id number] Status is pending     Patty Benjaman Branch, CPhT Oncology Pharmacy Patient Advocate [facility] Direct Number: [phone] Fax: [phone]

## 2023-10-29 NOTE — Telephone Encounter (Signed)
 Oral Oncology Patient Advocate Encounter  Prior Authorization for Revlimid has been approved.    PA# [id number] Effective dates: 10/29/2023 through 07/15/2024  Patient can continue to fill at [facility].   Patty Benjaman Branch, CPhT Oncology Pharmacy Patient Advocate [facility] Direct Number: [phone] Fax: [phone]

## 2023-10-29 NOTE — Telephone Encounter (Signed)
 Sister Benn Brash called to report that patient has experienced altered personality, irritable and agitated, severe fatigue since starting Lenalidomide rather than brand Revlimid. Started shortly after his first couple of doses and has worsened since that time.  Will discuss with Pharmacist to see if we can get him back on Revlimid, as he had no known side effects while taking.

## 2023-10-31 ENCOUNTER — Other Ambulatory Visit: Payer: Self-pay

## 2023-10-31 MED ORDER — REVLIMID 10 MG PO CAPS: 10.0000 mg | ORAL_CAPSULE | Freq: Every day | ORAL | 0 refills | Status: DC

## 2023-11-06 ENCOUNTER — Other Ambulatory Visit: Payer: Self-pay

## 2023-11-06 DIAGNOSIS — C9000 Multiple myeloma not having achieved remission: Secondary | ICD-10-CM

## 2023-11-07 ENCOUNTER — Inpatient Hospital Stay: Payer: 59

## 2023-11-07 ENCOUNTER — Inpatient Hospital Stay: Payer: 59 | Attending: Hematology

## 2023-11-07 VITALS — BP 161/72 | HR 62 | Temp 98.0°F | Resp 18

## 2023-11-07 DIAGNOSIS — C9000 Multiple myeloma not having achieved remission: Secondary | ICD-10-CM | POA: Insufficient documentation

## 2023-11-07 DIAGNOSIS — Z5112 Encounter for antineoplastic immunotherapy: Secondary | ICD-10-CM | POA: Diagnosis not present

## 2023-11-07 DIAGNOSIS — Z79620 Long term (current) use of immunosuppressive biologic: Secondary | ICD-10-CM | POA: Diagnosis not present

## 2023-11-07 DIAGNOSIS — Z79631 Long term (current) use of antimetabolite agent: Secondary | ICD-10-CM | POA: Insufficient documentation

## 2023-11-07 LAB — CBC WITH DIFFERENTIAL/PLATELET
Abs Immature Granulocytes: 0.03 10*3/uL (ref 0.00–0.07)
Basophils Absolute: 0.1 10*3/uL (ref 0.0–0.1)
Basophils Relative: 1 %
Eosinophils Absolute: 0.1 10*3/uL (ref 0.0–0.5)
Eosinophils Relative: 1 %
HCT: 43.6 % (ref 39.0–52.0)
Hemoglobin: 14.3 g/dL (ref 13.0–17.0)
Immature Granulocytes: 0 %
Lymphocytes Relative: 14 %
Lymphs Abs: 1.1 10*3/uL (ref 0.7–4.0)
MCH: 27.1 pg (ref 26.0–34.0)
MCHC: 32.8 g/dL (ref 30.0–36.0)
MCV: 82.7 fL (ref 80.0–100.0)
Monocytes Absolute: 0.8 10*3/uL (ref 0.1–1.0)
Monocytes Relative: 10 %
Neutro Abs: 6.1 10*3/uL (ref 1.7–7.7)
Neutrophils Relative %: 74 %
Platelets: 162 10*3/uL (ref 150–400)
RBC: 5.27 MIL/uL (ref 4.22–5.81)
RDW: 15.1 % (ref 11.5–15.5)
WBC: 8.3 10*3/uL (ref 4.0–10.5)
nRBC: 0 % (ref 0.0–0.2)

## 2023-11-07 LAB — MAGNESIUM: Magnesium: 1.9 mg/dL (ref 1.7–2.4)

## 2023-11-07 LAB — COMPREHENSIVE METABOLIC PANEL WITH GFR
ALT: 16 U/L (ref 0–44)
AST: 18 U/L (ref 15–41)
Albumin: 3.9 g/dL (ref 3.5–5.0)
Alkaline Phosphatase: 61 U/L (ref 38–126)
Anion gap: 10 (ref 5–15)
BUN: 30 mg/dL — ABNORMAL HIGH (ref 8–23)
CO2: 27 mmol/L (ref 22–32)
Calcium: 9.1 mg/dL (ref 8.9–10.3)
Chloride: 98 mmol/L (ref 98–111)
Creatinine, Ser: 1.29 mg/dL — ABNORMAL HIGH (ref 0.61–1.24)
GFR, Estimated: >60 mL/min (ref >60)
Glucose, Bld: 103 mg/dL — ABNORMAL HIGH (ref 70–99)
Potassium: 3.9 mmol/L (ref 3.5–5.1)
Sodium: 135 mmol/L (ref 135–145)
Total Bilirubin: 1.6 mg/dL — ABNORMAL HIGH (ref 0.0–1.2)
Total Protein: 6.2 g/dL — ABNORMAL LOW (ref 6.5–8.1)

## 2023-11-07 MED ORDER — ACETAMINOPHEN 325 MG PO TABS
650.0000 mg | ORAL_TABLET | Freq: Once | ORAL | Status: AC
  Administered 2023-11-07: 650 mg via ORAL
  Filled 2023-11-07: qty 2

## 2023-11-07 MED ORDER — DEXAMETHASONE 4 MG PO TABS
40.0000 mg | ORAL_TABLET | Freq: Once | ORAL | Status: AC
Start: 2023-11-07 — End: 2023-11-07
  Administered 2023-11-07: 40 mg via ORAL
  Filled 2023-11-07: qty 10

## 2023-11-07 MED ORDER — CETIRIZINE HCL 10 MG PO TABS
10.0000 mg | ORAL_TABLET | Freq: Once | ORAL | Status: AC
  Administered 2023-11-07: 10 mg via ORAL
  Filled 2023-11-07: qty 1

## 2023-11-07 MED ORDER — DENOSUMAB 120 MG/1.7ML ~~LOC~~ SOLN
120.0000 mg | Freq: Once | SUBCUTANEOUS | Status: AC
  Administered 2023-11-07: 120 mg via SUBCUTANEOUS
  Filled 2023-11-07: qty 1.7

## 2023-11-07 MED ORDER — DARATUMUMAB-HYALURONIDASE-FIHJ 1800-30000 MG-UT/15ML ~~LOC~~ SOLN
1800.0000 mg | Freq: Once | SUBCUTANEOUS | Status: AC
  Administered 2023-11-07: 1800 mg via SUBCUTANEOUS
  Filled 2023-11-07: qty 15

## 2023-11-07 NOTE — Patient Instructions (Signed)
 [facility] [place] - A DEPT OF [facility]  Discharge Instructions: Thank you for choosing [facility] to provide your oncology and hematology care.  If you have a lab appointment with the Cancer Center - please note that after April 8th, 2024, all labs will be drawn in the cancer center.  You do not have to check in or register with the main entrance as you have in the past but will complete your check-in in the cancer center.  Wear comfortable clothing and clothing appropriate for easy access to any Portacath or PICC line.   We strive to give you quality time with your provider. You may need to reschedule your appointment if you arrive late (15 or more minutes).  Arriving late affects you and other patients whose appointments are after yours.  Also, if you miss three or more appointments without notifying the office, you may be dismissed from the clinic at the provider's discretion.      For prescription refill requests, have your pharmacy contact our office and allow 72 hours for refills to be completed.    Today you received the following chemotherapy and/or immunotherapy agents Daratumumab  and Xgeva ,  return as scheduled.    To help prevent nausea and vomiting after your treatment, we encourage you to take your nausea medication as directed.  BELOW ARE SYMPTOMS THAT SHOULD BE REPORTED IMMEDIATELY: *FEVER GREATER THAN 100.4 F (38 C) OR HIGHER *CHILLS OR SWEATING *NAUSEA AND VOMITING THAT IS NOT CONTROLLED WITH YOUR NAUSEA MEDICATION *UNUSUAL SHORTNESS OF BREATH *UNUSUAL BRUISING OR BLEEDING *URINARY PROBLEMS (pain or burning when urinating, or frequent urination) *BOWEL PROBLEMS (unusual diarrhea, constipation, pain near the anus) TENDERNESS IN MOUTH AND THROAT WITH OR WITHOUT PRESENCE OF ULCERS (sore throat, sores in mouth, or a toothache) UNUSUAL RASH, SWELLING OR PAIN  UNUSUAL VAGINAL DISCHARGE OR ITCHING   Items with * indicate a potential  emergency and should be followed up as soon as possible or go to the Emergency Department if any problems should occur.  Please show the CHEMOTHERAPY ALERT CARD or IMMUNOTHERAPY ALERT CARD at check-in to the Emergency Department and triage nurse.  Should you have questions after your visit or need to cancel or reschedule your appointment, please contact [facility] [place] - A DEPT OF [facility] [phone]  and follow the prompts.  Office hours are 8:00 a.m. to 4:30 p.m. Monday - Friday. Please note that voicemails left after 4:00 p.m. may not be returned until the following business day.  We are closed weekends and major holidays. You have access to a nurse at all times for urgent questions. Please call the main number to the clinic [phone] and follow the prompts.  For any non-urgent questions, you may also contact your provider using MyChart. We now offer e-Visits for anyone 66 and older to request care online for non-urgent symptoms. For details visit [email or web address].   Also download the MyChart app! Go to the app store, search "MyChart", open the app, select [place], and log in with your MyChart username and password.

## 2023-11-07 NOTE — Progress Notes (Signed)
 Patient presents today for Dartumumab and Xgeva , Bilirubin 1.6, okay to treat per Dr. Cheree Cords. Patient taking calcium  as directed. Denied tooth, jaw, and leg pain. No recent or upcoming dental visits. Labs reviewed. Patient tolerated injection with no complaints voiced. See MAR for details. Patient stable during and after injection. Site clean and dry with no bruising or swelling noted. Band aid applied. Patient tolerated Daratumumab  injection with no complaints voiced. See MAR for details. Lab reviewed. Injection site clean and dry with no bruising or swelling noted at site. Band aid applied. Vss with discharge and left in satisfactory condition with nos/s of distress noted.

## 2023-11-18 ENCOUNTER — Other Ambulatory Visit: Payer: Self-pay | Admitting: Hematology

## 2023-11-19 ENCOUNTER — Encounter: Payer: Self-pay | Admitting: *Deleted

## 2023-11-21 ENCOUNTER — Other Ambulatory Visit: Payer: Self-pay | Admitting: *Deleted

## 2023-11-21 MED ORDER — REVLIMID 10 MG PO CAPS: 10.0000 mg | ORAL_CAPSULE | Freq: Every day | ORAL | 0 refills | Status: DC

## 2023-11-27 ENCOUNTER — Other Ambulatory Visit: Payer: Self-pay

## 2023-11-27 DIAGNOSIS — C9000 Multiple myeloma not having achieved remission: Secondary | ICD-10-CM

## 2023-11-28 ENCOUNTER — Inpatient Hospital Stay: Payer: 59 | Attending: Hematology

## 2023-11-28 DIAGNOSIS — Z5112 Encounter for antineoplastic immunotherapy: Secondary | ICD-10-CM | POA: Insufficient documentation

## 2023-11-28 DIAGNOSIS — Z79620 Long term (current) use of immunosuppressive biologic: Secondary | ICD-10-CM | POA: Insufficient documentation

## 2023-11-28 DIAGNOSIS — C9000 Multiple myeloma not having achieved remission: Secondary | ICD-10-CM | POA: Diagnosis not present

## 2023-11-28 LAB — CBC WITH DIFFERENTIAL/PLATELET
Abs Immature Granulocytes: 0.03 10*3/uL (ref 0.00–0.07)
Basophils Absolute: 0.1 10*3/uL (ref 0.0–0.1)
Basophils Relative: 1 %
Eosinophils Absolute: 0.2 10*3/uL (ref 0.0–0.5)
Eosinophils Relative: 3 %
HCT: 44.3 % (ref 39.0–52.0)
Hemoglobin: 14.5 g/dL (ref 13.0–17.0)
Immature Granulocytes: 0 %
Lymphocytes Relative: 19 %
Lymphs Abs: 1.4 10*3/uL (ref 0.7–4.0)
MCH: 27.2 pg (ref 26.0–34.0)
MCHC: 32.7 g/dL (ref 30.0–36.0)
MCV: 83 fL (ref 80.0–100.0)
Monocytes Absolute: 1.3 10*3/uL — ABNORMAL HIGH (ref 0.1–1.0)
Monocytes Relative: 17 %
Neutro Abs: 4.6 10*3/uL (ref 1.7–7.7)
Neutrophils Relative %: 60 %
Platelets: 196 10*3/uL (ref 150–400)
RBC: 5.34 MIL/uL (ref 4.22–5.81)
RDW: 15.4 % (ref 11.5–15.5)
WBC: 7.6 10*3/uL (ref 4.0–10.5)
nRBC: 0 % (ref 0.0–0.2)

## 2023-11-28 LAB — COMPREHENSIVE METABOLIC PANEL WITH GFR
ALT: 14 U/L (ref 0–44)
AST: 17 U/L (ref 15–41)
Albumin: 4 g/dL (ref 3.5–5.0)
Alkaline Phosphatase: 66 U/L (ref 38–126)
Anion gap: 11 (ref 5–15)
BUN: 25 mg/dL — ABNORMAL HIGH (ref 8–23)
CO2: 24 mmol/L (ref 22–32)
Calcium: 8.9 mg/dL (ref 8.9–10.3)
Chloride: 100 mmol/L (ref 98–111)
Creatinine, Ser: 1.1 mg/dL (ref 0.61–1.24)
GFR, Estimated: >60 mL/min (ref >60)
Glucose, Bld: 129 mg/dL — ABNORMAL HIGH (ref 70–99)
Potassium: 4 mmol/L (ref 3.5–5.1)
Sodium: 135 mmol/L (ref 135–145)
Total Bilirubin: 1.2 mg/dL (ref 0.0–1.2)
Total Protein: 6.6 g/dL (ref 6.5–8.1)

## 2023-11-28 LAB — MAGNESIUM: Magnesium: 2.1 mg/dL (ref 1.7–2.4)

## 2023-11-29 LAB — KAPPA/LAMBDA LIGHT CHAINS
Kappa free light chain: 7.4 mg/L (ref 3.3–19.4)
Kappa, lambda light chain ratio: 2.11 — ABNORMAL HIGH (ref 0.26–1.65)
Lambda free light chains: 3.5 mg/L — ABNORMAL LOW (ref 5.7–26.3)

## 2023-12-02 LAB — PROTEIN ELECTROPHORESIS, SERUM
A/G Ratio: 1.7 (ref 0.7–1.7)
Albumin ELP: 3.7 g/dL (ref 2.9–4.4)
Alpha-1-Globulin: 0.3 g/dL (ref 0.0–0.4)
Alpha-2-Globulin: 0.7 g/dL (ref 0.4–1.0)
Beta Globulin: 0.9 g/dL (ref 0.7–1.3)
Gamma Globulin: 0.2 g/dL — ABNORMAL LOW (ref 0.4–1.8)
Globulin, Total: 2.2 g/dL (ref 2.2–3.9)
Total Protein ELP: 5.9 g/dL — ABNORMAL LOW (ref 6.0–8.5)

## 2023-12-02 LAB — IMMUNOFIXATION ELECTROPHORESIS
IgA: 17 mg/dL — ABNORMAL LOW (ref 61–437)
IgG (Immunoglobin G), Serum: 299 mg/dL — ABNORMAL LOW (ref 603–1613)
IgM (Immunoglobulin M), Srm: 6 mg/dL — ABNORMAL LOW (ref 20–172)
Total Protein ELP: 5.9 g/dL — ABNORMAL LOW (ref 6.0–8.5)

## 2023-12-05 ENCOUNTER — Inpatient Hospital Stay (HOSPITAL_BASED_OUTPATIENT_CLINIC_OR_DEPARTMENT_OTHER): Payer: 59 | Admitting: Hematology

## 2023-12-05 ENCOUNTER — Inpatient Hospital Stay: Payer: 59

## 2023-12-05 VITALS — BP 180/69 | HR 86 | Temp 97.1°F | Resp 20 | Wt 187.4 lb

## 2023-12-05 DIAGNOSIS — C9000 Multiple myeloma not having achieved remission: Secondary | ICD-10-CM

## 2023-12-05 DIAGNOSIS — Z5112 Encounter for antineoplastic immunotherapy: Secondary | ICD-10-CM | POA: Diagnosis not present

## 2023-12-05 DIAGNOSIS — Z79620 Long term (current) use of immunosuppressive biologic: Secondary | ICD-10-CM | POA: Diagnosis not present

## 2023-12-05 MED ORDER — ACETAMINOPHEN 325 MG PO TABS
650.0000 mg | ORAL_TABLET | Freq: Once | ORAL | Status: AC
  Administered 2023-12-05: 650 mg via ORAL
  Filled 2023-12-05: qty 2

## 2023-12-05 MED ORDER — DEXAMETHASONE 4 MG PO TABS
40.0000 mg | ORAL_TABLET | Freq: Once | ORAL | Status: AC
Start: 2023-12-05 — End: 2023-12-05
  Administered 2023-12-05: 40 mg via ORAL
  Filled 2023-12-05: qty 10

## 2023-12-05 MED ORDER — DENOSUMAB 120 MG/1.7ML ~~LOC~~ SOLN
120.0000 mg | Freq: Once | SUBCUTANEOUS | Status: AC
  Administered 2023-12-05: 120 mg via SUBCUTANEOUS
  Filled 2023-12-05: qty 1.7

## 2023-12-05 MED ORDER — CETIRIZINE HCL 10 MG PO TABS
10.0000 mg | ORAL_TABLET | Freq: Once | ORAL | Status: AC
  Administered 2023-12-05: 10 mg via ORAL
  Filled 2023-12-05: qty 1

## 2023-12-05 MED ORDER — DARATUMUMAB-HYALURONIDASE-FIHJ 1800-30000 MG-UT/15ML ~~LOC~~ SOLN
1800.0000 mg | Freq: Once | SUBCUTANEOUS | Status: AC
  Administered 2023-12-05: 1800 mg via SUBCUTANEOUS
  Filled 2023-12-05: qty 15

## 2023-12-05 MED ORDER — REVLIMID 10 MG PO CAPS: 10.0000 mg | ORAL_CAPSULE | Freq: Every day | ORAL | 0 refills | Status: DC

## 2023-12-05 NOTE — Patient Instructions (Signed)
 [facility] [place] - A DEPT OF [facility]  Discharge Instructions: Thank you for choosing [facility] to provide your oncology and hematology care.  If you have a lab appointment with the Cancer Center - please note that after April 8th, 2024, all labs will be drawn in the cancer center.  You do not have to check in or register with the main entrance as you have in the past but will complete your check-in in the cancer center.  Wear comfortable clothing and clothing appropriate for easy access to any Portacath or PICC line.   We strive to give you quality time with your provider. You may need to reschedule your appointment if you arrive late (15 or more minutes).  Arriving late affects you and other patients whose appointments are after yours.  Also, if you miss three or more appointments without notifying the office, you may be dismissed from the clinic at the provider's discretion.      For prescription refill requests, have your pharmacy contact our office and allow 72 hours for refills to be completed.    Today you received the following chemotherapy and/or immunotherapy agents Darzalex  Faspro and Xgeva    To help prevent nausea and vomiting after your treatment, we encourage you to take your nausea medication as directed.  Daratumumab ; Hyaluronidase  Injection What is this medication? DARATUMUMAB ; HYALURONIDASE  (dar a toom ue mab; hye al ur ON i dase) treats multiple myeloma, a type of bone marrow cancer. Daratumumab  works by blocking a protein that causes cancer cells to grow and multiply. This helps to slow or stop the spread of cancer cells. Hyaluronidase  works by increasing the absorption of other medications in the body to help them work better. This medication may also be used treat amyloidosis, a condition that causes the buildup of a protein (amyloid) in your body. It works by reducing the buildup of this protein, which decreases symptoms. It is a  combination medication that contains a monoclonal antibody. This medicine may be used for other purposes; ask your health care provider or pharmacist if you have questions. COMMON BRAND NAME(S): DARZALEX  FASPRO What should I tell my care team before I take this medication? They need to know if you have any of these conditions: Heart disease Infection, such as chickenpox, cold sores, herpes, hepatitis B Lung or breathing disease An unusual or allergic reaction to daratumumab , hyaluronidase , other medications, foods, dyes, or preservatives Pregnant or trying to get pregnant Breast-feeding How should I use this medication? This medication is injected under the skin. It is given by your care team in a hospital or clinic setting. Talk to your care team about the use of this medication in children. Special care may be needed. Overdosage: If you think you have taken too much of this medicine contact a poison control center or emergency room at once. NOTE: This medicine is only for you. Do not share this medicine with others. What if I miss a dose? Keep appointments for follow-up doses. It is important not to miss your dose. Call your care team if you are unable to keep an appointment. What may interact with this medication? Interactions have not been studied. This list may not describe all possible interactions. Give your health care provider a list of all the medicines, herbs, non-prescription drugs, or dietary supplements you use. Also tell them if you smoke, drink alcohol, or use illegal drugs. Some items may interact with your medicine. What should I  watch for while using this medication? Your condition will be monitored carefully while you are receiving this medication. This medication can cause serious allergic reactions. To reduce your risk, your care team may give you other medication to take before receiving this one. Be sure to follow the directions from your care team. This medication can  affect the results of blood tests to match your blood type. These changes can last for up to 6 months after the final dose. Your care team will do blood tests to match your blood type before you start treatment. Tell all of your care team that you are being treated with this medication before receiving a blood transfusion. This medication can affect the results of some tests used to determine treatment response; extra tests may be needed to evaluate response. Talk to your care team if you wish to become pregnant or think you are pregnant. This medication can cause serious birth defects if taken during pregnancy and for 3 months after the last dose. A reliable form of contraception is recommended while taking this medication and for 3 months after the last dose. Talk to your care team about effective forms of contraception. Do not breast-feed while taking this medication. What side effects may I notice from receiving this medication? Side effects that you should report to your care team as soon as possible: Allergic reactions--skin rash, itching, hives, swelling of the face, lips, tongue, or throat Heart rhythm changes--fast or irregular heartbeat, dizziness, feeling faint or lightheaded, chest pain, trouble breathing Infection--fever, chills, cough, sore throat, wounds that don't heal, pain or trouble when passing urine, general feeling of discomfort or being unwell Infusion reactions--chest pain, shortness of breath or trouble breathing, feeling faint or lightheaded Sudden eye pain or change in vision such as blurry vision, seeing halos around lights, vision loss Unusual bruising or bleeding Side effects that usually do not require medical attention (report to your care team if they continue or are bothersome): Constipation Diarrhea Fatigue Nausea Pain, tingling, or numbness in the hands or feet Swelling of the ankles, hands, or feet This list may not describe all possible side effects. Call your  doctor for medical advice about side effects. You may report side effects to FDA at 1-800-FDA-1088. Where should I keep my medication? This medication is given in a hospital or clinic. It will not be stored at home. NOTE: This sheet is a summary. It may not cover all possible information. If you have questions about this medicine, talk to your doctor, pharmacist, or health care provider.  2024 Elsevier/Gold Standard (2021-11-07 00:00:00)  BELOW ARE SYMPTOMS THAT SHOULD BE REPORTED IMMEDIATELY: *FEVER GREATER THAN 100.4 F (38 C) OR HIGHER *CHILLS OR SWEATING *NAUSEA AND VOMITING THAT IS NOT CONTROLLED WITH YOUR NAUSEA MEDICATION *UNUSUAL SHORTNESS OF BREATH *UNUSUAL BRUISING OR BLEEDING *URINARY PROBLEMS (pain or burning when urinating, or frequent urination) *BOWEL PROBLEMS (unusual diarrhea, constipation, pain near the anus) TENDERNESS IN MOUTH AND THROAT WITH OR WITHOUT PRESENCE OF ULCERS (sore throat, sores in mouth, or a toothache) UNUSUAL RASH, SWELLING OR PAIN  UNUSUAL VAGINAL DISCHARGE OR ITCHING   Items with * indicate a potential emergency and should be followed up as soon as possible or go to the Emergency Department if any problems should occur.  Please show the CHEMOTHERAPY ALERT CARD or IMMUNOTHERAPY ALERT CARD at check-in to the Emergency Department and triage nurse.  Should you have questions after your visit or need to cancel or reschedule your appointment, please contact [facility]  [facility] [place] - A DEPT OF [facility] [phone]  and follow the prompts.  Office hours are 8:00 a.m. to 4:30 p.m. Monday - Friday. Please note that voicemails left after 4:00 p.m. may not be returned until the following business day.  We are closed weekends and major holidays. You have access to a nurse at all times for urgent questions. Please call the main number to the clinic [phone] and follow the prompts.  For any non-urgent questions, you may also contact your  provider using MyChart. We now offer e-Visits for anyone 5 and older to request care online for non-urgent symptoms. For details visit [email or web address].   Also download the MyChart app! Go to the app store, search "MyChart", open the app, select [facility], and log in with your MyChart username and password.

## 2023-12-05 NOTE — Progress Notes (Addendum)
 Patient presents today for Darzalex  Faspro infusion and Xgeva  injection. Patient is in satisfactory condition with no new complaints voiced.  Vital signs are stable.  Labs reviewed by Dr. Cheree Cords during the office visit and all labs are within treatment parameters.  We will proceed with treatment per MD orders.   Patient denies any tooth or jaw pain and no recent or future major dental work appointment at this time. Patient reports taking Calcium /Vit D supplements as directed. Patient received his last Darazalex injection today.  Patient will be doing maintenance with Revlimid .  Treatment given today per MD orders. Tolerated infusion without adverse affects. Vital signs stable. No complaints at this time. Discharged from clinic ambulatory in stable condition. Alert and oriented x 3. F/U with [facility] as scheduled.

## 2023-12-05 NOTE — Progress Notes (Signed)
 Patient has been examined by Dr. Ellin Saba. Vital signs and labs have been reviewed by MD - ANC, Creatinine, LFTs, hemoglobin, and platelets are within treatment parameters per M.D. - pt may proceed with treatment.  Primary RN and pharmacy notified.

## 2023-12-05 NOTE — Progress Notes (Signed)
 [facility] [address] [place] [address]    Clinic Day:  12/05/2023  Referring physician: Paulett Boros, MD  Patient Care Team: Paulett Boros, MD as PCP - General (Hematology) Paulett Boros, MD as Medical Oncologist (Oncology)   ASSESSMENT & PLAN:   Assessment: 1.  IgG kappa light chain multiple myeloma: - Admission with hypercalcemia and renal failure. - CT CAP showed extensive lytic foci throughout the axial and appendicular skeleton of the chest, abdomen and pelvis.  Splenomegaly with an enlarged lobular spleen, nonspecific. - Bone marrow biopsy on 11/01/2020 shows 85% atypical plasma cells in the aspirate. - FISH panel positive for 1 p-,-13,14q-,16q-,17p-/-17 and 20q- - Cytogenetics-no metaphases available for analysis. - SPEP with 5.4 g of M spike.  Free light chain ratio 1030.  LDH normal.  Beta-2 microglobulin 18.7.  24-hour urine total protein 3.6 g. - 6 cycles of Dara VRD from 11/21/2020 through 03/07/2021. - Patient declined bone marrow transplant. - Maintenance Revlimid  and monthly daratumumab  started on 03/27/2021.   2.  Social/family history: - He worked as a Naval architect.  He lives by himself. - Mother with breast and ovarian cancer.  Father had metastatic kidney cancer.  His brother's daughter has CML.    Plan: 1.  IgG kappa light chain multiple myeloma, high risk: - He is tolerating Revlimid  and Darzalex  very well. - Myeloma labs from 11/28/2023: M spike is undetected.  Immunofixation was normal.  Kappa light chains are 7.4, lambda light chains 3.5 and ratio 2.11. - He has completed slightly more than 2 years of maintenance Darzalex .  His myeloma panel is remaining stable.  Hence I will discontinue Darzalex .  He will continue Revlimid  10 mg 3 weeks on/1 week off. - RTC 3 months for follow-up with repeat myeloma labs.   2.  Myeloma bone disease: - Continue calcium  and vitamin D  supplements.  Continue monthly  denosumab .  3.  ID prophylaxis: - Continue acyclovir  twice daily.  Continue aspirin  81 mg daily.    No orders of the defined types were placed in this encounter.     Nadeen Augusta Teague,acting as a Neurosurgeon for Paulett Boros, MD.,have documented all relevant documentation on the behalf of Paulett Boros, MD,as directed by  Paulett Boros, MD while in the presence of Paulett Boros, MD.  I, Paulett Boros MD, have reviewed the above documentation for accuracy and completeness, and I agree with the above.      [address] [place]   5/22/20257:44 AM  CHIEF COMPLAINT:   Diagnosis: multiple myeloma    Cancer Staging  No matching staging information was found for the patient.    Prior Therapy: Dara CyBorD   Current Therapy:  Maintenance Revlimid  and daratumumab     HISTORY OF PRESENT ILLNESS:   Oncology History  Multiple myeloma not having achieved remission (HCC)  11/01/2020 Initial Diagnosis   Multiple myeloma not having achieved remission (HCC)   11/02/2020 - 11/02/2020 Chemotherapy         11/16/2020 -  Chemotherapy   Patient is on Treatment Plan : MYELOMA NEWLY DIAGNOSED TRANSPLANT CANDIDATE DaraVRd (Daratumumab  SQ) q21d x 6 Cycles (Induction/Consolidation)     11/21/2020 - 02/27/2022 Chemotherapy   Patient is on Treatment Plan : MYELOMA NEWLY DIAGNOSED TRANSPLANT CANDIDATE DaraVRd (Daratumumab  SQ) q21d x 6 Cycles (Induction/Consolidation)     04/24/2021 - 04/24/2021 Chemotherapy   Patient is on Treatment Plan : MYELOMA NEWLY DIAGNOSED TRANSPLANT CANDIDATE Daratumumab  SQ + Lenalidomide  q28d (Maintenance)  INTERVAL HISTORY:   Paul Norton is a 68 y.o. male presenting to clinic today for follow up of multiple myeloma. He was last seen by me on 09/12/23.  Today, he states that he is doing well overall. His appetite level is at 100%. His energy level is at 100%.    PAST MEDICAL HISTORY:   Past Medical History: Past Medical History:  Diagnosis  Date   Broken leg    Colonoscopy refused 09/2017   Hyperlipidemia    Hypertension    Multiple myeloma (HCC)    Pneumonia    Refuses treatment 09/2017   refuses screenings such as cancer screening, refuses vaccines, refuses normal preventative care   Vaccine refused by patient    all vaccines as of 09/2017    Surgical History: Past Surgical History:  Procedure Laterality Date   COLONOSCOPY     never, declines as of 09/2017    Social History: Social History   Socioeconomic History   Marital status: Divorced    Spouse name: Not on file   Number of children: 1   Years of education: Not on file   Highest education level: Not on file  Occupational History   Occupation: Disability  Tobacco Use   Smoking status: Never   Smokeless tobacco: Never  Substance and Sexual Activity   Alcohol use: No   Drug use: Never   Sexual activity: Not Currently  Other Topics Concern   Not on file  Social History Narrative   Not on file   Social Drivers of Health   Financial Resource Strain: Medium Risk (11/15/2020)   Overall Financial Resource Strain (CARDIA)    Difficulty of Paying Living Expenses: Somewhat hard  Food Insecurity: No Food Insecurity (11/15/2020)   Hunger Vital Sign    Worried About Running Out of Food in the Last Year: Never true    Ran Out of Food in the Last Year: Never true  Transportation Needs: No Transportation Needs (11/15/2020)   PRAPARE - Administrator, Civil Service (Medical): No    Lack of Transportation (Non-Medical): No  Physical Activity: Insufficiently Active (11/21/2020)   Exercise Vital Sign    Days of Exercise per Week: 5 days    Minutes of Exercise per Session: 20 min  Stress: No Stress Concern Present (11/21/2020)   [employer] of Occupational Health - Occupational Stress Questionnaire    Feeling of Stress : Not at all  Social Connections: Socially Isolated (11/21/2020)   Social Connection and Isolation Panel [NHANES]    Frequency of  Communication with Friends and Family: More than three times a week    Frequency of Social Gatherings with Friends and Family: More than three times a week    Attends Religious Services: Never    Database administrator or Organizations: No    Attends Banker Meetings: Never    Marital Status: Divorced  Catering manager Violence: Not At Risk (11/21/2020)   Humiliation, Afraid, Rape, and Kick questionnaire    Fear of Current or Ex-Partner: No    Emotionally Abused: No    Physically Abused: No    Sexually Abused: No    Family History: No family history on file.  Current Medications:  Current Outpatient Medications:    acetaminophen  (TYLENOL ) 325 MG tablet, Take 2 tablets (650 mg total) by mouth every 6 (six) hours as needed for mild pain (or Fever >/= 101)., Disp: 30 tablet, Rfl: 30   acyclovir  (ZOVIRAX ) 400 MG tablet, Take 1 tablet (  400 mg total) by mouth 2 (two) times daily., Disp: 60 tablet, Rfl: 6   amLODipine  (NORVASC ) 10 MG tablet, TAKE 1 TABLET BY MOUTH DAILY, Disp: 90 tablet, Rfl: 3   aspirin  81 MG chewable tablet, Chew by mouth daily., Disp: , Rfl:    feeding supplement (ENSURE ENLIVE / ENSURE PLUS) LIQD, Take 237 mLs by mouth 3 (three) times daily between meals., Disp: 237 mL, Rfl: 12   OYSCO 500 + D 500-5 MG-MCG TABS, TAKE ONE TABLET BY MOUTH DAILY, Disp: 30 tablet, Rfl: 3   REVLIMID  10 MG capsule, Take 1 capsule (10 mg total) by mouth daily. 21 days on, 7 days off, Disp: 21 capsule, Rfl: 0   Allergies: No Known Allergies  REVIEW OF SYSTEMS:   Review of Systems  Constitutional:  Negative for chills, fatigue and fever.  HENT:   Negative for lump/mass, mouth sores, nosebleeds, sore throat and trouble swallowing.   Eyes:  Negative for eye problems.  Respiratory:  Negative for cough and shortness of breath.   Cardiovascular:  Negative for chest pain, leg swelling and palpitations.  Gastrointestinal:  Negative for abdominal pain, constipation, diarrhea, nausea and  vomiting.  Genitourinary:  Negative for bladder incontinence, difficulty urinating, dysuria, frequency, hematuria and nocturia.   Musculoskeletal:  Negative for arthralgias, back pain, flank pain, myalgias and neck pain.  Skin:  Negative for itching and rash.  Neurological:  Negative for dizziness, headaches and numbness.  Hematological:  Does not bruise/bleed easily.  Psychiatric/Behavioral:  Negative for depression, sleep disturbance and suicidal ideas. The patient is not nervous/anxious.   All other systems reviewed and are negative.    VITALS:   There were no vitals taken for this visit.  Wt Readings from Last 3 Encounters:  10/10/23 188 lb 8 oz (85.5 kg)  09/12/23 190 lb 7.6 oz (86.4 kg)  08/15/23 191 lb 3.2 oz (86.7 kg)    There is no height or weight on file to calculate BMI.  Performance status (ECOG): 1 - Symptomatic but completely ambulatory  PHYSICAL EXAM:   Physical Exam Vitals and nursing note reviewed. Exam conducted with a chaperone present.  Constitutional:      Appearance: Normal appearance.  Cardiovascular:     Rate and Rhythm: Normal rate and regular rhythm.     Pulses: Normal pulses.     Heart sounds: Normal heart sounds.  Pulmonary:     Effort: Pulmonary effort is normal.     Breath sounds: Normal breath sounds.  Abdominal:     Palpations: Abdomen is soft. There is no hepatomegaly, splenomegaly or mass.     Tenderness: There is no abdominal tenderness.  Musculoskeletal:     Right lower leg: No edema.     Left lower leg: No edema.  Lymphadenopathy:     Cervical: No cervical adenopathy.     Right cervical: No superficial, deep or posterior cervical adenopathy.    Left cervical: No superficial, deep or posterior cervical adenopathy.     Upper Body:     Right upper body: No supraclavicular or axillary adenopathy.     Left upper body: No supraclavicular or axillary adenopathy.  Neurological:     General: No focal deficit present.     Mental Status: He  is alert and oriented to person, place, and time.  Psychiatric:        Mood and Affect: Mood normal.        Behavior: Behavior normal.    LABS:      Latest  Ref Rng & Units 11/28/2023   10:48 AM 11/07/2023    1:35 PM 10/10/2023   12:44 PM  CBC  WBC 4.0 - 10.5 K/uL 7.6  8.3  7.5   Hemoglobin 13.0 - 17.0 g/dL 16.1  09.6  04.5   Hematocrit 39.0 - 52.0 % 44.3  43.6  43.7   Platelets 150 - 400 K/uL 196  162  162       Latest Ref Rng & Units 11/28/2023   10:48 AM 11/07/2023    1:35 PM 10/10/2023   12:44 PM  CMP  Glucose 70 - 99 mg/dL 409  811  914   BUN 8 - 23 mg/dL 25  30  24    Creatinine 0.61 - 1.24 mg/dL 7.82  9.56  2.13   Sodium 135 - 145 mmol/L 135  135  137   Potassium 3.5 - 5.1 mmol/L 4.0  3.9  4.0   Chloride 98 - 111 mmol/L 100  98  98   CO2 22 - 32 mmol/L 24  27  28    Calcium  8.9 - 10.3 mg/dL 8.9  9.1  8.5   Total Protein 6.5 - 8.1 g/dL 6.6  6.2  6.3   Total Bilirubin 0.0 - 1.2 mg/dL 1.2  1.6  1.6   Alkaline Phos 38 - 126 U/L 66  61  60   AST 15 - 41 U/L 17  18  17    ALT 0 - 44 U/L 14  16  15       Lab Results  Component Value Date   CEA1 1.1 10/30/2020   /  CEA  Date Value Ref Range Status  10/30/2020 1.1 0.0 - 4.7 ng/mL Final    Comment:    (NOTE)                             Nonsmokers          <3.9                             Smokers             <5.6 Roche [employer]ostics Electrochemiluminescence Immunoassay (ECLIA) Values obtained with different assay methods or kits cannot be used interchangeably.  Results cannot be interpreted as absolute evidence of the presence or absence of malignant disease. Performed At: [facility] [address] [place], [place] [id number] Pearlean Botts MD GE:[id number]    No results found for: "PSA1" No results found for: "CAN199" No results found for: "CAN125"  Lab Results  Component Value Date   TOTALPROTELP 5.9 (L) 11/28/2023   ALBUMINELP 3.7 11/28/2023   A1GS 0.3 11/28/2023   A2GS 0.7 11/28/2023   BETS 0.9  11/28/2023   GAMS 0.2 (L) 11/28/2023   MSPIKE Not Observed 11/28/2023   SPEI Comment 11/28/2023   Lab Results  Component Value Date   TIBC 242 (L) 10/30/2020   TIBC 266 10/29/2020   FERRITIN 1,406 (H) 10/30/2020   FERRITIN 627 (H) 10/29/2020   IRONPCTSAT 42 (H) 10/30/2020   IRONPCTSAT 47 (H) 10/29/2020   Lab Results  Component Value Date   LDH 113 01/30/2022   LDH 107 01/02/2022   LDH 135 06/19/2021     STUDIES:   No results found.

## 2023-12-05 NOTE — Patient Instructions (Signed)
 [facility] at [facility] Discharge Instructions   You were seen and examined today by Dr. Cheree Cords.  He reviewed the results of your lab work which are normal/stable.   We will proceed with your treatment today. After today, we will discontinue the Darzalex  injection you receive once a month since your myeloma numbers continue to be normal. You would continue taking the Revlimid  pill at home as you have been for maintenance therapy. We will continue giving the Xgeva  (shot for bone strength) once a month   Return as scheduled.    Thank you for choosing [facility] at [facility] to provide your oncology and hematology care.  To afford each patient quality time with our provider, please arrive at least 15 minutes before your scheduled appointment time.   If you have a lab appointment with the Cancer Center please come in thru the Main Entrance and check in at the main information desk.  You need to re-schedule your appointment should you arrive 10 or more minutes late.  We strive to give you quality time with our providers, and arriving late affects you and other patients whose appointments are after yours.  Also, if you no show three or more times for appointments you may be dismissed from the clinic at the providers discretion.     Again, thank you for choosing [facility].  Our hope is that these requests will decrease the amount of time that you wait before being seen by our physicians.       _____________________________________________________________  Should you have questions after your visit to [facility], please contact our office at [phone] and follow the prompts.  Our office hours are 8:00 a.m. and 4:30 p.m. Monday - Friday.  Please note that voicemails left after 4:00 p.m. may not be returned until the following business day.  We are closed weekends and major holidays.  You do have access to a nurse  24-7, just call the main number to the clinic [phone] and do not press any options, hold on the line and a nurse will answer the phone.    For prescription refill requests, have your pharmacy contact our office and allow 72 hours.    Due to Covid, you will need to wear a mask upon entering the hospital. If you do not have a mask, a mask will be given to you at the Main Entrance upon arrival. For doctor visits, patients may have 1 support person age 30 or older with them. For treatment visits, patients can not have anyone with them due to social distancing guidelines and our immunocompromised population.

## 2023-12-06 ENCOUNTER — Other Ambulatory Visit: Payer: Self-pay

## 2023-12-19 ENCOUNTER — Other Ambulatory Visit: Payer: Self-pay | Admitting: *Deleted

## 2023-12-25 ENCOUNTER — Other Ambulatory Visit: Payer: Self-pay

## 2024-01-01 ENCOUNTER — Other Ambulatory Visit: Payer: Self-pay

## 2024-01-01 DIAGNOSIS — C9000 Multiple myeloma not having achieved remission: Secondary | ICD-10-CM

## 2024-01-02 ENCOUNTER — Inpatient Hospital Stay: Attending: Hematology

## 2024-01-02 ENCOUNTER — Inpatient Hospital Stay

## 2024-01-02 VITALS — BP 173/74 | HR 62 | Temp 96.7°F | Resp 20 | Wt 188.3 lb

## 2024-01-02 DIAGNOSIS — C9000 Multiple myeloma not having achieved remission: Secondary | ICD-10-CM | POA: Diagnosis not present

## 2024-01-02 LAB — COMPREHENSIVE METABOLIC PANEL WITH GFR
ALT: 16 U/L (ref 0–44)
AST: 16 U/L (ref 15–41)
Albumin: 3.8 g/dL (ref 3.5–5.0)
Alkaline Phosphatase: 66 U/L (ref 38–126)
Anion gap: 11 (ref 5–15)
BUN: 26 mg/dL — ABNORMAL HIGH (ref 8–23)
CO2: 26 mmol/L (ref 22–32)
Calcium: 8.4 mg/dL — ABNORMAL LOW (ref 8.9–10.3)
Chloride: 101 mmol/L (ref 98–111)
Creatinine, Ser: 1.21 mg/dL (ref 0.61–1.24)
GFR, Estimated: >60 mL/min (ref >60)
Glucose, Bld: 120 mg/dL — ABNORMAL HIGH (ref 70–99)
Potassium: 4 mmol/L (ref 3.5–5.1)
Sodium: 138 mmol/L (ref 135–145)
Total Bilirubin: 2.2 mg/dL — ABNORMAL HIGH (ref 0.0–1.2)
Total Protein: 6.2 g/dL — ABNORMAL LOW (ref 6.5–8.1)

## 2024-01-02 MED ORDER — DENOSUMAB 120 MG/1.7ML ~~LOC~~ SOLN
120.0000 mg | Freq: Once | SUBCUTANEOUS | Status: AC
  Administered 2024-01-02: 120 mg via SUBCUTANEOUS
  Filled 2024-01-02: qty 1.7

## 2024-01-02 NOTE — Patient Instructions (Signed)
 [facility]  - A DEPT OF [facility]  Discharge Instructions: Thank you for choosing [facility] to provide your oncology and hematology care.  If you have a lab appointment with the Cancer Center - please note that after April 8th, 2024, all labs will be drawn in the cancer center.  You do not have to check in or register with the main entrance as you have in the past but will complete your check-in in the cancer center.  Wear comfortable clothing and clothing appropriate for easy access to any Portacath or PICC line.   We strive to give you quality time with your provider. You may need to reschedule your appointment if you arrive late (15 or more minutes).  Arriving late affects you and other patients whose appointments are after yours.  Also, if you miss three or more appointments without notifying the office, you may be dismissed from the clinic at the provider's discretion.      For prescription refill requests, have your pharmacy contact our office and allow 72 hours for refills to be completed.    Today you received the following chemotherapy and/or immunotherapy agents Xgeva Denosumab Injection (Oncology) What is this medication? DENOSUMAB (den oh SUE mab) prevents weakened bones caused by cancer. It may also be used to treat noncancerous bone tumors that cannot be removed by surgery. It can also be used to treat high calcium levels in the blood caused by cancer. It works by blocking a protein that causes bones to break down quickly. This slows down the release of calcium from bones, which lowers calcium levels in your blood. It also makes your bones stronger and less likely to break (fracture). This medicine may be used for other purposes; ask your health care provider or pharmacist if you have questions. COMMON BRAND NAME(S): XGEVA What should I tell my care team before I take this medication? They need to know if you have any of these  conditions: Dental disease Having surgery or tooth extraction Infection Kidney disease Low levels of calcium or vitamin D in the blood Malnutrition On hemodialysis Skin conditions or sensitivity Thyroid or parathyroid disease An unusual reaction to denosumab, other medications, foods, dyes, or preservatives Pregnant or trying to get pregnant Breast-feeding How should I use this medication? This medication is for injection under the skin. It is given by your care team in a hospital or clinic setting. A special MedGuide will be given to you before each treatment. Be sure to read this information carefully each time. Talk to your care team about the use of this medication in children. While it may be prescribed for children as young as 13 years for selected conditions, precautions do apply. Overdosage: If you think you have taken too much of this medicine contact a poison control center or emergency room at once. NOTE: This medicine is only for you. Do not share this medicine with others. What if I miss a dose? Keep appointments for follow-up doses. It is important not to miss your dose. Call your care team if you are unable to keep an appointment. What may interact with this medication? Do not take this medication with any of the following: Other medications containing denosumab This medication may also interact with the following: Medications that lower your chance of fighting infection Steroid medications, such as prednisone or cortisone This list may not describe all possible interactions. Give your health care provider a list of all the medicines,  herbs, non-prescription drugs, or dietary supplements you use. Also tell them if you smoke, drink alcohol, or use illegal drugs. Some items may interact with your medicine. What should I watch for while using this medication? Your condition will be monitored carefully while you are receiving this medication. You may need blood work while  taking this medication. This medication may increase your risk of getting an infection. Call your care team for advice if you get a fever, chills, sore throat, or other symptoms of a cold or flu. Do not treat yourself. Try to avoid being around people who are sick. You should make sure you get enough calcium and vitamin D while you are taking this medication, unless your care team tells you not to. Discuss the foods you eat and the vitamins you take with your care team. Some people who take this medication have severe bone, joint, or muscle pain. This medication may also increase your risk for jaw problems or a broken thigh bone. Tell your care team right away if you have severe pain in your jaw, bones, joints, or muscles. Tell your care team if you have any pain that does not go away or that gets worse. Talk to your care team if you may be pregnant. Serious birth defects can occur if you take this medication during pregnancy and for 5 months after the last dose. You will need a negative pregnancy test before starting this medication. Contraception is recommended while taking this medication and for 5 months after the last dose. Your care team can help you find the option that works for you. What side effects may I notice from receiving this medication? Side effects that you should report to your care team as soon as possible: Allergic reactions--skin rash, itching, hives, swelling of the face, lips, tongue, or throat Bone, joint, or muscle pain Low calcium level--muscle pain or cramps, confusion, tingling, or numbness in the hands or feet Osteonecrosis of the jaw--pain, swelling, or redness in the mouth, numbness of the jaw, poor healing after dental work, unusual discharge from the mouth, visible bones in the mouth Side effects that usually do not require medical attention (report to your care team if they continue or are bothersome): Cough Diarrhea Fatigue Headache Nausea This list may not  describe all possible side effects. Call your doctor for medical advice about side effects. You may report side effects to FDA at 1-800-FDA-1088. Where should I keep my medication? This medication is given in a hospital or clinic. It will not be stored at home. NOTE: This sheet is a summary. It may not cover all possible information. If you have questions about this medicine, talk to your doctor, pharmacist, or health care provider.  2024 Elsevier/Gold Standard (2021-11-22 00:00:00)      To help prevent nausea and vomiting after your treatment, we encourage you to take your nausea medication as directed.  BELOW ARE SYMPTOMS THAT SHOULD BE REPORTED IMMEDIATELY: *FEVER GREATER THAN 100.4 F (38 C) OR HIGHER *CHILLS OR SWEATING *NAUSEA AND VOMITING THAT IS NOT CONTROLLED WITH YOUR NAUSEA MEDICATION *UNUSUAL SHORTNESS OF BREATH *UNUSUAL BRUISING OR BLEEDING *URINARY PROBLEMS (pain or burning when urinating, or frequent urination) *BOWEL PROBLEMS (unusual diarrhea, constipation, pain near the anus) TENDERNESS IN MOUTH AND THROAT WITH OR WITHOUT PRESENCE OF ULCERS (sore throat, sores in mouth, or a toothache) UNUSUAL RASH, SWELLING OR PAIN  UNUSUAL VAGINAL DISCHARGE OR ITCHING   Items with * indicate a potential emergency and should be followed up as soon  as possible or go to the Emergency Department if any problems should occur.  Please show the CHEMOTHERAPY ALERT CARD or IMMUNOTHERAPY ALERT CARD at check-in to the Emergency Department and triage nurse.  Should you have questions after your visit or need to cancel or reschedule your appointment, please contact [facility] [place] - A DEPT OF [facility] [phone]  and follow the prompts.  Office hours are 8:00 a.m. to 4:30 p.m. Monday - Friday. Please note that voicemails left after 4:00 p.m. may not be returned until the following business day.  We are closed weekends and major holidays. You have access to a nurse at  all times for urgent questions. Please call the main number to the clinic [phone] and follow the prompts.  For any non-urgent questions, you may also contact your provider using MyChart. We now offer e-Visits for anyone 2 and older to request care online for non-urgent symptoms. For details visit [email or web address].   Also download the MyChart app! Go to the app store, search "MyChart", open the app, select [place], and log in with your MyChart username and password.

## 2024-01-02 NOTE — Progress Notes (Signed)
 Calcium  8.4. Creatinine 1.21.   Patient taking Oysco 500 + D500 -5 mg - Mcg tabs. Patient states he is taking Revlimid  at home as prescribed with no side effects.   Paul Norton presents today for injection per the provider's orders.  Xgeva  administration without incident; injection site WNL; see MAR for injection details.  Patient tolerated procedure well and without incident.  No questions or complaints noted at this time.  Xgeva  given today per MD orders. Discharged from clinic ambulatory in stable condition. Alert and oriented x 3. F/U with [facility] as scheduled.

## 2024-01-09 ENCOUNTER — Other Ambulatory Visit: Payer: Self-pay | Admitting: *Deleted

## 2024-01-09 MED ORDER — REVLIMID 10 MG PO CAPS: 10.0000 mg | ORAL_CAPSULE | Freq: Every day | ORAL | 0 refills | Status: DC

## 2024-01-29 ENCOUNTER — Other Ambulatory Visit: Payer: Self-pay

## 2024-01-29 DIAGNOSIS — C9000 Multiple myeloma not having achieved remission: Secondary | ICD-10-CM

## 2024-01-30 ENCOUNTER — Inpatient Hospital Stay: Attending: Hematology

## 2024-01-30 ENCOUNTER — Inpatient Hospital Stay

## 2024-01-30 VITALS — BP 166/69 | HR 55 | Temp 96.8°F | Resp 20 | Wt 185.8 lb

## 2024-01-30 DIAGNOSIS — C9000 Multiple myeloma not having achieved remission: Secondary | ICD-10-CM | POA: Diagnosis not present

## 2024-01-30 LAB — COMPREHENSIVE METABOLIC PANEL WITH GFR
ALT: 16 U/L (ref 0–44)
AST: 17 U/L (ref 15–41)
Albumin: 3.9 g/dL (ref 3.5–5.0)
Alkaline Phosphatase: 67 U/L (ref 38–126)
Anion gap: 11 (ref 5–15)
BUN: 27 mg/dL — ABNORMAL HIGH (ref 8–23)
CO2: 27 mmol/L (ref 22–32)
Calcium: 8.5 mg/dL — ABNORMAL LOW (ref 8.9–10.3)
Chloride: 101 mmol/L (ref 98–111)
Creatinine, Ser: 1.28 mg/dL — ABNORMAL HIGH (ref 0.61–1.24)
GFR, Estimated: >60 mL/min (ref >60)
Glucose, Bld: 121 mg/dL — ABNORMAL HIGH (ref 70–99)
Potassium: 4.2 mmol/L (ref 3.5–5.1)
Sodium: 139 mmol/L (ref 135–145)
Total Bilirubin: 2.1 mg/dL — ABNORMAL HIGH (ref 0.0–1.2)
Total Protein: 6.4 g/dL — ABNORMAL LOW (ref 6.5–8.1)

## 2024-01-30 MED ORDER — DENOSUMAB 120 MG/1.7ML ~~LOC~~ SOLN
120.0000 mg | Freq: Once | SUBCUTANEOUS | Status: AC
Start: 2024-01-30 — End: 2024-01-30
  Administered 2024-01-30: 120 mg via SUBCUTANEOUS
  Filled 2024-01-30: qty 1.7

## 2024-01-30 NOTE — Progress Notes (Signed)
 Patient presents today for Xgeva  injection. Calcium  8.5. Serum creatinine 1.28. patient taking OYSCO 500 + D 500-5 MG-MCG tablet daily. Patient taking REVLIMID  as prescribed with no side effects noted.   Xgeva  given today per MD orders. Tolerated without adverse affects. Vital signs stable. No complaints at this time. Discharged from clinic ambulatory in stable condition. Alert and oriented x 3. F/U with [facility] as scheduled.

## 2024-01-30 NOTE — Patient Instructions (Signed)
 [facility]  - A DEPT OF [facility]  Discharge Instructions: Thank you for choosing [facility] to provide your oncology and hematology care.  If you have a lab appointment with the Cancer Center - please note that after April 8th, 2024, all labs will be drawn in the cancer center.  You do not have to check in or register with the main entrance as you have in the past but will complete your check-in in the cancer center.  Wear comfortable clothing and clothing appropriate for easy access to any Portacath or PICC line.   We strive to give you quality time with your provider. You may need to reschedule your appointment if you arrive late (15 or more minutes).  Arriving late affects you and other patients whose appointments are after yours.  Also, if you miss three or more appointments without notifying the office, you may be dismissed from the clinic at the provider's discretion.      For prescription refill requests, have your pharmacy contact our office and allow 72 hours for refills to be completed.    Today you received the following chemotherapy and/or immunotherapy agents Xgeva Denosumab Injection (Oncology) What is this medication? DENOSUMAB (den oh SUE mab) prevents weakened bones caused by cancer. It may also be used to treat noncancerous bone tumors that cannot be removed by surgery. It can also be used to treat high calcium levels in the blood caused by cancer. It works by blocking a protein that causes bones to break down quickly. This slows down the release of calcium from bones, which lowers calcium levels in your blood. It also makes your bones stronger and less likely to break (fracture). This medicine may be used for other purposes; ask your health care provider or pharmacist if you have questions. COMMON BRAND NAME(S): XGEVA What should I tell my care team before I take this medication? They need to know if you have any of these  conditions: Dental disease Having surgery or tooth extraction Infection Kidney disease Low levels of calcium or vitamin D in the blood Malnutrition On hemodialysis Skin conditions or sensitivity Thyroid or parathyroid disease An unusual reaction to denosumab, other medications, foods, dyes, or preservatives Pregnant or trying to get pregnant Breast-feeding How should I use this medication? This medication is for injection under the skin. It is given by your care team in a hospital or clinic setting. A special MedGuide will be given to you before each treatment. Be sure to read this information carefully each time. Talk to your care team about the use of this medication in children. While it may be prescribed for children as young as 13 years for selected conditions, precautions do apply. Overdosage: If you think you have taken too much of this medicine contact a poison control center or emergency room at once. NOTE: This medicine is only for you. Do not share this medicine with others. What if I miss a dose? Keep appointments for follow-up doses. It is important not to miss your dose. Call your care team if you are unable to keep an appointment. What may interact with this medication? Do not take this medication with any of the following: Other medications containing denosumab This medication may also interact with the following: Medications that lower your chance of fighting infection Steroid medications, such as prednisone or cortisone This list may not describe all possible interactions. Give your health care provider a list of all the medicines,  herbs, non-prescription drugs, or dietary supplements you use. Also tell them if you smoke, drink alcohol, or use illegal drugs. Some items may interact with your medicine. What should I watch for while using this medication? Your condition will be monitored carefully while you are receiving this medication. You may need blood work while  taking this medication. This medication may increase your risk of getting an infection. Call your care team for advice if you get a fever, chills, sore throat, or other symptoms of a cold or flu. Do not treat yourself. Try to avoid being around people who are sick. You should make sure you get enough calcium and vitamin D while you are taking this medication, unless your care team tells you not to. Discuss the foods you eat and the vitamins you take with your care team. Some people who take this medication have severe bone, joint, or muscle pain. This medication may also increase your risk for jaw problems or a broken thigh bone. Tell your care team right away if you have severe pain in your jaw, bones, joints, or muscles. Tell your care team if you have any pain that does not go away or that gets worse. Talk to your care team if you may be pregnant. Serious birth defects can occur if you take this medication during pregnancy and for 5 months after the last dose. You will need a negative pregnancy test before starting this medication. Contraception is recommended while taking this medication and for 5 months after the last dose. Your care team can help you find the option that works for you. What side effects may I notice from receiving this medication? Side effects that you should report to your care team as soon as possible: Allergic reactions--skin rash, itching, hives, swelling of the face, lips, tongue, or throat Bone, joint, or muscle pain Low calcium level--muscle pain or cramps, confusion, tingling, or numbness in the hands or feet Osteonecrosis of the jaw--pain, swelling, or redness in the mouth, numbness of the jaw, poor healing after dental work, unusual discharge from the mouth, visible bones in the mouth Side effects that usually do not require medical attention (report to your care team if they continue or are bothersome): Cough Diarrhea Fatigue Headache Nausea This list may not  describe all possible side effects. Call your doctor for medical advice about side effects. You may report side effects to FDA at 1-800-FDA-1088. Where should I keep my medication? This medication is given in a hospital or clinic. It will not be stored at home. NOTE: This sheet is a summary. It may not cover all possible information. If you have questions about this medicine, talk to your doctor, pharmacist, or health care provider.  2024 Elsevier/Gold Standard (2021-11-22 00:00:00)      To help prevent nausea and vomiting after your treatment, we encourage you to take your nausea medication as directed.  BELOW ARE SYMPTOMS THAT SHOULD BE REPORTED IMMEDIATELY: *FEVER GREATER THAN 100.4 F (38 C) OR HIGHER *CHILLS OR SWEATING *NAUSEA AND VOMITING THAT IS NOT CONTROLLED WITH YOUR NAUSEA MEDICATION *UNUSUAL SHORTNESS OF BREATH *UNUSUAL BRUISING OR BLEEDING *URINARY PROBLEMS (pain or burning when urinating, or frequent urination) *BOWEL PROBLEMS (unusual diarrhea, constipation, pain near the anus) TENDERNESS IN MOUTH AND THROAT WITH OR WITHOUT PRESENCE OF ULCERS (sore throat, sores in mouth, or a toothache) UNUSUAL RASH, SWELLING OR PAIN  UNUSUAL VAGINAL DISCHARGE OR ITCHING   Items with * indicate a potential emergency and should be followed up as soon  as possible or go to the Emergency Department if any problems should occur.  Please show the CHEMOTHERAPY ALERT CARD or IMMUNOTHERAPY ALERT CARD at check-in to the Emergency Department and triage nurse.  Should you have questions after your visit or need to cancel or reschedule your appointment, please contact [facility] [place] - A DEPT OF [facility] [phone]  and follow the prompts.  Office hours are 8:00 a.m. to 4:30 p.m. Monday - Friday. Please note that voicemails left after 4:00 p.m. may not be returned until the following business day.  We are closed weekends and major holidays. You have access to a nurse at  all times for urgent questions. Please call the main number to the clinic [phone] and follow the prompts.  For any non-urgent questions, you may also contact your provider using MyChart. We now offer e-Visits for anyone 2 and older to request care online for non-urgent symptoms. For details visit [email or web address].   Also download the MyChart app! Go to the app store, search "MyChart", open the app, select [place], and log in with your MyChart username and password.

## 2024-02-03 ENCOUNTER — Other Ambulatory Visit: Payer: Self-pay

## 2024-02-03 MED ORDER — REVLIMID 10 MG PO CAPS: 10.0000 mg | ORAL_CAPSULE | Freq: Every day | ORAL | 0 refills | Status: DC

## 2024-02-03 NOTE — Telephone Encounter (Signed)
 Chart reviewed. Revlimid refilled per last office note with Dr. Ellin Saba.

## 2024-02-11 ENCOUNTER — Other Ambulatory Visit: Payer: Self-pay | Admitting: Hematology

## 2024-02-11 DIAGNOSIS — C9000 Multiple myeloma not having achieved remission: Secondary | ICD-10-CM

## 2024-02-12 ENCOUNTER — Encounter (HOSPITAL_COMMUNITY): Payer: Self-pay | Admitting: Hematology

## 2024-02-20 ENCOUNTER — Inpatient Hospital Stay: Attending: Hematology

## 2024-02-20 DIAGNOSIS — C9000 Multiple myeloma not having achieved remission: Secondary | ICD-10-CM | POA: Diagnosis not present

## 2024-02-20 LAB — COMPREHENSIVE METABOLIC PANEL WITH GFR
ALT: 16 U/L (ref 0–44)
AST: 17 U/L (ref 15–41)
Albumin: 3.9 g/dL (ref 3.5–5.0)
Alkaline Phosphatase: 70 U/L (ref 38–126)
Anion gap: 11 (ref 5–15)
BUN: 28 mg/dL — ABNORMAL HIGH (ref 8–23)
CO2: 24 mmol/L (ref 22–32)
Calcium: 8.5 mg/dL — ABNORMAL LOW (ref 8.9–10.3)
Chloride: 102 mmol/L (ref 98–111)
Creatinine, Ser: 0.97 mg/dL (ref 0.61–1.24)
GFR, Estimated: >60 mL/min (ref >60)
Glucose, Bld: 118 mg/dL — ABNORMAL HIGH (ref 70–99)
Potassium: 4 mmol/L (ref 3.5–5.1)
Sodium: 137 mmol/L (ref 135–145)
Total Bilirubin: 1.4 mg/dL — ABNORMAL HIGH (ref 0.0–1.2)
Total Protein: 6.4 g/dL — ABNORMAL LOW (ref 6.5–8.1)

## 2024-02-20 LAB — CBC WITH DIFFERENTIAL/PLATELET
Abs Immature Granulocytes: 0.03 K/uL (ref 0.00–0.07)
Basophils Absolute: 0.1 K/uL (ref 0.0–0.1)
Basophils Relative: 1 %
Eosinophils Absolute: 0.1 K/uL (ref 0.0–0.5)
Eosinophils Relative: 2 %
HCT: 45.2 % (ref 39.0–52.0)
Hemoglobin: 14.8 g/dL (ref 13.0–17.0)
Immature Granulocytes: 0 %
Lymphocytes Relative: 19 %
Lymphs Abs: 1.5 K/uL (ref 0.7–4.0)
MCH: 27.2 pg (ref 26.0–34.0)
MCHC: 32.7 g/dL (ref 30.0–36.0)
MCV: 82.9 fL (ref 80.0–100.0)
Monocytes Absolute: 1.2 K/uL — ABNORMAL HIGH (ref 0.1–1.0)
Monocytes Relative: 15 %
Neutro Abs: 4.7 K/uL (ref 1.7–7.7)
Neutrophils Relative %: 63 %
Platelets: 203 K/uL (ref 150–400)
RBC: 5.45 MIL/uL (ref 4.22–5.81)
RDW: 14.6 % (ref 11.5–15.5)
WBC: 7.6 K/uL (ref 4.0–10.5)
nRBC: 0 % (ref 0.0–0.2)

## 2024-02-20 LAB — MAGNESIUM: Magnesium: 2.1 mg/dL (ref 1.7–2.4)

## 2024-02-21 ENCOUNTER — Encounter: Payer: Self-pay | Admitting: Oncology

## 2024-02-21 LAB — KAPPA/LAMBDA LIGHT CHAINS
Kappa free light chain: 10 mg/L (ref 3.3–19.4)
Kappa, lambda light chain ratio: 1.56 (ref 0.26–1.65)
Lambda free light chains: 6.4 mg/L (ref 5.7–26.3)

## 2024-02-24 LAB — PROTEIN ELECTROPHORESIS, SERUM
A/G Ratio: 1.6 (ref 0.7–1.7)
Albumin ELP: 3.5 g/dL (ref 2.9–4.4)
Alpha-1-Globulin: 0.3 g/dL (ref 0.0–0.4)
Alpha-2-Globulin: 0.7 g/dL (ref 0.4–1.0)
Beta Globulin: 0.9 g/dL (ref 0.7–1.3)
Gamma Globulin: 0.3 g/dL — ABNORMAL LOW (ref 0.4–1.8)
Globulin, Total: 2.2 g/dL (ref 2.2–3.9)
Total Protein ELP: 5.7 g/dL — ABNORMAL LOW (ref 6.0–8.5)

## 2024-02-25 ENCOUNTER — Encounter (HOSPITAL_COMMUNITY): Payer: Self-pay | Admitting: Oncology

## 2024-02-25 LAB — IMMUNOFIXATION ELECTROPHORESIS
IgA: 35 mg/dL — ABNORMAL LOW (ref 61–437)
IgG (Immunoglobin G), Serum: 352 mg/dL — ABNORMAL LOW (ref 603–1613)
IgM (Immunoglobulin M), Srm: 10 mg/dL — ABNORMAL LOW (ref 20–172)
Total Protein ELP: 5.8 g/dL — ABNORMAL LOW (ref 6.0–8.5)

## 2024-02-26 NOTE — Progress Notes (Deleted)
 Patient Care Team: Rogers Hai, MD (Inactive) as PCP - General (Hematology)  Clinic Day:  02/26/2024  Referring physician: Rogers Hai, MD   CHIEF COMPLAINT:  CC: IgG kappa Multiple Myeloma in remission  Paul Norton 68 y.o. male was transferred to my care after his prior physician has left.   ASSESSMENT & PLAN:   Assessment & Plan: Paul Norton  is a 68 y.o. male with IgG kappa Multiple Myeloma in remission   Assessment & Plan     The patient understands the plans discussed today and is in agreement with them.  He knows to contact our office if he develops concerns prior to his next appointment.  *** minutes of total time was spent for this patient encounter, including preparation, face-to-face counseling with the patient and coordination of care, physical exam, and documentation of the encounter. > 50% of the time was spent on counseling as documented under my assessment and plan.    [name], MD  [facility] [place] - A DEPT OF [facility] [address] [place] [address] Dept: [phone] Dept Fax: [phone]   No orders of the defined types were placed in this encounter.    ONCOLOGY HISTORY:   I have reviewed his chart and materials related to his cancer extensively and collaborated history with the patient. Summary of oncologic history is as follows:   IgG kappa Multiple Myeloma in remission:   Current Treatment:  Revlimid  10mg  3 weeks on/1 week off  INTERVAL HISTORY:   Paul Norton is here today for follow up. Patient is accompanied by *** .     I have reviewed the past medical history, past surgical history, social history and family history with the patient and they are unchanged from previous note.  ALLERGIES:  has no known allergies.  MEDICATIONS:  Current Outpatient Medications  Medication Sig Dispense Refill   acetaminophen   (TYLENOL ) 325 MG tablet Take 2 tablets (650 mg total) by mouth every 6 (six) hours as needed for mild pain (or Fever >/= 101). 30 tablet 30   acyclovir  (ZOVIRAX ) 400 MG tablet Take 1 tablet (400 mg total) by mouth 2 (two) times daily. 60 tablet 6   amLODipine  (NORVASC ) 10 MG tablet TAKE 1 TABLET BY MOUTH DAILY 90 tablet 3   aspirin  81 MG chewable tablet Chew by mouth daily.     feeding supplement (ENSURE ENLIVE / ENSURE PLUS) LIQD Take 237 mLs by mouth 3 (three) times daily between meals. 237 mL 12   OYSCO 500 + D 500-5 MG-MCG TABS TAKE ONE TABLET BY MOUTH DAILY 30 tablet 3   REVLIMID  10 MG capsule Take 1 capsule (10 mg total) by mouth daily. 21 days on, 7 days off 21 capsule 0   No current facility-administered medications for this visit.    REVIEW OF SYSTEMS:   Constitutional: Denies fevers, chills or abnormal weight loss Eyes: Denies blurriness of vision Ears, nose, mouth, throat, and face: Denies mucositis or sore throat Respiratory: Denies cough, dyspnea or wheezes Cardiovascular: Denies palpitation, chest discomfort or lower extremity swelling Gastrointestinal:  Denies nausea, heartburn or change in bowel habits Skin: Denies abnormal skin rashes Lymphatics: Denies new lymphadenopathy or easy bruising Neurological:Denies numbness, tingling or new weaknesses Behavioral/Psych: Mood is stable, no new changes  All other systems were reviewed with the patient and are negative.   VITALS:  There were no vitals taken for this visit.  Wt Readings from  Last 3 Encounters:  01/30/24 185 lb 13.6 oz (84.3 kg)  01/02/24 188 lb 4.4 oz (85.4 kg)  12/05/23 187 lb 6.3 oz (85 kg)    There is no height or weight on file to calculate BMI.  Performance status (ECOG): {CHL ONC [id number]  PHYSICAL EXAM:   GENERAL:alert, no distress and comfortable SKIN: skin color, texture, turgor are normal, no rashes or significant lesions EYES: normal, Conjunctiva are pink and non-injected, sclera  clear OROPHARYNX:no exudate, no erythema and lips, buccal mucosa, and tongue normal  NECK: supple, thyroid normal size, non-tender, without nodularity LYMPH:  no palpable lymphadenopathy in the cervical, axillary or inguinal LUNGS: clear to auscultation and percussion with normal breathing effort HEART: regular rate & rhythm and no murmurs and no lower extremity edema ABDOMEN:abdomen soft, non-tender and normal bowel sounds Musculoskeletal:no cyanosis of digits and no clubbing  NEURO: alert & oriented x 3 with fluent speech, no focal motor/sensory deficits  LABORATORY DATA:  I have reviewed the data as listed    Component Value Date/Time   NA 137 02/20/2024 1018   NA 138 03/31/2020 1305   K 4.0 02/20/2024 1018   CL 102 02/20/2024 1018   CO2 24 02/20/2024 1018   GLUCOSE 118 (H) 02/20/2024 1018   BUN 28 (H) 02/20/2024 1018   BUN 19 03/31/2020 1305   CREATININE 0.97 02/20/2024 1018   CREATININE 1.03 04/11/2017 1114   CALCIUM  8.5 (L) 02/20/2024 1018   CALCIUM  12.4 (H) 10/30/2020 1741   PROT 6.4 (L) 02/20/2024 1018   PROT 10.5 (HH) 03/31/2020 1305   PROT 10.4 (HH) 03/31/2020 1305   ALBUMIN 3.9 02/20/2024 1018   ALBUMIN 3.6 (L) 03/31/2020 1305   AST 17 02/20/2024 1018   ALT 16 02/20/2024 1018   ALKPHOS 70 02/20/2024 1018   BILITOT 1.4 (H) 02/20/2024 1018   BILITOT 0.9 03/31/2020 1305   GFRNONAA >60 02/20/2024 1018   GFRAA 72 03/31/2020 1305     Lab Results  Component Value Date   WBC 7.6 02/20/2024   NEUTROABS 4.7 02/20/2024   HGB 14.8 02/20/2024   HCT 45.2 02/20/2024   MCV 82.9 02/20/2024   PLT 203 02/20/2024      Chemistry      Component Value Date/Time   NA 137 02/20/2024 1018   NA 138 03/31/2020 1305   K 4.0 02/20/2024 1018   CL 102 02/20/2024 1018   CO2 24 02/20/2024 1018   BUN 28 (H) 02/20/2024 1018   BUN 19 03/31/2020 1305   CREATININE 0.97 02/20/2024 1018   CREATININE 1.03 04/11/2017 1114      Component Value Date/Time   CALCIUM  8.5 (L) 02/20/2024  1018   CALCIUM  12.4 (H) 10/30/2020 1741   ALKPHOS 70 02/20/2024 1018   AST 17 02/20/2024 1018   ALT 16 02/20/2024 1018   BILITOT 1.4 (H) 02/20/2024 1018   BILITOT 0.9 03/31/2020 1305      Latest Reference Range & Units 02/20/24 10:18  Total Protein ELP 6.0 - 8.5 g/dL 6.0 - 8.5 g/dL 5.8 (L) 5.7 (L)  Albumin ELP 2.9 - 4.4 g/dL 3.5  Globulin, Total 2.2 - 3.9 g/dL 2.2 (C)  A/G Ratio 0.7 - 1.7  1.6 (C)  Alpha-1-Globulin 0.0 - 0.4 g/dL 0.3  Joeyj-7-Honalopw 0.4 - 1.0 g/dL 0.7  Beta Globulin 0.7 - 1.3 g/dL 0.9  Gamma Globulin 0.4 - 1.8 g/dL 0.3 (L)  M-SPIKE, % Not Observed g/dL Not Observed  SPE Interp.  Comment  Comment  Comment  IgG (Immunoglobin  G), Serum 603 - 1,613 mg/dL 647 (L)  IgM (Immunoglobulin M), Srm 20 - 172 mg/dL 10 (L)  IgA 61 - 562 mg/dL 35 (L)  (L): Data is abnormally low (C): Corrected   Latest Reference Range & Units 02/20/24 10:18  Kappa free light chain 3.3 - 19.4 mg/L 10.0  Lambda free light chains 5.7 - 26.3 mg/L 6.4  Kappa, lambda light chain ratio 0.26 - 1.65  1.56  Immunofixation Result, Serum  All immunoglobulins appear decreased. Pattern suggestive of  hypogammaglobulinemia.   !: Data is abnormal (C): Corrected  RADIOGRAPHIC STUDIES: I have personally reviewed the radiological images as listed and agreed with the findings in the report.  None new to review

## 2024-02-27 ENCOUNTER — Encounter: Payer: Self-pay | Admitting: *Deleted

## 2024-02-27 ENCOUNTER — Inpatient Hospital Stay: Admitting: Oncology

## 2024-02-27 ENCOUNTER — Inpatient Hospital Stay

## 2024-02-27 ENCOUNTER — Inpatient Hospital Stay (HOSPITAL_BASED_OUTPATIENT_CLINIC_OR_DEPARTMENT_OTHER): Admitting: Oncology

## 2024-02-27 VITALS — BP 189/95 | HR 90 | Temp 97.4°F | Resp 16 | Wt 189.0 lb

## 2024-02-27 VITALS — BP 153/70 | Resp 18

## 2024-02-27 DIAGNOSIS — Z789 Other specified health status: Secondary | ICD-10-CM | POA: Insufficient documentation

## 2024-02-27 DIAGNOSIS — C9000 Multiple myeloma not having achieved remission: Secondary | ICD-10-CM | POA: Diagnosis not present

## 2024-02-27 DIAGNOSIS — C7951 Secondary malignant neoplasm of bone: Secondary | ICD-10-CM | POA: Insufficient documentation

## 2024-02-27 DIAGNOSIS — C9001 Multiple myeloma in remission: Secondary | ICD-10-CM | POA: Diagnosis not present

## 2024-02-27 MED ORDER — DENOSUMAB 120 MG/1.7ML ~~LOC~~ SOLN
120.0000 mg | Freq: Once | SUBCUTANEOUS | Status: AC
Start: 2024-02-27 — End: 2024-02-27
  Administered 2024-02-27: 120 mg via SUBCUTANEOUS
  Filled 2024-02-27: qty 1.7

## 2024-02-27 NOTE — Assessment & Plan Note (Addendum)
 Patient with IgG kappa multiple myeloma in remission diagnosed in 2022 R-ISS stage III -beta-2 microglobulin: 18.7, albumin: 2.0, LDH: Normal FISH panel positive for 1 p-,-13,14q-,16q-,17p-/-17 and  20q- Cytogenetics-no metaphases available for analysis. 11/21/2020- 03/07/2021: Completed 6 cycles of Dara VRD Patient declined bone marrow transplantation 03/27/2021: On maintenance Revlimid  and monthly daratumumab .  Daratumumab  discontinued 12/05/2023.  Currently on Revlimid  10 mg 3 weeks on/1 week off  - Patient is very compliant with Revlimid .  Expresses no complaints from taking it. - Denies fatigue, diarrhea, constipation, nausea, vomiting, cough, shortness of breath, rash. - Labs reviewed today: CMP: Stable with no hypercalcemia, CBC: No anemia or thrombocytopenia, SPEP: No M spike, decreased IgG, IgM, IgA, normal free light chains with a normal ratio, IFE: Decreased immunoglobulins. - Continue Revlimid  10 mg 3 weeks on/1 week off.  Continue low-dose aspirin .  Return to clinic in 3 months with labs.

## 2024-02-27 NOTE — Progress Notes (Signed)
 Patient presents today for office visit with Dr Davonna and an Xgeva  injection. Calcium  8.5 on 02/20/24. Patient reports taking both his Vitamin D  and Calcium . Denies any scheduled dental work. Patient tolerated injection in left arm well with no complaints voiced.  Site clean and dry with no bruising or swelling noted.  No complaints of pain.  Discharged with vital signs stable and no signs or symptoms of distress noted.

## 2024-02-27 NOTE — Assessment & Plan Note (Addendum)
 Patient had multiple lytic bone lesions at the time of diagnosis of multiple myeloma  - Continue denosumab  every 3 months - Continue calcium  and vitamin D 

## 2024-02-27 NOTE — Progress Notes (Addendum)
 Patient Care Team: Rogers Hai, MD (Inactive) as PCP - General (Hematology)  Clinic Day:  02/27/2024  Referring physician: Rogers Hai, MD   CHIEF COMPLAINT:  CC: IgG kappa Multiple Myeloma in remission  Paul Norton 68 y.o. male was transferred to my care after his prior physician has left.   ASSESSMENT & PLAN:   Assessment & Plan: [name]  is a 68 y.o. male with IgG kappa Multiple Myeloma in remission  Assessment & Plan Multiple myeloma in remission [facility]) Patient with IgG kappa multiple myeloma in remission diagnosed in 2022 R-ISS stage III -beta-2 microglobulin: 18.7, albumin: 2.0, LDH: Normal FISH panel positive for 1 p-,-13,14q-,16q-,17p-/-17 and  20q- Cytogenetics-no metaphases available for analysis. 11/21/2020- 03/07/2021: Completed 6 cycles of Dara VRD Patient declined bone marrow transplantation 03/27/2021: On maintenance Revlimid  and monthly daratumumab .  Daratumumab  discontinued 12/05/2023.  Currently on Revlimid  10 mg 3 weeks on/1 week off  - Patient is very compliant with Revlimid .  Expresses no complaints from taking it. - Denies fatigue, diarrhea, constipation, nausea, vomiting, cough, shortness of breath, rash. - Labs reviewed today: CMP: Stable with no hypercalcemia, CBC: No anemia or thrombocytopenia, SPEP: No M spike, decreased IgG, IgM, IgA, normal free light chains with a normal ratio, IFE: Decreased immunoglobulins. - Continue Revlimid  10 mg 3 weeks on/1 week off.  Continue low-dose aspirin .  Return to clinic in 3 months with labs. Metastasis to bone [facility]) Patient had multiple lytic bone lesions at the time of diagnosis of multiple myeloma  - Continue denosumab  every 3 months - Continue calcium  and vitamin D  Antiviral prophylaxis recommended Patient is on acyclovir  for antiviral prophylaxis while getting daratumumab   - Can discontinue acyclovir  after 3 months after last dose of daratumumab .  That will be next  week.    The patient understands the plans discussed today and is in agreement with them.  He knows to contact our office if he develops concerns prior to his next appointment.  50 minutes of total time was spent for this patient encounter, including preparation, face-to-face counseling with the patient and coordination of care, physical exam, and documentation of the encounter. > 50% of the time was spent on counseling as documented under my assessment and plan.    [name] Teague,acting as a Neurosurgeon for [name], MD.,have documented all relevant documentation on the behalf of [name], MD,as directed by  [name], MD while in the presence of [name], MD.  I, [name] MD, have reviewed the above documentation for accuracy and completeness, and I agree with the above.     [name], MD  [facility] [place] - A DEPT OF [facility] [address] [place] [address] Dept: [phone] Dept Fax: [phone]   Orders Placed This Encounter  Procedures   CBC with Differential/Platelet    Standing Status:   Future    Expected Date:   05/13/2024    Expiration Date:   08/11/2024   Comprehensive metabolic panel with GFR    Standing Status:   Future    Expected Date:   05/13/2024    Expiration Date:   08/11/2024   Kappa/lambda light chains    Standing Status:   Future    Expected Date:   05/13/2024    Expiration Date:   08/11/2024   Multiple Myeloma Panel (SPEP&IFE w/QIG)    Standing Status:   Future    Expected Date:   05/13/2024  Expiration Date:   08/11/2024     ONCOLOGY HISTORY:   I have reviewed his chart and materials related to his cancer extensively and collaborated history with the patient. Summary of oncologic history is as follows:   IgG kappa Multiple Myeloma in remission:  -Admitted to the hospital with hypercalcemia and hemoglobin of 3.9 and was found to have multiple  lytic bone lesions -11/01/2020: Bone marrow, aspirate, clot, core: Hypercellular bone marrow with plasma cell neoplasm.  Extensive infiltration by atypical plasma cells representing 85% of all cells in the aspirate associated with prominent interstitial infiltrates.  The plasma cells display kappa light chain restriction consistent with plasma cell neoplasm.  - Multiple myeloma FISH: 13 q. 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   - Beta-2 microglobulin: 18.7, albumin: 1.8, LDH: Normal - SPEP: M spike: 5.4, IgG: 7394, IgM: 15, IgA: 18, 24 hour total urine protein: 3.6g, free kappa light chain: Very high, free lambda light chain: 11, ratio: 1030.53.  IFE: Immunofixation shows IgG monoclonal protein with kappa light chain specificity and monoclonal free kappa light chains. - CRAB: Hemoglobin: 3.9, calcium : 14.2,Creatinine:4.50 - CT CAP: Extensive lytic foci and bony remodeling throughout the axial and appendicular skeleton of the chest, abdomen and pelvis including the proximal femur and humeri.  Bilateral multiple rib fractures. - R-ISS stage III -11/21/2020-03/07/2021: 6 cycles of Dara VRD -Patient declined bone marrow transplant -01/24/2021-Current: Denosumab  monthly. Changed to every 3 months 02/27/2024 -03/21/2021-12/05/2023: Dara + Revlimid  -12/05/2023: Revlimid  10 mg 3 weeks on/1 week off  Current Treatment:  Revlimid  10mg  3 weeks on/1 week off  INTERVAL HISTORY:   Paul Norton is here today for follow up. His appetite and energy levels are normal.  He is tolerating Revlimid  well and taking it as prescribed. He notes mild tiredness he associates with working outside in the hot weather. He denies any peripheral neuropathy.  He believes he is taking calcium  and vitamin D  supplements as prescribed.  He lives alone. His brother and his wife help him take his  medications.  I have reviewed the past medical history, past surgical history, social history and family history with the patient and they are unchanged from previous note.  ALLERGIES:  has no known allergies.  MEDICATIONS:  Current Outpatient Medications  Medication Sig Dispense Refill   acetaminophen  (TYLENOL ) 325 MG tablet Take 2 tablets (650 mg total) by mouth every 6 (six) hours as needed for mild pain (or Fever >/= 101). 30 tablet 30   acyclovir  (ZOVIRAX ) 400 MG tablet Take 1 tablet (400 mg total) by mouth 2 (two) times daily. 60 tablet 6   amLODipine  (NORVASC ) 10 MG tablet TAKE 1 TABLET BY MOUTH DAILY 90 tablet 3   aspirin  81 MG chewable tablet Chew by mouth daily.     feeding supplement (ENSURE ENLIVE / ENSURE PLUS) LIQD Take 237 mLs by mouth 3 (three) times daily between meals. 237 mL 12   OYSCO 500 + D 500-5 MG-MCG TABS TAKE ONE TABLET BY MOUTH DAILY 30 tablet 3   REVLIMID  10 MG capsule Take 1 capsule (10 mg total) by mouth daily. 21 days on, 7 days off 21 capsule 0   No current facility-administered medications for this visit.    REVIEW OF SYSTEMS:   Constitutional: Denies fevers, chills or abnormal weight loss Eyes: Denies blurriness of vision Ears, nose, mouth, throat, and face: Denies mucositis or sore throat Respiratory: Denies  cough, dyspnea or wheezes Cardiovascular: Denies palpitation, chest discomfort or lower extremity swelling Gastrointestinal:  Denies nausea, heartburn or change in bowel habits Skin: Denies abnormal skin rashes Lymphatics: Denies new lymphadenopathy or easy bruising Neurological:Denies numbness, tingling or new weaknesses Behavioral/Psych: Mood is stable, no new changes  All other systems were reviewed with the patient and are negative.   VITALS:  Blood pressure (!) 189/95, pulse 90, temperature (!) 97.4 F (36.3 C), temperature source Oral, resp. rate 16, weight 189 lb (85.7 kg), SpO2 96%.  Wt Readings from Last 3 Encounters:  02/27/24  189 lb (85.7 kg)  01/30/24 185 lb 13.6 oz (84.3 kg)  01/02/24 188 lb 4.4 oz (85.4 kg)    Body mass index is 26.74 kg/m.  Performance status (ECOG): 1 - Symptomatic but completely ambulatory  PHYSICAL EXAM:   GENERAL:alert, no distress and comfortable SKIN: skin color, texture, turgor are normal, no rashes or significant lesions LYMPH:  no palpable lymphadenopathy in the cervical, axillary or inguinal LUNGS: clear to auscultation and percussion with normal breathing effort HEART: regular rate & rhythm and no murmurs and no lower extremity edema ABDOMEN:abdomen soft, non-tender and normal bowel sounds Musculoskeletal:no cyanosis of digits and no clubbing  NEURO: alert & oriented x 3 with fluent speech, no focal motor/sensory deficits  LABORATORY DATA:  I have reviewed the data as listed    Component Value Date/Time   NA 137 02/20/2024 1018   NA 138 03/31/2020 1305   K 4.0 02/20/2024 1018   CL 102 02/20/2024 1018   CO2 24 02/20/2024 1018   GLUCOSE 118 (H) 02/20/2024 1018   BUN 28 (H) 02/20/2024 1018   BUN 19 03/31/2020 1305   CREATININE 0.97 02/20/2024 1018   CREATININE 1.03 04/11/2017 1114   CALCIUM  8.5 (L) 02/20/2024 1018   CALCIUM  12.4 (H) 10/30/2020 1741   PROT 6.4 (L) 02/20/2024 1018   PROT 10.5 (HH) 03/31/2020 1305   PROT 10.4 (HH) 03/31/2020 1305   ALBUMIN 3.9 02/20/2024 1018   ALBUMIN 3.6 (L) 03/31/2020 1305   AST 17 02/20/2024 1018   ALT 16 02/20/2024 1018   ALKPHOS 70 02/20/2024 1018   BILITOT 1.4 (H) 02/20/2024 1018   BILITOT 0.9 03/31/2020 1305   GFRNONAA >60 02/20/2024 1018   GFRAA 72 03/31/2020 1305     Lab Results  Component Value Date   WBC 7.6 02/20/2024   NEUTROABS 4.7 02/20/2024   HGB 14.8 02/20/2024   HCT 45.2 02/20/2024   MCV 82.9 02/20/2024   PLT 203 02/20/2024      Chemistry      Component Value Date/Time   NA 137 02/20/2024 1018   NA 138 03/31/2020 1305   K 4.0 02/20/2024 1018   CL 102 02/20/2024 1018   CO2 24 02/20/2024 1018    BUN 28 (H) 02/20/2024 1018   BUN 19 03/31/2020 1305   CREATININE 0.97 02/20/2024 1018   CREATININE 1.03 04/11/2017 1114      Component Value Date/Time   CALCIUM  8.5 (L) 02/20/2024 1018   CALCIUM  12.4 (H) 10/30/2020 1741   ALKPHOS 70 02/20/2024 1018   AST 17 02/20/2024 1018   ALT 16 02/20/2024 1018   BILITOT 1.4 (H) 02/20/2024 1018   BILITOT 0.9 03/31/2020 1305      Latest Reference Range & Units 02/20/24 10:18  Total Protein ELP 6.0 - 8.5 g/dL 6.0 - 8.5 g/dL 5.8 (L) 5.7 (L)  Albumin ELP 2.9 - 4.4 g/dL 3.5  Globulin, Total 2.2 - 3.9 g/dL 2.2 (C)  A/G Ratio 0.7 - 1.7  1.6 (C)  Alpha-1-Globulin 0.0 - 0.4 g/dL 0.3  Joeyj-7-Honalopw 0.4 - 1.0 g/dL 0.7  Beta Globulin 0.7 - 1.3 g/dL 0.9  Gamma Globulin 0.4 - 1.8 g/dL 0.3 (L)  M-SPIKE, % Not Observed g/dL Not Observed  SPE Interp.  Comment  Comment  Comment  IgG (Immunoglobin G), Serum 603 - 1,613 mg/dL 647 (L)  IgM (Immunoglobulin M), Srm 20 - 172 mg/dL 10 (L)  IgA 61 - 562 mg/dL 35 (L)  (L): Data is abnormally low (C): Corrected   Latest Reference Range & Units 02/20/24 10:18  Kappa free light chain 3.3 - 19.4 mg/L 10.0  Lambda free light chains 5.7 - 26.3 mg/L 6.4  Kappa, lambda light chain ratio 0.26 - 1.65  1.56  Immunofixation Result, Serum  All immunoglobulins appear decreased. Pattern suggestive of  hypogammaglobulinemia.   !: Data is abnormal (C): Corrected  RADIOGRAPHIC STUDIES: I have personally reviewed the radiological images as listed and agreed with the findings in the report.  None new to review

## 2024-02-27 NOTE — Progress Notes (Signed)
Patient is taking Revlimid as prescribed.  He has not missed any doses and reports no side effects at this time.   

## 2024-02-27 NOTE — Patient Instructions (Signed)
 [facility] at [facility] Discharge Instructions   You were seen and examined today by Dr. Davonna.  She reviewed the results of your lab work which are normal/stable.   We will proceed with your injection today.   Continue Revlimid  as prescribed.   We will see you back in 3 months. We will repeat lab work prior to this visit.   Return as scheduled.    Thank you for choosing [facility] at [facility] to provide your oncology and hematology care.  To afford each patient quality time with our provider, please arrive at least 15 minutes before your scheduled appointment time.   If you have a lab appointment with the Cancer Center please come in thru the Main Entrance and check in at the main information desk.  You need to re-schedule your appointment should you arrive 10 or more minutes late.  We strive to give you quality time with our providers, and arriving late affects you and other patients whose appointments are after yours.  Also, if you no show three or more times for appointments you may be dismissed from the clinic at the providers discretion.     Again, thank you for choosing [facility].  Our hope is that these requests will decrease the amount of time that you wait before being seen by our physicians.       _____________________________________________________________  Should you have questions after your visit to [facility], please contact our office at [phone] and follow the prompts.  Our office hours are 8:00 a.m. and 4:30 p.m. Monday - Friday.  Please note that voicemails left after 4:00 p.m. may not be returned until the following business day.  We are closed weekends and major holidays.  You do have access to a nurse 24-7, just call the main number to the clinic [phone] and do not press any options, hold on the line and a nurse will answer the phone.    For prescription refill requests,  have your pharmacy contact our office and allow 72 hours.    Due to Covid, you will need to wear a mask upon entering the hospital. If you do not have a mask, a mask will be given to you at the Main Entrance upon arrival. For doctor visits, patients may have 1 support person age 58 or older with them. For treatment visits, patients can not have anyone with them due to social distancing guidelines and our immunocompromised population.

## 2024-02-27 NOTE — Assessment & Plan Note (Addendum)
 Patient is on acyclovir  for antiviral prophylaxis while getting daratumumab   - Can discontinue acyclovir  after 3 months after last dose of daratumumab .  That will be next week.

## 2024-02-27 NOTE — Patient Instructions (Signed)
 [facility] [place] - A DEPT OF [facility]  Discharge Instructions: Thank you for choosing [facility] to provide your oncology and hematology care.  If you have a lab appointment with the Cancer Center - please note that after April 8th, 2024, all labs will be drawn in the cancer center.  You do not have to check in or register with the main entrance as you have in the past but will complete your check-in in the cancer center.  Wear comfortable clothing and clothing appropriate for easy access to any Portacath or PICC line.   We strive to give you quality time with your provider. You may need to reschedule your appointment if you arrive late (15 or more minutes).  Arriving late affects you and other patients whose appointments are after yours.  Also, if you miss three or more appointments without notifying the office, you may be dismissed from the clinic at the provider's discretion.      For prescription refill requests, have your pharmacy contact our office and allow 72 hours for refills to be completed.    Today you received the following Xgeva  injection.  Denosumab  Injection (Oncology) What is this medication? DENOSUMAB  (den oh SUE mab) prevents weakened bones caused by cancer. It may also be used to treat noncancerous bone tumors that cannot be removed by surgery. It can also be used to treat high calcium  levels in the blood caused by cancer. It works by blocking a protein that causes bones to break down quickly. This slows down the release of calcium  from bones, which lowers calcium  levels in your blood. It also makes your bones stronger and less likely to break (fracture). This medicine may be used for other purposes; ask your health care provider or pharmacist if you have questions. COMMON BRAND NAME(S): XGEVA  What should I tell my care team before I take this medication? They need to know if you have any of these conditions: Dental disease Having  surgery or tooth extraction Infection Kidney disease Low levels of calcium  or vitamin D in the blood Malnutrition On hemodialysis Skin conditions or sensitivity Thyroid  or parathyroid disease An unusual reaction to denosumab , other medications, foods, dyes, or preservatives Pregnant or trying to get pregnant Breast-feeding How should I use this medication? This medication is for injection under the skin. It is given by your care team in a hospital or clinic setting. A special MedGuide will be given to you before each treatment. Be sure to read this information carefully each time. Talk to your care team about the use of this medication in children. While it may be prescribed for children as young as 13 years for selected conditions, precautions do apply. Overdosage: If you think you have taken too much of this medicine contact a poison control center or emergency room at once. NOTE: This medicine is only for you. Do not share this medicine with others. What if I miss a dose? Keep appointments for follow-up doses. It is important not to miss your dose. Call your care team if you are unable to keep an appointment. What may interact with this medication? Do not take this medication with any of the following: Other medications containing denosumab  This medication may also interact with the following: Medications that lower your chance of fighting infection Steroid medications, such as prednisone  or cortisone This list may not describe all possible interactions. Give your health care provider a list of all the medicines, herbs, non-prescription  drugs, or dietary supplements you use. Also tell them if you smoke, drink alcohol, or use illegal drugs. Some items may interact with your medicine. What should I watch for while using this medication? Your condition will be monitored carefully while you are receiving this medication. You may need blood work while taking this medication. This medication  may increase your risk of getting an infection. Call your care team for advice if you get a fever, chills, sore throat, or other symptoms of a cold or flu. Do not treat yourself. Try to avoid being around people who are sick. You should make sure you get enough calcium  and vitamin D while you are taking this medication, unless your care team tells you not to. Discuss the foods you eat and the vitamins you take with your care team. Some people who take this medication have severe bone, joint, or muscle pain. This medication may also increase your risk for jaw problems or a broken thigh bone. Tell your care team right away if you have severe pain in your jaw, bones, joints, or muscles. Tell your care team if you have any pain that does not go away or that gets worse. Talk to your care team if you may be pregnant. Serious birth defects can occur if you take this medication during pregnancy and for 5 months after the last dose. You will need a negative pregnancy test before starting this medication. Contraception is recommended while taking this medication and for 5 months after the last dose. Your care team can help you find the option that works for you. What side effects may I notice from receiving this medication? Side effects that you should report to your care team as soon as possible: Allergic reactions--skin rash, itching, hives, swelling of the face, lips, tongue, or throat Bone, joint, or muscle pain Low calcium  level--muscle pain or cramps, confusion, tingling, or numbness in the hands or feet Osteonecrosis of the jaw--pain, swelling, or redness in the mouth, numbness of the jaw, poor healing after dental work, unusual discharge from the mouth, visible bones in the mouth Side effects that usually do not require medical attention (report to your care team if they continue or are bothersome): Cough Diarrhea Fatigue Headache Nausea This list may not describe all possible side effects. Call your  doctor for medical advice about side effects. You may report side effects to FDA at 1-800-FDA-1088. Where should I keep my medication? This medication is given in a hospital or clinic. It will not be stored at home. NOTE: This sheet is a summary. It may not cover all possible information. If you have questions about this medicine, talk to your doctor, pharmacist, or health care provider.  2024 Elsevier/Gold Standard (2021-11-22 00:00:00)   To help prevent nausea and vomiting after your treatment, we encourage you to take your nausea medication as directed.  BELOW ARE SYMPTOMS THAT SHOULD BE REPORTED IMMEDIATELY: *FEVER GREATER THAN 100.4 F (38 C) OR HIGHER *CHILLS OR SWEATING *NAUSEA AND VOMITING THAT IS NOT CONTROLLED WITH YOUR NAUSEA MEDICATION *UNUSUAL SHORTNESS OF BREATH *UNUSUAL BRUISING OR BLEEDING *URINARY PROBLEMS (pain or burning when urinating, or frequent urination) *BOWEL PROBLEMS (unusual diarrhea, constipation, pain near the anus) TENDERNESS IN MOUTH AND THROAT WITH OR WITHOUT PRESENCE OF ULCERS (sore throat, sores in mouth, or a toothache) UNUSUAL RASH, SWELLING OR PAIN  UNUSUAL VAGINAL DISCHARGE OR ITCHING   Items with * indicate a potential emergency and should be followed up as soon as possible or go to  the Emergency Department if any problems should occur.  Please show the CHEMOTHERAPY ALERT CARD or IMMUNOTHERAPY ALERT CARD at check-in to the Emergency Department and triage nurse.  Should you have questions after your visit or need to cancel or reschedule your appointment, please contact [facility] [place] - A DEPT OF [facility] [phone]  and follow the prompts.  Office hours are 8:00 a.m. to 4:30 p.m. Monday - Friday. Please note that voicemails left after 4:00 p.m. may not be returned until the following business day.  We are closed weekends and major holidays. You have access to a nurse at all times for urgent questions. Please call the  main number to the clinic [phone] and follow the prompts.  For any non-urgent questions, you may also contact your provider using MyChart. We now offer e-Visits for anyone 64 and older to request care online for non-urgent symptoms. For details visit [email or web address].   Also download the MyChart app! Go to the app store, search MyChart, open the app, select [facility], and log in with your MyChart username and password.

## 2024-03-04 ENCOUNTER — Other Ambulatory Visit: Payer: Self-pay

## 2024-03-04 MED ORDER — REVLIMID 10 MG PO CAPS: 10.0000 mg | ORAL_CAPSULE | Freq: Every day | ORAL | 0 refills | Status: DC

## 2024-03-04 NOTE — Telephone Encounter (Signed)
 Chart reviewed. Revlimid  refilled per last office note with Dr. Rogers and verbal order from Dr. Davonna.

## 2024-04-01 ENCOUNTER — Other Ambulatory Visit: Payer: Self-pay

## 2024-04-01 MED ORDER — REVLIMID 10 MG PO CAPS: 10.0000 mg | ORAL_CAPSULE | Freq: Every day | ORAL | 0 refills | Status: DC

## 2024-04-01 NOTE — Telephone Encounter (Signed)
 Chart reviewed. Revlimid  refilled per last office note with Dr. Davonna.

## 2024-04-28 ENCOUNTER — Encounter (HOSPITAL_COMMUNITY): Payer: Self-pay | Admitting: Oncology

## 2024-04-29 ENCOUNTER — Other Ambulatory Visit: Payer: Self-pay

## 2024-04-29 MED ORDER — REVLIMID 10 MG PO CAPS: 10.0000 mg | ORAL_CAPSULE | Freq: Every day | ORAL | 0 refills | Status: DC

## 2024-04-29 NOTE — Progress Notes (Signed)
 Chart reviewed. Revlimid  refilled per last office note with Dr. Davonna.

## 2024-05-13 ENCOUNTER — Inpatient Hospital Stay: Attending: Hematology

## 2024-05-13 DIAGNOSIS — C9001 Multiple myeloma in remission: Secondary | ICD-10-CM | POA: Diagnosis present

## 2024-05-13 LAB — COMPREHENSIVE METABOLIC PANEL WITH GFR
ALT: 17 U/L (ref 0–44)
AST: 19 U/L (ref 15–41)
Albumin: 4.6 g/dL (ref 3.5–5.0)
Alkaline Phosphatase: 86 U/L (ref 38–126)
Anion gap: 11 (ref 5–15)
BUN: 27 mg/dL — ABNORMAL HIGH (ref 8–23)
CO2: 30 mmol/L (ref 22–32)
Calcium: 9.3 mg/dL (ref 8.9–10.3)
Chloride: 99 mmol/L (ref 98–111)
Creatinine, Ser: 1.14 mg/dL (ref 0.61–1.24)
GFR, Estimated: >60 mL/min (ref >60)
Glucose, Bld: 109 mg/dL — ABNORMAL HIGH (ref 70–99)
Potassium: 3.9 mmol/L (ref 3.5–5.1)
Sodium: 140 mmol/L (ref 135–145)
Total Bilirubin: 1.2 mg/dL (ref 0.0–1.2)
Total Protein: 6.7 g/dL (ref 6.5–8.1)

## 2024-05-13 LAB — CBC WITH DIFFERENTIAL/PLATELET
Abs Immature Granulocytes: 0.03 K/uL (ref 0.00–0.07)
Basophils Absolute: 0.1 K/uL (ref 0.0–0.1)
Basophils Relative: 1 %
Eosinophils Absolute: 0.1 K/uL (ref 0.0–0.5)
Eosinophils Relative: 2 %
HCT: 47.3 % (ref 39.0–52.0)
Hemoglobin: 15.6 g/dL (ref 13.0–17.0)
Immature Granulocytes: 0 %
Lymphocytes Relative: 14 %
Lymphs Abs: 1.3 K/uL (ref 0.7–4.0)
MCH: 27.5 pg (ref 26.0–34.0)
MCHC: 33 g/dL (ref 30.0–36.0)
MCV: 83.3 fL (ref 80.0–100.0)
Monocytes Absolute: 1.5 K/uL — ABNORMAL HIGH (ref 0.1–1.0)
Monocytes Relative: 16 %
Neutro Abs: 6.3 K/uL (ref 1.7–7.7)
Neutrophils Relative %: 67 %
Platelets: 213 K/uL (ref 150–400)
RBC: 5.68 MIL/uL (ref 4.22–5.81)
RDW: 15.1 % (ref 11.5–15.5)
WBC: 9.5 K/uL (ref 4.0–10.5)
nRBC: 0 % (ref 0.0–0.2)

## 2024-05-14 LAB — KAPPA/LAMBDA LIGHT CHAINS
Kappa free light chain: 9.7 mg/L (ref 3.3–19.4)
Kappa, lambda light chain ratio: 1.49 (ref 0.26–1.65)
Lambda free light chains: 6.5 mg/L (ref 5.7–26.3)

## 2024-05-18 ENCOUNTER — Other Ambulatory Visit: Payer: Self-pay | Admitting: *Deleted

## 2024-05-18 LAB — MULTIPLE MYELOMA PANEL, SERUM
Albumin SerPl Elph-Mcnc: 4 g/dL (ref 2.9–4.4)
Albumin/Glob SerPl: 1.9 — ABNORMAL HIGH (ref 0.7–1.7)
Alpha 1: 0.3 g/dL (ref 0.0–0.4)
Alpha2 Glob SerPl Elph-Mcnc: 0.7 g/dL (ref 0.4–1.0)
B-Globulin SerPl Elph-Mcnc: 0.9 g/dL (ref 0.7–1.3)
Gamma Glob SerPl Elph-Mcnc: 0.3 g/dL — ABNORMAL LOW (ref 0.4–1.8)
Globulin, Total: 2.2 g/dL (ref 2.2–3.9)
IgA: 33 mg/dL — ABNORMAL LOW (ref 61–437)
IgG (Immunoglobin G), Serum: 395 mg/dL — ABNORMAL LOW (ref 603–1613)
IgM (Immunoglobulin M), Srm: 8 mg/dL — ABNORMAL LOW (ref 20–172)
Total Protein ELP: 6.2 g/dL (ref 6.0–8.5)

## 2024-05-18 MED ORDER — AMLODIPINE BESYLATE 10 MG PO TABS: 10.0000 mg | ORAL_TABLET | Freq: Every day | ORAL | 3 refills | Status: AC

## 2024-05-20 ENCOUNTER — Inpatient Hospital Stay: Attending: Hematology | Admitting: Oncology

## 2024-05-20 ENCOUNTER — Other Ambulatory Visit

## 2024-05-20 ENCOUNTER — Inpatient Hospital Stay

## 2024-05-20 VITALS — BP 184/84 | HR 68 | Temp 97.7°F | Resp 18 | Wt 187.0 lb

## 2024-05-20 DIAGNOSIS — C9001 Multiple myeloma in remission: Secondary | ICD-10-CM | POA: Diagnosis present

## 2024-05-20 DIAGNOSIS — C7951 Secondary malignant neoplasm of bone: Secondary | ICD-10-CM | POA: Diagnosis not present

## 2024-05-20 DIAGNOSIS — C9000 Multiple myeloma not having achieved remission: Secondary | ICD-10-CM

## 2024-05-20 MED ORDER — DENOSUMAB 120 MG/1.7ML ~~LOC~~ SOLN
120.0000 mg | Freq: Once | SUBCUTANEOUS | Status: AC
  Administered 2024-05-20: 120 mg via SUBCUTANEOUS

## 2024-05-20 NOTE — Progress Notes (Signed)
 Patient Care Team: Rogers Hai, MD (Inactive) as PCP - General (Hematology)  Clinic Day:  02/27/2024    CHIEF COMPLAINT:  CC: IgG kappa Multiple Myeloma in remission   ASSESSMENT & PLAN:   Assessment & Plan: Paul Norton  is a 68 y.o. male with IgG kappa Multiple Myeloma in remission   Multiple myeloma in remission IgG kappa multiple myeloma in remission diagnosed in 2022 R-ISS stage III -beta-2 microglobulin: 18.7, albumin: 2.0, LDH: Normal FISH panel positive for 1 p-,-13,14q-,16q-,17p-/-17 and  20q- Cytogenetics-no metaphases available for analysis. 11/21/2020- 03/07/2021: Completed 6 cycles of Dara VRD Patient declined bone marrow transplantation 03/27/2021: On maintenance Revlimid  and monthly daratumumab .  Daratumumab  discontinued 12/05/2023.  Currently on Revlimid  10 mg 3 weeks on/1 week off   - Patient is very compliant with Revlimid .  Expresses no complaints from taking it. - Denies fatigue, diarrhea, constipation, nausea, vomiting, cough, shortness of breath, rash. - Labs reviewed today: CMP: Stable with no hypercalcemia, CBC: No anemia or thrombocytopenia, SPEP: No M spike, decreased IgG, IgM, IgA, normal free light chains with a normal ratio, IFE: Hypogammaglobulinemia. - Continue Revlimid  10 mg 3 weeks on/1 week off.  Continue low-dose aspirin .   Return to clinic in 3 months with labs.  Metastasis to bone Patient had multiple lytic bone lesions at the time of diagnosis of multiple myeloma   - Continue denosumab  every 3 months. Due today - Continue calcium  and vitamin D   Antiviral prophylaxis Discontinued acyclovir  at last visit after 3 months of completion of daratumumab    The patient understands the plans discussed today and is in agreement with them.  He knows to contact our office if he develops concerns prior to his next appointment.  The total time spent in the appointment was 20 minutes for the encounter  with patient, including review  of chart and various tests results, discussions about plan of care and coordination of care plan    [name], MD  [facility] [place] - A DEPT OF JOLYNN HUNT [facility] [place] [address] [place] [address] Dept: [phone] Dept Fax: [phone]   Orders Placed This Encounter  Procedures   CBC with Differential    Standing Status:   Future    Expected Date:   08/13/2024    Expiration Date:   11/11/2024   Comprehensive metabolic panel    Standing Status:   Future    Expected Date:   08/13/2024    Expiration Date:   11/11/2024   Magnesium     Standing Status:   Future    Expected Date:   08/13/2024    Expiration Date:   11/11/2024   Kappa/lambda light chains    Standing Status:   Future    Expected Date:   08/13/2024    Expiration Date:   11/11/2024   Immunofixation electrophoresis    Standing Status:   Future    Expected Date:   08/13/2024    Expiration Date:   11/11/2024   Protein electrophoresis, serum    Standing Status:   Future    Expected Date:   08/13/2024    Expiration Date:   11/11/2024     ONCOLOGY HISTORY:   I have reviewed his chart and materials related to his cancer extensively and collaborated history with the patient. Summary of oncologic history is as follows:   IgG kappa Multiple Myeloma in remission:  -Admitted to the hospital with hypercalcemia and hemoglobin of 3.9 and was  found to have multiple lytic bone lesions -11/01/2020: Bone marrow, aspirate, clot, core: Hypercellular bone marrow with plasma cell neoplasm.  Extensive infiltration by atypical plasma cells representing 85% of all cells in the aspirate associated with prominent interstitial infiltrates.  The plasma cells display kappa light chain restriction consistent with plasma cell neoplasm.  - Multiple myeloma FISH: 13 q. deletion, 1p32/1q21, p53 (17p-/-17), 20q-, 14 q-, 16q-: Detected  - Hypodiploidy karyotype - t(11;14), t(4;14), u(85,83),  t(14;20): Loss of IgH present.   - Beta-2 microglobulin: 18.7, albumin: 1.8, LDH: Normal - SPEP: M spike: 5.4, IgG: 7394, IgM: 15, IgA: 18, 24 hour total urine protein: 3.6g, free kappa light chain: Very high, free lambda light chain: 11, ratio: 1030.53.  IFE: Immunofixation shows IgG monoclonal protein with kappa light chain specificity and monoclonal free kappa light chains. - CRAB: Hemoglobin: 3.9, calcium : 14.2,Creatinine:4.50 - CT CAP: Extensive lytic foci and bony remodeling throughout the axial and appendicular skeleton of the chest, abdomen and pelvis including the proximal femur and humeri.  Bilateral multiple rib fractures. - R-ISS stage III -11/21/2020-03/07/2021: 6 cycles of Dara VRD -Patient declined bone marrow transplant -01/24/2021-Current: Denosumab  monthly. Changed to every 3 months 02/27/2024 -03/21/2021-12/05/2023: Daratumumab  + Revlimid  -12/05/2023- Current: Revlimid  10 mg 3 weeks on/1 week off  Current Treatment:  Revlimid  10mg  3 weeks on/1 week off  INTERVAL HISTORY:   Discussed the use of AI scribe software for clinical note transcription with the patient, who gave verbal consent to proceed.  History of Present Illness Paul Norton is a 68 year old male with multiple myeloma who presents for routine follow-up and treatment.  He is currently on Revlimid  for multiple myeloma, taking it three weeks on and one week off. He confirms adherence to this regimen using a pill box and recalls receiving the medication in May, continuing to manage it as prescribed.  He was previously on Aciclovir to prevent viral infections during infusions but has since stopped taking it. He is unsure if he is still taking it but believes he is not.  He is scheduled to receive a denosumab  shot today, which he receives for bone health. He is not fully aware of the details but acknowledges receiving shots occasionally.  No fevers, chills, night sweats, abdominal pain, or  tiredness.    I have reviewed the past medical history, past surgical history, social history and family history with the patient and they are unchanged from previous note.  ALLERGIES:  has no known allergies.  MEDICATIONS:  Current Outpatient Medications  Medication Sig Dispense Refill   acetaminophen  (TYLENOL ) 325 MG tablet Take 2 tablets (650 mg total) by mouth every 6 (six) hours as needed for mild pain (or Fever >/= 101). 30 tablet 30   acyclovir  (ZOVIRAX ) 400 MG tablet Take 1 tablet (400 mg total) by mouth 2 (two) times daily. 60 tablet 6   amLODipine  (NORVASC ) 10 MG tablet Take 1 tablet (10 mg total) by mouth daily. 90 tablet 3   aspirin  81 MG chewable tablet Chew by mouth daily.     feeding supplement (ENSURE ENLIVE / ENSURE PLUS) LIQD Take 237 mLs by mouth 3 (three) times daily between meals. 237 mL 12   OYSCO 500 + D 500-5 MG-MCG TABS TAKE ONE TABLET BY MOUTH DAILY 30 tablet 3   REVLIMID  10 MG capsule Take 1 capsule (10 mg total) by mouth daily. 21 days on, 7 days off 21 capsule 0   No current facility-administered medications for this visit.  REVIEW OF SYSTEMS:   Constitutional: Denies fevers, chills or abnormal weight loss Eyes: Denies blurriness of vision Ears, nose, mouth, throat, and face: Denies mucositis or sore throat Respiratory: Denies cough, dyspnea or wheezes Cardiovascular: Denies palpitation, chest discomfort or lower extremity swelling Gastrointestinal:  Denies nausea, heartburn or change in bowel habits Skin: Denies abnormal skin rashes Lymphatics: Denies new lymphadenopathy or easy bruising Neurological:Denies numbness, tingling or new weaknesses Behavioral/Psych: Mood is stable, no new changes  All other systems were reviewed with the patient and are negative.   VITALS:  Blood pressure (!) 184/84, pulse 68, temperature 97.7 F (36.5 C), temperature source Tympanic, resp. rate 18, weight 187 lb (84.8 kg), SpO2 100%.  Wt Readings from Last 3  Encounters:  05/20/24 187 lb (84.8 kg)  02/27/24 189 lb (85.7 kg)  01/30/24 185 lb 13.6 oz (84.3 kg)    Body mass index is 26.45 kg/m.  Performance status (ECOG): 1 - Symptomatic but completely ambulatory  PHYSICAL EXAM:   GENERAL:alert, no distress and comfortable SKIN: skin color, texture, turgor are normal, no rashes or significant lesions LYMPH:  no palpable lymphadenopathy in the cervical, axillary or inguinal LUNGS: clear to auscultation and percussion with normal breathing effort HEART: regular rate & rhythm and no murmurs and no lower extremity edema ABDOMEN:abdomen soft, non-tender and normal bowel sounds Musculoskeletal:no cyanosis of digits and no clubbing  NEURO: alert & oriented x 3 with fluent speech   LABORATORY DATA:  I have reviewed the data as listed    Component Value Date/Time   NA 140 05/13/2024 1424   NA 138 03/31/2020 1305   K 3.9 05/13/2024 1424   CL 99 05/13/2024 1424   CO2 30 05/13/2024 1424   GLUCOSE 109 (H) 05/13/2024 1424   BUN 27 (H) 05/13/2024 1424   BUN 19 03/31/2020 1305   CREATININE 1.14 05/13/2024 1424   CREATININE 1.03 04/11/2017 1114   CALCIUM  9.3 05/13/2024 1424   CALCIUM  12.4 (H) 10/30/2020 1741   PROT 6.7 05/13/2024 1424   PROT 10.5 (HH) 03/31/2020 1305   PROT 10.4 (HH) 03/31/2020 1305   ALBUMIN 4.6 05/13/2024 1424   ALBUMIN 3.6 (L) 03/31/2020 1305   AST 19 05/13/2024 1424   ALT 17 05/13/2024 1424   ALKPHOS 86 05/13/2024 1424   BILITOT 1.2 05/13/2024 1424   BILITOT 0.9 03/31/2020 1305   GFRNONAA >60 05/13/2024 1424   GFRAA 72 03/31/2020 1305     Lab Results  Component Value Date   WBC 9.5 05/13/2024   NEUTROABS 6.3 05/13/2024   HGB 15.6 05/13/2024   HCT 47.3 05/13/2024   MCV 83.3 05/13/2024   PLT 213 05/13/2024      Chemistry      Component Value Date/Time   NA 140 05/13/2024 1424   NA 138 03/31/2020 1305   K 3.9 05/13/2024 1424   CL 99 05/13/2024 1424   CO2 30 05/13/2024 1424   BUN 27 (H) 05/13/2024 1424    BUN 19 03/31/2020 1305   CREATININE 1.14 05/13/2024 1424   CREATININE 1.03 04/11/2017 1114      Component Value Date/Time   CALCIUM  9.3 05/13/2024 1424   CALCIUM  12.4 (H) 10/30/2020 1741   ALKPHOS 86 05/13/2024 1424   AST 19 05/13/2024 1424   ALT 17 05/13/2024 1424   BILITOT 1.2 05/13/2024 1424   BILITOT 0.9 03/31/2020 1305      Latest Reference Range & Units 05/13/24 14:24  Total Protein ELP 6.0 - 8.5 g/dL 6.2 (C)  Albumin SerPl  Elph-Mcnc 2.9 - 4.4 g/dL 4.0 (C)  Albumin/Glob SerPl 0.7 - 1.7  1.9 (H) (C)  Alpha2 Glob SerPl Elph-Mcnc 0.4 - 1.0 g/dL 0.7 (C)  Alpha 1 0.0 - 0.4 g/dL 0.3 (C)  Gamma Glob SerPl Elph-Mcnc 0.4 - 1.8 g/dL 0.3 (L) (C)  M Protein SerPl Elph-Mcnc Not Observed g/dL Not Observed (C)  IFE 1  Comment ! (C)  Globulin, Total 2.2 - 3.9 g/dL 2.2 (C)  B-Globulin SerPl Elph-Mcnc 0.7 - 1.3 g/dL 0.9 (C)  IgG (Immunoglobin G), Serum 603 - 1,613 mg/dL 604 (L)  IgM (Immunoglobulin M), Srm 20 - 172 mg/dL 8 (L)  IgA 61 - 562 mg/dL 33 (L)  (H): Data is abnormally high (L): Data is abnormally low !: Data is abnormal (C): Corrected   Latest Reference Range & Units 05/13/24 14:24  Kappa free light chain 3.3 - 19.4 mg/L 9.7  Lambda free light chains 5.7 - 26.3 mg/L 6.5  Kappa, lambda light chain ratio 0.26 - 1.65  1.49    RADIOGRAPHIC STUDIES: I have personally reviewed the radiological images as listed and agreed with the findings in the report.  None new to review

## 2024-05-20 NOTE — Patient Instructions (Signed)
 [facility] at [facility] Discharge Instructions   You were seen and examined today by Dr. Davonna.  She reviewed the results of your lab work which are normal/stable.   We will proceed with your injection today.   Return as scheduled.    Thank you for choosing [facility] to provide your oncology and hematology care.  To afford each patient quality time with our provider, please arrive at least 15 minutes before your scheduled appointment time.   If you have a lab appointment with the Cancer Center please come in thru the Main Entrance and check in at the main information desk.  You need to re-schedule your appointment should you arrive 10 or more minutes late.  We strive to give you quality time with our providers, and arriving late affects you and other patients whose appointments are after yours.  Also, if you no show three or more times for appointments you may be dismissed from the clinic at the providers discretion.     Again, thank you for choosing [facility].  Our hope is that these requests will decrease the amount of time that you wait before being seen by our physicians.       _____________________________________________________________  Should you have questions after your visit to [facility], please contact our office at [phone] and follow the prompts.  Our office hours are 8:00 a.m. and 4:30 p.m. Monday - Friday.  Please note that voicemails left after 4:00 p.m. may not be returned until the following business day.  We are closed weekends and major holidays.  You do have access to a nurse 24-7, just call the main number to the clinic [phone] and do not press any options, hold on the line and a nurse will answer the phone.    For prescription refill requests, have your pharmacy contact our office and allow 72 hours.    Due to Covid, you will need to wear a mask upon entering the  hospital. If you do not have a mask, a mask will be given to you at the Main Entrance upon arrival. For doctor visits, patients may have 1 support person age 33 or older with them. For treatment visits, patients can not have anyone with them due to social distancing guidelines and our immunocompromised population.

## 2024-05-20 NOTE — Progress Notes (Signed)
Patient is taking Revlimid as prescribed.  He has not missed any doses and reports no side effects at this time.   

## 2024-05-20 NOTE — Progress Notes (Signed)
 Patient presents today for Xgeva  injection per providers order.  Vital signs and labs reviewed by MD.  Message received from Isaiah Piety RN/Dr.Kandala patient okay for injection.   Patient has been taking Calcium  and vitamin D , has had no jaw pain and no prior or upcoming dental work.  Stable during administration without incident; injection site WNL; see MAR for injection details.  Patient tolerated procedure well and without incident.  No questions or complaints noted at this time.

## 2024-05-20 NOTE — Patient Instructions (Signed)
 [facility] [place] - A DEPT OF [facility]  Discharge Instructions: Thank you for choosing [facility] to provide your oncology and hematology care.  If you have a lab appointment with the Cancer Center - please note that after April 8th, 2024, all labs will be drawn in the cancer center.  You do not have to check in or register with the main entrance as you have in the past but will complete your check-in in the cancer center.  Wear comfortable clothing and clothing appropriate for easy access to any Portacath or PICC line.   We strive to give you quality time with your provider. You may need to reschedule your appointment if you arrive late (15 or more minutes).  Arriving late affects you and other patients whose appointments are after yours.  Also, if you miss three or more appointments without notifying the office, you may be dismissed from the clinic at the provider's discretion.      For prescription refill requests, have your pharmacy contact our office and allow 72 hours for refills to be completed.    Today you received the following chemotherapy and/or immunotherapy agents Xgeva      To help prevent nausea and vomiting after your treatment, we encourage you to take your nausea medication as directed.  BELOW ARE SYMPTOMS THAT SHOULD BE REPORTED IMMEDIATELY: *FEVER GREATER THAN 100.4 F (38 C) OR HIGHER *CHILLS OR SWEATING *NAUSEA AND VOMITING THAT IS NOT CONTROLLED WITH YOUR NAUSEA MEDICATION *UNUSUAL SHORTNESS OF BREATH *UNUSUAL BRUISING OR BLEEDING *URINARY PROBLEMS (pain or burning when urinating, or frequent urination) *BOWEL PROBLEMS (unusual diarrhea, constipation, pain near the anus) TENDERNESS IN MOUTH AND THROAT WITH OR WITHOUT PRESENCE OF ULCERS (sore throat, sores in mouth, or a toothache) UNUSUAL RASH, SWELLING OR PAIN  UNUSUAL VAGINAL DISCHARGE OR ITCHING   Items with * indicate a potential emergency and should be followed up as  soon as possible or go to the Emergency Department if any problems should occur.  Please show the CHEMOTHERAPY ALERT CARD or IMMUNOTHERAPY ALERT CARD at check-in to the Emergency Department and triage nurse.  Should you have questions after your visit or need to cancel or reschedule your appointment, please contact [facility] [place] - A DEPT OF Eligha Bridegroom [facility] [phone]  and follow the prompts.  Office hours are 8:00 a.m. to 4:30 p.m. Monday - Friday. Please note that voicemails left after 4:00 p.m. may not be returned until the following business day.  We are closed weekends and major holidays. You have access to a nurse at all times for urgent questions. Please call the main number to the clinic [phone] and follow the prompts.  For any non-urgent questions, you may also contact your provider using MyChart. We now offer e-Visits for anyone 65 and older to request care online for non-urgent symptoms. For details visit [email or web address].   Also download the MyChart app! Go to the app store, search "MyChart", open the app, select [place], and log in with your MyChart username and password.

## 2024-05-27 ENCOUNTER — Other Ambulatory Visit: Payer: Self-pay

## 2024-05-27 MED ORDER — REVLIMID 10 MG PO CAPS: 10.0000 mg | ORAL_CAPSULE | Freq: Every day | ORAL | 0 refills | Status: DC

## 2024-05-27 NOTE — Progress Notes (Signed)
 Chart reviewed. Revlimid  refilled per last office note with Dr. Davonna.

## 2024-06-23 ENCOUNTER — Other Ambulatory Visit: Payer: Self-pay

## 2024-06-23 MED ORDER — REVLIMID 10 MG PO CAPS: 10.0000 mg | ORAL_CAPSULE | Freq: Every day | ORAL | 0 refills | Status: DC

## 2024-06-23 NOTE — Progress Notes (Signed)
 Chart reviewed. Revlimid  refilled per last office note with Dr. Davonna.

## 2024-07-13 ENCOUNTER — Encounter: Payer: Self-pay | Admitting: *Deleted

## 2024-07-21 ENCOUNTER — Other Ambulatory Visit: Payer: Self-pay

## 2024-07-21 MED ORDER — REVLIMID 10 MG PO CAPS: 10.0000 mg | ORAL_CAPSULE | Freq: Every day | ORAL | 0 refills | Status: DC

## 2024-07-21 NOTE — Telephone Encounter (Signed)
 Chart reviewed. Revlimid  refilled per last office note with Dr. Davonna.

## 2024-07-28 ENCOUNTER — Encounter: Payer: Self-pay | Admitting: Oncology

## 2024-07-28 ENCOUNTER — Other Ambulatory Visit (HOSPITAL_COMMUNITY): Payer: Self-pay

## 2024-07-28 ENCOUNTER — Telehealth: Payer: Self-pay | Admitting: Pharmacy Technician

## 2024-07-28 NOTE — Telephone Encounter (Signed)
 He had cognitive and physical decline on lenalidomide  and was switched to Revlimid  with no issues.

## 2024-07-28 NOTE — Telephone Encounter (Signed)
 Oral Oncology Patient Advocate Encounter   Re-authorization   Received notification that prior authorization for brand Revlimid  is required.  Member ID: [id number]   PA submitted on CMM via [employer] [id number] Status is pending   Per Paul Senters, RN: He had cognitive and physical decline on lenalidomide  and was switched to Revlimid  with no issues.  [name], CPhT  [facility], [name], [facility] - Oral Chemotherapy Patient Advocate Specialist III Phone: [phone]  Fax: [phone]

## 2024-07-28 NOTE — Telephone Encounter (Signed)
 Oral Oncology Patient Advocate Encounter  Prior Authorization for brand Revlimid  has been approved.    PA# [id number] Effective dates: 07/28/2024 through 07/15/2025  Patient can continue to fill through [facility] specialty pharmacy.   Wei Newbrough (Patty) Chet Burnet, CPhT  [facility], [name], [facility] - Oral Chemotherapy Patient Advocate Specialist III Phone: [phone]  Fax: [phone]

## 2024-07-28 NOTE — Telephone Encounter (Signed)
 Are you aware of the above?

## 2024-08-05 ENCOUNTER — Inpatient Hospital Stay: Attending: Hematology

## 2024-08-05 DIAGNOSIS — C9001 Multiple myeloma in remission: Secondary | ICD-10-CM | POA: Insufficient documentation

## 2024-08-05 LAB — COMPREHENSIVE METABOLIC PANEL WITH GFR
ALT: 18 U/L (ref 0–44)
AST: 18 U/L (ref 15–41)
Albumin: 4.5 g/dL (ref 3.5–5.0)
Alkaline Phosphatase: 87 U/L (ref 38–126)
Anion gap: 15 (ref 5–15)
BUN: 23 mg/dL (ref 8–23)
CO2: 24 mmol/L (ref 22–32)
Calcium: 9.3 mg/dL (ref 8.9–10.3)
Chloride: 101 mmol/L (ref 98–111)
Creatinine, Ser: 1 mg/dL (ref 0.61–1.24)
GFR, Estimated: >60 mL/min
Glucose, Bld: 97 mg/dL (ref 70–99)
Potassium: 4.1 mmol/L (ref 3.5–5.1)
Sodium: 140 mmol/L (ref 135–145)
Total Bilirubin: 1.4 mg/dL — ABNORMAL HIGH (ref 0.0–1.2)
Total Protein: 6.8 g/dL (ref 6.5–8.1)

## 2024-08-05 LAB — MAGNESIUM: Magnesium: 2.1 mg/dL (ref 1.7–2.4)

## 2024-08-05 LAB — CBC WITH DIFFERENTIAL/PLATELET
Abs Immature Granulocytes: 0.03 K/uL (ref 0.00–0.07)
Basophils Absolute: 0.1 K/uL (ref 0.0–0.1)
Basophils Relative: 1 %
Eosinophils Absolute: 0.1 K/uL (ref 0.0–0.5)
Eosinophils Relative: 1 %
HCT: 47.5 % (ref 39.0–52.0)
Hemoglobin: 15.5 g/dL (ref 13.0–17.0)
Immature Granulocytes: 0 %
Lymphocytes Relative: 21 %
Lymphs Abs: 1.7 K/uL (ref 0.7–4.0)
MCH: 27.2 pg (ref 26.0–34.0)
MCHC: 32.6 g/dL (ref 30.0–36.0)
MCV: 83.5 fL (ref 80.0–100.0)
Monocytes Absolute: 1.3 K/uL — ABNORMAL HIGH (ref 0.1–1.0)
Monocytes Relative: 16 %
Neutro Abs: 5.1 K/uL (ref 1.7–7.7)
Neutrophils Relative %: 61 %
Platelets: 172 K/uL (ref 150–400)
RBC: 5.69 MIL/uL (ref 4.22–5.81)
RDW: 15 % (ref 11.5–15.5)
WBC: 8.2 K/uL (ref 4.0–10.5)
nRBC: 0 % (ref 0.0–0.2)

## 2024-08-06 LAB — KAPPA/LAMBDA LIGHT CHAINS
Kappa free light chain: 16.6 mg/L (ref 3.3–19.4)
Kappa, lambda light chain ratio: 1.66 — ABNORMAL HIGH (ref 0.26–1.65)
Lambda free light chains: 10 mg/L (ref 5.7–26.3)

## 2024-08-07 LAB — PROTEIN ELECTROPHORESIS, SERUM
A/G Ratio: 1.6 (ref 0.7–1.7)
Albumin ELP: 3.9 g/dL (ref 2.9–4.4)
Alpha-1-Globulin: 0.2 g/dL (ref 0.0–0.4)
Alpha-2-Globulin: 0.6 g/dL (ref 0.4–1.0)
Beta Globulin: 0.9 g/dL (ref 0.7–1.3)
Gamma Globulin: 0.6 g/dL (ref 0.4–1.8)
Globulin, Total: 2.4 g/dL (ref 2.2–3.9)
M-Spike, %: 0.2 g/dL — ABNORMAL HIGH
Total Protein ELP: 6.3 g/dL (ref 6.0–8.5)

## 2024-08-07 LAB — IMMUNOFIXATION ELECTROPHORESIS
IgA: 68 mg/dL (ref 61–437)
IgG (Immunoglobin G), Serum: 651 mg/dL (ref 603–1613)
IgM (Immunoglobulin M), Srm: 26 mg/dL (ref 20–172)
Total Protein ELP: 6.4 g/dL (ref 6.0–8.5)

## 2024-08-12 ENCOUNTER — Inpatient Hospital Stay

## 2024-08-12 ENCOUNTER — Inpatient Hospital Stay: Admitting: Oncology

## 2024-08-12 VITALS — BP 203/88 | HR 93 | Temp 97.7°F | Resp 19 | Wt 192.0 lb

## 2024-08-12 DIAGNOSIS — C9001 Multiple myeloma in remission: Secondary | ICD-10-CM

## 2024-08-12 DIAGNOSIS — C9000 Multiple myeloma not having achieved remission: Secondary | ICD-10-CM

## 2024-08-12 DIAGNOSIS — C7951 Secondary malignant neoplasm of bone: Secondary | ICD-10-CM | POA: Diagnosis not present

## 2024-08-12 MED ORDER — DENOSUMAB 120 MG/1.7ML ~~LOC~~ SOLN
120.0000 mg | Freq: Once | SUBCUTANEOUS | Status: AC
  Administered 2024-08-12: 120 mg via SUBCUTANEOUS
  Filled 2024-08-12: qty 1.7

## 2024-08-12 NOTE — Patient Instructions (Signed)
 Denosumab  Injection (Oncology) What is this medication? DENOSUMAB  (den oh SUE mab) prevents weakened bones caused by cancer. It may also be used to treat noncancerous bone tumors that cannot be removed by surgery. It can also be used to treat high calcium  levels in the blood caused by cancer. It works by blocking a protein that causes bones to break down quickly. This slows down the release of calcium  from bones, which lowers calcium  levels in your blood. It also makes your bones stronger and less likely to break (fracture). This medicine may be used for other purposes; ask your health care provider or pharmacist if you have questions. COMMON BRAND NAME(S): XGEVA  What should I tell my care team before I take this medication? They need to know if you have any of these conditions: Dental disease Having surgery or tooth extraction Infection Kidney disease Low levels of calcium  or vitamin D  in the blood Malnutrition On hemodialysis Skin conditions or sensitivity Thyroid  or parathyroid disease An unusual reaction to denosumab , other medications, foods, dyes, or preservatives Pregnant or trying to get pregnant Breast-feeding How should I use this medication? This medication is for injection under the skin. It is given by your care team in a hospital or clinic setting. A special MedGuide will be given to you before each treatment. Be sure to read this information carefully each time. Talk to your care team about the use of this medication in children. While it may be prescribed for children as young as 13 years for selected conditions, precautions do apply. Overdosage: If you think you have taken too much of this medicine contact a poison control center or emergency room at once. NOTE: This medicine is only for you. Do not share this medicine with others. What if I miss a dose? Keep appointments for follow-up doses. It is important not to miss your dose. Call your care team if you are unable to  keep an appointment. What may interact with this medication? Do not take this medication with any of the following: Other medications containing denosumab  This medication may also interact with the following: Medications that lower your chance of fighting infection Steroid medications, such as prednisone  or cortisone This list may not describe all possible interactions. Give your health care provider a list of all the medicines, herbs, non-prescription drugs, or dietary supplements you use. Also tell them if you smoke, drink alcohol, or use illegal drugs. Some items may interact with your medicine. What should I watch for while using this medication? Your condition will be monitored carefully while you are receiving this medication. You may need blood work while taking this medication. This medication may increase your risk of getting an infection. Call your care team for advice if you get a fever, chills, sore throat, or other symptoms of a cold or flu. Do not treat yourself. Try to avoid being around people who are sick. You should make sure you get enough calcium  and vitamin D  while you are taking this medication, unless your care team tells you not to. Discuss the foods you eat and the vitamins you take with your care team. Some people who take this medication have severe bone, joint, or muscle pain. This medication may also increase your risk for jaw problems or a broken thigh bone. Tell your care team right away if you have severe pain in your jaw, bones, joints, or muscles. Tell your care team if you have any pain that does not go away or that gets worse. Talk  to your care team if you may be pregnant. Serious birth defects can occur if you take this medication during pregnancy and for 5 months after the last dose. You will need a negative pregnancy test before starting this medication. Contraception is recommended while taking this medication and for 5 months after the last dose. Your care team  can help you find the option that works for you. What side effects may I notice from receiving this medication? Side effects that you should report to your care team as soon as possible: Allergic reactions--skin rash, itching, hives, swelling of the face, lips, tongue, or throat Bone, joint, or muscle pain Low calcium  level--muscle pain or cramps, confusion, tingling, or numbness in the hands or feet Osteonecrosis of the jaw--pain, swelling, or redness in the mouth, numbness of the jaw, poor healing after dental work, unusual discharge from the mouth, visible bones in the mouth Side effects that usually do not require medical attention (report to your care team if they continue or are bothersome): Cough Diarrhea Fatigue Headache Nausea This list may not describe all possible side effects. Call your doctor for medical advice about side effects. You may report side effects to FDA at 1-800-FDA-1088. Where should I keep my medication? This medication is given in a hospital or clinic. It will not be stored at home. NOTE: This sheet is a summary. It may not cover all possible information. If you have questions about this medicine, talk to your doctor, pharmacist, or health care provider.  2024 Elsevier/Gold Standard (2021-11-22 00:00:00)

## 2024-08-12 NOTE — Progress Notes (Signed)
 " Patient Care Team: Rogers Hai, MD (Inactive) as PCP - General (Hematology)  Clinic Day:  02/27/2024    CHIEF COMPLAINT:  CC: IgG kappa Multiple Myeloma in remission   ASSESSMENT & PLAN:   Assessment & Plan: Paul Norton  is a 69 y.o. male with IgG kappa Multiple Myeloma in remission   Multiple myeloma in remission IgG kappa multiple myeloma in remission diagnosed in 2022 R-ISS stage III -beta-2 microglobulin: 18.7, albumin: 2.0, LDH: Normal FISH panel positive for 1 p-,-13,14q-,16q-,17p-/-17 and  20q- Cytogenetics-no metaphases available for analysis. 11/21/2020- 03/07/2021: Completed 6 cycles of Dara VRD Patient declined bone marrow transplantation 03/27/2021: On maintenance Revlimid  and monthly daratumumab .  Daratumumab  discontinued 12/05/2023.  Currently on Revlimid  10 mg 3 weeks on/1 week off   - Patient is very compliant with Revlimid .  Expresses no complaints from taking it. - Denies fatigue, diarrhea, constipation, nausea, vomiting, cough, shortness of breath, rash. - Labs reviewed today: CMP: Stable with no hypercalcemia, CBC: No anemia or thrombocytopenia, SPEP: M spike: 0.2 (detectable now),Normal IgG, IgM, IgA, normal free light chains with slightly elevated ratio (<2), IFE: IG monoclonal protein with lambda light chain specificity - Continue Revlimid  10 mg 3 weeks on/1 week off.  Continue low-dose aspirin .   Return to clinic in 3 months with labs.  Metastasis to bone Patient had multiple lytic bone lesions at the time of diagnosis of multiple myeloma   - Continue denosumab  every 3 months. Due today - Continue calcium  and vitamin D   Antiviral prophylaxis Discontinued acyclovir  at last visit after 3 months of completion of daratumumab    The patient understands the plans discussed today and is in agreement with them.  He knows to contact our office if he develops concerns prior to his next appointment.  The total time spent in the appointment  was 16 minutes for the encounter  with patient, including review of chart and various tests results, discussions about plan of care and coordination of care plan    [name], MD  [facility] [place] - A DEPT OF [facility] [address] [place] [address] Dept: [phone] Dept Fax: [phone]   Orders Placed This Encounter  Procedures   CBC with Differential    Standing Status:   Future    Expected Date:   11/03/2024    Expiration Date:   02/01/2025   Comprehensive metabolic panel    Standing Status:   Future    Expected Date:   11/03/2024    Expiration Date:   02/01/2025   Kappa/lambda light chains    Standing Status:   Future    Expected Date:   11/03/2024    Expiration Date:   02/01/2025   Immunofixation electrophoresis    Standing Status:   Future    Expected Date:   11/03/2024    Expiration Date:   02/01/2025   Protein electrophoresis, serum    Standing Status:   Future    Expected Date:   11/03/2024    Expiration Date:   02/01/2025     ONCOLOGY HISTORY:   I have reviewed his chart and materials related to his cancer extensively and collaborated history with the patient. Summary of oncologic history is as follows:   IgG kappa Multiple Myeloma in remission:  -Admitted to the hospital with hypercalcemia and hemoglobin of 3.9 and was found to have multiple lytic bone lesions -11/01/2020: Bone marrow, aspirate, clot, core: Hypercellular bone marrow with  plasma cell neoplasm.  Extensive infiltration by atypical plasma cells representing 85% of all cells in the aspirate associated with prominent interstitial infiltrates.  The plasma cells display kappa light chain restriction consistent with plasma cell neoplasm.  - Multiple myeloma FISH: 13 q. 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   - Beta-2  microglobulin: 18.7, albumin: 1.8, LDH: Normal - SPEP: M spike: 5.4, IgG: 7394, IgM: 15, IgA: 18, 24 hour total urine protein: 3.6g, free kappa light chain: Very high, free lambda light chain: 11, ratio: 1030.53.  IFE: Immunofixation shows IgG monoclonal protein with kappa light chain specificity and monoclonal free kappa light chains. - CRAB: Hemoglobin: 3.9, calcium : 14.2,Creatinine:4.50 - CT CAP: Extensive lytic foci and bony remodeling throughout the axial and appendicular skeleton of the chest, abdomen and pelvis including the proximal femur and humeri.  Bilateral multiple rib fractures. - R-ISS stage III -11/21/2020-03/07/2021: 6 cycles of Dara VRD -Patient declined bone marrow transplant -01/24/2021-Current: Denosumab  monthly. Changed to every 3 months 02/27/2024 -03/21/2021-12/05/2023: Daratumumab  + Revlimid  -12/05/2023- Current: Revlimid  10 mg 3 weeks on/1 week off  Current Treatment:  Revlimid  10mg  3 weeks on/1 week off  INTERVAL HISTORY:   Discussed the use of AI scribe software for clinical note transcription with the patient, who gave verbal consent to proceed.  History of Present Illness Paul Norton is a 69 year old male with multiple myeloma in remission who presents for routine hematology/oncology follow-up.  He remains on daily lenalidomide  therapy without missed doses.  He denies bone pain, increased fatigue, nausea, or vomiting. He reports overall well-being and maintains an active lifestyle, including regular exercise with weights and an exercise ball, regardless of weather. He rises early and has not experienced any new or concerning symptoms since his last visit.  He expresses no concerns regarding his current treatment regimen. He inquired about mask requirements in the clinic and was reassured regarding current protocols.    I have reviewed the past medical history, past surgical history, social history and family history with the patient and they  are unchanged from previous note.  ALLERGIES:  has no known allergies.  MEDICATIONS:  Current Outpatient Medications  Medication Sig Dispense Refill   acetaminophen  (TYLENOL ) 325 MG tablet Take 2 tablets (650 mg total) by mouth every 6 (six) hours as needed for mild pain (or Fever >/= 101). 30 tablet 30   acyclovir  (ZOVIRAX ) 400 MG tablet Take 1 tablet (400 mg total) by mouth 2 (two) times daily. 60 tablet 6   amLODipine  (NORVASC ) 10 MG tablet Take 1 tablet (10 mg total) by mouth daily. 90 tablet 3   aspirin  81 MG chewable tablet Chew by mouth daily.     feeding supplement (ENSURE ENLIVE / ENSURE PLUS) LIQD Take 237 mLs by mouth 3 (three) times daily between meals. 237 mL 12   OYSCO 500 + D 500-5 MG-MCG TABS TAKE ONE TABLET BY MOUTH DAILY 30 tablet 3   REVLIMID  10 MG capsule Take 1 capsule (10 mg total) by mouth daily. 21 days on, 7 days off 21 capsule 0   No current facility-administered medications for this visit.    VITALS:  Blood pressure (!) 203/88, pulse 93, temperature 97.7 F (36.5 C), temperature source Tympanic, resp. rate 19, weight 192 lb (87.1 kg), SpO2 100%.  Wt Readings from Last 3 Encounters:  08/12/24 192 lb (87.1 kg)  05/20/24 187 lb (84.8 kg)  02/27/24 189  lb (85.7 kg)    Body mass index is 27.16 kg/m.  Performance status (ECOG): 1 - Symptomatic but completely ambulatory  PHYSICAL EXAM:   GENERAL:alert, no distress and comfortable SKIN: skin color, texture, turgor are normal, no rashes or significant lesions LYMPH:  no palpable lymphadenopathy in the cervical, axillary or inguinal LUNGS: clear to auscultation and percussion with normal breathing effort HEART: regular rate & rhythm and no murmurs and no lower extremity edema ABDOMEN:abdomen soft, non-tender and normal bowel sounds Musculoskeletal:no cyanosis of digits and no clubbing  NEURO: alert & oriented x 3 with fluent speech   LABORATORY DATA:  I have reviewed the data as listed    Component Value  Date/Time   NA 140 08/05/2024 1327   NA 138 03/31/2020 1305   K 4.1 08/05/2024 1327   CL 101 08/05/2024 1327   CO2 24 08/05/2024 1327   GLUCOSE 97 08/05/2024 1327   BUN 23 08/05/2024 1327   BUN 19 03/31/2020 1305   CREATININE 1.00 08/05/2024 1327   CREATININE 1.03 04/11/2017 1114   CALCIUM  9.3 08/05/2024 1327   CALCIUM  12.4 (H) 10/30/2020 1741   PROT 6.8 08/05/2024 1327   PROT 10.5 (HH) 03/31/2020 1305   PROT 10.4 (HH) 03/31/2020 1305   ALBUMIN 4.5 08/05/2024 1327   ALBUMIN 3.6 (L) 03/31/2020 1305   AST 18 08/05/2024 1327   ALT 18 08/05/2024 1327   ALKPHOS 87 08/05/2024 1327   BILITOT 1.4 (H) 08/05/2024 1327   BILITOT 0.9 03/31/2020 1305   GFRNONAA >60 08/05/2024 1327   GFRAA 72 03/31/2020 1305     Lab Results  Component Value Date   WBC 8.2 08/05/2024   NEUTROABS 5.1 08/05/2024   HGB 15.5 08/05/2024   HCT 47.5 08/05/2024   MCV 83.5 08/05/2024   PLT 172 08/05/2024      Chemistry      Component Value Date/Time   NA 140 08/05/2024 1327   NA 138 03/31/2020 1305   K 4.1 08/05/2024 1327   CL 101 08/05/2024 1327   CO2 24 08/05/2024 1327   BUN 23 08/05/2024 1327   BUN 19 03/31/2020 1305   CREATININE 1.00 08/05/2024 1327   CREATININE 1.03 04/11/2017 1114      Component Value Date/Time   CALCIUM  9.3 08/05/2024 1327   CALCIUM  12.4 (H) 10/30/2020 1741   ALKPHOS 87 08/05/2024 1327   AST 18 08/05/2024 1327   ALT 18 08/05/2024 1327   BILITOT 1.4 (H) 08/05/2024 1327   BILITOT 0.9 03/31/2020 1305      Latest Reference Range & Units 08/05/24 13:26  Total Protein ELP 6.0 - 8.5 g/dL 6.0 - 8.5 g/dL 6.3 6.4  Albumin ELP 2.9 - 4.4 g/dL 3.9  Globulin, Total 2.2 - 3.9 g/dL 2.4 (C)  A/G Ratio 0.7 - 1.7  1.6 (C)  Alpha-1-Globulin 0.0 - 0.4 g/dL 0.2  Joeyj-7-Honalopw 0.4 - 1.0 g/dL 0.6  Beta Globulin 0.7 - 1.3 g/dL 0.9  Gamma Globulin 0.4 - 1.8 g/dL 0.6  M-SPIKE, % Not Observed g/dL 0.2 (H)  SPE Interp.  Comment  Comment  Comment  IgG (Immunoglobin G), Serum 603 -  1,613 mg/dL 348  IgM (Immunoglobulin M), Srm 20 - 172 mg/dL 26  IgA 61 - 562 mg/dL 68  (H): Data is abnormally high (C): Corrected   Latest Reference Range & Units 08/05/24 13:26 08/05/24 13:27  Kappa free light chain 3.3 - 19.4 mg/L  16.6  Lambda free light chains 5.7 - 26.3 mg/L  10.0  Kappa,  lambda light chain ratio 0.26 - 1.65   1.66 (H)  Immunofixation Result, Serum  Immunofixation shows IgG monoclonal protein with lambda light chain  specificity.    (H): Data is abnormally high !: Data is abnormal (C): Corrected   RADIOGRAPHIC STUDIES: I have personally reviewed the radiological images as listed and agreed with the findings in the report.  None new to review   "

## 2024-08-12 NOTE — Progress Notes (Signed)
Patient is taking Revlimid as prescribed.  He has not missed any doses and reports no side effects at this time.   

## 2024-08-12 NOTE — Progress Notes (Signed)
 Labs reviewed, Ca 9.3. Patient tolerated Xgeva  injection with no complaints voiced.  Site clean and dry with no bruising or swelling noted at site.  See MAR for details.  Band aid applied.  Patient stable during and after injection.  Vss with discharge and left in satisfactory condition with no s/s of distress noted. All follow ups as scheduled.   Shelsy Seng

## 2024-08-12 NOTE — Patient Instructions (Addendum)
 [facility] Discharge Instructions   You were seen and examined today by Dr. Davonna.  She reviewed the results of your lab work which are normal/stable.   Continue Revlimid  as prescribed.   We will see you back in 3 months. We will repeat labs prior to this visit.    Return as scheduled.    Thank you for choosing [facility] at [facility] to provide your oncology and hematology care.  To afford each patient quality time with our provider, please arrive at least 15 minutes before your scheduled appointment time.   If you have a lab appointment with the Cancer Center please come in thru the Main Entrance and check in at the main information desk.  You need to re-schedule your appointment should you arrive 10 or more minutes late.  We strive to give you quality time with our providers, and arriving late affects you and other patients whose appointments are after yours.  Also, if you no show three or more times for appointments you may be dismissed from the clinic at the providers discretion.     Again, thank you for choosing [facility].  Our hope is that these requests will decrease the amount of time that you wait before being seen by our physicians.       _____________________________________________________________  Should you have questions after your visit to [facility], please contact our office at [phone] and follow the prompts.  Our office hours are 8:00 a.m. and 4:30 p.m. Monday - Friday.  Please note that voicemails left after 4:00 p.m. may not be returned until the following business day.  We are closed weekends and major holidays.  You do have access to a nurse 24-7, just call the main number to the clinic [phone] and do not press any options, hold on the line and a nurse will answer the phone.    For prescription refill requests, have your pharmacy contact our office and allow 72  hours.    Due to Covid, you will need to wear a mask upon entering the hospital. If you do not have a mask, a mask will be given to you at the Main Entrance upon arrival. For doctor visits, patients may have 1 support person age 31 or older with them. For treatment visits, patients can not have anyone with them due to social distancing guidelines and our immunocompromised population.

## 2024-08-19 ENCOUNTER — Other Ambulatory Visit: Payer: Self-pay

## 2024-08-19 MED ORDER — REVLIMID 10 MG PO CAPS: 10.0000 mg | ORAL_CAPSULE | Freq: Every day | ORAL | 0 refills | Status: AC

## 2024-08-19 NOTE — Telephone Encounter (Signed)
 Chart reviewed. Revlimid  refilled per last office note with Dr. Davonna.

## 2024-10-28 ENCOUNTER — Inpatient Hospital Stay

## 2024-11-04 ENCOUNTER — Inpatient Hospital Stay

## 2024-11-04 ENCOUNTER — Inpatient Hospital Stay: Admitting: Oncology
